# Patient Record
Sex: Female | Born: 1960 | Race: White | Hispanic: No | Marital: Married | Smoking: Never smoker
Health system: Southern US, Community
[De-identification: ages and names within clinical notes are randomized; demographics above are authoritative.]

## PROBLEM LIST (undated history)

## (undated) DIAGNOSIS — H539 Unspecified visual disturbance: Secondary | ICD-10-CM

## (undated) DIAGNOSIS — A6920 Lyme disease, unspecified: Secondary | ICD-10-CM

## (undated) DIAGNOSIS — G44009 Cluster headache syndrome, unspecified, not intractable: Secondary | ICD-10-CM

## (undated) DIAGNOSIS — G709 Myoneural disorder, unspecified: Secondary | ICD-10-CM

## (undated) DIAGNOSIS — G35 Multiple sclerosis: Secondary | ICD-10-CM

## (undated) DIAGNOSIS — M1990 Unspecified osteoarthritis, unspecified site: Secondary | ICD-10-CM

## (undated) DIAGNOSIS — G35D Multiple sclerosis, unspecified: Secondary | ICD-10-CM

## (undated) DIAGNOSIS — K219 Gastro-esophageal reflux disease without esophagitis: Secondary | ICD-10-CM

## (undated) DIAGNOSIS — H269 Unspecified cataract: Secondary | ICD-10-CM

## (undated) DIAGNOSIS — D649 Anemia, unspecified: Secondary | ICD-10-CM

## (undated) DIAGNOSIS — E039 Hypothyroidism, unspecified: Secondary | ICD-10-CM

## (undated) HISTORY — DX: Multiple sclerosis: G35

## (undated) HISTORY — DX: Lyme disease, unspecified: A69.20

## (undated) HISTORY — DX: Multiple sclerosis, unspecified: G35.D

## (undated) HISTORY — DX: Hypothyroidism, unspecified: E03.9

## (undated) HISTORY — DX: Gastro-esophageal reflux disease without esophagitis: K21.9

## (undated) HISTORY — DX: Unspecified osteoarthritis, unspecified site: M19.90

## (undated) HISTORY — DX: Cluster headache syndrome, unspecified, not intractable: G44.009

## (undated) HISTORY — PX: TUBAL LIGATION: SHX77

## (undated) HISTORY — DX: Unspecified visual disturbance: H53.9

## (undated) HISTORY — DX: Unspecified cataract: H26.9

---

## 1994-01-09 HISTORY — PX: KNEE ARTHROSCOPY: SUR90

## 1997-11-30 ENCOUNTER — Other Ambulatory Visit: Admission: RE | Admit: 1997-11-30 | Discharge: 1997-11-30 | Payer: Self-pay | Admitting: Gynecology

## 2000-01-10 HISTORY — PX: BREAST SURGERY: SHX581

## 2001-01-09 HISTORY — PX: AUGMENTATION MAMMAPLASTY: SUR837

## 2001-09-05 ENCOUNTER — Other Ambulatory Visit: Admission: RE | Admit: 2001-09-05 | Discharge: 2001-09-05 | Payer: Self-pay | Admitting: Gynecology

## 2003-03-02 ENCOUNTER — Other Ambulatory Visit: Admission: RE | Admit: 2003-03-02 | Discharge: 2003-03-02 | Payer: Self-pay | Admitting: Gynecology

## 2003-03-30 ENCOUNTER — Ambulatory Visit (HOSPITAL_COMMUNITY): Admission: RE | Admit: 2003-03-30 | Discharge: 2003-03-30 | Payer: Self-pay | Admitting: Gynecology

## 2004-07-05 ENCOUNTER — Other Ambulatory Visit: Admission: RE | Admit: 2004-07-05 | Discharge: 2004-07-05 | Payer: Self-pay | Admitting: Gynecology

## 2005-01-31 ENCOUNTER — Encounter (INDEPENDENT_AMBULATORY_CARE_PROVIDER_SITE_OTHER): Payer: Self-pay | Admitting: Specialist

## 2005-01-31 ENCOUNTER — Ambulatory Visit (HOSPITAL_COMMUNITY): Admission: RE | Admit: 2005-01-31 | Discharge: 2005-01-31 | Payer: Self-pay | Admitting: Neurology

## 2005-02-02 ENCOUNTER — Encounter: Admission: RE | Admit: 2005-02-02 | Discharge: 2005-02-02 | Payer: Self-pay | Admitting: Neurology

## 2005-07-06 ENCOUNTER — Other Ambulatory Visit: Admission: RE | Admit: 2005-07-06 | Discharge: 2005-07-06 | Payer: Self-pay | Admitting: Gynecology

## 2005-10-02 ENCOUNTER — Encounter: Admission: RE | Admit: 2005-10-02 | Discharge: 2005-10-02 | Payer: Self-pay | Admitting: Gynecology

## 2006-01-09 HISTORY — PX: COLONOSCOPY: SHX174

## 2006-03-03 ENCOUNTER — Encounter: Admission: RE | Admit: 2006-03-03 | Discharge: 2006-03-03 | Payer: Self-pay | Admitting: Neurology

## 2007-02-18 ENCOUNTER — Other Ambulatory Visit: Admission: RE | Admit: 2007-02-18 | Discharge: 2007-02-18 | Payer: Self-pay | Admitting: Gynecology

## 2007-05-07 ENCOUNTER — Encounter: Admission: RE | Admit: 2007-05-07 | Discharge: 2007-05-07 | Payer: Self-pay | Admitting: Gynecology

## 2007-05-17 ENCOUNTER — Encounter: Admission: RE | Admit: 2007-05-17 | Discharge: 2007-05-17 | Payer: Self-pay | Admitting: Gynecology

## 2007-11-14 ENCOUNTER — Ambulatory Visit: Payer: Self-pay | Admitting: Gynecology

## 2008-05-04 ENCOUNTER — Ambulatory Visit: Payer: Self-pay | Admitting: Gynecology

## 2008-05-04 ENCOUNTER — Other Ambulatory Visit: Admission: RE | Admit: 2008-05-04 | Discharge: 2008-05-04 | Payer: Self-pay | Admitting: Gynecology

## 2008-05-04 ENCOUNTER — Encounter: Payer: Self-pay | Admitting: Gynecology

## 2008-05-08 ENCOUNTER — Ambulatory Visit: Payer: Self-pay | Admitting: Gynecology

## 2008-05-18 ENCOUNTER — Ambulatory Visit: Payer: Self-pay | Admitting: Gynecology

## 2008-06-02 ENCOUNTER — Ambulatory Visit: Payer: Self-pay | Admitting: Gynecology

## 2008-06-26 ENCOUNTER — Ambulatory Visit: Payer: Self-pay | Admitting: Gynecology

## 2008-07-03 ENCOUNTER — Encounter: Admission: RE | Admit: 2008-07-03 | Discharge: 2008-07-03 | Payer: Self-pay | Admitting: *Deleted

## 2008-07-20 ENCOUNTER — Ambulatory Visit: Payer: Self-pay | Admitting: Gynecology

## 2008-08-10 ENCOUNTER — Encounter: Payer: Self-pay | Admitting: Infectious Diseases

## 2008-08-28 ENCOUNTER — Encounter: Admission: RE | Admit: 2008-08-28 | Discharge: 2008-08-28 | Payer: Self-pay | Admitting: Gynecology

## 2008-09-01 ENCOUNTER — Ambulatory Visit: Payer: Self-pay | Admitting: Infectious Diseases

## 2008-09-01 DIAGNOSIS — N760 Acute vaginitis: Secondary | ICD-10-CM | POA: Insufficient documentation

## 2008-09-01 DIAGNOSIS — G35 Multiple sclerosis: Secondary | ICD-10-CM | POA: Insufficient documentation

## 2008-09-01 DIAGNOSIS — G35D Multiple sclerosis, unspecified: Secondary | ICD-10-CM | POA: Insufficient documentation

## 2008-09-01 LAB — CONVERTED CEMR LAB
BUN: 10 mg/dL (ref 6–23)
Basophils Absolute: 0 10*3/uL (ref 0.0–0.1)
Basophils Relative: 1 % (ref 0–1)
Calcium: 9.4 mg/dL (ref 8.4–10.5)
Eosinophils Relative: 1 % (ref 0–5)
Glucose, Bld: 94 mg/dL (ref 70–99)
Hemoglobin, Urine: NEGATIVE
Hemoglobin: 12.8 g/dL (ref 12.0–15.0)
Ketones, ur: NEGATIVE mg/dL
MCHC: 32.5 g/dL (ref 30.0–36.0)
Monocytes Absolute: 0.7 10*3/uL (ref 0.1–1.0)
Neutro Abs: 2.7 10*3/uL (ref 1.7–7.7)
Nitrite: NEGATIVE
Platelets: 258 10*3/uL (ref 150–400)
RDW: 14.5 % (ref 11.5–15.5)
Sodium: 141 meq/L (ref 135–145)
Urobilinogen, UA: 0.2 (ref 0.0–1.0)

## 2008-09-02 ENCOUNTER — Encounter: Admission: RE | Admit: 2008-09-02 | Discharge: 2008-09-02 | Payer: Self-pay | Admitting: Gynecology

## 2008-09-08 ENCOUNTER — Telehealth: Payer: Self-pay | Admitting: Infectious Diseases

## 2010-01-30 ENCOUNTER — Encounter: Payer: Self-pay | Admitting: Gynecology

## 2010-01-31 ENCOUNTER — Encounter: Payer: Self-pay | Admitting: *Deleted

## 2010-05-27 NOTE — H&P (Signed)
Samantha Church, Samantha Church                  ACCOUNT NO.:  [id number]   MEDICAL RECORD NO.:  [id number]          PATIENT TYPE:  OUT   LOCATION:  [facility]                          FACILITY:  [facility]   PHYSICIAN:  Catherine A. Orlin Hilding, M.D.DATE OF BIRTH:  Jul 19, 1960   DATE OF ADMISSION:  01/31/2005  DATE OF DISCHARGE:                                HISTORY & PHYSICAL   PROCEDURE:  Lumbar puncture.   CHIEF COMPLAINT:  Numbness.   HISTORY OF PRESENT ILLNESS:  [name] is a 50 year old right-handed white  woman with right-sided numbness. She had an MRI scan of the brain which  showed a few scattered lesions and MRI of the cervical spine which showed a  left posterolateral cord lesion at C3-4, question of MS versus syrinx versus  tumor. MRI of the thoracic spine was normal. The primary concern is MS.   PAST MEDICAL HISTORY:  Unremarkable. She takes no medicines routinely.   ALLERGIES:  None.   SOCIAL HISTORY:  She is married with children.   FAMILY HISTORY:  Noncontributory.   EXAMINATION:  She is awake and alert, appropriate, with normal cranial  nerves, normal motor exam. Reflexes are normal, coordination is normal. She  has decreased sensation in the right torso to pinprick.   IMPRESSION:  Sensory loss in the right thorax with abnormal MRI in the brain  and cervical spine suspicious for multiple sclerosis; also need to rule out  tumor.   PLAN:  Lumbar puncture for CSF.   HOSPITAL COURSE:  The patient had the procedure without any complications  and is discharged home after 30 minutes in Trendelenburg.      Catherine A. Orlin Hilding, M.D.  Electronically Signed     CAW/MEDQ  D:  01/31/2005  T:  01/31/2005  Job:  [id number]

## 2010-05-27 NOTE — Op Note (Signed)
Samantha Church, Samantha Church                  ACCOUNT NO.:  [id number]   MEDICAL RECORD NO.:  [id number]          PATIENT TYPE:  OUT   LOCATION:  [facility]                          FACILITY:  [facility]   PHYSICIAN:  Catherine A. Orlin Hilding, M.D.DATE OF BIRTH:  03-27-1960   DATE OF PROCEDURE:  01/31/2005  DATE OF DISCHARGE:                                 OPERATIVE REPORT   PROCEDURE:  Lumbar puncture.   INDICATIONS FOR PROCEDURE:  Diagnostic.   The procedure was explained along with risks of headache and potential  benefits of a possible diagnosis.  Consent was obtained.  The patient was  sterilely prepped and draped and positioned in the usual fashion in the left  lateral decubitus position, anesthetized at the L3-L4 level with 1%  Xylocaine.  The thecal sac was entered without difficulty.  Opening pressure  was 16 Tor.  Approximately 14 mL clear, colorless fluid was obtained and  sent to the lab  for cell count, differential, protein, glucose, VDRL, ACE  level, cytology, gram stain, cultures, oligoclonal bands, and IGG albumin  index.  The patient tolerated the procedure well without any immediate  complications.  She is to lie in Trendelenburg for 30 minutes and then may  be discharged home.      Catherine A. Orlin Hilding, M.D.  Electronically Signed     CAW/MEDQ  D:  01/31/2005  T:  01/31/2005  Job:  [id number]

## 2010-10-20 ENCOUNTER — Other Ambulatory Visit: Payer: Self-pay | Admitting: Obstetrics and Gynecology

## 2010-10-20 DIAGNOSIS — N644 Mastodynia: Secondary | ICD-10-CM

## 2010-11-02 ENCOUNTER — Ambulatory Visit
Admission: RE | Admit: 2010-11-02 | Discharge: 2010-11-02 | Disposition: A | Discharge status: Home / Self Care | Payer: BC Managed Care – PPO | Source: Ambulatory Visit | Attending: Obstetrics and Gynecology | Admitting: Obstetrics and Gynecology

## 2010-11-02 ENCOUNTER — Other Ambulatory Visit: Payer: Self-pay | Admitting: Obstetrics and Gynecology

## 2010-11-02 DIAGNOSIS — N644 Mastodynia: Secondary | ICD-10-CM

## 2010-11-24 LAB — BASIC METABOLIC PANEL
BUN: 10 (ref 4–21)
Creatinine: 0.8 (ref 0.5–1.1)
GLUCOSE: 83

## 2010-11-24 LAB — LIPID PANEL
Cholesterol: 190 (ref 0–200)
HDL: 52 (ref 35–70)
LDL CALC: 138
LDL/HDL RATIO: 3.7
TRIGLYCERIDES: 69 (ref 40–160)

## 2010-11-24 LAB — HEPATIC FUNCTION PANEL
ALK PHOS: 41 (ref 25–125)
ALT: 14 (ref 7–35)
AST: 16 (ref 13–35)
BILIRUBIN, TOTAL: 0.6

## 2010-11-24 LAB — CBC AND DIFFERENTIAL
HEMATOCRIT: 38 (ref 36–46)
HEMOGLOBIN: 13.2 (ref 12.0–16.0)
Platelets: 217 (ref 150–399)
WBC: 7.4

## 2010-11-24 LAB — TSH: TSH: 1.87 (ref 0.41–5.90)

## 2011-07-17 ENCOUNTER — Ambulatory Visit (HOSPITAL_COMMUNITY)
Admission: RE | Admit: 2011-07-17 | Discharge: 2011-07-17 | Disposition: A | Discharge status: Home / Self Care | Payer: BC Managed Care – PPO | Source: Ambulatory Visit | Attending: Gastroenterology | Admitting: Gastroenterology

## 2011-07-17 ENCOUNTER — Other Ambulatory Visit: Payer: Self-pay | Admitting: Gastroenterology

## 2011-07-17 DIAGNOSIS — Q438 Other specified congenital malformations of intestine: Secondary | ICD-10-CM

## 2011-07-17 DIAGNOSIS — K562 Volvulus: Secondary | ICD-10-CM | POA: Insufficient documentation

## 2012-03-08 ENCOUNTER — Other Ambulatory Visit: Payer: Self-pay

## 2012-03-08 DIAGNOSIS — Z1231 Encounter for screening mammogram for malignant neoplasm of breast: Secondary | ICD-10-CM

## 2012-04-03 ENCOUNTER — Ambulatory Visit
Admission: RE | Admit: 2012-04-03 | Discharge: 2012-04-03 | Disposition: A | Discharge status: Home / Self Care | Payer: 59 | Source: Ambulatory Visit

## 2012-04-03 DIAGNOSIS — Z1231 Encounter for screening mammogram for malignant neoplasm of breast: Secondary | ICD-10-CM

## 2012-09-27 ENCOUNTER — Encounter (HOSPITAL_COMMUNITY): Payer: Self-pay | Admitting: Pharmacy Technician

## 2012-10-07 ENCOUNTER — Encounter (HOSPITAL_COMMUNITY)
Admission: RE | Admit: 2012-10-07 | Discharge: 2012-10-07 | Disposition: A | Discharge status: Home / Self Care | Payer: 59 | Source: Ambulatory Visit | Attending: Obstetrics and Gynecology | Admitting: Obstetrics and Gynecology

## 2012-10-07 ENCOUNTER — Encounter (HOSPITAL_COMMUNITY): Payer: Self-pay

## 2012-10-07 HISTORY — DX: Myoneural disorder, unspecified: G70.9

## 2012-10-07 HISTORY — DX: Anemia, unspecified: D64.9

## 2012-10-07 LAB — CBC
HCT: 37.7 % (ref 36.0–46.0)
MCH: 29.8 pg (ref 26.0–34.0)
MCV: 87.9 fL (ref 78.0–100.0)
Platelets: 219 10*3/uL (ref 150–400)
RBC: 4.29 MIL/uL (ref 3.87–5.11)

## 2012-10-07 NOTE — H&P (Addendum)
53 yo presents for surgical mngt of irregular vb.  Suspect endometrial mass by [place].  PMHx:  Hypothyroidism, MS PSHx:  SVD x 4 All:  NKA Meds:  Tecfidera, vitamins, levothyroid SHx:  Negative tobacco  AF, VSS Gen - NAD ABd - soft, NT CV - RRR Lungs - clear PV - uterus mobile, NT.  No adnexal tenderness  [place]:  Ultrasound today shows a 2.3 cm mass within the endometrial cavity.  Resolving right ovarian cyst is noted and a small amount of fluid noted in the cul-de-sac.  A/P:  Irregular VB Hysteroscopy, D&C, resection of endometrial mass R/b/a discussed, informed consent

## 2012-10-07 NOTE — Patient Instructions (Addendum)
Your procedure is scheduled on:10/09/12  Enter through the Main Entrance at :6am Pick up desk phone and dial [phone] and inform us of your arrival.  Please call [phone] if you have any problems the morning of surgery.  Remember: Do not eat food or drink liquids, including water, after midnight:Tuesday   You may brush your teeth the morning of surgery.   DO NOT wear jewelry, eye make-up, lipstick,body lotion, or dark fingernail polish.  (Polished toes are ok) You may wear deodorant.   Patients discharged on the day of surgery will not be allowed to drive home. Wear loose fitting, comfortable clothes for your ride home.

## 2012-10-08 MED ORDER — DEXTROSE 5 % IV SOLN
2.0000 g | INTRAVENOUS | Status: AC
  Administered 2012-10-09: 2 g via INTRAVENOUS
  Filled 2012-10-08: qty 2

## 2012-10-09 ENCOUNTER — Encounter (HOSPITAL_COMMUNITY): Payer: Self-pay | Admitting: Anesthesiology

## 2012-10-09 ENCOUNTER — Encounter (HOSPITAL_COMMUNITY)
Admission: RE | Disposition: A | Discharge status: Home / Self Care | Payer: Self-pay | Source: Ambulatory Visit | Attending: Obstetrics and Gynecology

## 2012-10-09 ENCOUNTER — Ambulatory Visit (HOSPITAL_COMMUNITY)
Admission: RE | Admit: 2012-10-09 | Discharge: 2012-10-09 | Disposition: A | Discharge status: Home / Self Care | Payer: 59 | Source: Ambulatory Visit | Attending: Obstetrics and Gynecology | Admitting: Obstetrics and Gynecology

## 2012-10-09 ENCOUNTER — Ambulatory Visit (HOSPITAL_COMMUNITY): Payer: 59 | Admitting: Anesthesiology

## 2012-10-09 DIAGNOSIS — N938 Other specified abnormal uterine and vaginal bleeding: Secondary | ICD-10-CM | POA: Insufficient documentation

## 2012-10-09 DIAGNOSIS — N840 Polyp of corpus uteri: Secondary | ICD-10-CM | POA: Insufficient documentation

## 2012-10-09 DIAGNOSIS — N949 Unspecified condition associated with female genital organs and menstrual cycle: Secondary | ICD-10-CM | POA: Insufficient documentation

## 2012-10-09 HISTORY — PX: DILATATION & CURRETTAGE/HYSTEROSCOPY WITH RESECTOCOPE: SHX5572

## 2012-10-09 SURGERY — DILATATION & CURETTAGE/HYSTEROSCOPY WITH RESECTOCOPE
Anesthesia: General | Site: Uterus | Wound class: Clean Contaminated

## 2012-10-09 MED ORDER — FENTANYL CITRATE 0.05 MG/ML IJ SOLN
INTRAMUSCULAR | Status: DC | PRN
  Administered 2012-10-09: 100 ug via INTRAVENOUS

## 2012-10-09 MED ORDER — LACTATED RINGERS IV SOLN
INTRAVENOUS | Status: DC
  Administered 2012-10-09: 07:00:00 via INTRAVENOUS
  Administered 2012-10-09: 1000 mL via INTRAVENOUS

## 2012-10-09 MED ORDER — HYDROCODONE-IBUPROFEN 7.5-200 MG PO TABS: 1.0000 | ORAL_TABLET | Freq: Three times a day (TID) | ORAL | Status: DC | PRN

## 2012-10-09 MED ORDER — BUPIVACAINE HCL (PF) 0.5 % IJ SOLN
INTRAMUSCULAR | Status: DC | PRN
  Administered 2012-10-09: 8 mL

## 2012-10-09 MED ORDER — ONDANSETRON HCL 4 MG/2ML IJ SOLN
INTRAMUSCULAR | Status: DC | PRN
  Administered 2012-10-09: 4 mg via INTRAVENOUS

## 2012-10-09 MED ORDER — LIDOCAINE HCL (CARDIAC) 20 MG/ML IV SOLN
INTRAVENOUS | Status: DC | PRN
  Administered 2012-10-09: 60 mg via INTRAVENOUS

## 2012-10-09 MED ORDER — PROPOFOL 10 MG/ML IV BOLUS
INTRAVENOUS | Status: DC | PRN
  Administered 2012-10-09: 150 mg via INTRAVENOUS

## 2012-10-09 MED ORDER — LIDOCAINE HCL (CARDIAC) 20 MG/ML IV SOLN
INTRAVENOUS | Status: AC
  Filled 2012-10-09: qty 5

## 2012-10-09 MED ORDER — GLYCINE 1.5 % IR SOLN
Status: DC | PRN
  Administered 2012-10-09: 3000 mL

## 2012-10-09 MED ORDER — FENTANYL CITRATE 0.05 MG/ML IJ SOLN
INTRAMUSCULAR | Status: AC
  Administered 2012-10-09: 50 ug via INTRAVENOUS
  Filled 2012-10-09: qty 2

## 2012-10-09 MED ORDER — BUPIVACAINE HCL (PF) 0.5 % IJ SOLN
INTRAMUSCULAR | Status: AC
  Filled 2012-10-09: qty 30

## 2012-10-09 MED ORDER — MIDAZOLAM HCL 5 MG/5ML IJ SOLN
INTRAMUSCULAR | Status: DC | PRN
  Administered 2012-10-09: 2 mg via INTRAVENOUS

## 2012-10-09 MED ORDER — DEXAMETHASONE SODIUM PHOSPHATE 10 MG/ML IJ SOLN
INTRAMUSCULAR | Status: AC
  Filled 2012-10-09: qty 1

## 2012-10-09 MED ORDER — METOCLOPRAMIDE HCL 5 MG/ML IJ SOLN: 10.0000 mg | Freq: Once | INTRAMUSCULAR | Status: DC | PRN

## 2012-10-09 MED ORDER — PROPOFOL 10 MG/ML IV EMUL
INTRAVENOUS | Status: AC
  Filled 2012-10-09: qty 20

## 2012-10-09 MED ORDER — MIDAZOLAM HCL 2 MG/2ML IJ SOLN
INTRAMUSCULAR | Status: AC
  Filled 2012-10-09: qty 2

## 2012-10-09 MED ORDER — KETOROLAC TROMETHAMINE 30 MG/ML IJ SOLN
INTRAMUSCULAR | Status: DC | PRN
  Administered 2012-10-09: 30 mg via INTRAVENOUS

## 2012-10-09 MED ORDER — DEXAMETHASONE SODIUM PHOSPHATE 4 MG/ML IJ SOLN
INTRAMUSCULAR | Status: DC | PRN
  Administered 2012-10-09: 10 mg via INTRAVENOUS

## 2012-10-09 MED ORDER — KETOROLAC TROMETHAMINE 30 MG/ML IJ SOLN: 15.0000 mg | Freq: Once | INTRAMUSCULAR | Status: DC | PRN

## 2012-10-09 MED ORDER — ONDANSETRON HCL 4 MG/2ML IJ SOLN
INTRAMUSCULAR | Status: AC
  Filled 2012-10-09: qty 2

## 2012-10-09 MED ORDER — FENTANYL CITRATE 0.05 MG/ML IJ SOLN
INTRAMUSCULAR | Status: AC
  Filled 2012-10-09: qty 2

## 2012-10-09 MED ORDER — SODIUM CHLORIDE 0.9 % IR SOLN
Status: DC | PRN
  Administered 2012-10-09: 3000 mL

## 2012-10-09 MED ORDER — FENTANYL CITRATE 0.05 MG/ML IJ SOLN
25.0000 ug | INTRAMUSCULAR | Status: DC | PRN
  Administered 2012-10-09: 50 ug via INTRAVENOUS

## 2012-10-09 SURGICAL SUPPLY — 20 items
ABLATOR ENDOMETRIAL BIPOLAR (ABLATOR) IMPLANT
CANISTER SUCTION 2500CC (MISCELLANEOUS) ×2 IMPLANT
CATH ROBINSON RED A/P 16FR (CATHETERS) ×2 IMPLANT
CATH THERMACHOICE III (CATHETERS) IMPLANT
CLOTH BEACON ORANGE TIMEOUT ST (SAFETY) ×2 IMPLANT
CONTAINER PREFILL 10% NBF 60ML (FORM) ×4 IMPLANT
DRESSING TELFA 8X3 (GAUZE/BANDAGES/DRESSINGS) ×2 IMPLANT
ELECT REM PT RETURN 9FT ADLT (ELECTROSURGICAL) ×2
ELECTRODE REM PT RTRN 9FT ADLT (ELECTROSURGICAL) ×1 IMPLANT
ELECTRODE RT ANGLE VERSAPOINT (CUTTING LOOP) ×2 IMPLANT
GAUZE SPONGE 4X4 16PLY XRAY LF (GAUZE/BANDAGES/DRESSINGS) ×1 IMPLANT
GLOVE BIO SURGEON STRL SZ 6.5 (GLOVE) ×2 IMPLANT
GLOVE BIOGEL PI IND STRL 7.0 (GLOVE) ×1 IMPLANT
GLOVE BIOGEL PI INDICATOR 7.0 (GLOVE) ×1
GOWN STRL REIN XL XLG (GOWN DISPOSABLE) ×4 IMPLANT
LOOP ANGLED CUTTING 22FR (CUTTING LOOP) ×1 IMPLANT
PACK HYSTEROSCOPY LF (CUSTOM PROCEDURE TRAY) ×2 IMPLANT
PAD OB MATERNITY 4.3X12.25 (PERSONAL CARE ITEMS) ×2 IMPLANT
TOWEL OR 17X24 6PK STRL BLUE (TOWEL DISPOSABLE) ×4 IMPLANT
WATER STERILE IRR 1000ML POUR (IV SOLUTION) ×2 IMPLANT

## 2012-10-09 NOTE — Anesthesia Preprocedure Evaluation (Signed)
Anesthesia Evaluation  Patient identified by MRN, date of birth, ID band Patient awake    Reviewed: Allergy & Precautions, H&P , NPO status , Patient's Chart, lab work & pertinent test results, reviewed documented beta blocker date and time   History of Anesthesia Complications Negative for: history of anesthetic complications  Airway Mallampati: I TM Distance: >3 FB Neck ROM: full    Dental  (+) Teeth Intact   Pulmonary neg pulmonary ROS,  breath sounds clear to auscultation  Pulmonary exam normal       Cardiovascular Exercise Tolerance: Good negative cardio ROS  Rhythm:regular Rate:Normal     Neuro/Psych On low dose naltrexone to "calm inflammation"   Neuromuscular disease (multiple sclerosis - currently only symptom is leg stiffness, fatigue (on tecfidera)) negative psych ROS   GI/Hepatic negative GI ROS, Neg liver ROS,   Endo/Other  negative endocrine ROSHypothyroidism (on cytomel for one year)   Renal/GU negative Renal ROS  Female GU complaint     Musculoskeletal   Abdominal   Peds  Hematology negative hematology ROS (+)   Anesthesia Other Findings   Reproductive/Obstetrics negative OB ROS                           Anesthesia Physical Anesthesia Plan  ASA: II  Anesthesia Plan: General LMA   Post-op Pain Management:    Induction:   Airway Management Planned:   Additional Equipment:   Intra-op Plan:   Post-operative Plan:   Informed Consent: I have reviewed the patients History and Physical, chart, labs and discussed the procedure including the risks, benefits and alternatives for the proposed anesthesia with the patient or authorized representative who has indicated his/her understanding and acceptance.   Dental Advisory Given  Plan Discussed with: CRNA and Surgeon  Anesthesia Plan Comments:         Anesthesia Quick Evaluation

## 2012-10-09 NOTE — Transfer of Care (Signed)
Immediate Anesthesia Transfer of Care Note  Patient: Samantha Church  Procedure(s) Performed: Procedure(s) with comments: DILATATION & CURETTAGE/HYSTEROSCOPY WITH RESECTOCOPE (N/A) - RESECTION with Versapoint   Patient Location: PACU  Anesthesia Type:General  Level of Consciousness: awake, alert  and oriented  Airway & Oxygen Therapy: Patient Spontanous Breathing and Patient connected to nasal cannula oxygen  Post-op Assessment: Report given to PACU RN and Post -op Vital signs reviewed and stable  Post vital signs: Reviewed and stable  Complications: No apparent anesthesia complications

## 2012-10-09 NOTE — Anesthesia Postprocedure Evaluation (Signed)
  Anesthesia Post-op Note  Patient: Samantha Church  Procedure(s) Performed: Procedure(s) with comments: DILATATION & CURETTAGE/HYSTEROSCOPY WITH RESECTOCOPE (N/A) - RESECTION with Versapoint   Patient Location: PACU  Anesthesia Type:General  Level of Consciousness: awake, alert  and oriented  Airway and Oxygen Therapy: Patient Spontanous Breathing  Post-op Pain: mild  Post-op Assessment: Post-op Vital signs reviewed, Patient's Cardiovascular Status Stable, Respiratory Function Stable, Patent Airway, No signs of Nausea or vomiting and Adequate PO intake  Post-op Vital Signs: Reviewed and stable  Complications: No apparent anesthesia complications

## 2012-10-10 ENCOUNTER — Encounter (HOSPITAL_COMMUNITY): Payer: Self-pay | Admitting: Obstetrics and Gynecology

## 2012-10-10 NOTE — Op Note (Signed)
NAMECHIFFON, KITTLESON NO.:  [id number]  MEDICAL RECORD NO.:  [id number]  LOCATION:  [facility]                          FACILITY:  [facility]  PHYSICIAN:  Zelphia Cairo, MD    DATE OF BIRTH:  1960-09-02  DATE OF PROCEDURE: DATE OF DISCHARGE:  10/09/2012                              OPERATIVE REPORT   PREOPERATIVE DIAGNOSES: 1. Irregular vaginal bleeding. 2. Endometrial mass.  POSTOPERATIVE DIAGNOSES: 1. Irregular vaginal bleeding. 2. Endometrial mass, path pending.  PROCEDURE: 1. Cervical block. 2. Hysteroscopy. 3. D and C. 4. Resection of endometrial polyp.  SURGEON:  Zelphia Cairo, MD  ANESTHESIA:  General.  COMPLICATIONS:  None.  CONDITION:  Stable to recovery room.  PROCEDURE IN DETAIL:  The patient was taken to the operating room. After informed consent was obtained, she was placed in the dorsal lithotomy position using Allen stirrups, given general anesthesia, and prepped and draped in sterile fashion.  In- and out catheter was used to drain her bladder for an unmeasured amount of urine.  Bivalve speculum was placed in the vagina and 1 mL of 0.25% Marcaine was injected into the 12 o'clock position of the cervix.  The remaining 9 mL was used to perform a cervical block.  Single-tooth tenaculum was attached to the anterior lip of the cervix and the cervix was serially dilated using Pratt dilators.  Diagnostic hysteroscope was inserted and a survey was performed.  A large polypoid appearing mass was noted to consume the endometrial cavity.  The hysteroscope was removed and the myoma graspers were used to grasp and resect the endometrial polyp.  Once the polyp was debulked to the base, the VersaPoint was inserted into the endometrial cavity.  The cavity was flushed for 30 seconds with normal saline and the base of the polyp was resected under direct visualization.  The cavity was then found to be free of any abnormalities or masses, a gentle curetting  was then performed throughout.  Specimens were placed on Telfa and passed off to be sent to Pathology.  Tenaculum was removed from the cervix.  The cervix was hemostatic.  Sponge, lap, needle, and instrument counts were correct x2.  She was taken to the recovery room in stable condition.     Zelphia Cairo, MD     GA/MEDQ  D:  10/09/2012  T:  10/10/2012  Job:  [id number]

## 2012-11-14 ENCOUNTER — Other Ambulatory Visit: Payer: Self-pay

## 2013-04-25 ENCOUNTER — Other Ambulatory Visit: Payer: Self-pay

## 2013-04-25 DIAGNOSIS — Z1231 Encounter for screening mammogram for malignant neoplasm of breast: Secondary | ICD-10-CM

## 2013-04-28 ENCOUNTER — Ambulatory Visit
Admission: RE | Admit: 2013-04-28 | Discharge: 2013-04-28 | Disposition: A | Discharge status: Home / Self Care | Payer: 59 | Source: Ambulatory Visit

## 2013-04-28 DIAGNOSIS — Z1231 Encounter for screening mammogram for malignant neoplasm of breast: Secondary | ICD-10-CM

## 2014-06-22 ENCOUNTER — Other Ambulatory Visit: Payer: Self-pay | Admitting: Obstetrics and Gynecology

## 2014-06-23 LAB — CYTOLOGY - PAP

## 2014-11-03 ENCOUNTER — Encounter: Payer: Self-pay | Admitting: *Deleted

## 2014-11-03 ENCOUNTER — Encounter: Payer: Self-pay | Admitting: Neurology

## 2014-11-03 ENCOUNTER — Ambulatory Visit (INDEPENDENT_AMBULATORY_CARE_PROVIDER_SITE_OTHER): Payer: 59 | Admitting: Neurology

## 2014-11-03 VITALS — BP 108/68 | HR 64 | Resp 14 | Ht 66.0 in | Wt 132.6 lb

## 2014-11-03 DIAGNOSIS — R269 Unspecified abnormalities of gait and mobility: Secondary | ICD-10-CM | POA: Insufficient documentation

## 2014-11-03 DIAGNOSIS — R5383 Other fatigue: Secondary | ICD-10-CM

## 2014-11-03 DIAGNOSIS — E039 Hypothyroidism, unspecified: Secondary | ICD-10-CM | POA: Insufficient documentation

## 2014-11-03 DIAGNOSIS — R208 Other disturbances of skin sensation: Secondary | ICD-10-CM

## 2014-11-03 DIAGNOSIS — G35 Multiple sclerosis: Secondary | ICD-10-CM

## 2014-11-03 DIAGNOSIS — A6920 Lyme disease, unspecified: Secondary | ICD-10-CM | POA: Insufficient documentation

## 2014-11-03 MED ORDER — DIMETHYL FUMARATE 240 MG PO CPDR: 240.0000 mg | DELAYED_RELEASE_CAPSULE | Freq: Two times a day (BID) | ORAL | Status: DC

## 2014-11-03 NOTE — Progress Notes (Signed)
Last mri brain/c-spine on cd requested from [facility]/fim

## 2014-11-03 NOTE — Progress Notes (Signed)
[facility]  PATIENT: Samantha Church DOB: 1960-01-11  REFERRING DOCTOR OR PCP:  Donald Prose SOURCE: Patient and records from [facility] neurolog[facility].  _________________________________   HISTORICAL  CHIEF COMPLAINT:  Chief Complaint  Patient presents with  . Multiple Sclerosis    Sts. she continues to tolerate Tecfidera well.  Denies new or wrosening MS sx.  Also has a hx. of Lyme Dz.  Sts. she usually takes an hour long nap in the afternoons and this helps with fatigue./fim      HISTORY OF PRESENT ILLNESS:  Samantha Church is a 54 year old woman with multiple sclerosis who I have seen in the past at [facility].  MS:   She was diagnosed in 2007 after presenting with right sided numbness and mild clumsiness / allodynia.    MRI was consistent with MS and she was started with Betaseron.  She then switched to Copaxone but had exacerbations and switched to Tysabri.     She converted to Tysabri but became JCV Ab positive and switched to Gilenya.   Due to exacerbations on Gilenya, she switched back to Tysabri x one year and then switched to Tecfidera (in 2014).    She has tolerated Tecfidera well.   MRI of the brain performed October 2015 showed 1 focus not present on the December 2014 MRI.  Gait/strength/senation:   She denies any difficulty with strength or gait.   She has chronic dysesthesias and allodynia which have been stable.      Bladder/Bowel:   She denies any difficulty with urinary frequency or urgency.     Fatigue/sleep:   She still has fatigue and naps daily.    She feels very refreshed after a one hour nap and does better the rest of the day.    She falls asleep easily and stays  Asleep the whole night.  Mood/Cogntion:   She denies any depression or anxiety.   She has noted mild 'brain fog' and feels she is forgetful at times.   She has no verbal fluencies issues.     Lyme:   Last year, she had more fatigue and was diagnosed with Lyme disease late 2015.    She was diagnosed in [place].   She was on doxycycline initially and then was switched to other medications.    Fatigue has done better more recently.  REVIEW OF SYSTEMS: Constitutional: No fevers, chills, sweats, or change in appetite.   She has fatigue Eyes: No visual changes, double vision, eye pain Ear, nose and throat: No hearing loss, ear pain, nasal congestion, sore throat Cardiovascular: No chest pain, palpitations Respiratory: No shortness of breath at rest or with exertion.   No wheezes GastrointestinaI: No nausea, vomiting, diarrhea, abdominal pain, fecal incontinence Genitourinary: No dysuria, urinary retention or frequency.  No nocturia. Musculoskeletal: No neck pain, back pain Integumentary: No rash, pruritus, skin lesions Neurological: as above Psychiatric: No depression at this time.  No anxiety Endocrine: She has hypothyroidism.  No palpitations, diaphoresis, change in appetite, change in weigh or increased thirst Hematologic/Lymphatic: No anemia, purpura, petechiae. Allergic/Immunologic: No itchy/runny eyes, nasal congestion, recent allergic reactions, rashes  ALLERGIES: No Known Allergies  HOME MEDICATIONS:  Current outpatient prescriptions:  .  Ascorbic Acid 500 MG/15ML SYRP, Take by mouth., Disp: , Rfl:  .  Cholecalciferol (VITAMIN D3) 10000 UNITS capsule, Take 10,000 Units by mouth 4 (four) times a week., Disp: , Rfl:  .  Dimethyl Fumarate (TECFIDERA) 240 MG CPDR, Take 240 mg by mouth 2 (  two) times daily., Disp: , Rfl:  .  Omega-3 Fatty Acids (FISH OIL PO), Take 2 tablets by mouth 2 (two) times daily., Disp: , Rfl:  .  Probiotic Product (PROBIOTIC DAILY PO), Take 1 tablet by mouth at bedtime., Disp: , Rfl:  .  SYNTHROID 75 MCG tablet, TK 1 T PO D OES BEFORE BREAKFAST, Disp: , Rfl: 2  PAST MEDICAL HISTORY: Past Medical History  Diagnosis Date  . Neuromuscular disorder ([place])     MS  . Anemia   . Lyme disease   . Multiple sclerosis ([place])   . Vision  abnormalities     PAST SURGICAL HISTORY: Past Surgical History  Procedure Laterality Date  . Knee arthroscopy  1996  . Breast surgery    . Dilatation & currettage/hysteroscopy with resectocope N/A 10/09/2012    Procedure: DILATATION & CURETTAGE/HYSTEROSCOPY WITH RESECTOCOPE;  Surgeon: [name], MD;  Location: [facility];  Service: Gynecology;  Laterality: N/A;  RESECTION with Versapoint     FAMILY HISTORY: Family History  Problem Relation Age of Onset  . Hypertension Mother   . Colon cancer Father     SOCIAL HISTORY:  Social History   Social History  . Marital Status: Married    Spouse Name: N/A  . Number of Children: N/A  . Years of Education: N/A   Occupational History  . Not on file.   Social History Main Topics  . Smoking status: Never Smoker   . Smokeless tobacco: Not on file  . Alcohol Use: Yes     Comment: 2x month  . Drug Use: No  . Sexual Activity: Not on file   Other Topics Concern  . Not on file   Social History Narrative     PHYSICAL EXAM  Filed Vitals:   11/03/14 1319  BP: 108/68  Pulse: 64  Resp: 14  Height: 5\' 6"  (1.676 m)  Weight: 132 lb 9.6 oz (60.147 kg)    Body mass index is 21.41 kg/(m^2).   General: The patient is well-developed and well-nourished and in no acute distress  Eyes:  Funduscopic exam shows normal optic discs and retinal vessels.  Neck: The neck is supple, no carotid bruits are noted.  The neck is nontender.  Cardiovascular: The heart has a regular rate and rhythm with a normal S1 and S2. There were no murmurs, gallops or rubs. Lungs are clear to auscultation.  Skin: Extremities are without significant edema.  Musculoskeletal:  Back is nontender  Neurologic Exam  Mental status: The patient is alert and oriented x 3 at the time of the examination. The patient has apparent normal recent and remote memory, with an apparently normal attention span and concentration ability.   Speech is normal.  Cranial  nerves: Extraocular movements are full. Pupils are equal, round, and reactive to light and accomodation.  Visual fields are full.  Facial symmetry is present. There is good facial sensation to soft touch bilaterally.Facial strength is normal.  Trapezius and sternocleidomastoid strength is normal. No dysarthria is noted.  The tongue is midline, and the patient has symmetric elevation of the soft palate. No obvious hearing deficits are noted.  Motor:  Muscle bulk is normal.   Tone is normal. Strength is  5 / 5 in all 4 extremities.   Sensory: Sensory testing is intact to pinprick, soft touch and vibration sensation in all 4 extremities.  Coordination: Cerebellar testing reveals good finger-nose-finger and slightly reduced heel-to-shin bilaterally.  Gait and station: Station is normal.   Gait  is normal. Tandem gait is wide. Romberg is negative.   Reflexes: Deep tendon reflexes are symmetric and brisk bilaterally.  No clonus.   Plantar responses are flexor.    DIAGNOSTIC DATA (LABS, IMAGING, TESTING) - I reviewed patient records, labs, notes, testing and imaging myself where available.  Lab Results  Component Value Date   WBC 4.4 10/07/2012   HGB 12.8 10/07/2012   HCT 37.7 10/07/2012   MCV 87.9 10/07/2012   PLT 219 10/07/2012      Component Value Date/Time   NA 141 09/01/2008 2213   K 4.5 09/01/2008 2213   CL 107 09/01/2008 2213   CO2 24 09/01/2008 2213   GLUCOSE 94 09/01/2008 2213   BUN 10 09/01/2008 2213   CREATININE 0.82 09/01/2008 2213   CALCIUM 9.4 09/01/2008 2213        ASSESSMENT AND PLAN  MULTIPLE SCLEROSIS - Plan: CBC with Differential/Platelet, Comprehensive metabolic panel, MR Brain W Wo Contrast  Dysesthesia  Other fatigue  Gait disturbance  1.   She will continue Tecfidera. We will check an MRI of the brain with and without contrast and compare with her prior study performed a [facility] last year to make sure that she is not having subclinical  progression. If this is occurring, we will need to consider another medication. 2.   Check blood work. If there is any lymphopenia or hepatotoxicity, consider dose reduction or change in therapy. 3.   She is advised to continue to be active and exercises as tolerated. 4.   Return in 6 months or sooner if there are new or worsening neurologic symptoms or based on the results of the MRI.   Richard A. Felecia Shelling, MD, PhD 88/32/5498, 2:64 PM Certified in Neurology, Clinical Neurophysiology, Sleep Medicine, Pain Medicine and Neuroimaging  [facility] [address], [place], [place] [address] [phone]

## 2014-11-04 LAB — CBC WITH DIFFERENTIAL/PLATELET
BASOS: 1 %
Basophils Absolute: 0 10*3/uL (ref 0.0–0.2)
EOS (ABSOLUTE): 0.1 10*3/uL (ref 0.0–0.4)
Eos: 1 %
HEMOGLOBIN: 12.8 g/dL (ref 11.1–15.9)
Hematocrit: 37.8 % (ref 34.0–46.6)
Immature Grans (Abs): 0 10*3/uL (ref 0.0–0.1)
Immature Granulocytes: 0 %
LYMPHS ABS: 1.1 10*3/uL (ref 0.7–3.1)
LYMPHS: 13 %
MCH: 31.3 pg (ref 26.6–33.0)
MCHC: 33.9 g/dL (ref 31.5–35.7)
MCV: 92 fL (ref 79–97)
MONOCYTES: 10 %
Monocytes Absolute: 0.9 10*3/uL (ref 0.1–0.9)
NEUTROS ABS: 6.6 10*3/uL (ref 1.4–7.0)
Neutrophils: 75 %
Platelets: 258 10*3/uL (ref 150–379)
RBC: 4.09 x10E6/uL (ref 3.77–5.28)
RDW: 14.1 % (ref 12.3–15.4)
WBC: 8.7 10*3/uL (ref 3.4–10.8)

## 2014-11-04 LAB — COMPREHENSIVE METABOLIC PANEL
ALBUMIN: 4.3 g/dL (ref 3.5–5.5)
ALK PHOS: 47 IU/L (ref 39–117)
ALT: 51 IU/L — ABNORMAL HIGH (ref 0–32)
AST: 45 IU/L — ABNORMAL HIGH (ref 0–40)
Albumin/Globulin Ratio: 2.2 (ref 1.1–2.5)
BUN / CREAT RATIO: 23 (ref 9–23)
BUN: 14 mg/dL (ref 6–24)
CHLORIDE: 105 mmol/L (ref 97–106)
CO2: 26 mmol/L (ref 18–29)
Calcium: 9.2 mg/dL (ref 8.7–10.2)
Creatinine, Ser: 0.61 mg/dL (ref 0.57–1.00)
GFR calc Af Amer: 120 mL/min/{1.73_m2} (ref >59)
GFR calc non Af Amer: 104 mL/min/{1.73_m2} (ref >59)
GLOBULIN, TOTAL: 2 g/dL (ref 1.5–4.5)
Glucose: 96 mg/dL (ref 65–99)
Potassium: 4.7 mmol/L (ref 3.5–5.2)
SODIUM: 144 mmol/L (ref 136–144)
TOTAL PROTEIN: 6.3 g/dL (ref 6.0–8.5)

## 2014-11-05 ENCOUNTER — Telehealth: Payer: Self-pay | Admitting: *Deleted

## 2014-11-05 NOTE — Telephone Encounter (Signed)
LMTC./fim 

## 2014-11-05 NOTE — Telephone Encounter (Signed)
-----   Message from Britt Bottom, MD sent at 11/04/2014  5:42 PM EDT ----- Labs look fine. The liver tests are  minimallyhigh ---- not enough to worry about

## 2014-11-05 NOTE — Telephone Encounter (Signed)
I have spoken with Samantha Church and per RAS, advised that labwork looks ok--lfts minimally high, but not enough to worry about.  She verbalized understanding of same/fim

## 2014-11-27 ENCOUNTER — Encounter: Payer: Self-pay | Admitting: Neurology

## 2014-12-02 ENCOUNTER — Ambulatory Visit (INDEPENDENT_AMBULATORY_CARE_PROVIDER_SITE_OTHER): Payer: 59

## 2014-12-02 DIAGNOSIS — G35 Multiple sclerosis: Secondary | ICD-10-CM | POA: Diagnosis not present

## 2014-12-02 MED ORDER — GADOPENTETATE DIMEGLUMINE 469.01 MG/ML IV SOLN: 13.0000 mL | Freq: Once | INTRAVENOUS | Status: DC | PRN

## 2014-12-07 ENCOUNTER — Telehealth: Payer: Self-pay | Admitting: *Deleted

## 2014-12-07 MED ORDER — GADOPENTETATE DIMEGLUMINE 469.01 MG/ML IV SOLN: 13.0000 mL | Freq: Once | INTRAVENOUS | Status: DC | PRN

## 2014-12-07 NOTE — Telephone Encounter (Signed)
I have spoken with Samantha Church this morning and per RAS, advised that mri brain shows no brand new lesions.  I have requested her last mri from [facility], and once RAS receives it and compares it with her most recent mri, will call her with results of comparison.  She verbalized understanding of same/fim

## 2014-12-07 NOTE — Telephone Encounter (Signed)
-----   Message from Britt Bottom, MD sent at 12/07/2014  8:34 AM EST ----- Please let her know that the MRI shows no brand new lesions.   We need to get her last one from [facility] to compare to see if anything has happened in the interim.

## 2014-12-15 ENCOUNTER — Encounter: Payer: Self-pay | Admitting: Neurology

## 2014-12-24 ENCOUNTER — Encounter: Payer: Self-pay | Admitting: Neurology

## 2015-02-04 LAB — HEPATIC FUNCTION PANEL
ALT: 37 — AB (ref 7–35)
AST: 29 (ref 13–35)
Alkaline Phosphatase: 33 (ref 25–125)
BILIRUBIN, TOTAL: 0.4

## 2015-02-04 LAB — BASIC METABOLIC PANEL
BUN: 12 (ref 4–21)
CREATININE: 0.5 (ref 0.5–1.1)
GLUCOSE: 88
POTASSIUM: 4.4 (ref 3.4–5.3)
SODIUM: 141 (ref 137–147)

## 2015-02-04 LAB — VITAMIN D 25 HYDROXY (VIT D DEFICIENCY, FRACTURES): Vit D, 25-Hydroxy: 48.2

## 2015-02-04 LAB — LIPID PANEL
CHOLESTEROL: 203 — AB (ref 0–200)
HDL: 92 — AB (ref 35–70)
LDL Cholesterol: 104
LDl/HDL Ratio: 2.2
TRIGLYCERIDES: 33 — AB (ref 40–160)

## 2015-03-12 ENCOUNTER — Encounter: Payer: Self-pay | Admitting: Neurology

## 2015-05-04 ENCOUNTER — Ambulatory Visit: Payer: 59 | Admitting: Neurology

## 2015-05-14 ENCOUNTER — Ambulatory Visit (INDEPENDENT_AMBULATORY_CARE_PROVIDER_SITE_OTHER): Payer: 59 | Admitting: Neurology

## 2015-05-14 ENCOUNTER — Encounter: Payer: Self-pay | Admitting: Neurology

## 2015-05-14 VITALS — BP 110/80 | HR 66 | Resp 14 | Ht 66.0 in | Wt 136.8 lb

## 2015-05-14 DIAGNOSIS — G35 Multiple sclerosis: Secondary | ICD-10-CM | POA: Diagnosis not present

## 2015-05-14 DIAGNOSIS — R208 Other disturbances of skin sensation: Secondary | ICD-10-CM | POA: Diagnosis not present

## 2015-05-14 DIAGNOSIS — R5383 Other fatigue: Secondary | ICD-10-CM

## 2015-05-14 DIAGNOSIS — R269 Unspecified abnormalities of gait and mobility: Secondary | ICD-10-CM

## 2015-05-14 NOTE — Progress Notes (Signed)
[facility]  PATIENT: Samantha Church DOB: 1960/05/04  REFERRING DOCTOR OR PCP:  Donald Prose SOURCE: Patient and records from [facility] neurology.  _________________________________   HISTORICAL  CHIEF COMPLAINT:  Chief Complaint  Patient presents with  . Multiple Sclerosis    Sts. she has more dizziness, balance issures, and numbness in feet since last ov.  Sts. continues to tolerate Tecfidera well/fim  . Hx. of Lyme Dz.    HISTORY OF PRESENT ILLNESS:  Samantha Church is a 55 year old woman with multiple sclerosis reporting more dysesthesia and gait issues.  MS:  She was diagnosed in 2007 after presenting with right sided numbness and mild clumsiness / allodynia.    MRI was consistent with MS and she was started with Betaseron.  She then switched to Copaxone but had exacerbations and switched to Tysabri.     She converted to Tysabri but became JCV Ab positive and switched to Gilenya.   Due to exacerbations on Gilenya, she switched back to Tysabri x one year and then switched to Tecfidera (in 2014).    She has tolerated Tecfidera well.   MRI of the brain performed October 2015 showed 1 focus not present on the December 2014 MRI.  Dysesthesia:   She had a sensation of a lump of cloth under her feet starting > a year ago that has progressed further in both feet.    Gait/strength:   She feels she needs to look down to walk.     Gait seems wider.   She feels she needs to hold on more to be stable.   She veers to the right.  The left leg seems slightly weak and buckles      Bladder/Bowel:   She notes more urinary frequency or urgency the past few months.   She had one episode of incontinence (as she was rushing to bathroom)  Fatigue/sleep:   She still has fatigue and naps every afternoon x 1 hour.    She feels very refreshed after a one hour nap and does better the rest of the day.    She falls asleep easily and stays  Asleep the whole night.  Mood/Cogntion:   She denies  any depression or anxiety.   She has noted mild 'brain fog' and feels she is forgetful at times.   She has no verbal fluencies issues.     Lyme:   Last year, she had more fatigue and was diagnosed with Lyme disease late 2015.   She was diagnosed in [place].   She was on doxycycline initially and then was switched to other medications.    Fatigue has done better more recently.  Today, I reviewed an MRI of the cervical spine that was performed at 2008. At C3 there is a central anterior, slightly to the right focus. At C5, there is a larger left lateral focus. At C6-C7 there is a subtle small right posterolateral focus. At T1, there is a subtle small left focus (could be artifact).    REVIEW OF SYSTEMS: Constitutional: No fevers, chills, sweats, or change in appetite.   She has fatigue Eyes: No visual changes, double vision, eye pain Ear, nose and throat: No hearing loss, ear pain, nasal congestion, sore throat Cardiovascular: No chest pain, palpitations Respiratory: No shortness of breath at rest or with exertion.   No wheezes GastrointestinaI: No nausea, vomiting, diarrhea, abdominal pain, fecal incontinence Genitourinary: No dysuria, urinary retention or frequency.  No nocturia. Musculoskeletal: No neck pain, back pain  Integumentary: No rash, pruritus, skin lesions Neurological: as above Psychiatric: No depression at this time.  No anxiety Endocrine: She has hypothyroidism.  No palpitations, diaphoresis, change in appetite, change in weigh or increased thirst Hematologic/Lymphatic: No anemia, purpura, petechiae. Allergic/Immunologic: No itchy/runny eyes, nasal congestion, recent allergic reactions, rashes  ALLERGIES: No Known Allergies  HOME MEDICATIONS:  Current outpatient prescriptions:  .  Ascorbic Acid 500 MG/15ML SYRP, Take by mouth., Disp: , Rfl:  .  Cholecalciferol (VITAMIN D3) 10000 UNITS capsule, Take 10,000 Units by mouth 4 (four) times a week., Disp: , Rfl:  .  Dimethyl  Fumarate (TECFIDERA) 240 MG CPDR, Take 1 capsule (240 mg total) by mouth 2 (two) times daily., Disp: 60 capsule, Rfl: 11 .  OVER THE COUNTER MEDICATION, 40 mg., Disp: , Rfl:  .  OVER THE COUNTER MEDICATION, 800 mg., Disp: , Rfl:  .  OVER THE COUNTER MEDICATION, 85 mg., Disp: , Rfl:  .  OVER THE COUNTER MEDICATION, 900 mg., Disp: , Rfl:  .  OVER THE COUNTER MEDICATION, 900 mg., Disp: , Rfl:  .  OVER THE COUNTER MEDICATION, 400 mg., Disp: , Rfl:  .  OVER THE COUNTER MEDICATION, 15 mg., Disp: , Rfl:  .  OVER THE COUNTER MEDICATION, 10 mg., Disp: , Rfl:  .  OVER THE COUNTER MEDICATION, 50 mg., Disp: , Rfl:  .  OVER THE COUNTER MEDICATION, 5 mg., Disp: , Rfl:  .  OVER THE COUNTER MEDICATION, 2 mg., Disp: , Rfl:  .  OVER THE COUNTER MEDICATION, 30 Units., Disp: , Rfl:  .  OVER THE COUNTER MEDICATION, 25 mg., Disp: , Rfl:  .  OVER THE COUNTER MEDICATION, 25 mg., Disp: , Rfl:  .  OVER THE COUNTER MEDICATION, 400 mg., Disp: , Rfl:  .  OVER THE COUNTER MEDICATION, 30 mg., Disp: , Rfl:  .  OVER THE COUNTER MEDICATION, 30 mg., Disp: , Rfl:  .  OVER THE COUNTER MEDICATION, 250 mg., Disp: , Rfl:  .  OVER THE COUNTER MEDICATION, 250 mg., Disp: , Rfl:  .  OVER THE COUNTER MEDICATION, 350 mg., Disp: , Rfl:  .  OVER THE COUNTER MEDICATION, 250 mg., Disp: , Rfl:  .  OVER THE COUNTER MEDICATION, 200 mg., Disp: , Rfl:  .  OVER THE COUNTER MEDICATION, 110 mcg., Disp: , Rfl:  .  OVER THE COUNTER MEDICATION, 70 mcg., Disp: , Rfl:  .  OVER THE COUNTER MEDICATION, 60 mcg., Disp: , Rfl:  .  OVER THE COUNTER MEDICATION, 750 Units., Disp: , Rfl:  .  OVER THE COUNTER MEDICATION, 90 Units., Disp: , Rfl:  .  OVER THE COUNTER MEDICATION, 25 Units., Disp: , Rfl:  .  SYNTHROID 75 MCG tablet, TK 1 T PO D OES BEFORE BREAKFAST, Disp: , Rfl: 2 .  Omega-3 Fatty Acids (FISH OIL PO), Take 2 tablets by mouth 2 (two) times daily., Disp: , Rfl:  No current facility-administered medications for this visit.  Facility-Administered  Medications Ordered in Other Visits:  .  gadopentetate dimeglumine (MAGNEVIST) injection 13 mL, 13 mL, Intravenous, Once PRN, Britt Bottom, MD .  gadopentetate dimeglumine (MAGNEVIST) injection 13 mL, 13 mL, Intravenous, Once PRN, Britt Bottom, MD  PAST MEDICAL HISTORY: Past Medical History  Diagnosis Date  . Neuromuscular disorder ([place])     MS  . Anemia   . Lyme disease   . Multiple sclerosis ([place])   . Vision abnormalities     PAST SURGICAL HISTORY: Past Surgical History  Procedure Laterality Date  . Knee arthroscopy  1996  . Breast surgery    . Dilatation & currettage/hysteroscopy with resectocope N/A 10/09/2012    Procedure: DILATATION & CURETTAGE/HYSTEROSCOPY WITH RESECTOCOPE;  Surgeon: [name], MD;  Location: [facility];  Service: Gynecology;  Laterality: N/A;  RESECTION with Versapoint     FAMILY HISTORY: Family History  Problem Relation Age of Onset  . Hypertension Mother   . Colon cancer Father     SOCIAL HISTORY:  Social History   Social History  . Marital Status: Married    Spouse Name: N/A  . Number of Children: N/A  . Years of Education: N/A   Occupational History  . Not on file.   Social History Main Topics  . Smoking status: Never Smoker   . Smokeless tobacco: Not on file  . Alcohol Use: Yes     Comment: 2x month  . Drug Use: No  . Sexual Activity: Not on file   Other Topics Concern  . Not on file   Social History Narrative     PHYSICAL EXAM  Filed Vitals:   05/14/15 0921  BP: 110/80  Pulse: 66  Resp: 14  Height: 5\' 6"  (1.676 m)  Weight: 136 lb 12.8 oz (62.052 kg)    Body mass index is 22.09 kg/(m^2).   General: The patient is well-developed and well-nourished and in no acute distress  Musculoskeletal:  Back is nontender  Neurologic Exam  Mental status: The patient is alert and oriented x 3 at the time of the examination. The patient has apparent normal recent and remote memory, with an apparently normal attention  span and concentration ability.   Speech is normal.  Cranial nerves: Extraocular movements are full. Pupils are equal, round, and reactive to light and accomodation.  Visual fields are full.  Facial symmetry is present. There is good facial sensation to soft touch bilaterally.Facial strength is normal.  Trapezius and sternocleidomastoid strength is normal. No dysarthria is noted.  The tongue is midline, and the patient has symmetric elevation of the soft palate. No obvious hearing deficits are noted.  Motor:  Muscle bulk is normal.   Tone is normal. Strength is  5 / 5 in all 4 extremities.   Sensory: Sensory testing is intact to pinprick, soft touch and vibration sensation in arms, reduced vibration in toes.    Slight reduced touch in bottoms of feet.   .  Coordination: Cerebellar testing reveals good finger-nose-finger and slightly reduced heel-to-shin bilaterally.  Gait and station: Station is normal.   Gait is slightly wide. Tandem gait is wide. Romberg is negative.   Reflexes: Deep tendon reflexes are symmetric and brisk bilaterally, with spread at knees.  No clonus.   Plantar responses are flexor.    DIAGNOSTIC DATA (LABS, IMAGING, TESTING) - I reviewed patient records, labs, notes, testing and imaging myself where available.  Lab Results  Component Value Date   WBC 8.7 11/03/2014   HGB 12.8 10/07/2012   HCT 37.8 11/03/2014   MCV 92 11/03/2014   PLT 258 11/03/2014      Component Value Date/Time   NA 144 11/03/2014 0000   NA 141 09/01/2008 2213   K 4.7 11/03/2014 0000   CL 105 11/03/2014 0000   CO2 26 11/03/2014 0000   GLUCOSE 96 11/03/2014 0000   GLUCOSE 94 09/01/2008 2213   BUN 14 11/03/2014 0000   BUN 10 09/01/2008 2213   CREATININE 0.61 11/03/2014 0000   CALCIUM 9.2 11/03/2014 0000  PROT 6.3 11/03/2014 0000   ALBUMIN 4.3 11/03/2014 0000   AST 45* 11/03/2014 0000   ALT 51* 11/03/2014 0000   ALKPHOS 47 11/03/2014 0000   BILITOT <0.2 11/03/2014 0000   GFRNONAA 104  11/03/2014 0000   GFRAA 120 11/03/2014 0000        ASSESSMENT AND PLAN  MULTIPLE SCLEROSIS  Dysesthesia  Gait disturbance  Other fatigue   1.   She will continue Tecfidera for now.   We will check an MRI of the cervical and thoracic spine with and without contrast and compare with her prior studies to make sure she is not having subclinical progression and to r/o myelopathy. If subclinicla changes are occurring, we will need to consider another medication. 2.   Check blood work. If there is any lymphopenia or hepatotoxicity, consider dose reduction or change in therapy.   Also check Lyme and B12 3.   She is advised to continue to be active and exercises as tolerated. 4.   Return in 6 months or sooner if there are new or worsening neurologic symptoms or based on the results of the MRI. 5.   She was once diagnosed with Lyme disease. We will recheck this and sent her to infectious disease for further evaluation if the Western blot is abnormal. 6.   She will return to see me in 4 or 5 months but call sooner if there are new or worsening neurologic symptoms.  40 minute face-to-face evaluation with greater than one half of the time counseling an coordinating care about her MS, possible Lyme and related symptoms.  Richard A. Felecia Shelling, MD, PhD [id number], [id number] AM Certified in Neurology, Clinical Neurophysiology, Sleep Medicine, Pain Medicine and Neuroimaging  [facility] [address] [place], [place] [address] [phone]

## 2015-05-19 LAB — CBC WITH DIFFERENTIAL/PLATELET
BASOS ABS: 0 10*3/uL (ref 0.0–0.2)
Basos: 1 %
EOS (ABSOLUTE): 0 10*3/uL (ref 0.0–0.4)
Eos: 1 %
Hematocrit: 42.4 % (ref 34.0–46.6)
Hemoglobin: 14.5 g/dL (ref 11.1–15.9)
IMMATURE GRANULOCYTES: 0 %
Immature Grans (Abs): 0 10*3/uL (ref 0.0–0.1)
Lymphocytes Absolute: 1.3 10*3/uL (ref 0.7–3.1)
Lymphs: 26 %
MCH: 31 pg (ref 26.6–33.0)
MCHC: 34.2 g/dL (ref 31.5–35.7)
MCV: 91 fL (ref 79–97)
Monocytes Absolute: 0.4 10*3/uL (ref 0.1–0.9)
Monocytes: 9 %
NEUTROS PCT: 63 %
Neutrophils Absolute: 3.2 10*3/uL (ref 1.4–7.0)
PLATELETS: 235 10*3/uL (ref 150–379)
RBC: 4.68 x10E6/uL (ref 3.77–5.28)
RDW: 14.4 % (ref 12.3–15.4)
WBC: 5 10*3/uL (ref 3.4–10.8)

## 2015-05-19 LAB — LYME AB/WESTERN BLOT REFLEX: LYME IGG/IGM AB: 1.3 {ISR} — AB (ref 0.00–0.90)

## 2015-05-19 LAB — COMPREHENSIVE METABOLIC PANEL
ALT: 26 IU/L (ref 0–32)
AST: 23 IU/L (ref 0–40)
Albumin/Globulin Ratio: 2.6 — ABNORMAL HIGH (ref 1.2–2.2)
Albumin: 5.2 g/dL (ref 3.5–5.5)
Alkaline Phosphatase: 47 IU/L (ref 39–117)
BUN/Creatinine Ratio: 27 — ABNORMAL HIGH (ref 9–23)
BUN: 14 mg/dL (ref 6–24)
Bilirubin Total: 0.3 mg/dL (ref 0.0–1.2)
CALCIUM: 9.8 mg/dL (ref 8.7–10.2)
CO2: 24 mmol/L (ref 18–29)
CREATININE: 0.51 mg/dL — AB (ref 0.57–1.00)
Chloride: 100 mmol/L (ref 96–106)
GFR calc Af Amer: 126 mL/min/{1.73_m2} (ref >59)
GFR, EST NON AFRICAN AMERICAN: 109 mL/min/{1.73_m2} (ref >59)
GLOBULIN, TOTAL: 2 g/dL (ref 1.5–4.5)
Glucose: 103 mg/dL — ABNORMAL HIGH (ref 65–99)
Potassium: 4.3 mmol/L (ref 3.5–5.2)
SODIUM: 141 mmol/L (ref 134–144)
Total Protein: 7.2 g/dL (ref 6.0–8.5)

## 2015-05-19 LAB — LYME, WESTERN BLOT, SERUM (REFLEXED)
IGG P28 AB.: ABSENT
IGG P45 AB.: ABSENT
IGG P93 AB.: ABSENT
IgG P18 Ab.: ABSENT
IgG P23 Ab.: ABSENT
IgG P30 Ab.: ABSENT
IgG P58 Ab.: ABSENT
IgG P66 Ab.: ABSENT
IgM P39 Ab.: ABSENT
IgM P41 Ab.: ABSENT
LYME IGG WB: NEGATIVE
LYME IGM WB: NEGATIVE

## 2015-05-19 LAB — TSH: TSH: 1.15 u[IU]/mL (ref 0.450–4.500)

## 2015-05-19 LAB — VITAMIN B12: Vitamin B-12: >2000 pg/mL — ABNORMAL HIGH (ref 211–946)

## 2015-05-20 ENCOUNTER — Telehealth: Payer: Self-pay | Admitting: *Deleted

## 2015-05-20 NOTE — Telephone Encounter (Signed)
-----   Message from Britt Bottom, MD sent at 05/19/2015  7:47 PM EDT ----- Please let her know that the lab work is consistent with her having Lyme disease in the past but not having active Lyme disease. Other labs were normal

## 2015-05-20 NOTE — Telephone Encounter (Signed)
Patient is returning your call.  

## 2015-05-20 NOTE — Telephone Encounter (Signed)
LMTC./fim 

## 2015-05-20 NOTE — Telephone Encounter (Signed)
I have spoken with Samantha Church this afternoon, and per RAS, advised that labs are consistent with her having Lyme dz. in the past, but not having active lyme dz.  Other labs were normal.  She verbalized understanding of same/fim

## 2015-06-02 ENCOUNTER — Ambulatory Visit (INDEPENDENT_AMBULATORY_CARE_PROVIDER_SITE_OTHER): Payer: 59

## 2015-06-02 DIAGNOSIS — G35 Multiple sclerosis: Secondary | ICD-10-CM | POA: Diagnosis not present

## 2015-06-02 DIAGNOSIS — R269 Unspecified abnormalities of gait and mobility: Secondary | ICD-10-CM

## 2015-06-02 DIAGNOSIS — R208 Other disturbances of skin sensation: Secondary | ICD-10-CM

## 2015-06-03 MED ORDER — GADOPENTETATE DIMEGLUMINE 469.01 MG/ML IV SOLN: 13.0000 mL | Freq: Once | INTRAVENOUS | Status: DC | PRN

## 2015-06-08 ENCOUNTER — Telehealth: Payer: Self-pay | Admitting: Neurology

## 2015-06-08 NOTE — Telephone Encounter (Signed)
Patient was called on 06/04/2015 and again today 06/08/2015 to discuss the results of the cervical and thoracic MRIs. Both of them showed 2 lesions that were not present in 2007.  I will try to call her again later in the day.  I reached [place] and discuss the above findings. The time being, she wishes to stay on Tecfidera. We did discuss Zinbryta, ocrelizumab and Lemtrada as possible other medications to consider. She will definitely consider one of these options if she has another exacerbation. We will recheck MRIs in about a year.

## 2015-09-08 ENCOUNTER — Telehealth: Payer: Self-pay | Admitting: Neurology

## 2015-09-08 MED ORDER — DIMETHYL FUMARATE 240 MG PO CPDR: 240.0000 mg | DELAYED_RELEASE_CAPSULE | Freq: Two times a day (BID) | ORAL | 3 refills | Status: DC

## 2015-09-08 NOTE — Telephone Encounter (Signed)
Pt called in and needs PA on Dimethyl Fumarate (TECFIDERA) 240 MG CPDR.  [place], [place]

## 2015-09-08 NOTE — Telephone Encounter (Addendum)
Patient called to advise she has new insurance and prescription for [facility] should be sent to [facility] Fax [phone]. [employer] ID [id number] Grp [id number], BIN [id number] effective 08/10/2015.

## 2015-09-08 NOTE — Telephone Encounter (Signed)
Tecfidera escribed to [facility]/fim

## 2015-09-14 NOTE — Telephone Encounter (Signed)
I have spoken with Takeesha and advised PA for [facility] has been completed and approved/fim

## 2015-09-14 NOTE — Telephone Encounter (Signed)
Patient called to advise, Terrell sent paperwork over for PA for [facility]. Patient requests that our office call to give verbal PA so this medication can be pushed through, please call [phone].

## 2015-09-14 NOTE — Telephone Encounter (Signed)
PA for Tecfidera completed by phone with [facility].  Approved for 6 mos. from today.  PA# [id number]

## 2015-09-21 ENCOUNTER — Encounter: Payer: Self-pay | Admitting: *Deleted

## 2015-10-14 ENCOUNTER — Encounter: Payer: Self-pay | Admitting: Neurology

## 2015-10-14 ENCOUNTER — Ambulatory Visit (INDEPENDENT_AMBULATORY_CARE_PROVIDER_SITE_OTHER): Payer: Managed Care, Other (non HMO) | Admitting: Neurology

## 2015-10-14 VITALS — BP 114/78 | HR 68 | Resp 4 | Ht 66.0 in | Wt 140.5 lb

## 2015-10-14 DIAGNOSIS — G35 Multiple sclerosis: Secondary | ICD-10-CM | POA: Diagnosis not present

## 2015-10-14 DIAGNOSIS — R208 Other disturbances of skin sensation: Secondary | ICD-10-CM | POA: Diagnosis not present

## 2015-10-14 DIAGNOSIS — R350 Frequency of micturition: Secondary | ICD-10-CM | POA: Insufficient documentation

## 2015-10-14 DIAGNOSIS — R269 Unspecified abnormalities of gait and mobility: Secondary | ICD-10-CM

## 2015-10-14 DIAGNOSIS — R5383 Other fatigue: Secondary | ICD-10-CM

## 2015-10-14 DIAGNOSIS — R29898 Other symptoms and signs involving the musculoskeletal system: Secondary | ICD-10-CM | POA: Insufficient documentation

## 2015-10-14 MED ORDER — MODAFINIL 200 MG PO TABS: 200.0000 mg | ORAL_TABLET | Freq: Every day | ORAL | 5 refills | Status: DC

## 2015-10-14 NOTE — Progress Notes (Signed)
[facility]  PATIENT: Samantha Church DOB: 01-27-1960  REFERRING DOCTOR OR PCP:  Donald Prose SOURCE: Patient and records from [facility] neurology.  _________________________________   HISTORICAL  CHIEF COMPLAINT:  Chief Complaint  Patient presents with  . Multiple Sclerosis    Sts. she continues to tolerate Tecfidera well.  Thinks gait/balance disturbance is more constant.  Sts. both legs feel heavy, and left knee seems to buckle./fim    HISTORY OF PRESENT ILLNESS:  Samantha Church is a 55 year old woman with multiple sclerosis reporting more numbness and gait issues.   Changes have been gradual.   Numbness:   She is noting numbness below her neck left = right.   If she scratches her back, she feels sensation is reduced but not painful.  She feels she needs to touch a wall or hold on when she closes her eyes (i.e. In shower).   She denies any falls but her left knee buckles.    She has mild numbness in hands but feels she can hold glasses, etc without issues.     Gait/strength:    She feels less secure walking and the legs feels heavy, worse on the left.   Legs feels a little tight.  She feels she needs to look down to walk.     Gait seems wider.    Arms are better with just minimal reduced grip at times  MS:  She was diagnosed in 2007 after presenting with right sided numbness and mild clumsiness / allodynia.    MRI was consistent with MS and she was started with Betaseron.  She then switched to Copaxone but had exacerbations and switched to Tysabri.     She converted to Tysabri but became JCV Ab positive and switched to Gilenya.   Due to exacerbations on Gilenya, she switched back to Tysabri x one year and then switched to Tecfidera (in 2014).    She has tolerated Tecfidera well.   MRI of the brain performed October 2015 showed 1 focus not present on the December 2014 MRI.  MRI reports and images were reviewed and I concur with following interpretations MRI Thoracic  06/04/15: IMPRESSION:  This MRI of the thoracic spine with and without contrast shows the following: 1.    Two T2 hyperintense foci within the spinal cord, adjacent to T8-T9 and adjacent to T12-L1 consistent with chronic demyelinating plaque associated with multiple sclerosis. They do not appear to be acute but they were not present on the MRI from 01/24/2005. 2.   There are no significant degenerative changes noted within the thoracic spine.  3.   There are no acute findings. There is a normal enhancement pattern.   MRI Cervical 06/04/15: IMPRESSION:  This MRI of the cervical spine with and without contrast shows the following: 1.   There are 4 foci within the spinal cord, adjacent to C2, C3, C4 and C5. The focus to the right adjacent to C3 and the focus to the left adjacent to C4 were not present on the 03/08/2006 MRI. None of the foci appeared to be acute. 2.   There are mild degenerative changes at C4-C5 and C5-C6 that did not lead to any nerve root impingement.   These changes have progressed when compared to the 2008 MRI 3.    There are no acute findings and there is a normal enhancement pattern.   Bladder/Bowel:   She notes more urinary frequency and urgency.  No incontinence.   She had one episode of  incontinence (as she was rushing to bathroom)  Fatigue/sleep:   She still has physical and cognitive fatigue.   She is sleepy around 2 pm and naps every afternoon x 1 hour.    She feels very refreshed after a one hour nap and does better the rest of the day.    She falls asleep easily at night and sleeps 8 hours.   (9-5 am) .  Mood/Cogntion:   She denies any depression or anxiety.   She has noted mild 'brain fog' and feels she is forgetful at times.   She has no verbal fluencies issues.      REVIEW OF SYSTEMS: Constitutional: No fevers, chills, sweats, or change in appetite.   She has fatigue Eyes: No visual changes, double vision, eye pain Ear, nose and throat: No hearing loss, ear  pain, nasal congestion, sore throat Cardiovascular: No chest pain, palpitations Respiratory: No shortness of breath at rest or with exertion.   No wheezes GastrointestinaI: No nausea, vomiting, diarrhea, abdominal pain, fecal incontinence Genitourinary: No dysuria, urinary retention or frequency.  No nocturia. Musculoskeletal: No neck pain, back pain Integumentary: No rash, pruritus, skin lesions Neurological: as above Psychiatric: No depression at this time.  No anxiety Endocrine: She has hypothyroidism.  No palpitations, diaphoresis, change in appetite, change in weigh or increased thirst Hematologic/Lymphatic: No anemia, purpura, petechiae. Allergic/Immunologic: No itchy/runny eyes, nasal congestion, recent allergic reactions, rashes  ALLERGIES: No Known Allergies  HOME MEDICATIONS:  Current Outpatient Prescriptions:  .  Ascorbic Acid 500 MG/15ML SYRP, Take by mouth., Disp: , Rfl:  .  Cholecalciferol (VITAMIN D3) 10000 UNITS capsule, Take 10,000 Units by mouth 4 (four) times a week., Disp: , Rfl:  .  Dimethyl Fumarate (TECFIDERA) 240 MG CPDR, Take 1 capsule (240 mg total) by mouth 2 (two) times daily., Disp: 180 capsule, Rfl: 3 .  Omega-3 Fatty Acids (FISH OIL PO), Take 2 tablets by mouth 2 (two) times daily., Disp: , Rfl:  .  OVER THE COUNTER MEDICATION, 40 mg., Disp: , Rfl:  .  OVER THE COUNTER MEDICATION, 800 mg., Disp: , Rfl:  .  OVER THE COUNTER MEDICATION, 85 mg., Disp: , Rfl:  .  OVER THE COUNTER MEDICATION, 900 mg., Disp: , Rfl:  .  OVER THE COUNTER MEDICATION, 900 mg., Disp: , Rfl:  .  OVER THE COUNTER MEDICATION, 400 mg., Disp: , Rfl:  .  OVER THE COUNTER MEDICATION, 15 mg., Disp: , Rfl:  .  OVER THE COUNTER MEDICATION, 10 mg., Disp: , Rfl:  .  OVER THE COUNTER MEDICATION, 50 mg., Disp: , Rfl:  .  OVER THE COUNTER MEDICATION, 5 mg., Disp: , Rfl:  .  OVER THE COUNTER MEDICATION, 2 mg., Disp: , Rfl:  .  OVER THE COUNTER MEDICATION, 30 Units., Disp: , Rfl:  .  OVER  THE COUNTER MEDICATION, 25 mg., Disp: , Rfl:  .  OVER THE COUNTER MEDICATION, 25 mg., Disp: , Rfl:  .  OVER THE COUNTER MEDICATION, 400 mg., Disp: , Rfl:  .  OVER THE COUNTER MEDICATION, 30 mg., Disp: , Rfl:  .  OVER THE COUNTER MEDICATION, 30 mg., Disp: , Rfl:  .  OVER THE COUNTER MEDICATION, 250 mg., Disp: , Rfl:  .  OVER THE COUNTER MEDICATION, 250 mg., Disp: , Rfl:  .  OVER THE COUNTER MEDICATION, 350 mg., Disp: , Rfl:  .  OVER THE COUNTER MEDICATION, 250 mg., Disp: , Rfl:  .  OVER THE COUNTER MEDICATION, 200 mg., Disp: ,  Rfl:  .  OVER THE COUNTER MEDICATION, 110 mcg., Disp: , Rfl:  .  OVER THE COUNTER MEDICATION, 70 mcg., Disp: , Rfl:  .  OVER THE COUNTER MEDICATION, 60 mcg., Disp: , Rfl:  .  OVER THE COUNTER MEDICATION, 750 Units., Disp: , Rfl:  .  OVER THE COUNTER MEDICATION, 90 Units., Disp: , Rfl:  .  OVER THE COUNTER MEDICATION, 25 Units., Disp: , Rfl:  .  SYNTHROID 75 MCG tablet, TK 1 T PO D OES BEFORE BREAKFAST, Disp: , Rfl: 2 .  modafinil (PROVIGIL) 200 MG tablet, Take 1 tablet (200 mg total) by mouth daily., Disp: 30 tablet, Rfl: 5 No current facility-administered medications for this visit.   Facility-Administered Medications Ordered in Other Visits:  .  gadopentetate dimeglumine (MAGNEVIST) injection 13 mL, 13 mL, Intravenous, Once PRN, Britt Bottom, MD .  gadopentetate dimeglumine (MAGNEVIST) injection 13 mL, 13 mL, Intravenous, Once PRN, Britt Bottom, MD .  gadopentetate dimeglumine (MAGNEVIST) injection 13 mL, 13 mL, Intravenous, Once PRN, Britt Bottom, MD  PAST MEDICAL HISTORY: Past Medical History:  Diagnosis Date  . Anemia   . Lyme disease   . Multiple sclerosis ([place])   . Neuromuscular disorder ([place])    MS  . Vision abnormalities     PAST SURGICAL HISTORY: Past Surgical History:  Procedure Laterality Date  . BREAST SURGERY    . DILATATION & CURRETTAGE/HYSTEROSCOPY WITH RESECTOCOPE N/A 10/09/2012   Procedure: DILATATION & CURETTAGE/HYSTEROSCOPY  WITH RESECTOCOPE;  Surgeon: [name], MD;  Location: [facility];  Service: Gynecology;  Laterality: N/A;  RESECTION with Versapoint   . KNEE ARTHROSCOPY  1996    FAMILY HISTORY: Family History  Problem Relation Age of Onset  . Hypertension Mother   . Colon cancer Father     SOCIAL HISTORY:  Social History   Social History  . Marital status: Married    Spouse name: N/A  . Number of children: N/A  . Years of education: N/A   Occupational History  . Not on file.   Social History Main Topics  . Smoking status: Never Smoker  . Smokeless tobacco: Not on file  . Alcohol use Yes     Comment: 2x month  . Drug use: No  . Sexual activity: Not on file   Other Topics Concern  . Not on file   Social History Narrative  . No narrative on file     PHYSICAL EXAM  Vitals:   10/14/15 0827  BP: 114/78  Pulse: 68  Resp: (!) 4  Weight: 140 lb 8 oz (63.7 kg)  Height: 5\' 6"  (1.676 m)    Body mass index is 22.68 kg/m.   General: The patient is well-developed and well-nourished and in no acute distress  Musculoskeletal:  Back is nontender  Neurologic Exam  Mental status: The patient is alert and oriented x 3 at the time of the examination. The patient has apparent normal recent and remote memory, with an apparently normal attention span and concentration ability.   Speech is normal.  Cranial nerves: Extraocular movements are full. Pupils are equal, round, and reactive to light and accomodation.  Visual fields are full.  Facial symmetry is present. There is good facial sensation to soft touch bilaterally.Facial strength is normal.  Trapezius and sternocleidomastoid strength is normal. No dysarthria is noted.  The tongue is midline, and the patient has symmetric elevation of the soft palate. No obvious hearing deficits are noted.  Motor:  Muscle bulk is normal.  Tone is normal. Strength is  5 / 5 in all 4 extremities.   Sensory: Sensory testing is intact to pinprick, soft  touch and vibration sensation in arms, reduced vibration in toes.    Slight reduced touch in bottoms of feet.   .  Coordination: Cerebellar testing reveals good finger-nose-finger and slightly reduced heel-to-shin bilaterally.  Gait and station: Station is normal.   Gait is slightly wide. Tandem gait is wide. Romberg is negative.   Reflexes: Deep tendon reflexes are symmetric and brisk bilaterally, with spread at knees.  No clonus.   Plantar responses are flexor.    DIAGNOSTIC DATA (LABS, IMAGING, TESTING) - I reviewed patient records, labs, notes, testing and imaging myself where available.  Lab Results  Component Value Date   WBC 5.0 05/14/2015   HGB 12.8 10/07/2012   HCT 42.4 05/14/2015   MCV 91 05/14/2015   PLT 235 05/14/2015      Component Value Date/Time   NA 141 05/14/2015 1013   K 4.3 05/14/2015 1013   CL 100 05/14/2015 1013   CO2 24 05/14/2015 1013   GLUCOSE 103 (H) 05/14/2015 1013   GLUCOSE 94 09/01/2008 2213   BUN 14 05/14/2015 1013   CREATININE 0.51 (L) 05/14/2015 1013   CALCIUM 9.8 05/14/2015 1013   PROT 7.2 05/14/2015 1013   ALBUMIN 5.2 05/14/2015 1013   AST 23 05/14/2015 1013   ALT 26 05/14/2015 1013   ALKPHOS 47 05/14/2015 1013   BILITOT 0.3 05/14/2015 1013   GFRNONAA 109 05/14/2015 1013   GFRAA 126 05/14/2015 1013        ASSESSMENT AND PLAN  MULTIPLE SCLEROSIS - Plan: CBC with Differential/Platelet, MR Brain W Wo Contrast, Ambulatory referral to Physical Therapy, Comprehensive metabolic panel  Gait disturbance - Plan: MR Brain W Wo Contrast, Ambulatory referral to Physical Therapy  Other fatigue  Dysesthesia - Plan: MR Brain W Wo Contrast  Urinary frequency  Left leg weakness   1.   Continue Tecfidera for now.   We will check an MRI of the brain to assess for subclinical progression.  If subclinical changes are occurring, we will need to consider another medication (Probably ocrelizumab or Lemtrada). 2.   Check CBC and CMP to assess for  excessive lymphopenia or hepatotoxicity or other changes. 3.   She is advised to continue to be active and exercises as tolerated.   Gait has slightly worsened over the past year or 2 and I will set her up with physical therapy to see if she can get a benefit. If gait worsens further,  I would consider Ampyra. 4.   Return in 6 months or sooner if there are new or worsening neurologic symptoms or based on the results of the MRI.  40 minute face-to-face evaluation with greater than one half of the time counseling and coordinating care about her MS, and related symptoms.  Kaden Daughdrill A. Felecia Shelling, MD, PhD [id number], [id number] PM Certified in Neurology, Clinical Neurophysiology, Sleep Medicine, Pain Medicine and Neuroimaging  [facility] [address] [place], [place] [address] [phone]

## 2015-10-15 ENCOUNTER — Telehealth: Payer: Self-pay | Admitting: *Deleted

## 2015-10-15 LAB — CBC WITH DIFFERENTIAL/PLATELET
BASOS: 1 %
Basophils Absolute: 0 10*3/uL (ref 0.0–0.2)
EOS (ABSOLUTE): 0 10*3/uL (ref 0.0–0.4)
EOS: 1 %
Hematocrit: 41 % (ref 34.0–46.6)
Hemoglobin: 13.4 g/dL (ref 11.1–15.9)
IMMATURE GRANULOCYTES: 0 %
Immature Grans (Abs): 0 10*3/uL (ref 0.0–0.1)
Lymphocytes Absolute: 1.2 10*3/uL (ref 0.7–3.1)
Lymphs: 25 %
MCH: 30.2 pg (ref 26.6–33.0)
MCHC: 32.7 g/dL (ref 31.5–35.7)
MCV: 93 fL (ref 79–97)
MONOS ABS: 0.5 10*3/uL (ref 0.1–0.9)
Monocytes: 11 %
NEUTROS ABS: 2.9 10*3/uL (ref 1.4–7.0)
NEUTROS PCT: 62 %
Platelets: 212 10*3/uL (ref 150–379)
RBC: 4.43 x10E6/uL (ref 3.77–5.28)
RDW: 13.7 % (ref 12.3–15.4)
WBC: 4.7 10*3/uL (ref 3.4–10.8)

## 2015-10-15 LAB — COMPREHENSIVE METABOLIC PANEL
ALK PHOS: 37 IU/L — AB (ref 39–117)
ALT: 23 IU/L (ref 0–32)
AST: 22 IU/L (ref 0–40)
Albumin/Globulin Ratio: 2.1 (ref 1.2–2.2)
Albumin: 4.8 g/dL (ref 3.5–5.5)
BUN/Creatinine Ratio: 19 (ref 9–23)
BUN: 11 mg/dL (ref 6–24)
Bilirubin Total: 0.3 mg/dL (ref 0.0–1.2)
CO2: 26 mmol/L (ref 18–29)
CREATININE: 0.58 mg/dL (ref 0.57–1.00)
Calcium: 9.5 mg/dL (ref 8.7–10.2)
Chloride: 105 mmol/L (ref 96–106)
GFR calc Af Amer: 121 mL/min/{1.73_m2} (ref >59)
GFR calc non Af Amer: 105 mL/min/{1.73_m2} (ref >59)
GLUCOSE: 94 mg/dL (ref 65–99)
Globulin, Total: 2.3 g/dL (ref 1.5–4.5)
Potassium: 4.6 mmol/L (ref 3.5–5.2)
SODIUM: 145 mmol/L — AB (ref 134–144)
Total Protein: 7.1 g/dL (ref 6.0–8.5)

## 2015-10-15 NOTE — Telephone Encounter (Signed)
-----   Message from Britt Bottom, MD sent at 10/15/2015  8:27 AM EDT ----- Please let her know that the lab work was fine.

## 2015-10-15 NOTE — Telephone Encounter (Signed)
LMOM that per RAS, labs done in our office are ok.  She does not need to return this call unless she has questions/fim

## 2015-10-22 ENCOUNTER — Ambulatory Visit: Payer: Managed Care, Other (non HMO) | Attending: Neurology

## 2015-10-22 DIAGNOSIS — R42 Dizziness and giddiness: Secondary | ICD-10-CM

## 2015-10-22 DIAGNOSIS — R2689 Other abnormalities of gait and mobility: Secondary | ICD-10-CM

## 2015-10-22 DIAGNOSIS — M6281 Muscle weakness (generalized): Secondary | ICD-10-CM | POA: Insufficient documentation

## 2015-10-22 NOTE — Therapy (Signed)
[facility] [address] [place], [place], [address] Phone: [phone]   Fax:  [phone]  Physical Therapy Evaluation  Patient Details  Name: Samantha Church MRN: [id number] Date of Birth: 1960/11/02 Referring Provider: Dr. Felecia Shelling  Encounter Date: 10/22/2015      PT End of Session - 10/22/15 1401    Visit Number 1   Number of Visits 9   Date for PT Re-Evaluation 11/21/15   Authorization Type [employer]-waiting on visit limit number   PT Start Time 1316   PT Stop Time 1356   PT Time Calculation (min) 40 min   Equipment Utilized During Treatment --  min guard to S prn   Activity Tolerance Patient tolerated treatment well   Behavior During Therapy [facility] for tasks assessed/performed      Past Medical History:  Diagnosis Date  . Anemia   . Lyme disease   . Multiple sclerosis ([place])   . Neuromuscular disorder ([place])    MS  . Vision abnormalities     Past Surgical History:  Procedure Laterality Date  . BREAST SURGERY    . DILATATION & CURRETTAGE/HYSTEROSCOPY WITH RESECTOCOPE N/A 10/09/2012   Procedure: DILATATION & CURETTAGE/HYSTEROSCOPY WITH RESECTOCOPE;  Surgeon: [name], MD;  Location: [facility];  Service: Gynecology;  Laterality: N/A;  RESECTION with Versapoint   . KNEE ARTHROSCOPY  1996    There were no vitals filed for this visit.       Subjective Assessment - 10/22/15 1323    Subjective Pt has hx of MS. Pt states within the past year (especially last 3-4 months) her L knee has been buckling after approx. 5 minutes of amb. Pt has N/T in B feet. Pt also has difficulty picking up foot during amb. Pt reports she has relapsing-remitting MS, but Dr. Felecia Shelling pt is "going in the direction of progressive MS".  Pt notices fatigue when getting up at work and walking to the [employer].    Pertinent History MS, Lyme Disease, hypothyroidism, anemia   Patient Stated Goals Be able to be stable and continue to walk.     Currently in Pain? No/denies            [facility] PT Assessment - 10/22/15 1328      Assessment   Medical Diagnosis MS, gait disturbance    Referring Provider Dr. Felecia Shelling   Onset Date/Surgical Date 10/22/14   Hand Dominance Right   Prior Therapy none     Precautions   Precautions Fall   Precaution Comments based on FGA score     Restrictions   Weight Bearing Restrictions No     Balance Screen   Has the patient fallen in the past 6 months No   Has the patient had a decrease in activity level because of a fear of falling?  No   Is the patient reluctant to leave their home because of a fear of falling?  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     Coordination   Gross Motor Movements are Fluid and Coordinated Yes   Fine Motor Movements are Fluid and Coordinated Yes  and RAMs WNL   Finger Nose Finger Test WNL   Heel Shin Test WNL     Posture/Postural Control   Posture/Postural Control Postural limitations   Postural Limitations Forward head     Tone   Assessment Location Left Lower Extremity;Right Lower Extremity     ROM / Strength   AROM / PROM / Strength AROM;Strength     AROM    Overall AROM  Within functional limits for tasks performed   Overall AROM Comments BUE/LE WFL     Strength   Overall Strength Deficits   Overall Strength Comments RLE: hip flex: 4/5, knee ext: 4/5, knee flex: 4/5, ankle DF: 4/5. LLE hip flex: 3+/5, knee ext: 4-/5, knee flex: 4-/5, ankle DF: 3+/5. Gross hip abd/add: 3+/5.      Transfers   Transfers Sit to Stand;Stand to Sit   Sit to Stand 7: Independent;With upper extremity assist;From chair/3-in-1   Stand to Sit 7: Independent;To chair/3-in-1     Ambulation/Gait   Ambulation/Gait Yes   Ambulation/Gait Assistance 5: Supervision   Ambulation/Gait Assistance Details No LOB during amb. over even terrain in straight trajectory but incr. sway and reports of slight dizziness during FGA.   Ambulation Distance (Feet) 300 Feet   Assistive device None   Gait Pattern Step-through pattern;Decreased dorsiflexion - left;Decreased stance time - left;Decreased step length - left   Ambulation Surface Level;Indoor   Gait velocity 3.35ft/sec.     Functional Gait  Assessment   Gait assessed  Yes   Gait Level Surface Walks 20 ft in less than 7 sec but greater than 5.5 sec, uses assistive device, slower speed, mild gait deviations, or deviates 6-10 in outside of the 12 in walkway width.  5.8sec.   Change in Gait Speed Able to change speed, demonstrates mild gait deviations, deviates 6-10 in outside of the 12 in walkway width, or no gait deviations, unable to achieve a major change in velocity, or uses a change in velocity, or uses an assistive device.   Gait with Horizontal Head Turns Performs head turns smoothly with slight change in gait velocity (eg, minor disruption to smooth gait path), deviates 6-10 in outside 12 in walkway width, or uses an assistive device.   Gait with Vertical Head Turns Performs task with slight change in gait velocity (eg, minor disruption to smooth gait path), deviates 6 - 10 in outside 12 in walkway width or uses assistive device    Gait and Pivot Turn Pivot turns safely within 3 sec and stops quickly with no loss of balance.   Step Over Obstacle Is able to step over 2 stacked shoe boxes taped together (9 in total height) without changing gait speed. No evidence of imbalance.   Gait with Narrow Base of Support Ambulates less than 4 steps heel to toe or cannot perform without assistance.   Gait with Eyes Closed Walks 20 ft, slow speed, abnormal gait pattern, evidence for imbalance, deviates 10-15 in outside  12 in walkway width. Requires more than 9 sec to ambulate 20 ft.   Ambulating Backwards Walks 20 ft, slow speed, abnormal gait pattern, evidence for imbalance, deviates 10-15 in outside 12 in walkway width.   Steps Alternating feet, no rail.   Total Score 19     RLE Tone   RLE Tone Within Functional Limits     LLE Tone   LLE Tone Within Functional Limits                           PT Education - 10/22/15 1400    Education provided Yes   Education Details PT discussed frequency/duration and outcome measure results.   Person(s) Educated Patient   Methods Explanation   Comprehension Verbalized understanding          PT Short Term Goals - 10/22/15 1525      PT SHORT TERM GOAL #1   Title same as LTGs           PT Long Term Goals - 10/22/15 1525      PT LONG TERM GOAL #1   Title Pt will be IND with HEP in order to improve strength, balance and dizziness.    Status New     PT LONG TERM GOAL #2   Title Pt will improve FGA score from 19/30 to >/=27/30 to decr. falls risk.    Status New     PT LONG TERM GOAL #3   Title Pt will amb. 1000' over even/uneven terrain, with LRAD at MOD I level, while performing head turns/nods in order to improve functional mobility and go for walks with spouse.    Status New     PT LONG TERM GOAL #4   Title Pt will amb. 500' over even terrain, IND, no AD without c/o L knee buckling in order to amb. at home and work safely.    Status New                Plan - 10/22/15 1406    Clinical Impression Statement Pt is a pleasant 55y/o female presenting to [facility] neuro for MS and gait disturbance. Pt's PMH significant for the following: MS, Lyme Disease, hypothyroidism, anemia. Pt presented with the following impairments during the exam: gait deviations, dizziness, impaired balance, decr. strength (L>R LE), decr. endurance, and impaired sensation. Pt's FGA indicates she is at medium risk for falls. Pt would benefit from skilled PT to improve safety during functional mobility.    Rehab Potential Good   Clinical Impairments Affecting Rehab Potential MS, Lyme Disease, hypothyroidism, anemia   PT Frequency 2x / week   PT Duration 4 weeks   PT Treatment/Interventions ADLs/Self Care Home Management;Biofeedback;Canalith Repostioning;Electrical Stimulation;Neuromuscular re-education;Balance training;Therapeutic exercise;Therapeutic activities;Functional mobility training;Stair training;Gait training;DME Instruction;Orthotic Fit/Training;Patient/family education;Vestibular;Energy conservation;Manual techniques   PT Next Visit Plan Provide pt with energy conservation strategies and ask if pt is familiar with [place]. Initiate balance and strengthening HEP. Assess for dizziness prn.    Consulted and Agree with Plan of Care Patient      Patient will benefit from skilled therapeutic intervention in order to improve the following deficits and impairments:  Abnormal gait, Decreased endurance, Decreased knowledge of use of DME, Decreased balance, Decreased mobility, Dizziness, Postural dysfunction, Decreased strength, Impaired sensation  Visit Diagnosis: Other abnormalities of gait and mobility - Plan: PT plan of care cert/re-cert  Muscle weakness (generalized) - Plan: PT plan of care cert/re-cert  Dizziness and giddiness -  Plan: PT plan of care cert/re-cert     Problem List Patient Active Problem List   Diagnosis Date Noted  . Urinary  frequency 10/14/2015  . Left leg weakness 10/14/2015  . Lyme disease 11/03/2014  . Hypothyroidism 11/03/2014  . Dysesthesia 11/03/2014  . Other fatigue 11/03/2014  . Gait disturbance 11/03/2014  . MULTIPLE SCLEROSIS 09/01/2008  . VULVOVAGINITIS 09/01/2008    Wilmer Berryhill L 10/22/2015, 3:30 PM  [facility] [address] [place], [place], [address] Phone: [phone]   Fax:  [phone]  Name: Samantha Church MRN: [id number] Date of Birth: 05-16-1960  Geoffry Paradise, PT,DPT 10/22/15 3:30 PM Phone: [phone] Fax: [phone]

## 2015-10-26 ENCOUNTER — Ambulatory Visit: Payer: Managed Care, Other (non HMO) | Admitting: Physical Therapy

## 2015-10-26 DIAGNOSIS — R2689 Other abnormalities of gait and mobility: Secondary | ICD-10-CM

## 2015-10-26 DIAGNOSIS — M6281 Muscle weakness (generalized): Secondary | ICD-10-CM

## 2015-10-26 DIAGNOSIS — R42 Dizziness and giddiness: Secondary | ICD-10-CM

## 2015-10-26 NOTE — Patient Instructions (Addendum)
Energy Conservation Techniques  1. Sit for as many activities as possible. 2. Use slow, smooth movements.  Rushing increases discomfort. 3. Determine the necessity of performing the task.  Simplify those tasks that are necessary.  (Get clothes out of the dryer when they are warm instead of ironing, let dishes air dry, etc.) 4. Take frequent rests both during and between activities.  Avoid repetitive tasks. 5. Pre-plan your activities; try a daily and/or weekly schedule.  Spread out the activities that are most fatiguing (break up cleaning tasks over multiple days). 6. Remember to plan a balance of work, rest and recreation. 7. Consider the best time for each activity.  Do the most exertive task when you have the most energy. 8. Don't carry items if you can push them.  Slide, don't lift. Push, don't pull. 9. Utilize two hands when appropriate. 10. Maintain good posture and use proper body mechanics.  Avoid remaining in one position for too long.  When lifting, bend at the knees, not at the waist.  Exhale when bending down, inhale when straightening up.  Carry objects as close to your body and as near to the center of the pelvis.  11. Avoid wasted body movements (position yourself for the task so that you avoid bending, twisting, etc.                when possible). 12. Select the best working environment.  Consider lighting, ventilation, clothing, and equipment. 76. Organize your storage areas, making the items you use daily convenient.  Store heaviest items at waist            height.  Store frequently used items between shoulders and knee height.  Consider leaving frequently used       items on countertops.  (You can organize in storage baskets based on time used/purpose). 14. Feelings and emotions can be real causes of fatigue.  Try to avoid unnecessary worry, irritation, or                    frustration.  Avoid stress, it can also be a source of fatigue. 15. Get help from other people for  difficult tasks. 16. Explore equipment or items that may be able to do the job for you with greater ease.  (Electric can        openers, blenders, lightweight items for cleaning, etc.)    HOLD COUNTER FOR SUPPORT AS MUCH AS NEEDED  "I love a Parade" Lift    Using a chair if necessary, march in place lifting knee as high as you can.  Repeat 10 times on each leg alternating legs.  http://gt2.exer.us/345   Copyright  VHI. All rights reserved.   ABDUCTION: Standing (Active)    Stand, feet flat. Lift right leg out to side. Complete 10 times on each leg.    http://gtsc.exer.us/111   Copyright  VHI. All rights reserved.  HIP / KNEE: Extension - Standing    Squeeze glutes. Raise and lift leg backward. Keep knee straight or slightly bent. Repeat 10 times on each leg.  Hold onto a support.  Copyright  VHI. All rights reserved.   SINGLE LIMB STANCE    Stance: single leg on floor. Raise leg. Hold 10 seconds. Repeat with other leg. Do 2 times on each leg.  Copyright  VHI. All rights reserved.   Tandem Stance    Right foot in front of left, heel touching toe both feet "straight ahead". Stand on [employer] of Support  with both feet. Balance in this position 10 seconds. Do with left foot in front of right.  Do 2 times each direction.  Copyright  VHI. All rights reserved.   Hamstring Curl: Standing (Single Leg)    No tubing.  In shoulder width stance bend knee, foot toward buttock.  Alternate legs doing 10 times on each leg.    http://tub.exer.us/198   Copyright  VHI. All rights reserved.

## 2015-10-26 NOTE — Therapy (Signed)
[facility] [address] [place], [place], [address] Phone: [phone]   Fax:  [phone]  Physical Therapy Treatment  Patient Details  Name: Samantha Church MRN: [id number] Date of Birth: 1960-05-14 Referring Provider: Dr. Felecia Shelling  Encounter Date: 10/26/2015      PT End of Session - 10/26/15 1127    Visit Number 2   Number of Visits 9   Date for PT Re-Evaluation 11/21/15   Authorization Type [employer]-waiting on visit limit number   PT Start Time 0807   PT Stop Time 0848   PT Time Calculation (min) 41 min   Equipment Utilized During Treatment --  min guard to S prn   Activity Tolerance Patient tolerated treatment well   Behavior During Therapy [facility] for tasks assessed/performed      Past Medical History:  Diagnosis Date  . Anemia   . Lyme disease   . Multiple sclerosis ([place])   . Neuromuscular disorder ([place])    MS  . Vision abnormalities     Past Surgical History:  Procedure Laterality Date  . BREAST SURGERY    . DILATATION & CURRETTAGE/HYSTEROSCOPY WITH RESECTOCOPE N/A 10/09/2012   Procedure: DILATATION & CURETTAGE/HYSTEROSCOPY WITH RESECTOCOPE;  Surgeon: [name], MD;  Location: [facility];  Service: Gynecology;  Laterality: N/A;  RESECTION with Versapoint   . KNEE ARTHROSCOPY  1996    There were no vitals filed for this visit.      Subjective Assessment - 10/26/15 1122    Subjective Pt denies falls or changes since last visit.  Pt reports not being able to walk for longer than 5 minutes before L knee starts buckling/giving way and has to stop.   Pertinent History MS, Lyme Disease, hypothyroidism, anemia   Patient Stated Goals Be able to be stable and continue to walk.    Currently in Pain? No/denies       Standing at counter for bil alternating hip flexion, hamstring curl, hip extension, marching, SLR, hip abduction x 15 reps. Single limb stance x 10 sec bil sides x 2;Tandem stance x 10 sec bil  directions x 2 Heel raises x 20, toe raises x 20-with UE support  Provided HEP-see HEP for details    Scifit level 1.5 all 4 extremities for flexibility (pt reports stiffness) x 11 minutes.   Provided information on energy conservation and MS resources.           PT Education - 10/26/15 1125    Education provided Yes   Education Details Energy conservation techniques, MS resources (exercise options, MS society), HEP   Person(s) Educated Patient   Methods Explanation;Demonstration;Verbal cues;Handout   Comprehension Verbalized understanding;Returned demonstration          PT Short Term Goals - 10/22/15 1525      PT SHORT TERM GOAL #1   Title same as LTGs           PT Long Term Goals - 10/22/15 1525      PT LONG TERM GOAL #1   Title Pt will be IND with HEP in order to improve strength, balance and dizziness.    Status New     PT LONG TERM GOAL #2   Title Pt will improve FGA score from 19/30 to >/=27/30 to decr. falls risk.    Status New     PT LONG TERM GOAL #3   Title Pt will amb. 1000' over even/uneven terrain, with LRAD at MOD I level, while performing head turns/nods  in order to improve functional mobility and go for walks with spouse.    Status New     PT LONG TERM GOAL #4   Title Pt will amb. 500' over even terrain, IND, no AD without c/o L knee buckling in order to amb. at home and work safely.    Status New               Plan - 10/26/15 1127    Clinical Impression Statement Pt tolerated treatment well today and appears eager to improve strength and balance/gait.  Initiated HEP.  Continue PT per POC.   Rehab Potential Good   Clinical Impairments Affecting Rehab Potential MS, Lyme Disease, hypothyroidism, anemia   PT Frequency 2x / week   PT Duration 4 weeks   PT Treatment/Interventions ADLs/Self Care Home Management;Biofeedback;Canalith Repostioning;Electrical Stimulation;Neuromuscular re-education;Balance training;Therapeutic  exercise;Therapeutic activities;Functional mobility training;Stair training;Gait training;DME Instruction;Orthotic Fit/Training;Patient/family education;Vestibular;Energy conservation;Manual techniques   PT Next Visit Plan Review HEP and possibly add theraband to HEP.  Continue strengthening.  Assess for dizziness prn.    Consulted and Agree with Plan of Care Patient      Patient will benefit from skilled therapeutic intervention in order to improve the following deficits and impairments:  Abnormal gait, Decreased endurance, Decreased knowledge of use of DME, Decreased balance, Decreased mobility, Dizziness, Postural dysfunction, Decreased strength, Impaired sensation  Visit Diagnosis: Other abnormalities of gait and mobility  Muscle weakness (generalized)  Dizziness and giddiness     Problem List Patient Active Problem List   Diagnosis Date Noted  . Urinary frequency 10/14/2015  . Left leg weakness 10/14/2015  . Lyme disease 11/03/2014  . Hypothyroidism 11/03/2014  . Dysesthesia 11/03/2014  . Other fatigue 11/03/2014  . Gait disturbance 11/03/2014  . MULTIPLE SCLEROSIS 09/01/2008  . VULVOVAGINITIS 09/01/2008    Samantha Church 10/26/2015, 11:30 AM  [facility] [address] [place], [place], [address] Phone: [phone]   Fax:  [phone]  Name: Samantha Church MRN: [id number] Date of Birth: [birth year]

## 2015-10-28 ENCOUNTER — Ambulatory Visit: Payer: Managed Care, Other (non HMO) | Admitting: Physical Therapy

## 2015-10-28 DIAGNOSIS — R2689 Other abnormalities of gait and mobility: Secondary | ICD-10-CM | POA: Diagnosis not present

## 2015-10-28 DIAGNOSIS — M6281 Muscle weakness (generalized): Secondary | ICD-10-CM

## 2015-10-28 NOTE — Therapy (Signed)
[facility] [address] [place], [place], [address] Phone: [phone]   Fax:  [phone]  Physical Therapy Treatment  Patient Details  Name: Samantha Church MRN: [id number] Date of Birth: [birth year] Referring Provider: Dr. Felecia Shelling  Encounter Date: 10/28/2015      PT End of Session - 10/28/15 1345    Visit Number 3   Number of Visits 9   Date for PT Re-Evaluation 11/21/15   Authorization Type [employer]-waiting on visit limit number   PT Start Time 0935   PT Stop Time 1017   PT Time Calculation (min) 42 min   Activity Tolerance Patient tolerated treatment well   Behavior During Therapy [facility] for tasks assessed/performed      Past Medical History:  Diagnosis Date  . Anemia   . Lyme disease   . Multiple sclerosis ([place])   . Neuromuscular disorder ([place])    MS  . Vision abnormalities     Past Surgical History:  Procedure Laterality Date  . BREAST SURGERY    . DILATATION & CURRETTAGE/HYSTEROSCOPY WITH RESECTOCOPE N/A 10/09/2012   Procedure: DILATATION & CURETTAGE/HYSTEROSCOPY WITH RESECTOCOPE;  Surgeon: [name], MD;  Location: [facility];  Service: Gynecology;  Laterality: N/A;  RESECTION with Versapoint   . KNEE ARTHROSCOPY  1996    There were no vitals filed for this visit.      Subjective Assessment - 10/28/15 0939    Subjective Legs are tight and heavy   Pertinent History MS, Lyme Disease, hypothyroidism, anemia   Patient Stated Goals Be able to be stable and continue to walk.    Currently in Pain? No/denies         Treatment session focused on therapeutic exercises.  See HEP provided for details. Pt performed: Bridging x 15 reps Bridging with hip abd/add x 15 reps Bridging with hip flexion x 15 reps Prone hamstring curls x 10 LLE then x 10 with yellow theraband LLE Hooklying hip abduction with yellow theraband x 15 bil then x 15 on each side isolated to 1 LE Seated hamstring stretch bil x 20 sec  x 2 Supine hamstring stretch bil x 20 sec x 2 Sidelying clam with yellow theraband bil sides x 15 reps each          PT Education - 10/28/15 1344    Education provided Yes   Education Details HEP, continuing to self assess tolerance for exercises and activity and adjust accordingly   Person(s) Educated Patient   Methods Explanation;Demonstration;Verbal cues;Handout   Comprehension Verbalized understanding;Returned demonstration          PT Short Term Goals - 10/22/15 1525      PT SHORT TERM GOAL #1   Title same as LTGs           PT Long Term Goals - 10/22/15 1525      PT LONG TERM GOAL #1   Title Pt will be IND with HEP in order to improve strength, balance and dizziness.    Status New     PT LONG TERM GOAL #2   Title Pt will improve FGA score from 19/30 to >/=27/30 to decr. falls risk.    Status New     PT LONG TERM GOAL #3   Title Pt will amb. 1000' over even/uneven terrain, with LRAD at MOD I level, while performing head turns/nods in order to improve functional mobility and go for walks with spouse.    Status New     PT  LONG TERM GOAL #4   Title Pt will amb. 500' over even terrain, IND, no AD without c/o L knee buckling in order to amb. at home and work safely.    Status New               Plan - 10/28/15 1345    Clinical Impression Statement Provided additional HEP for pt today.  Pt reports performing HEP from prior session without issues.  Continue PT per POC.   Rehab Potential Good   Clinical Impairments Affecting Rehab Potential MS, Lyme Disease, hypothyroidism, anemia   PT Frequency 2x / week   PT Duration 4 weeks   PT Treatment/Interventions ADLs/Self Care Home Management;Biofeedback;Canalith Repostioning;Electrical Stimulation;Neuromuscular re-education;Balance training;Therapeutic exercise;Therapeutic activities;Functional mobility training;Stair training;Gait training;DME Instruction;Orthotic Fit/Training;Patient/family  education;Vestibular;Energy conservation;Manual techniques   PT Next Visit Plan Review HEP and possibly add theraband to standing exercises.  Try quadruped.  Assess for dizziness prn.    Consulted and Agree with Plan of Care Patient      Patient will benefit from skilled therapeutic intervention in order to improve the following deficits and impairments:  Abnormal gait, Decreased endurance, Decreased knowledge of use of DME, Decreased balance, Decreased mobility, Dizziness, Postural dysfunction, Decreased strength, Impaired sensation  Visit Diagnosis: Other abnormalities of gait and mobility  Muscle weakness (generalized)     Problem List Patient Active Problem List   Diagnosis Date Noted  . Urinary frequency 10/14/2015  . Left leg weakness 10/14/2015  . Lyme disease 11/03/2014  . Hypothyroidism 11/03/2014  . Dysesthesia 11/03/2014  . Other fatigue 11/03/2014  . Gait disturbance 11/03/2014  . MULTIPLE SCLEROSIS 09/01/2008  . VULVOVAGINITIS 09/01/2008    Samantha Church 10/28/2015, 1:47 PM  [facility] [address] [place], [place], [address] Phone: [phone]   Fax:  [phone]  Name: [name] MRN: [id number] Date of Birth: 1960/06/11  Samantha Church, [place] 10/28/15 1:50 PM Phone: [phone] Fax: [phone]

## 2015-10-28 NOTE — Patient Instructions (Signed)
Bridging    Slowly raise buttocks from floor, keeping stomach tight. Repeat 10 times per set. Do 1 sessions per day.  http://orth.exer.us/1097   Copyright  VHI. All rights reserved.    Bracing With March in Bridging (Hook-Lying)    With neutral spine, tighten pelvic floor and abdominals and hold. Lift bottom and hold, then march in place once on each side.  Lower bottom back down after march.  Do 10 times once a day.   Copyright  VHI. All rights reserved.   Leg Extension (Hamstring)    Sit toward front edge of chair, with leg out straight, heel on floor, toes pointing toward body. Keeping back straight, bend forward at hip, breathing out through pursed lips. Return, breathing in.  Hold the position for at least 30 seconds.  Do 3 times on each leg twice a day.  Copyright  VHI. All rights reserved.    Hamstring: Towel Stretch (Supine)    Lie on back. Loop towel around left foot, hip and knee at 90. Straighten knee and pull foot toward body. Hold 30 seconds. Relax. Repeat 2 times. Do 2 times a day. Repeat with other leg.    Copyright  VHI. All rights reserved.   Leg Curl: Prone (Single Leg)    Lying on stomach, anchor tubing around one foot. Loop around other ankle with twist. Bend same knee up. Repeat 10 times per set. Repeat with other leg. Do once a day.  http://tub.exer.us/200   Copyright  VHI. All rights reserved.   Hip Abduction: Side-Lying (Single Leg)    Lie on side with knees bent, tubing around thighs just above knees. Raise top leg, keeping knee bent. Repeat 10 times per set. Repeat on other side. Do once a day.  http://tub.exer.us/44   Copyright  VHI. All rights reserved.   Hip Abduction / Adduction: with Knee Flexion (Supine)    With both knees bent and band around knees, gently lower knees to side and return. Repeat 10 times per set. Do once a day.  Can also do with single leg at a time.  http://orth.exer.us/683   Copyright   VHI. All rights reserved.

## 2015-11-03 ENCOUNTER — Ambulatory Visit: Payer: Managed Care, Other (non HMO) | Admitting: Physical Therapy

## 2015-11-05 ENCOUNTER — Ambulatory Visit: Payer: Managed Care, Other (non HMO)

## 2015-11-09 ENCOUNTER — Ambulatory Visit: Payer: Managed Care, Other (non HMO) | Admitting: Physical Therapy

## 2015-11-11 ENCOUNTER — Ambulatory Visit: Payer: Managed Care, Other (non HMO) | Admitting: Physical Therapy

## 2015-11-12 ENCOUNTER — Ambulatory Visit: Payer: Managed Care, Other (non HMO) | Attending: Neurology

## 2015-11-12 DIAGNOSIS — R2689 Other abnormalities of gait and mobility: Secondary | ICD-10-CM

## 2015-11-12 DIAGNOSIS — M6281 Muscle weakness (generalized): Secondary | ICD-10-CM

## 2015-11-12 DIAGNOSIS — R42 Dizziness and giddiness: Secondary | ICD-10-CM | POA: Diagnosis present

## 2015-11-12 NOTE — Patient Instructions (Signed)
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  Repeat 10 times on each leg alternating legs. Perform 2 sets of 10 reps. Perform every other day (3 days per week).  http://gt2.exer.us/345   Copyright  VHI. All rights reserved.   ABDUCTION: Standing (Active)    Stand, feet flat, take half a step back with kicking leg, then kick leg out to the side. Repeat with other leg. Complete 2 sets of 10 reps on each leg.  Perform every other day (3 days per week). http://gtsc.exer.us/111   Copyright  VHI. All rights reserved.  HIP / KNEE: Extension - Standing    Hold onto a support at counter as needed. Squeeze glutes. Raise and lift leg backward. Keep knee straight or slightly bent. Perform 2 sets of 10 reps on each leg.  Perform every other day (3 days per week).  Copyright  VHI. All rights reserved.   Hamstring Curl: Standing (Single Leg)    Hold counter as needed. No tubing.  In shoulder width stance bend knee, foot toward buttock, make sure knee does not move forward towards cabinet.  Perform 2 sets of 10 reps with left leg and 3 sets of 10 reps with right leg.  http://tub.exer.us/198   Copyright  VHI. All rights reserved.

## 2015-11-12 NOTE — Therapy (Signed)
[facility] [address] [place], [place], [address] Phone: [phone]   Fax:  [phone]  Physical Therapy Treatment  Patient Details  Name: Samantha Church MRN: [id number] Date of Birth: 07-28-1960 Referring Provider: Dr. Felecia Shelling  Encounter Date: 11/12/2015      PT End of Session - 11/12/15 0925    Visit Number 4   Number of Visits 9   Date for PT Re-Evaluation 11/21/15   Authorization Type [employer]-waiting on visit limit number   PT Start Time [id number]  pt arrived late   PT Stop Time 0924   PT Time Calculation (min) 26 min   Activity Tolerance Patient tolerated treatment well   Behavior During Therapy [facility] for tasks assessed/performed      Past Medical History:  Diagnosis Date  . Anemia   . Lyme disease   . Multiple sclerosis ([place])   . Neuromuscular disorder ([place])    MS  . Vision abnormalities     Past Surgical History:  Procedure Laterality Date  . BREAST SURGERY    . DILATATION & CURRETTAGE/HYSTEROSCOPY WITH RESECTOCOPE N/A 10/09/2012   Procedure: DILATATION & CURETTAGE/HYSTEROSCOPY WITH RESECTOCOPE;  Surgeon: [name], MD;  Location: [facility];  Service: Gynecology;  Laterality: N/A;  RESECTION with Versapoint   . KNEE ARTHROSCOPY  1996    There were no vitals filed for this visit.      Subjective Assessment - 11/12/15 0900    Subjective Pt reported she was sick and on anti-biotics (for a cold) but feels better now. Pt reported she went to [place] last week and reported her LLE feels heavy after walking longer distances.    Pertinent History MS, Lyme Disease, hypothyroidism, anemia   Patient Stated Goals Be able to be stable and continue to walk.    Currently in Pain? No/denies          Therex: Pt reviewed and performed progressed standing strengthening HEP. Cues and demo for technique and S for safety. Cues to remind pt to allow time to decr. Fatigue, as this is a symptom of  MS.                       PT Education - 11/12/15 0924    Education provided Yes   Education Details PT progressed standing HEP as tolerated.   Person(s) Educated Patient   Methods Explanation;Demonstration;Verbal cues;Handout   Comprehension Returned demonstration;Verbalized understanding          PT Short Term Goals - 10/22/15 1525      PT SHORT TERM GOAL #1   Title same as LTGs           PT Long Term Goals - 11/12/15 0927      PT LONG TERM GOAL #1   Title Pt will be IND with HEP in order to improve strength, balance and dizziness. TARGET DATE FOR ALL LTGS: 11/19/15   Status On-going     PT LONG TERM GOAL #2   Title Pt will improve FGA score from 19/30 to >/=27/30 to decr. falls risk.    Status On-going     PT LONG TERM GOAL #3   Title Pt will amb. 1000' over even/uneven terrain, with LRAD at MOD I level, while performing head turns/nods in order to improve functional mobility and go for walks with spouse.    Status On-going     PT LONG TERM GOAL #4   Title Pt will amb. 500' over  even terrain, IND, no AD without c/o L knee buckling in order to amb. at home and work safely.    Status On-going               Plan - 11/12/15 0925    Clinical Impression Statement Pt demonstrated improved strength, as she was able to tolerate incr. sets of standing HEP. Pt's LLE continues to experience incr. weakness vs. RLE and required standing rest breaks 2/2 fatigue. Continue with POC.    Rehab Potential Good   Clinical Impairments Affecting Rehab Potential MS, Lyme Disease, hypothyroidism, anemia   PT Frequency 2x / week   PT Duration 4 weeks   PT Treatment/Interventions ADLs/Self Care Home Management;Biofeedback;Canalith Repostioning;Electrical Stimulation;Neuromuscular re-education;Balance training;Therapeutic exercise;Therapeutic activities;Functional mobility training;Stair training;Gait training;DME Instruction;Orthotic Fit/Training;Patient/family  education;Vestibular;Energy conservation;Manual techniques   PT Next Visit Plan Trial mini wall squat. Try quadruped.  Assess for dizziness prn. Begin to assess goals.    PT Home Exercise Plan Stretching/strengthening HEP   Consulted and Agree with Plan of Care Patient      Patient will benefit from skilled therapeutic intervention in order to improve the following deficits and impairments:  Abnormal gait, Decreased endurance, Decreased knowledge of use of DME, Decreased balance, Decreased mobility, Dizziness, Postural dysfunction, Decreased strength, Impaired sensation  Visit Diagnosis: Muscle weakness (generalized)  Other abnormalities of gait and mobility     Problem List Patient Active Problem List   Diagnosis Date Noted  . Urinary frequency 10/14/2015  . Left leg weakness 10/14/2015  . Lyme disease 11/03/2014  . Hypothyroidism 11/03/2014  . Dysesthesia 11/03/2014  . Other fatigue 11/03/2014  . Gait disturbance 11/03/2014  . MULTIPLE SCLEROSIS 09/01/2008  . VULVOVAGINITIS 09/01/2008    Harneet Noblett L 11/12/2015, 9:28 AM  [facility] [address] [place], [place], [address] Phone: [phone]   Fax:  [phone]  Name: Samantha Church MRN: [id number] Date of Birth: [birth year]  Geoffry Paradise, PT,DPT 11/12/15 9:29 AM Phone: [phone] Fax: [phone]

## 2015-11-16 ENCOUNTER — Ambulatory Visit: Payer: Managed Care, Other (non HMO)

## 2015-11-16 DIAGNOSIS — R2689 Other abnormalities of gait and mobility: Secondary | ICD-10-CM

## 2015-11-16 DIAGNOSIS — R42 Dizziness and giddiness: Secondary | ICD-10-CM

## 2015-11-16 DIAGNOSIS — M6281 Muscle weakness (generalized): Secondary | ICD-10-CM

## 2015-11-16 NOTE — Patient Instructions (Signed)
FUNCTIONAL MOBILITY: Wall Squat    Stance: shoulder-width on floor, against wall. Place feet in front of hips. Bend hips and knees to about 45 degrees. Keep back straight. Do not allow knees to bend past toes. Hold for 5 seconds. Squeeze glutes and quads to stand. _5__ reps per set, _2__ sets per day, _2-3__ days per week  Copyright  VHI. All rights reserved.

## 2015-11-16 NOTE — Therapy (Signed)
[facility] [address] [place], [place], [address] Phone: [phone]   Fax:  [phone]  Physical Therapy Treatment  Patient Details  Name: Samantha Church MRN: [id number] Date of Birth: April 26, 1960 Referring Provider: Dr. Felecia Shelling  Encounter Date: 11/16/2015      PT End of Session - 11/16/15 1529    Visit Number 5   Number of Visits 9   Date for PT Re-Evaluation 11/21/15   Authorization Type A[employer]-waiting on visit limit number   PT Start Time [id number]   PT Stop Time 1358   PT Time Calculation (min) 41 min   Equipment Utilized During Treatment --  min guard prn   Activity Tolerance Patient tolerated treatment well   Behavior During Therapy [facility] for tasks assessed/performed      Past Medical History:  Diagnosis Date  . Anemia   . Lyme disease   . Multiple sclerosis ([place])   . Neuromuscular disorder ([place])    MS  . Vision abnormalities     Past Surgical History:  Procedure Laterality Date  . BREAST SURGERY    . DILATATION & CURRETTAGE/HYSTEROSCOPY WITH RESECTOCOPE N/A 10/09/2012   Procedure: DILATATION & CURETTAGE/HYSTEROSCOPY WITH RESECTOCOPE;  Surgeon: [name], MD;  Location: [facility];  Service: Gynecology;  Laterality: N/A;  RESECTION with Versapoint   . KNEE ARTHROSCOPY  1996    There were no vitals filed for this visit.      Subjective Assessment - 11/16/15 1319    Subjective Pt denied falls or changes since last visit. Pt reported no pain but "heavy legs" today.    Pertinent History MS, Lyme Disease, hypothyroidism, anemia   Patient Stated Goals Be able to be stable and continue to walk.    Currently in Pain? No/denies                Vestibular Assessment - 11/16/15 1350      Symptom Behavior   Type of Dizziness Spinning  and wooziness (like getting off the teacup ride)   Frequency of Dizziness Every day while walking   Duration of Dizziness Wooziness lasts during duration of amb.  but ceases within a few seconds after ceasing amb.   Aggravating Factors --  walking   Relieving Factors Rest     Occulomotor Exam   Occulomotor Alignment Normal   Spontaneous Absent   Gaze-induced Absent   Smooth Pursuits Saccades  Saccades during horizontal smooth pursuits (B)   Saccades Intact   Comment Pt reported dizziness (2/10) and corrective saccades noted during B head thrust (+).     Vestibulo-Occular Reflex   VOR 1 Head Only (x 1 viewing) 2-3/10 dizziness                  [facility] Adult PT Treatment/Exercise - 11/16/15 1524      Exercises   Exercises Knee/Hip;Other Exercises   Other Exercises  Pt performed squats starting in tall kneeling position x12 reps with cues to have wt. in midline vs. to R side, x12 reps with tactile cues, VCs, and manual facilitation. Pt performed in quadruped with manual facilitation and VC and demo for technique, with TrA activation: alternating B shoulder flex x10 reps, alternating B hip/knee ext x10 reps, x5 reps of B shoulder and B hip ext (alternating) x5 reps iwth R UE and Church LE and x1 rep with Church UE and R LE (poor technique and incr. Lumbar lordosis).       Knee/Hip Exercises: Standing  Wall Squat 2 sets;5 reps;5 seconds   Wall Squat Limitations Cues for technique and S for safety.                PT Education - 11/16/15 1528    Education provided Yes   Education Details PT discussed vestibular assessment findings and provided pt with strengthening HEP. PT educated pt that PT would provide gaze stabilization next session and assess goals but likely renew for add'Church visits as pt missed one week of therapy and to address dizziness/balance.    Person(s) Educated Patient   Methods Explanation;Demonstration;Verbal cues;Handout   Comprehension Verbalized understanding;Returned demonstration          PT Short Term Goals - 10/22/15 1525      PT SHORT TERM GOAL #1   Title same as LTGs           PT Long Term Goals -  11/12/15 0927      PT LONG TERM GOAL #1   Title Pt will be IND with HEP in order to improve strength, balance and dizziness. TARGET DATE FOR ALL LTGS: 11/19/15   Status On-going     PT LONG TERM GOAL #2   Title Pt will improve FGA score from 19/30 to >/=27/30 to decr. falls risk.    Status On-going     PT LONG TERM GOAL #3   Title Pt will amb. 1000' over even/uneven terrain, with LRAD at MOD I level, while performing head turns/nods in order to improve functional mobility and go for walks with spouse.    Status On-going     PT LONG TERM GOAL #4   Title Pt will amb. 500' over even terrain, IND, no AD without c/o Church knee buckling in order to amb. at home and work safely.    Status On-going               Plan - 11/16/15 1529    Clinical Impression Statement Pt's vestibular assessment findings were consistent with vestibular hypofunctioning, along with likely central etiology based on s/s and pt's hx of MS. PT will provide pt with gaze stabilization next time, as PT was unable to today 2/2 time constraints. Pt demonstrated improved strength and endurance as she was able to perform strengthening activities with less rest breaks. Continue with POC.     Rehab Potential Good   Clinical Impairments Affecting Rehab Potential MS, Lyme Disease, hypothyroidism, anemia   PT Frequency 2x / week   PT Duration 4 weeks   PT Treatment/Interventions ADLs/Self Care Home Management;Biofeedback;Canalith Repostioning;Electrical Stimulation;Neuromuscular re-education;Balance training;Therapeutic exercise;Therapeutic activities;Functional mobility training;Stair training;Gait training;DME Instruction;Orthotic Fit/Training;Patient/family education;Vestibular;Energy conservation;Manual techniques   PT Next Visit Plan Provide with gaze stab. HEP, Begin to assess goals (likely renew)   PT Home Exercise Plan Stretching/strengthening HEP   Consulted and Agree with Plan of Care Patient      Patient will  benefit from skilled therapeutic intervention in order to improve the following deficits and impairments:  Abnormal gait, Decreased endurance, Decreased knowledge of use of DME, Decreased balance, Decreased mobility, Dizziness, Postural dysfunction, Decreased strength, Impaired sensation  Visit Diagnosis: Muscle weakness (generalized)  Other abnormalities of gait and mobility  Dizziness and giddiness     Problem List Patient Active Problem List   Diagnosis Date Noted  . Urinary frequency 10/14/2015  . Left leg weakness 10/14/2015  . Lyme disease 11/03/2014  . Hypothyroidism 11/03/2014  . Dysesthesia 11/03/2014  . Other fatigue 11/03/2014  . Gait disturbance 11/03/2014  . MULTIPLE SCLEROSIS  09/01/2008  . VULVOVAGINITIS 09/01/2008    Samantha Church 11/16/2015, 3:33 PM  [facility] [address] [place], [place], [address] Phone: [phone]   Fax:  [phone]  Name: Samantha Church MRN: [id number] Date of Birth: [birth year]  Geoffry Paradise, PT,DPT 11/16/15 3:33 PM Phone: [phone] Fax: [phone]

## 2015-11-18 ENCOUNTER — Ambulatory Visit: Payer: Managed Care, Other (non HMO)

## 2015-11-18 DIAGNOSIS — M6281 Muscle weakness (generalized): Secondary | ICD-10-CM

## 2015-11-18 DIAGNOSIS — R42 Dizziness and giddiness: Secondary | ICD-10-CM

## 2015-11-18 DIAGNOSIS — R2689 Other abnormalities of gait and mobility: Secondary | ICD-10-CM

## 2015-11-18 NOTE — Patient Instructions (Signed)
Gaze Stabilization: Tip Card  1.Target must remain in focus, not blurry, and appear stationary while head is in motion. 2.Perform exercises with small head movements (45 to either side of midline). 3.Increase speed of head motion so long as target is in focus. 4.If you wear eyeglasses, be sure you can see target through lens (therapist will give specific instructions for bifocal / progressive lenses). 5.These exercises may provoke dizziness or nausea. Work through these symptoms. If too dizzy, slow head movement slightly. Rest between each exercise. 6.Exercises demand concentration; avoid distractions. 7.For safety, perform standing exercises close to a counter, wall, corner, or next to someone.  Copyright  VHI. All rights reserved.   Gaze Stabilization: Standing Feet Together    Feet together, keeping eyes on target on wall __3-5__ feet away, tilt head down 15-30 and move head side to side for __20__ seconds. Repeat while moving head up and down for __20__ seconds. Do __3__ sessions per day.  Copyright  VHI. All rights reserved.

## 2015-11-18 NOTE — Addendum Note (Signed)
Addended by: Elza Rafter on: 11/18/2015 01:26 PM   Modules accepted: Orders

## 2015-11-18 NOTE — Therapy (Signed)
[facility] [address] [place], [place], [address] Phone: [phone]   Fax:  [phone]  Physical Therapy Treatment  Patient Details  Name: Samantha Church MRN: [id number] Date of Birth: 11/08/1960 Referring Provider: Dr. Felecia Shelling  Encounter Date: 11/18/2015      PT End of Session - 11/18/15 1150    Visit Number 6   Number of Visits 9   Date for PT Re-Evaluation 11/21/15  PT requesting add'l 2x/week for 4 weeks,new POC date: 12/21/15   Authorization Type [employer]-waiting on visit limit number   PT Start Time 1102   PT Stop Time 1143   PT Time Calculation (min) 41 min   Equipment Utilized During Treatment Gait belt   Activity Tolerance Patient tolerated treatment well   Behavior During Therapy [facility] for tasks assessed/performed      Past Medical History:  Diagnosis Date  . Anemia   . Lyme disease   . Multiple sclerosis ([place])   . Neuromuscular disorder ([place])    MS  . Vision abnormalities     Past Surgical History:  Procedure Laterality Date  . BREAST SURGERY    . DILATATION & CURRETTAGE/HYSTEROSCOPY WITH RESECTOCOPE N/A 10/09/2012   Procedure: DILATATION & CURETTAGE/HYSTEROSCOPY WITH RESECTOCOPE;  Surgeon: [name], MD;  Location: [facility];  Service: Gynecology;  Laterality: N/A;  RESECTION with Versapoint   . KNEE ARTHROSCOPY  1996    There were no vitals filed for this visit.      Subjective Assessment - 11/18/15 1104    Subjective Pt denied falls or changes since last visit. Pt reports dizziness is still the same since last session. Pt will have to miss next week, as she's out of town for work.    Pertinent History MS, Lyme Disease, hypothyroidism, anemia   Patient Stated Goals Be able to be stable and continue to walk.    Currently in Pain? No/denies            [facility] PT Assessment - 11/18/15 1133      Functional Gait  Assessment   Gait assessed  Yes   Gait Level Surface Walks 20 ft in less  than 7 sec but greater than 5.5 sec, uses assistive device, slower speed, mild gait deviations, or deviates 6-10 in outside of the 12 in walkway width.  6.5 sec.   Change in Gait Speed Able to smoothly change walking speed without loss of balance or gait deviation. Deviate no more than 6 in outside of the 12 in walkway width.   Gait with Horizontal Head Turns Performs head turns smoothly with slight change in gait velocity (eg, minor disruption to smooth gait path), deviates 6-10 in outside 12 in walkway width, or uses an assistive device.   Gait with Vertical Head Turns Performs task with slight change in gait velocity (eg, minor disruption to smooth gait path), deviates 6 - 10 in outside 12 in walkway width or uses assistive device   Gait and Pivot Turn Pivot turns safely within 3 sec and stops quickly with no loss of balance.   Step Over Obstacle Is able to step over 2 stacked shoe boxes taped together (9 in total height) without changing gait speed. No evidence of imbalance.   Gait with Narrow Base of Support Ambulates 7-9 steps.   Gait with Eyes Closed Walks 20 ft, uses assistive device, slower speed, mild gait deviations, deviates 6-10 in outside 12 in walkway width. Ambulates 20 ft in less than 9 sec  but greater than 7 sec.   Ambulating Backwards Walks 20 ft, uses assistive device, slower speed, mild gait deviations, deviates 6-10 in outside 12 in walkway width.   Steps Alternating feet, no rail.   Total Score 24                     [facility] Adult PT Treatment/Exercise - 11/18/15 1133      Ambulation/Gait   Ambulation/Gait Yes   Ambulation/Gait Assistance 5: Supervision;7: Independent   Ambulation/Gait Assistance Details S during last 100' of amb., as pt reported incr. difficulty lifting L toes due to fatigue. Pt denied sensation of L knee buckling during amb.    Ambulation Distance (Feet) 575 Feet   Assistive device None   Gait Pattern Step-through pattern;Decreased  dorsiflexion - left;Decreased stance time - left;Decreased stride length   Ambulation Surface Level;Indoor         Vestibular Treatment/Exercise - 11/18/15 1106      Vestibular Treatment/Exercise   Vestibular Treatment Provided Gaze   Habituation Exercises Seated Horizontal Head Turns;Seated Vertical Head Turns;Standing Vertical Head Turns;Standing Horizontal Head Turns     Seated Horizontal Head Turns   Number of Reps  3   Symptom Description  pt reported 1-2/10 dizziness during gaze stabilization and cues/demo for technique.  Pt performed      Seated Vertical Head Turns   Number of Reps  3   Symptom Description  1-2/10 dizziness, cues and demo for technique.     Standing Horizontal Head Turns   Number of Reps  3   Symptom Description  1-2/10 dizziness, incr. postural sway noted with feet together vs. apart.      Standing Vertical Head Turns   Number of Reps  2   Symptom Description  Pt reported 1-2/10 dizziness and incr. postural sway noted with feet together (performed with feet apart/together).     Please see HEP for details.            PT Education - 11/18/15 1149    Education provided Yes   Education Details PT discussed goal progress and renewing POC for add'l 2x/week for 4 weeks and provided pt with gaze stabilization HEP in standing with feet together.    Person(s) Educated Patient   Methods Explanation;Demonstration;Verbal cues;Handout   Comprehension Returned demonstration;Verbalized understanding          PT Short Term Goals - 10/22/15 1525      PT SHORT TERM GOAL #1   Title same as LTGs           PT Long Term Goals - 11/18/15 1157      PT LONG TERM GOAL #1   Title Pt will be IND with HEP in order to improve strength, balance and dizziness. TARGET DATE FOR ALL LTGS: 11/19/15   Baseline ALL UNMET GOALS WILL BE CARRIED OVER TO NEW POC: 12/21/15   Status Revised     PT LONG TERM GOAL #2   Title Pt will improve FGA score from 19/30 to >/=27/30  to decr. falls risk.    Status Partially Met     PT LONG TERM GOAL #3   Title Pt will amb. 1000' over even/uneven terrain, with LRAD at MOD I level, while performing head turns/nods in order to improve functional mobility and go for walks with spouse.    Status On-going     PT LONG TERM GOAL #4   Title Pt will amb. 500' over even terrain, IND, no AD without  c/o L knee buckling in order to amb. at home and work safely.    Status Achieved     PT LONG TERM GOAL #5   Title Pt will report dizziness no greater than 1/10 during amb. (500') in order to improve safety during functional mobility.                Plan - 11/18/15 1155    Clinical Impression Statement Pt met LTG 5 and partially met LTG 2. LTG 3 will be assessed next session, as unable to amb. outside 2/2 rain. LTG 1 deferred until d/c. Pt would continue to benefit from skilled PT to improve safety during functional mobility and reduce dizziness. PT requesting add'l 2x/week for 4 weeks.    Rehab Potential Good   Clinical Impairments Affecting Rehab Potential MS, Lyme Disease, hypothyroidism, anemia   PT Frequency 2x / week   PT Duration 4 weeks   PT Treatment/Interventions ADLs/Self Care Home Management;Biofeedback;Canalith Repostioning;Electrical Stimulation;Neuromuscular re-education;Balance training;Therapeutic exercise;Therapeutic activities;Functional mobility training;Stair training;Gait training;DME Instruction;Orthotic Fit/Training;Patient/family education;Vestibular;Energy conservation;Manual techniques   PT Next Visit Plan Assess LTG 3, dynamic gait with head turns/nods and gaze stab. during gait as tolerated.    PT Home Exercise Plan Stretching/strengthening HEP   Consulted and Agree with Plan of Care Patient      Patient will benefit from skilled therapeutic intervention in order to improve the following deficits and impairments:  Abnormal gait, Decreased endurance, Decreased knowledge of use of DME, Decreased  balance, Decreased mobility, Dizziness, Postural dysfunction, Decreased strength, Impaired sensation  Visit Diagnosis: Other abnormalities of gait and mobility  Dizziness and giddiness  Muscle weakness (generalized)     Problem List Patient Active Problem List   Diagnosis Date Noted  . Urinary frequency 10/14/2015  . Left leg weakness 10/14/2015  . Lyme disease 11/03/2014  . Hypothyroidism 11/03/2014  . Dysesthesia 11/03/2014  . Other fatigue 11/03/2014  . Gait disturbance 11/03/2014  . MULTIPLE SCLEROSIS 09/01/2008  . VULVOVAGINITIS 09/01/2008    Kyaira Trantham L 11/18/2015, 11:59 AM  [facility] [address] [place], [place], [address] Phone: [phone]   Fax:  [phone]  Name: Samantha Church MRN: [id number] Date of Birth: March 16, 1960  Geoffry Paradise, PT,DPT 11/18/15 12:00 PM Phone: [phone] Fax: [phone]

## 2015-11-23 ENCOUNTER — Ambulatory Visit: Payer: Managed Care, Other (non HMO) | Admitting: Physical Therapy

## 2015-11-25 ENCOUNTER — Ambulatory Visit: Payer: Managed Care, Other (non HMO) | Admitting: Physical Therapy

## 2015-12-07 ENCOUNTER — Ambulatory Visit: Payer: Managed Care, Other (non HMO)

## 2015-12-07 DIAGNOSIS — R2689 Other abnormalities of gait and mobility: Secondary | ICD-10-CM

## 2015-12-07 DIAGNOSIS — M6281 Muscle weakness (generalized): Secondary | ICD-10-CM | POA: Diagnosis not present

## 2015-12-07 DIAGNOSIS — R42 Dizziness and giddiness: Secondary | ICD-10-CM

## 2015-12-07 NOTE — Therapy (Signed)
[facility] [address] [place], [place], [address] Phone: [phone]   Fax:  [phone]  Physical Therapy Treatment  Patient Details  Name: Samantha Church MRN: [id number] Date of Birth: 1960/05/18 Referring Provider: Dr. Felecia Shelling  Encounter Date: 12/07/2015      PT End of Session - 12/07/15 0849    Visit Number 7   Number of Visits 9   Date for PT Re-Evaluation 12/21/15   Authorization Type Aetna   PT Start Time 0806   PT Stop Time 0846   PT Time Calculation (min) 40 min   Equipment Utilized During Treatment Gait belt   Activity Tolerance Patient tolerated treatment well   Behavior During Therapy [facility] for tasks assessed/performed      Past Medical History:  Diagnosis Date  . Anemia   . Lyme disease   . Multiple sclerosis ([place])   . Neuromuscular disorder ([place])    MS  . Vision abnormalities     Past Surgical History:  Procedure Laterality Date  . BREAST SURGERY    . DILATATION & CURRETTAGE/HYSTEROSCOPY WITH RESECTOCOPE N/A 10/09/2012   Procedure: DILATATION & CURETTAGE/HYSTEROSCOPY WITH RESECTOCOPE;  Surgeon: [name], MD;  Location: [facility];  Service: Gynecology;  Laterality: N/A;  RESECTION with Versapoint   . KNEE ARTHROSCOPY  1996    There were no vitals filed for this visit.      Subjective Assessment - 12/07/15 0808    Subjective Pt denied falls or changes since last visit. Pt reports dizziness is still there but slightly better.    Pertinent History MS, Lyme Disease, hypothyroidism, anemia   Patient Stated Goals Be able to be stable and continue to walk.    Currently in Pain? No/denies                         [facility] Adult PT Treatment/Exercise - 12/07/15 0810      Ambulation/Gait   Ambulation/Gait Yes   Ambulation/Gait Assistance 5: Supervision;6: Modified independent (Device/Increase time)   Ambulation/Gait Assistance Details Pt performed head turns/nods over  even/uneven terrain.   Ambulation Distance (Feet) 1000 Feet   Assistive device None   Gait Pattern Step-through pattern;Decreased dorsiflexion - left;Decreased stance time - left;Decreased stride length   Ambulation Surface Level;Unlevel;Indoor;Outdoor;Paved             Balance Exercises - 12/07/15 0846      Balance Exercises: Standing   Rockerboard Anterior/posterior;Lateral;Head turns;EO;EC;10 seconds;10 reps;Intermittent UE support   Tandem Gait Forward;Foam/compliant surface;4 reps;Intermittent upper extremity support  tandem on foam beam   Sidestepping Foam/compliant support;4 reps  intermittent UE support   Other Standing Exercises Cues for technique and to keep weight in midline vs. shifted onto RLE. Pt reported 3-4/10 dizziness which subsided with 2 seated rest breaks. Min guard to min A to maintain balance and ensure safety. Pt also performed gait with eyes closed at counter with 1 UE support 4x7' to improve balance, plus balance HEP. PLease see balance HEP for details. All performed with min guard to S.              PT Short Term Goals - 10/22/15 1525      PT SHORT TERM GOAL #1   Title same as LTGs           PT Long Term Goals - 12/07/15 0851      PT LONG TERM GOAL #1   Title Pt will be IND with HEP  in order to improve strength, balance and dizziness. TARGET DATE FOR ALL LTGS: 12/21/15   Status On-going     PT LONG TERM GOAL #2   Title Pt will improve FGA score from 19/30 to >/=27/30 to decr. falls risk.    Status On-going     PT LONG TERM GOAL #3   Title Pt will amb. 1000' over even/uneven terrain, with LRAD at MOD I level, while performing head turns/nods in order to improve functional mobility and go for walks with spouse.    Status Partially Met     PT LONG TERM GOAL #4   Title Pt will amb. 1000' over even terrain, IND, no AD without c/o L knee buckling in order to amb. at home and work safely.    Status Revised     PT LONG TERM GOAL #5   Title  Pt will report dizziness no greater than 1/10 during amb. (500') in order to improve safety during functional mobility.    Status On-going               Plan - 12/07/15 0850    Clinical Impression Statement Pt partially met LTG 3. PT will revise or carry over met and unmet goals to new POC 12/21/15. Pt continues to experience incr. postural sway and LOB during activities which require incr. vestibular input. Continue with POC.    Rehab Potential Good   Clinical Impairments Affecting Rehab Potential MS, Lyme Disease, hypothyroidism, anemia   PT Frequency 2x / week   PT Duration 4 weeks   PT Treatment/Interventions ADLs/Self Care Home Management;Biofeedback;Canalith Repostioning;Electrical Stimulation;Neuromuscular re-education;Balance training;Therapeutic exercise;Therapeutic activities;Functional mobility training;Stair training;Gait training;DME Instruction;Orthotic Fit/Training;Patient/family education;Vestibular;Energy conservation;Manual techniques   PT Next Visit Plan Dynamic gait and balance activities to challenge vestibular system.    PT Home Exercise Plan Balance and Stretching/strengthening HEP   Consulted and Agree with Plan of Care Patient      Patient will benefit from skilled therapeutic intervention in order to improve the following deficits and impairments:  Abnormal gait, Decreased endurance, Decreased knowledge of use of DME, Decreased balance, Decreased mobility, Dizziness, Postural dysfunction, Decreased strength, Impaired sensation  Visit Diagnosis: Dizziness and giddiness  Other abnormalities of gait and mobility     Problem List Patient Active Problem List   Diagnosis Date Noted  . Urinary frequency 10/14/2015  . Left leg weakness 10/14/2015  . Lyme disease 11/03/2014  . Hypothyroidism 11/03/2014  . Dysesthesia 11/03/2014  . Other fatigue 11/03/2014  . Gait disturbance 11/03/2014  . MULTIPLE SCLEROSIS 09/01/2008  . VULVOVAGINITIS 09/01/2008     Breeana Sawtelle L 12/07/2015, 8:52 AM  [facility] [address] [place], [place], [address] Phone: [phone]   Fax:  [phone]  Name: Samantha Church MRN: [id number] Date of Birth: 09/06/1960  Geoffry Paradise, PT,DPT 12/07/15 8:52 AM Phone: [phone] Fax: [phone]

## 2015-12-07 NOTE — Patient Instructions (Addendum)
Perform at counter with hand on counter for support as needed.  Up / Down Head Motion    Walking on solid surface, move head and eyes toward ceiling for __2__ steps. Then, move head and eyes toward floor for __2__ steps. Repeat __4__ times per session. Do __1__ sessions per day.  Copyright  VHI. All rights reserved.  Side to Side Head Motion    Walking on solid surface, turn head and eyes to left for __2__ steps. Then, turn head and eyes to opposite side for __2__ steps. Repeat sequence __4__ times per session. Do __1__ sessions per day.  Copyright  VHI. All rights reserved.

## 2015-12-09 ENCOUNTER — Ambulatory Visit: Payer: Managed Care, Other (non HMO)

## 2015-12-09 DIAGNOSIS — R42 Dizziness and giddiness: Secondary | ICD-10-CM

## 2015-12-09 DIAGNOSIS — M6281 Muscle weakness (generalized): Secondary | ICD-10-CM

## 2015-12-09 DIAGNOSIS — R2689 Other abnormalities of gait and mobility: Secondary | ICD-10-CM

## 2015-12-09 NOTE — Patient Instructions (Addendum)
  Turning in Place: Compensatory Strategy    Standing in place, first move eyes to target at eye level located to right side. Keeping eyes on target, turn head then body toward target. Repeat sequence with target on each wall to complete half turn. Repeat __5__ times per session. Do __1__ sessions per day.  Copyright  VHI. All rights reserved.   Piriformis Stretch, Supine    Lie supine, right ankle crossed onto opposite knee, then gently push top knee away from body.  Hold _30__ seconds.  For deeper stretch holding bottom leg behind knee, gently pull legs toward chest until stretch is felt in buttock of top leg Repeat __3_ times per session. Do _2-3__ sessions per day.  Copyright  VHI. All rights reserved.   If stretch above is too challenging, please perform the stretch below.  Piriformis Stretch    Lying on back, pull right knee toward opposite shoulder. Hold __30__ seconds. Repeat __3__ times. Do __2-3__ sessions per day.  http://gt2.exer.us/258   Copyright  VHI. All rights reserved.

## 2015-12-09 NOTE — Therapy (Signed)
[facility] [address] [place], [place], [address] Phone: [phone]   Fax:  [phone]  Physical Therapy Treatment  Patient Details  Name: Samantha Church MRN: [id number] Date of Birth: 29-Apr-1960 Referring Provider: Dr. Felecia Shelling  Encounter Date: 12/09/2015      PT End of Session - 12/09/15 0858    Visit Number 8   Number of Visits 9   Date for PT Re-Evaluation 12/21/15   Authorization Type Aet[employer]   PT Start Time [phone]  pt late and then used restroom   PT Stop Time 0846   PT Time Calculation (min) 39 min   Equipment Utilized During Treatment --  min guard to min A prn   Activity Tolerance Patient tolerated treatment well   Behavior During Therapy [facility] for tasks assessed/performed      Past Medical History:  Diagnosis Date  . Anemia   . Lyme disease   . Multiple sclerosis ([place])   . Neuromuscular disorder ([place])    MS  . Vision abnormalities     Past Surgical History:  Procedure Laterality Date  . BREAST SURGERY    . DILATATION & CURRETTAGE/HYSTEROSCOPY WITH RESECTOCOPE N/A 10/09/2012   Procedure: DILATATION & CURETTAGE/HYSTEROSCOPY WITH RESECTOCOPE;  Surgeon: [name], MD;  Location: [facility];  Service: Gynecology;  Laterality: N/A;  RESECTION with Versapoint   . KNEE ARTHROSCOPY  1996    There were no vitals filed for this visit.      Subjective Assessment - 12/09/15 0809    Subjective Pt reported she's been experiencing incr. RLE pain over the last 3 weeks (she's unable to lie on R side, starts at hip and travels down RLE-lateral side from hip to ankle). Pt doesn't have pain during the day but feels it at night and during massage last week.  Pt reported N/T in B foot, which is intermittent and a chronic issue.   Pertinent History MS, Lyme Disease, hypothyroidism, anemia   Patient Stated Goals Be able to be stable and continue to walk.    Currently in Pain? No/denies        Therex: Pt  performed in supine with cues and demo for technique. Please see pt instructions for details.                       Balance Exercises - 12/09/15 0854      Balance Exercises: Standing   Standing Eyes Opened Foam/compliant surface;Other reps (comment);10 secs;30 secs;Wide (BOA);Narrow base of support (BOS);Head turns;3 reps  airex pad, 10 reps with head turns   Standing Eyes Closed Narrow base of support (BOS);Wide (BOA);Foam/compliant surface;10 secs;Other reps (comment)  6 reps on airex pad   Other Standing Exercises Pt performed balance on airex pad with min guard and cue to ensure equal weight through LEs, as pt has tendency to lean on RLE. Pt also performed 3x10 reps ball toss on airex pad. Pt reported 2-3/10 dizziness after balance activities which ceased with seated rest break in <30 sec. Pt also amb. 4x50' while performing ball toss with cues for technique.  Pt performed 1/4 and 1/2 turns with targets x10 reps, please see pt instructions for details. Performed with S and cues for technique.            PT Education - 12/09/15 0857    Education provided Yes   Education Details PT discussed R hip/LE findings consistent with decr. piriformis flexibility and provided pt with stretches to improve  pain and flexibility, as this can impact functional mobility. PT also provided pt with turn HEP to reduce dizziness.    Person(s) Educated Patient   Methods Explanation;Demonstration;Verbal cues;Handout   Comprehension Returned demonstration;Verbalized understanding          PT Short Term Goals - 10/22/15 1525      PT SHORT TERM GOAL #1   Title same as LTGs           PT Long Term Goals - 12/07/15 0851      PT LONG TERM GOAL #1   Title Pt will be IND with HEP in order to improve strength, balance and dizziness. TARGET DATE FOR ALL LTGS: 12/21/15   Status On-going     PT LONG TERM GOAL #2   Title Pt will improve FGA score from 19/30 to >/=27/30 to decr. falls risk.     Status On-going     PT LONG TERM GOAL #3   Title Pt will amb. 1000' over even/uneven terrain, with LRAD at MOD I level, while performing head turns/nods in order to improve functional mobility and go for walks with spouse.    Status Partially Met     PT LONG TERM GOAL #4   Title Pt will amb. 1000' over even terrain, IND, no AD without c/o L knee buckling in order to amb. at home and work safely.    Status Revised     PT LONG TERM GOAL #5   Title Pt will report dizziness no greater than 1/10 during amb. (500') in order to improve safety during functional mobility.    Status On-going               Plan - 12/09/15 1941    Clinical Impression Statement PT performed R hip exam and findings consistent with decr. piriformis flexibility which could impact functional mobility. Pt's R hip AROM WFL except for ER limited 2/2 pain, Scour test (-), pt TTP to R piriformis and decr. piriformis flexibility noted, with improvements after stretches to piriformis. Pt tolerated HEP well. Pt continues to require min guard to min A during activities which challenge vestibular. Continue with POC.    Rehab Potential Good   Clinical Impairments Affecting Rehab Potential MS, Lyme Disease, hypothyroidism, anemia   PT Frequency 2x / week   PT Duration 4 weeks   PT Treatment/Interventions ADLs/Self Care Home Management;Biofeedback;Canalith Repostioning;Electrical Stimulation;Neuromuscular re-education;Balance training;Therapeutic exercise;Therapeutic activities;Functional mobility training;Stair training;Gait training;DME Instruction;Orthotic Fit/Training;Patient/family education;Vestibular;Energy conservation;Manual techniques   PT Next Visit Plan Continue Dynamic gait and balance activities to challenge vestibular system, (turns).   PT Home Exercise Plan Balance and Stretching/strengthening HEP   Consulted and Agree with Plan of Care Patient      Patient will benefit from skilled therapeutic intervention  in order to improve the following deficits and impairments:  Abnormal gait, Decreased endurance, Decreased knowledge of use of DME, Decreased balance, Decreased mobility, Dizziness, Postural dysfunction, Decreased strength, Impaired sensation  Visit Diagnosis: Other abnormalities of gait and mobility  Dizziness and giddiness  Muscle weakness (generalized)     Problem List Patient Active Problem List   Diagnosis Date Noted  . Urinary frequency 10/14/2015  . Left leg weakness 10/14/2015  . Lyme disease 11/03/2014  . Hypothyroidism 11/03/2014  . Dysesthesia 11/03/2014  . Other fatigue 11/03/2014  . Gait disturbance 11/03/2014  . MULTIPLE SCLEROSIS 09/01/2008  . VULVOVAGINITIS 09/01/2008    Mat Stuard L 12/09/2015, 9:01 AM  [facility] [address] [place],  [place], [address] Phone: [phone]   Fax:  [phone]  Name: JOCABED CHEESE MRN: [id number] Date of Birth: 1960-06-11   Geoffry Paradise, PT,DPT 12/09/15 9:02 AM Phone: [phone] Fax: [phone]

## 2015-12-14 ENCOUNTER — Encounter: Payer: Self-pay | Admitting: Physical Therapy

## 2015-12-14 ENCOUNTER — Ambulatory Visit: Payer: Managed Care, Other (non HMO) | Attending: Neurology | Admitting: Physical Therapy

## 2015-12-14 DIAGNOSIS — M6281 Muscle weakness (generalized): Secondary | ICD-10-CM

## 2015-12-14 DIAGNOSIS — R42 Dizziness and giddiness: Secondary | ICD-10-CM

## 2015-12-14 DIAGNOSIS — R2689 Other abnormalities of gait and mobility: Secondary | ICD-10-CM | POA: Insufficient documentation

## 2015-12-14 NOTE — Therapy (Signed)
[facility] [address] [place], [place], [address] Phone: [phone]   Fax:  [phone]  Physical Therapy Treatment  Patient Details  Name: Samantha Church MRN: [id number] Date of Birth: September 24, 1960 Referring Provider: Dr. Felecia Shelling  Encounter Date: 12/14/2015      PT End of Session - 12/14/15 1023    Visit Number 9   Number of Visits 17  updated due to recert   Date for PT Re-Evaluation 12/21/15   Authorization Type Aetna   PT Start Time 0801   PT Stop Time 0841   PT Time Calculation (min) 40 min   Equipment Utilized During Treatment Gait belt  min guard to min A prn   Activity Tolerance Patient tolerated treatment well   Behavior During Therapy [facility] for tasks assessed/performed      Past Medical History:  Diagnosis Date  . Anemia   . Lyme disease   . Multiple sclerosis ([place])   . Neuromuscular disorder ([place])    MS  . Vision abnormalities     Past Surgical History:  Procedure Laterality Date  . BREAST SURGERY    . DILATATION & CURRETTAGE/HYSTEROSCOPY WITH RESECTOCOPE N/A 10/09/2012   Procedure: DILATATION & CURETTAGE/HYSTEROSCOPY WITH RESECTOCOPE;  Surgeon: [name], MD;  Location: [facility];  Service: Gynecology;  Laterality: N/A;  RESECTION with Versapoint   . KNEE ARTHROSCOPY  1996    There were no vitals filed for this visit.      Subjective Assessment - 12/14/15 0804    Subjective Patient reports she believes the stretches she was given are helping.   Pertinent History MS, Lyme Disease, hypothyroidism, anemia   Patient Stated Goals Be able to be stable and continue to walk.    Currently in Pain? Yes   Pain Score 2    Pain Location Knee   Pain Orientation Right   Pain Descriptors / Indicators Aching   Pain Type Acute pain   Pain Radiating Towards Downwards towards tibia   Pain Onset In the past 7 days   Pain Frequency Constant       High Level Balance   Standing balance on airx pad;  narrow BOS; EO w/ head mvts, nods, turns, & diagonals; 3x 30 seconds each; mild sway noted with head mvt; Small rest breaks to allow dizziness to calm down. EC static standing 3x 30 seconds; more significant sway noted than EO w/ head mvts.  Forward gait w/ head turns in hallway; 4 laps; some unsteadiness noted  6 cones placed in a row; pt weaving through cones; VC for technique; pt reported increased dizziness with activity; 4 laps.        PT Short Term Goals - 10/22/15 1525      PT SHORT TERM GOAL #1   Title same as LTGs           PT Long Term Goals - 12/07/15 0851      PT LONG TERM GOAL #1   Title Pt will be IND with HEP in order to improve strength, balance and dizziness. TARGET DATE FOR ALL LTGS: 12/21/15   Status On-going     PT LONG TERM GOAL #2   Title Pt will improve FGA score from 19/30 to >/=27/30 to decr. falls risk.    Status On-going     PT LONG TERM GOAL #3   Title Pt will amb. 1000' over even/uneven terrain, with LRAD at MOD I level, while performing head turns/nods in order to improve functional  mobility and go for walks with spouse.    Status Partially Met     PT LONG TERM GOAL #4   Title Pt will amb. 1000' over even terrain, IND, no AD without c/o L knee buckling in order to amb. at home and work safely.    Status Revised     PT LONG TERM GOAL #5   Title Pt will report dizziness no greater than 1/10 during amb. (500') in order to improve safety during functional mobility.    Status On-going           Plan - 12/14/15 1159    Clinical Impression Statement Todays skilled session focused on high balance activities. She is progressing towards goals and should benefit from continued PT to achieve unmet goals   Rehab Potential Good   Clinical Impairments Affecting Rehab Potential MS, Lyme Disease, hypothyroidism, anemia   PT Frequency 2x / week   PT Duration 4 weeks   PT Treatment/Interventions ADLs/Self Care Home Management;Biofeedback;Canalith  Repostioning;Electrical Stimulation;Neuromuscular re-education;Balance training;Therapeutic exercise;Therapeutic activities;Functional mobility training;Stair training;Gait training;DME Instruction;Orthotic Fit/Training;Patient/family education;Vestibular;Energy conservation;Manual techniques   PT Next Visit Plan Continue Dynamic gait and balance activities to challenge vestibular system, (turns).   PT Home Exercise Plan Balance and Stretching/strengthening HEP   Consulted and Agree with Plan of Care Patient      Patient will benefit from skilled therapeutic intervention in order to improve the following deficits and impairments:  Abnormal gait, Decreased endurance, Decreased knowledge of use of DME, Decreased balance, Decreased mobility, Dizziness, Postural dysfunction, Decreased strength, Impaired sensation  Visit Diagnosis: Other abnormalities of gait and mobility  Dizziness and giddiness  Muscle weakness (generalized)     Problem List Patient Active Problem List   Diagnosis Date Noted  . Urinary frequency 10/14/2015  . Left leg weakness 10/14/2015  . Lyme disease 11/03/2014  . Hypothyroidism 11/03/2014  . Dysesthesia 11/03/2014  . Other fatigue 11/03/2014  . Gait disturbance 11/03/2014  . MULTIPLE SCLEROSIS 09/01/2008  . VULVOVAGINITIS 09/01/2008    Benjiman Core, SPTA 12/14/2015, 12:07 PM  [facility] [address] [place], [place], [address] Phone: [phone]   Fax:  [phone]  Name: Samantha Church MRN: [id number] Date of Birth: Jan 07, 1961

## 2015-12-17 ENCOUNTER — Ambulatory Visit: Payer: Managed Care, Other (non HMO)

## 2015-12-17 DIAGNOSIS — M6281 Muscle weakness (generalized): Secondary | ICD-10-CM

## 2015-12-17 DIAGNOSIS — R2689 Other abnormalities of gait and mobility: Secondary | ICD-10-CM | POA: Diagnosis not present

## 2015-12-17 DIAGNOSIS — R42 Dizziness and giddiness: Secondary | ICD-10-CM

## 2015-12-17 NOTE — Patient Instructions (Signed)
Perform at counter with hand on counter for support as needed.  Up / Down Head Motion    Walking on solid surface, move head and eyes toward ceiling for __2__ steps. Then, move head and eyes toward floor for __2__ steps. Repeat __4__ times per session. Do __1__ sessions per day.  Copyright  VHI. All rights reserved.  Side to Side Head Motion    Walking on solid surface, turn head and eyes to left for __2__ steps. Then, turn head and eyes to opposite side for __2__ steps. Repeat sequence __4__ times per session. Do __1__ sessions per day.  Copyright  VHI. All rights reserved.   HOLD COUNTER FOR SUPPORT AS MUCH AS NEEDED  "I love a Parade" Lift    Using a chair if necessary, march in place lifting knee as high as you can.  Repeat 10 times on each leg alternating legs. Make sure you tuck in stomach muscles to use core and don't bend trunk. http://gt2.exer.us/345   Copyright  VHI. All rights reserved.   ABDUCTION: Standing (Active)    Take half a step back with kicking leg. Stand, feet flat. Lift right leg out to side. Complete 10 times on each leg.   Perform 2 sets of 10 reps on each leg. Perform 3 days per week. Progress by performing 3 sets of 10 reps. Then add band, tied around ankles (start with yellow theraband or the least resistance), or ankle weight.  http://gtsc.exer.us/111   Copyright  VHI. All rights reserved.  HIP / KNEE: Extension - Standing    Squeeze glutes. Raise and lift leg backward. Keep knee straight or slightly bent. Repeat 10 times on each leg. Perform 2 sets of 10 reps.  Hold onto a support. Perform 3 days a week.  Progression: 3 sets of 10 reps. When this is easy, add band or ankle weight.   Copyright  VHI. All rights reserved.   SINGLE LIMB STANCE    Stance: single leg on floor. Raise leg. Hold 10 seconds. Repeat with other leg. Do 2 times on each leg. Progress to not holding support as tolerated. Perform every day.  Copyright   VHI. All rights reserved.   Tandem Stance    Right foot in front of left, heel touching toe both feet "straight ahead". Stand on Foot Triangle of Support with both feet. Balance in this position 10 seconds. Do with left foot in front of right.  Do 2 times each direction. Perform daily.  Copyright  VHI. All rights reserved.   Hamstring Curl: Standing (Single Leg)    No tubing.  In shoulder width stance bend knee, foot toward buttock.  Alternate legs doing 10 times on each leg.    Perform 3 days per week. Progress by performing 2 sets or 10 reps, then 3 sets of 10 reps. Then add band as tolerated.  http://tub.exer.us/198   Copyright  VHI. All rights reserved.   FUNCTIONAL MOBILITY: Wall Squat    Stance: shoulder-width on floor, against wall. Place feet in front of hips. Bend hips and knees to about 45 degrees. Keep back straight. Do not allow knees to bend past toes. Hold for 5 seconds. Squeeze glutes and quads to stand. _5__ reps per set, _2__ sets per day, _2-3__ days per week  Copyright  VHI. All rights reserved.   Gaze Stabilization: Standing Feet Together    Feet together, keeping eyes on target on wall __3-5__ feet away, tilt head down 15-30 and move head side to side  for __20__ seconds. Repeat while moving head up and down for __20__ seconds. Do __3__ sessions per day.  Copyright  VHI. All rights reserved.   Turning in Place: Compensatory Strategy    Standing in place, first move eyes to target at eye level located to right side. Keeping eyes on target, turn head then body toward target. Repeat sequence with target on each wall to complete half turn. Repeat __5__ times per session. Do __1__ sessions per day.  Copyright  VHI. All rights reserved.   Piriformis Stretch, Supine    Lie supine, right ankle crossed onto opposite knee, then gently push top knee away from body.  Hold _30__ seconds.  For deeper stretch holding bottom leg behind knee, gently  pull legs toward chest until stretch is felt in buttock of top leg Repeat __3_ times per session. Do _2-3__ sessions per day.  Copyright  VHI. All rights reserved.   If stretch above is too challenging, please perform the stretch below.  Piriformis Stretch    Lying on back, pull right knee toward opposite shoulder. Hold __30__ seconds. Repeat __3__ times. Do __2-3__ sessions per day.  http://gt2.exer.us/258   Bridging    Slowly raise buttocks from floor, keeping stomach tight. Repeat 10 times per set. Do 1 sessions per day.  http://orth.exer.us/1097   Copyright  VHI. All rights reserved.   Leg Extension (Hamstring)    Sit toward front edge of chair, with leg out straight, heel on floor, toes pointing toward body. Keeping back straight, bend forward at hip, breathing out through pursed lips. Return, breathing in.  Hold the position for at least 30 seconds.  Do 3 times on each leg twice a day.  Copyright  VHI. All rights reserved.

## 2015-12-17 NOTE — Therapy (Signed)
[facility] [address] [place], [place], [address] Phone: [phone]   Fax:  [phone]  Physical Therapy Treatment  Patient Details  Name: Samantha Church MRN: [id number] Date of Birth: 11-Mar-1960 Referring Provider: Dr. Felecia Shelling  Encounter Date: 12/17/2015      PT End of Session - 12/17/15 0848    Visit Number 10   Number of Visits 17   Date for PT Re-Evaluation 12/21/15   Authorization Type Aetna   PT Start Time 0802   PT Stop Time 0844   PT Time Calculation (min) 42 min   Equipment Utilized During Treatment --  S prn   Activity Tolerance Patient tolerated treatment well   Behavior During Therapy [facility] for tasks assessed/performed      Past Medical History:  Diagnosis Date  . Anemia   . Lyme disease   . Multiple sclerosis ([place])   . Neuromuscular disorder ([place])    MS  . Vision abnormalities     Past Surgical History:  Procedure Laterality Date  . BREAST SURGERY    . DILATATION & CURRETTAGE/HYSTEROSCOPY WITH RESECTOCOPE N/A 10/09/2012   Procedure: DILATATION & CURETTAGE/HYSTEROSCOPY WITH RESECTOCOPE;  Surgeon: [name], MD;  Location: [facility];  Service: Gynecology;  Laterality: N/A;  RESECTION with Versapoint   . KNEE ARTHROSCOPY  1996    There were no vitals filed for this visit.      Subjective Assessment - 12/17/15 0806    Subjective Pt reported dizziness is about the same. Pt denied falls since last visit. Pt reports R hip is feeling better.    Pertinent History MS, Lyme Disease, hypothyroidism, anemia   Patient Stated Goals Be able to be stable and continue to walk.    Currently in Pain? No/denies          Therex and Neuro re-ed: Pt performed HEP and progressed strengthening HEP at counter with S for safety and intermittent hand support on counter prn. Cues for technique during gaze stab. And turns. Please see pt instructions for details. Pt did not perform piriformis or hamstring  stretch, or bridges; PT reviewed those with pt and she verbalized understanding.                        PT Education - 12/17/15 0847    Education provided Yes   Education Details PT reviewed and modified HEP as tolerated. PT also discussed next session will likely be d/c as POC is through 12/21/15 and pt has been progressing. PT reiterated the importance of performing HEP exercises to improve vestibular input (turns and gaze stab.).   Person(s) Educated Patient   Methods Explanation;Demonstration;Verbal cues;Handout   Comprehension Returned demonstration;Verbalized understanding          PT Short Term Goals - 10/22/15 1525      PT SHORT TERM GOAL #1   Title same as LTGs           PT Long Term Goals - 12/17/15 0850      PT LONG TERM GOAL #1   Title Pt will be IND with HEP in order to improve strength, balance and dizziness. TARGET DATE FOR ALL LTGS: 12/21/15   Status Partially Met     PT LONG TERM GOAL #2   Title Pt will improve FGA score from 19/30 to >/=27/30 to decr. falls risk.    Status On-going     PT LONG TERM GOAL #3   Title Pt will amb.  1000' over even/uneven terrain, with LRAD at MOD I level, while performing head turns/nods in order to improve functional mobility and go for walks with spouse.    Status Partially Met     PT LONG TERM GOAL #4   Title Pt will amb. 1000' over even terrain, IND, no AD without c/o Church knee buckling in order to amb. at home and work safely.    Status Revised     PT LONG TERM GOAL #5   Title Pt will report dizziness no greater than 1/10 during amb. (500') in order to improve safety during functional mobility.    Status On-going               Plan - 12/17/15 0848    Clinical Impression Statement Pt demonstrated improved strength, as she was able to tolerate incr. sets of reps during LE strengthening HEP. Pt continues to require cues to perform exercises to improve vestibular input (turns and gaze  stabilization). Pt will likely be discharged next session, due to progress. Continue with POC.    Rehab Potential Good   Clinical Impairments Affecting Rehab Potential MS, Lyme Disease, hypothyroidism, anemia   PT Frequency 2x / week   PT Duration 4 weeks   PT Treatment/Interventions ADLs/Self Care Home Management;Biofeedback;Canalith Repostioning;Electrical Stimulation;Neuromuscular re-education;Balance training;Therapeutic exercise;Therapeutic activities;Functional mobility training;Stair training;Gait training;DME Instruction;Orthotic Fit/Training;Patient/family education;Vestibular;Energy conservation;Manual techniques   PT Next Visit Plan Check goals and d/c.    PT Home Exercise Plan Balance and Stretching/strengthening HEP   Consulted and Agree with Plan of Care Patient      Patient will benefit from skilled therapeutic intervention in order to improve the following deficits and impairments:  Abnormal gait, Decreased endurance, Decreased knowledge of use of DME, Decreased balance, Decreased mobility, Dizziness, Postural dysfunction, Decreased strength, Impaired sensation  Visit Diagnosis: Other abnormalities of gait and mobility  Dizziness and giddiness  Muscle weakness (generalized)     Problem List Patient Active Problem List   Diagnosis Date Noted  . Urinary frequency 10/14/2015  . Left leg weakness 10/14/2015  . Lyme disease 11/03/2014  . Hypothyroidism 11/03/2014  . Dysesthesia 11/03/2014  . Other fatigue 11/03/2014  . Gait disturbance 11/03/2014  . MULTIPLE SCLEROSIS 09/01/2008  . VULVOVAGINITIS 09/01/2008    Samantha Church 12/17/2015, 8:52 AM  [facility] [address] [place], [place], [address] Phone: [phone]   Fax:  [phone]  Name: Samantha Church MRN: [id number] Date of Birth: Sep 27, 1960   Geoffry Paradise, PT,DPT 12/17/15 8:55 AM Phone: [phone] Fax: [phone]

## 2015-12-21 ENCOUNTER — Ambulatory Visit: Payer: Managed Care, Other (non HMO) | Admitting: Physical Therapy

## 2015-12-21 ENCOUNTER — Encounter: Payer: Self-pay | Admitting: Physical Therapy

## 2015-12-21 DIAGNOSIS — R2689 Other abnormalities of gait and mobility: Secondary | ICD-10-CM

## 2015-12-21 DIAGNOSIS — M6281 Muscle weakness (generalized): Secondary | ICD-10-CM

## 2015-12-21 DIAGNOSIS — R42 Dizziness and giddiness: Secondary | ICD-10-CM

## 2015-12-21 NOTE — Therapy (Addendum)
[facility] [address] [place], [place], [address] Phone: [phone]   Fax:  [phone]  Physical Therapy Treatment  Patient Details  Name: Samantha Church MRN: [id number] Date of Birth: 1960/09/05 Referring Provider: Dr. Felecia Shelling  Encounter Date: 12/21/2015      PT End of Session - 12/21/15 0807    Visit Number 11   Number of Visits 17   Date for PT Re-Evaluation 12/21/15   Authorization Type [employer]   PT Start Time 0803   PT Stop Time 0827  discharge visit- all time not needed   PT Time Calculation (min) 24 min   Equipment Utilized During Treatment --  S prn   Activity Tolerance Patient tolerated treatment well   Behavior During Therapy [facility] for tasks assessed/performed      Past Medical History:  Diagnosis Date  . Anemia   . Lyme disease   . Multiple sclerosis ([place])   . Neuromuscular disorder ([place])    MS  . Vision abnormalities     Past Surgical History:  Procedure Laterality Date  . BREAST SURGERY    . DILATATION & CURRETTAGE/HYSTEROSCOPY WITH RESECTOCOPE N/A 10/09/2012   Procedure: DILATATION & CURETTAGE/HYSTEROSCOPY WITH RESECTOCOPE;  Surgeon: [name], MD;  Location: [facility];  Service: Gynecology;  Laterality: N/A;  RESECTION with Versapoint   . KNEE ARTHROSCOPY  1996    There were no vitals filed for this visit.      Subjective Assessment - 12/21/15 0805    Subjective No new complaints. No falls. Right hip pain is getting better, can sleep on her right side now.   Pertinent History MS, Lyme Disease, hypothyroidism, anemia   Patient Stated Goals Be able to be stable and continue to walk.    Currently in Pain? Yes   Pain Score 2    Pain Location Knee   Pain Orientation Right   Pain Descriptors / Indicators Aching;Sore   Pain Type Acute pain   Pain Onset 1 to 4 weeks ago   Pain Frequency Intermittent   Aggravating Factors  lying on it, immobility such as sitting for too long   Pain  Relieving Factors moving, stretching            [facility] PT Assessment - 12/21/15 0808      Functional Gait  Assessment   Gait assessed  Yes   Gait Level Surface Walks 20 ft in less than 5.5 sec, no assistive devices, good speed, no evidence for imbalance, normal gait pattern, deviates no more than 6 in outside of the 12 in walkway width.   Change in Gait Speed Able to smoothly change walking speed without loss of balance or gait deviation. Deviate no more than 6 in outside of the 12 in walkway width.   Gait with Horizontal Head Turns Performs head turns smoothly with no change in gait. Deviates no more than 6 in outside 12 in walkway width   Gait with Vertical Head Turns Performs task with slight change in gait velocity (eg, minor disruption to smooth gait path), deviates 6 - 10 in outside 12 in walkway width or uses assistive device   Gait and Pivot Turn Pivot turns safely within 3 sec and stops quickly with no loss of balance.   Step Over Obstacle Is able to step over 2 stacked shoe boxes taped together (9 in total height) without changing gait speed. No evidence of imbalance.   Gait with Narrow Base of Support Ambulates 7-9 steps.  Gait with Eyes Closed Walks 20 ft, no assistive devices, good speed, no evidence of imbalance, normal gait pattern, deviates no more than 6 in outside 12 in walkway width. Ambulates 20 ft in less than 7 sec.   Ambulating Backwards Walks 20 ft, uses assistive device, slower speed, mild gait deviations, deviates 6-10 in outside 12 in walkway width.   Steps Alternating feet, no rail.   Total Score 27   FGA comment: 25-27= low fall risk           [facility] Adult PT Treatment/Exercise - 12/21/15 0808      Transfers   Transfers Sit to Stand;Stand to Sit     Ambulation/Gait   Ambulation/Gait Yes   Ambulation/Gait Assistance 6: Modified independent (Device/Increase time)   Ambulation/Gait Assistance Details no buckling of knee noted. No balance loss with head  turns/enviromental scanning noted   Ambulation Distance (Feet) 1000 Feet   Assistive device None   Gait Pattern Step-through pattern;Decreased dorsiflexion - left;Decreased stance time - left;Decreased stride length   Ambulation Surface Level;Indoor            PT Short Term Goals - 10/22/15 1525      PT SHORT TERM GOAL #1   Title same as LTGs           PT Long Term Goals - 12/21/15 2060      PT LONG TERM GOAL #1   Title Pt will be IND with HEP in order to improve strength, balance and dizziness. TARGET DATE FOR ALL LTGS: 12/21/15   Baseline 12/21/15: met today   Status Partially Met     PT LONG TERM GOAL #2   Title Pt will improve FGA score from 19/30 to >/=27/30 to decr. falls risk.    Baseline 12/21/15: scored 27/30 today   Status Achieved     PT LONG TERM GOAL #3   Title Pt will amb. 1000' over even/uneven terrain, with LRAD at MOD I level, while performing head turns/nods in order to improve functional mobility and go for walks with spouse.    Baseline 12/21/15: met today   Status Achieved     PT LONG TERM GOAL #4   Title Pt will amb. 1000' over even terrain, IND, no AD without c/o L knee buckling in order to amb. at home and work safely.    Baseline 12/21/15: met today   Status Achieved     PT LONG TERM GOAL #5   Title Pt will report dizziness no greater than 1/10 during amb. (500') in order to improve safety during functional mobility.    Baseline 12/21/15: met today   Status Achieved            Plan - 12/21/15 0807    Clinical Impression Statement Pt has met all goals. Agreeable to discharge today without any questions.    Rehab Potential Good   Clinical Impairments Affecting Rehab Potential MS, Lyme Disease, hypothyroidism, anemia   PT Frequency 2x / week   PT Duration 4 weeks   PT Treatment/Interventions ADLs/Self Care Home Management;Biofeedback;Canalith Repostioning;Electrical Stimulation;Neuromuscular re-education;Balance training;Therapeutic  exercise;Therapeutic activities;Functional mobility training;Stair training;Gait training;DME Instruction;Orthotic Fit/Training;Patient/family education;Vestibular;Energy conservation;Manual techniques   PT Next Visit Plan discharge per PT POC   PT Home Exercise Plan Balance and Stretching/strengthening HEP   Consulted and Agree with Plan of Care Patient      Patient will benefit from skilled therapeutic intervention in order to improve the following deficits and impairments:  Abnormal gait, Decreased endurance, Decreased knowledge  of use of DME, Decreased balance, Decreased mobility, Dizziness, Postural dysfunction, Decreased strength, Impaired sensation  Visit Diagnosis: Other abnormalities of gait and mobility  Dizziness and giddiness  Muscle weakness (generalized)     Problem List Patient Active Problem List   Diagnosis Date Noted  . Urinary frequency 10/14/2015  . Left leg weakness 10/14/2015  . Lyme disease 11/03/2014  . Hypothyroidism 11/03/2014  . Dysesthesia 11/03/2014  . Other fatigue 11/03/2014  . Gait disturbance 11/03/2014  . MULTIPLE SCLEROSIS 09/01/2008  . VULVOVAGINITIS 09/01/2008    Willow Ora, PTA, [facility] [address] [place], [place] [address] [phone] 12/21/15, 8:39 AM   Name: Samantha Church MRN: [id number] Date of Birth: Nov 15, 1960  PHYSICAL THERAPY DISCHARGE SUMMARY  Visits from Start of Care: 11  Current functional level related to goals / functional outcomes:     PT Long Term Goals - 12/21/15 6429      PT LONG TERM GOAL #1   Title Pt will be IND with HEP in order to improve strength, balance and dizziness. TARGET DATE FOR ALL LTGS: 12/21/15   Baseline 12/21/15: met today   Status Partially Met     PT LONG TERM GOAL #2   Title Pt will improve FGA score from 19/30 to >/=27/30 to decr. falls risk.    Baseline 12/21/15: scored 27/30 today   Status Achieved     PT LONG TERM GOAL #3   Title Pt will  amb. 1000' over even/uneven terrain, with LRAD at MOD I level, while performing head turns/nods in order to improve functional mobility and go for walks with spouse.    Baseline 12/21/15: met today   Status Achieved     PT LONG TERM GOAL #4   Title Pt will amb. 1000' over even terrain, IND, no AD without c/o L knee buckling in order to amb. at home and work safely.    Baseline 12/21/15: met today   Status Achieved     PT LONG TERM GOAL #5   Title Pt will report dizziness no greater than 1/10 during amb. (500') in order to improve safety during functional mobility.    Baseline 12/21/15: met today   Status Achieved        Remaining deficits: Intermittent LE weakness.   Education / Equipment: HEP  Plan: Patient agrees to discharge.  Patient goals were met. Patient is being discharged due to meeting the stated rehab goals.  ?????     Discharge performed by PT.   Geoffry Paradise, PT,DPT 12/21/15 11:00 AM Phone: [phone] Fax: [phone]

## 2015-12-23 ENCOUNTER — Ambulatory Visit: Payer: Managed Care, Other (non HMO)

## 2015-12-24 ENCOUNTER — Ambulatory Visit
Admission: RE | Admit: 2015-12-24 | Discharge: 2015-12-24 | Disposition: A | Discharge status: Home / Self Care | Payer: Managed Care, Other (non HMO) | Source: Ambulatory Visit | Attending: Neurology | Admitting: Neurology

## 2015-12-24 DIAGNOSIS — R208 Other disturbances of skin sensation: Secondary | ICD-10-CM

## 2015-12-24 DIAGNOSIS — G35 Multiple sclerosis: Secondary | ICD-10-CM

## 2015-12-24 DIAGNOSIS — R269 Unspecified abnormalities of gait and mobility: Secondary | ICD-10-CM

## 2015-12-24 MED ORDER — GADOBENATE DIMEGLUMINE 529 MG/ML IV SOLN
13.0000 mL | Freq: Once | INTRAVENOUS | Status: AC | PRN
  Administered 2015-12-24: 13 mL via INTRAVENOUS

## 2015-12-27 ENCOUNTER — Telehealth: Payer: Self-pay | Admitting: *Deleted

## 2015-12-27 NOTE — Telephone Encounter (Signed)
I have spoken with Samantha Church this afternoon, and per RAS, advised no new changes on MRI.  She verbalized understanding of same/fim

## 2015-12-27 NOTE — Telephone Encounter (Signed)
-----   Message from Britt Bottom, MD sent at 12/24/2015  4:04 PM EST ----- Please let her know that the MRI of the brain is unchanged.

## 2015-12-28 ENCOUNTER — Ambulatory Visit: Payer: Managed Care, Other (non HMO) | Admitting: Physical Therapy

## 2015-12-30 ENCOUNTER — Ambulatory Visit: Payer: Managed Care, Other (non HMO)

## 2016-01-17 ENCOUNTER — Ambulatory Visit (INDEPENDENT_AMBULATORY_CARE_PROVIDER_SITE_OTHER): Payer: Managed Care, Other (non HMO) | Admitting: Podiatry

## 2016-01-17 ENCOUNTER — Encounter: Payer: Self-pay | Admitting: Podiatry

## 2016-01-17 VITALS — BP 126/77 | HR 62

## 2016-01-17 DIAGNOSIS — M79676 Pain in unspecified toe(s): Secondary | ICD-10-CM | POA: Diagnosis not present

## 2016-01-17 DIAGNOSIS — L03039 Cellulitis of unspecified toe: Secondary | ICD-10-CM | POA: Diagnosis not present

## 2016-01-17 DIAGNOSIS — L600 Ingrowing nail: Secondary | ICD-10-CM | POA: Diagnosis not present

## 2016-01-17 NOTE — Progress Notes (Signed)
Subjective: Patient presents today for evaluation of pain in toe(s). Patient is concerned for possible ingrown nail. Patient states that the pain has been present for a few weeks now. Patient presents today for further treatment and evaluation.  Objective:  General: Well developed, nourished, in no acute distress, alert and oriented x3   Dermatology: Skin is warm, dry and supple bilateral. Right great toe medial border appears to be erythematous with evidence of an ingrowing nail.  Pain on palpation noted to the border of the nail fold. The remaining nails appear unremarkable at this time. There are no open sores, lesions.  Vascular: Dorsalis Pedis artery and Posterior Tibial artery pedal pulses palpable. No lower extremity edema noted.   Neruologic: Grossly intact via light touch bilateral.  Musculoskeletal: Muscular strength within normal limits in all groups bilateral. Normal range of motion noted to all pedal and ankle joints.   Assesement: #1 Paronychia with ingrowing nail right great toe medial border #2 Pain in toe #3 Incurvated nail  Plan of Care:  1. Patient evaluated.  2. Discussed treatment alternatives and plan of care. Explained nail avulsion procedure and post procedure course to patient. 3. Patient opted for permanent partial nail avulsion.  4. Prior to procedure, local anesthesia infiltration utilized using 3 ml of a 50:50 mixture of 2% plain lidocaine and 0.5% plain marcaine in a normal hallux block fashion and a betadine prep performed.  5. Partial permanent nail avulsion with chemical matrixectomy performed using [id number] applications of phenol followed by alcohol flush.  6. Light dressing applied. 7. Return to clinic in 2 weeks.   Edrick Kins, DPM [facility]  Dr. Edrick Kins, [address]                                        [place], [place] [address]                Office [phone]  Fax [phone]

## 2016-01-31 ENCOUNTER — Ambulatory Visit (INDEPENDENT_AMBULATORY_CARE_PROVIDER_SITE_OTHER): Payer: Managed Care, Other (non HMO) | Admitting: Podiatry

## 2016-01-31 DIAGNOSIS — S91209D Unspecified open wound of unspecified toe(s) with damage to nail, subsequent encounter: Secondary | ICD-10-CM

## 2016-01-31 DIAGNOSIS — S91109D Unspecified open wound of unspecified toe(s) without damage to nail, subsequent encounter: Secondary | ICD-10-CM

## 2016-01-31 DIAGNOSIS — M79676 Pain in unspecified toe(s): Secondary | ICD-10-CM

## 2016-01-31 NOTE — Progress Notes (Signed)
   Subjective: Patient presents today 2 weeks post ingrown nail permanent nail avulsion procedure. Patient states that the toe and nail fold is feeling much better.  Objective: Skin is warm, dry and supple. Nail and respective nail fold appears to be healing appropriately. Open wound to the associated nail fold with a granular wound base and moderate amount of fibrotic tissue. Minimal drainage noted. Mild erythema around the periungual region likely due to phenol chemical matricectomy.  Assessment: #1 postop permanent partial nail avulsion right great toe medial border #2 open wound periungual nail fold of respective digit.   Plan of care: #1 patient was evaluated  #2 debridement of open wound was performed to the periungual border of the respective toe using a currette. Antibiotic ointment and Band-Aid was applied. #3 patient is to return to clinic on a PRN  basis.   Edrick Kins, DPM [facility]  Dr. Edrick Kins, [place]                                        [place], [place] [address]                Office [phone]  Fax [phone]

## 2016-04-04 ENCOUNTER — Telehealth: Payer: Self-pay | Admitting: *Deleted

## 2016-04-04 NOTE — Telephone Encounter (Signed)
Tecfidera PA approved by Holland Falling [phone]) through 04/04/17 - pt [id number] - effective through 04/04/17 - TL#[phone].

## 2016-04-13 ENCOUNTER — Ambulatory Visit (INDEPENDENT_AMBULATORY_CARE_PROVIDER_SITE_OTHER): Payer: Managed Care, Other (non HMO) | Admitting: Neurology

## 2016-04-13 ENCOUNTER — Encounter: Payer: Self-pay | Admitting: Neurology

## 2016-04-13 VITALS — BP 112/71 | HR 63 | Resp 14 | Wt 143.5 lb

## 2016-04-13 DIAGNOSIS — G35 Multiple sclerosis: Secondary | ICD-10-CM | POA: Diagnosis not present

## 2016-04-13 DIAGNOSIS — R5383 Other fatigue: Secondary | ICD-10-CM

## 2016-04-13 DIAGNOSIS — R269 Unspecified abnormalities of gait and mobility: Secondary | ICD-10-CM | POA: Diagnosis not present

## 2016-04-13 DIAGNOSIS — R350 Frequency of micturition: Secondary | ICD-10-CM

## 2016-04-13 DIAGNOSIS — R208 Other disturbances of skin sensation: Secondary | ICD-10-CM

## 2016-04-13 DIAGNOSIS — R29898 Other symptoms and signs involving the musculoskeletal system: Secondary | ICD-10-CM

## 2016-04-13 MED ORDER — OXYBUTYNIN CHLORIDE ER 10 MG PO TB24: 10.0000 mg | ORAL_TABLET | Freq: Every day | ORAL | 5 refills | Status: DC

## 2016-04-13 NOTE — Progress Notes (Signed)
[facility]  PATIENT: Samantha Church DOB: 1960/05/05  REFERRING DOCTOR OR PCP:  Donald Prose SOURCE: Patient and records from [facility] neurology.  _________________________________   HISTORICAL  CHIEF COMPLAINT:  Chief Complaint  Patient presents with  . Multiple Sclerosis    Sts. she continues to tolerate Tecfidera well.  C/O more difficulty with left foot drop.  Last MRI's done December 2017/fim    HISTORY OF PRESENT ILLNESS:  Samantha Church is a 56 year old woman with multiple sclerosis reporting more numbness and gait issues.    .   MS:  She is on Tecfidera and tolerates it well.   She switched to it from Cole in 2014 (was on Tysabri but converted to JCV Ab positive).      MRI of the brain performed October 2015 showed 1 focus not present on the December 2014 MRI.   2017 MRI of the brain was stable compared to 2016.   MRI of the cervical spine 2017 (after onset of numbness below neck) showed 4 foci (one not present in 2008) and MRi T-spine showed two foci, neither present in 2008.     Gait/strength:    Gait is worse and she notes more left sided clumsiness and weakness.   Balance is off.   She does not use a cane.    Legs has some spasticity which seems stable.    She feels she needs to look down to walk.     Gait is wider and slower.    Arms are fine as far as strength.   Balance is much worse with eyes closed.   She has trouble going downstairs and can't lead with the left leg.  She has trouble going upstairs if she has her hands full.  Numbness:   She is noting more numbness in both hands.  Numbness is below her neck bilaterally.       MS History:   She was diagnosed in 2007 after presenting with right sided numbness and mild clumsiness / allodynia.    MRI was consistent with MS and she was started with Betaseron.  She then switched to Copaxone but had exacerbations and switched to Tysabri.     She converted to Tysabri but became JCV Ab positive and  switched to Gilenya.   Due to exacerbations on Gilenya, she switched back to Tysabri x one year and then switched to Tecfidera (in 2014).     Bladder/Bowel:   She notes more urinary frequency and urgency.  No incontinence recently.  Fatigue/sleep:   She still has physical and cognitive fatigue.   She is sleepy around 2 pm and naps every afternoon x 1 hour.    She feels very refreshed after a one hour nap and does better the rest of the day.   She has Provigil but never took it because the nap helps.   She falls asleep easily at night and sleeps 8 hours.   (9-5 am) .  Mood/Cogntion:   She denies any depression or anxiety.   She denies major cognitive problems but she notes reduced attention and focus at times.  She has no verbal fluencies issues.      REVIEW OF SYSTEMS: Constitutional: No fevers, chills, sweats, or change in appetite.   She has fatigue Eyes: No visual changes, double vision, eye pain Ear, nose and throat: No hearing loss, ear pain, nasal congestion, sore throat Cardiovascular: No chest pain, palpitations Respiratory: No shortness of breath at rest or  with exertion.   No wheezes GastrointestinaI: No nausea, vomiting, diarrhea, abdominal pain, fecal incontinence Genitourinary: No dysuria, urinary retention or frequency.  No nocturia. Musculoskeletal: No neck pain, back pain Integumentary: No rash, pruritus, skin lesions Neurological: as above Psychiatric: No depression at this time.  No anxiety Endocrine: She has hypothyroidism.  No palpitations, diaphoresis, change in appetite, change in weigh or increased thirst Hematologic/Lymphatic: No anemia, purpura, petechiae. Allergic/Immunologic: No itchy/runny eyes, nasal congestion, recent allergic reactions, rashes  ALLERGIES: No Known Allergies  HOME MEDICATIONS:  Current Outpatient Prescriptions:  .  Ascorbic Acid 500 MG/15ML SYRP, Take by mouth., Disp: , Rfl:  .  cholecalciferol (VITAMIN D) 1000 units tablet, Take  5,000 Units by mouth daily., Disp: , Rfl:  .  Dimethyl Fumarate (TECFIDERA) 240 MG CPDR, Take 1 capsule (240 mg total) by mouth 2 (two) times daily., Disp: 180 capsule, Rfl: 3 .  magnesium 30 MG tablet, Take 30 mg by mouth 2 (two) times daily., Disp: , Rfl:  .  NP THYROID 60 MG tablet, , Disp: , Rfl:  .  Omega-3 Fatty Acids (FISH OIL PO), Take 2 tablets by mouth 2 (two) times daily., Disp: , Rfl:  .  OVER THE COUNTER MEDICATION, 40 mg., Disp: , Rfl:  .  OVER THE COUNTER MEDICATION, 800 mg., Disp: , Rfl:  .  OVER THE COUNTER MEDICATION, 85 mg., Disp: , Rfl:  .  OVER THE COUNTER MEDICATION, 900 mg., Disp: , Rfl:  .  OVER THE COUNTER MEDICATION, 900 mg., Disp: , Rfl:  .  OVER THE COUNTER MEDICATION, 400 mg., Disp: , Rfl:  .  OVER THE COUNTER MEDICATION, 15 mg., Disp: , Rfl:  .  OVER THE COUNTER MEDICATION, 10 mg., Disp: , Rfl:  .  OVER THE COUNTER MEDICATION, 50 mg., Disp: , Rfl:  .  OVER THE COUNTER MEDICATION, 5 mg., Disp: , Rfl:  .  OVER THE COUNTER MEDICATION, 2 mg., Disp: , Rfl:  .  OVER THE COUNTER MEDICATION, 30 Units., Disp: , Rfl:  .  OVER THE COUNTER MEDICATION, 25 mg., Disp: , Rfl:  .  OVER THE COUNTER MEDICATION, 25 mg., Disp: , Rfl:  .  OVER THE COUNTER MEDICATION, 400 mg., Disp: , Rfl:  .  OVER THE COUNTER MEDICATION, 30 mg., Disp: , Rfl:  .  OVER THE COUNTER MEDICATION, 30 mg., Disp: , Rfl:  .  OVER THE COUNTER MEDICATION, 250 mg., Disp: , Rfl:  .  OVER THE COUNTER MEDICATION, 250 mg., Disp: , Rfl:  .  OVER THE COUNTER MEDICATION, 350 mg., Disp: , Rfl:  .  OVER THE COUNTER MEDICATION, 250 mg., Disp: , Rfl:  .  OVER THE COUNTER MEDICATION, 200 mg., Disp: , Rfl:  .  OVER THE COUNTER MEDICATION, 110 mcg., Disp: , Rfl:  .  OVER THE COUNTER MEDICATION, 70 mcg., Disp: , Rfl:  .  OVER THE COUNTER MEDICATION, 60 mcg., Disp: , Rfl:  .  OVER THE COUNTER MEDICATION, 750 Units., Disp: , Rfl:  .  OVER THE COUNTER MEDICATION, 90 Units., Disp: , Rfl:  .  OVER THE COUNTER MEDICATION, 25  Units., Disp: , Rfl:  .  SYNTHROID 75 MCG tablet, TK 1 T PO D OES BEFORE BREAKFAST, Disp: , Rfl: 2 .  modafinil (PROVIGIL) 200 MG tablet, Take 1 tablet (200 mg total) by mouth daily. (Patient not taking: Reported on 04/13/2016), Disp: 30 tablet, Rfl: 5 .  oxybutynin (DITROPAN XL) 10 MG 24 hr tablet, Take 1 tablet (10  mg total) by mouth at bedtime., Disp: 30 tablet, Rfl: 5 No current facility-administered medications for this visit.   Facility-Administered Medications Ordered in Other Visits:  .  gadopentetate dimeglumine (MAGNEVIST) injection 13 mL, 13 mL, Intravenous, Once PRN, Britt Bottom, MD .  gadopentetate dimeglumine (MAGNEVIST) injection 13 mL, 13 mL, Intravenous, Once PRN, Britt Bottom, MD .  gadopentetate dimeglumine (MAGNEVIST) injection 13 mL, 13 mL, Intravenous, Once PRN, Britt Bottom, MD  PAST MEDICAL HISTORY: Past Medical History:  Diagnosis Date  . Anemia   . Lyme disease   . Multiple sclerosis ([place])   . Neuromuscular disorder ([place])    MS  . Vision abnormalities     PAST SURGICAL HISTORY: Past Surgical History:  Procedure Laterality Date  . BREAST SURGERY    . DILATATION & CURRETTAGE/HYSTEROSCOPY WITH RESECTOCOPE N/A 10/09/2012   Procedure: DILATATION & CURETTAGE/HYSTEROSCOPY WITH RESECTOCOPE;  Surgeon: [name], MD;  Location: [facility];  Service: Gynecology;  Laterality: N/A;  RESECTION with Versapoint   . KNEE ARTHROSCOPY  1996    FAMILY HISTORY: Family History  Problem Relation Age of Onset  . Hypertension Mother   . Colon cancer Father     SOCIAL HISTORY:  Social History   Social History  . Marital status: Married    Spouse name: N/A  . Number of children: N/A  . Years of education: N/A   Occupational History  . Not on file.   Social History Main Topics  . Smoking status: Never Smoker  . Smokeless tobacco: Never Used  . Alcohol use Yes     Comment: 2x month  . Drug use: No  . Sexual activity: Not on file   Other Topics Concern   . Not on file   Social History Narrative  . No narrative on file     PHYSICAL EXAM  Vitals:   04/13/16 1615  BP: 112/71  Pulse: 63  Resp: 14  Weight: 143 lb 8 oz (65.1 kg)    Body mass index is 23.16 kg/m.   General: The patient is well-developed and well-nourished and in no acute distress  Musculoskeletal:  Back is nontender  Neurologic Exam  Mental status: The patient is alert and oriented x 3 at the time of the examination. The patient has apparent normal recent and remote memory, with an apparently normal attention span and concentration ability.   Speech is normal.  Cranial nerves: Extraocular movements are full. Pupils are equal, round, and reactive to light and accomodation.   Trapezius and sternocleidomastoid strength is normal. No dysarthria is noted.  The tongue is midline, and the patient has symmetric elevation of the soft palate. No obvious hearing deficits are noted.  Motor:  Muscle bulk is normal.   Tone is inc in left leg > right. Strength is  5 / 5 in arms and right leg.   Minimal left leg weakness   Sensory: Sensory testing is intact to pinprick, soft touch and vibration sensation in arms, reduced vibration in toes.       Coordination: Cerebellar testing reveals good finger-nose-finger and  slightly reduced heel-to-shin left worse than right.  Gait and station: Station is normal.   Gait is slightly wide. Tandem gait is wide and she needs to look down. Romberg is negative.   Reflexes: Deep tendon reflexes are symmetric and brisk bilaterally, with spread at knees, worse on left.  No clonus.       DIAGNOSTIC DATA (LABS, IMAGING, TESTING) - I reviewed patient records,  labs, notes, testing and imaging myself where available.  Lab Results  Component Value Date   WBC 4.7 10/14/2015   HGB 12.8 10/07/2012   HCT 41.0 10/14/2015   MCV 93 10/14/2015   PLT 212 10/14/2015      Component Value Date/Time   NA 145 (H) 10/14/2015 0948   K 4.6 10/14/2015 0948    CL 105 10/14/2015 0948   CO2 26 10/14/2015 0948   GLUCOSE 94 10/14/2015 0948   GLUCOSE 94 09/01/2008 2213   BUN 11 10/14/2015 0948   CREATININE 0.58 10/14/2015 0948   CALCIUM 9.5 10/14/2015 0948   PROT 7.1 10/14/2015 0948   ALBUMIN 4.8 10/14/2015 0948   AST 22 10/14/2015 0948   ALT 23 10/14/2015 0948   ALKPHOS 37 (L) 10/14/2015 0948   BILITOT 0.3 10/14/2015 0948   GFRNONAA 105 10/14/2015 0948   GFRAA 121 10/14/2015 0948        ASSESSMENT AND PLAN  MULTIPLE SCLEROSIS - Plan: MR CERVICAL SPINE WO CONTRAST, CBC with Differential/Platelet, CBC with Differential/Platelet  Dysesthesia  Gait disturbance - Plan: MR CERVICAL SPINE WO CONTRAST  Other fatigue  Left leg weakness  Urinary frequency   1.   Continue Tecfidera for now. Some changes with sensation /gait.    We will check an MRI of the cervical spine to assess for subclinical progression.  If subclinical changes are occurring, we will need to consider another medication (Probably ocrelizumab or Lemtrada). 2.   Check CBC to assess for excessive lymphopenia or other changes. 3.   She is advised to continue to be active and exercises as tolerated.   4.   Consider using a cane. 5.   Ditropan for bladder. Return in 6 months or sooner if there are new or worsening neurologic symptoms or based on the results of the MRI.   Adara Kittle A. Felecia Shelling, MD, PhD 03/12/74, 2:26 PM Certified in Neurology, Clinical Neurophysiology, Sleep Medicine, Pain Medicine and Neuroimaging  [facility] [address] [place], [place] [address] [phone]

## 2016-04-14 ENCOUNTER — Telehealth: Payer: Self-pay | Admitting: *Deleted

## 2016-04-14 LAB — CBC WITH DIFFERENTIAL/PLATELET
BASOS ABS: 0 10*3/uL (ref 0.0–0.2)
Basos: 1 %
EOS (ABSOLUTE): 0.1 10*3/uL (ref 0.0–0.4)
Eos: 1 %
HEMOGLOBIN: 13.8 g/dL (ref 11.1–15.9)
Hematocrit: 39.9 % (ref 34.0–46.6)
Immature Grans (Abs): 0 10*3/uL (ref 0.0–0.1)
Immature Granulocytes: 0 %
LYMPHS ABS: 1.5 10*3/uL (ref 0.7–3.1)
LYMPHS: 29 %
MCH: 31 pg (ref 26.6–33.0)
MCHC: 34.6 g/dL (ref 31.5–35.7)
MCV: 90 fL (ref 79–97)
Monocytes Absolute: 0.4 10*3/uL (ref 0.1–0.9)
Monocytes: 9 %
NEUTROS ABS: 3.1 10*3/uL (ref 1.4–7.0)
Neutrophils: 60 %
PLATELETS: 249 10*3/uL (ref 150–379)
RBC: 4.45 x10E6/uL (ref 3.77–5.28)
RDW: 13.5 % (ref 12.3–15.4)
WBC: 5.2 10*3/uL (ref 3.4–10.8)

## 2016-04-14 NOTE — Telephone Encounter (Signed)
I have spoken with Samantha Church today and per RAS, advised labs done in our office are fine.  She verbalized understanding of same/fim

## 2016-04-14 NOTE — Telephone Encounter (Signed)
-----   Message from Britt Bottom, MD sent at 04/14/2016 12:01 PM EDT ----- Please let the patient know that the lab work is fine.

## 2016-04-26 ENCOUNTER — Ambulatory Visit
Admission: RE | Admit: 2016-04-26 | Discharge: 2016-04-26 | Disposition: A | Discharge status: Home / Self Care | Payer: Managed Care, Other (non HMO) | Source: Ambulatory Visit | Attending: Neurology | Admitting: Neurology

## 2016-04-26 ENCOUNTER — Other Ambulatory Visit: Payer: Managed Care, Other (non HMO)

## 2016-04-26 DIAGNOSIS — R269 Unspecified abnormalities of gait and mobility: Secondary | ICD-10-CM

## 2016-04-26 DIAGNOSIS — G35 Multiple sclerosis: Secondary | ICD-10-CM

## 2016-04-27 ENCOUNTER — Telehealth: Payer: Self-pay | Admitting: *Deleted

## 2016-04-27 NOTE — Telephone Encounter (Signed)
LMOM that per RAS, MRI shows no new MS lesions.  She does not need to return this call unless she has questions/fim

## 2016-04-27 NOTE — Telephone Encounter (Signed)
-----   Message from Britt Bottom, MD sent at 04/27/2016  9:04 AM EDT ----- Please let her know the MRI does not show any new lesions

## 2016-06-27 LAB — VITAMIN D 25 HYDROXY (VIT D DEFICIENCY, FRACTURES): Vit D, 25-Hydroxy: 45

## 2016-06-27 LAB — HEMOGLOBIN A1C: Hemoglobin A1C: 5

## 2016-06-27 LAB — BASIC METABOLIC PANEL
BUN: 8 (ref 4–21)
CREATININE: 0.5 (ref 0.5–1.1)
GLUCOSE: 86
Potassium: 4.3 (ref 3.4–5.3)
SODIUM: 140 (ref 137–147)

## 2016-06-27 LAB — CBC AND DIFFERENTIAL
HCT: 43 (ref 36–46)
Hemoglobin: 14.4 (ref 12.0–16.0)
Platelets: 237 (ref 150–399)
WBC: 3.9

## 2016-06-27 LAB — HEPATIC FUNCTION PANEL
ALT: 18 (ref 7–35)
AST: 19 (ref 13–35)
Alkaline Phosphatase: 42 (ref 25–125)
BILIRUBIN, TOTAL: 0.5

## 2016-06-27 LAB — LIPID PANEL
Cholesterol: 208 — AB (ref 0–200)
HDL: 81 — AB (ref 35–70)
LDL CALC: 117
LDL/HDL RATIO: 2.6
Triglycerides: 67 (ref 40–160)

## 2016-06-27 LAB — TSH: TSH: 0.88 (ref 0.41–5.90)

## 2016-07-21 ENCOUNTER — Other Ambulatory Visit: Payer: Self-pay | Admitting: Gastroenterology

## 2016-07-21 DIAGNOSIS — T50905A Adverse effect of unspecified drugs, medicaments and biological substances, initial encounter: Secondary | ICD-10-CM

## 2016-07-21 DIAGNOSIS — K208 Other esophagitis without bleeding: Secondary | ICD-10-CM

## 2016-07-25 ENCOUNTER — Ambulatory Visit
Admission: RE | Admit: 2016-07-25 | Discharge: 2016-07-25 | Disposition: A | Discharge status: Home / Self Care | Payer: Managed Care, Other (non HMO) | Source: Ambulatory Visit | Attending: Gastroenterology | Admitting: Gastroenterology

## 2016-07-25 DIAGNOSIS — K208 Other esophagitis: Principal | ICD-10-CM

## 2016-07-25 DIAGNOSIS — T50905A Adverse effect of unspecified drugs, medicaments and biological substances, initial encounter: Secondary | ICD-10-CM

## 2016-08-01 ENCOUNTER — Other Ambulatory Visit: Payer: Self-pay | Admitting: Neurology

## 2016-08-16 ENCOUNTER — Telehealth: Payer: Self-pay | Admitting: *Deleted

## 2016-08-16 NOTE — Telephone Encounter (Signed)
Received PA Request Form for Tecfidera from [facility]. completd and successfully faxed to [facility].

## 2016-08-21 NOTE — Telephone Encounter (Signed)
Fax received from [facility] (phone# [phone]).  Tecfidera approved until 08/15/21.  PA# [id number]

## 2016-08-28 ENCOUNTER — Telehealth: Payer: Self-pay | Admitting: *Deleted

## 2016-08-28 MED ORDER — DIMETHYL FUMARATE 240 MG PO CPDR: 1.0000 | DELAYED_RELEASE_CAPSULE | Freq: Two times a day (BID) | ORAL | 3 refills | Status: DC

## 2016-08-28 NOTE — Telephone Encounter (Signed)
Tecfidera escribed to [facility] per faxed request/fim

## 2016-09-18 ENCOUNTER — Telehealth: Payer: Self-pay | Admitting: Neurology

## 2016-09-18 MED ORDER — METOCLOPRAMIDE HCL 5 MG PO TABS: ORAL_TABLET | ORAL | 3 refills | Status: DC

## 2016-09-18 NOTE — Telephone Encounter (Signed)
LMTC./fim 

## 2016-09-18 NOTE — Telephone Encounter (Signed)
Pt is asking for a call back re: chest tightening, pt was asked if she needs to go to ED she said no.  Pt would like to speak with RN Faith 1st, please call

## 2016-09-18 NOTE — Telephone Encounter (Signed)
I have spoken with Samantha Church and advised that per RAS: sx. likely not related to Tecfidera or MS.  He can, however, rx. Reglan 5mg  po up to tid to see if this helps.  Samantha Church verbalized understanding of same, is agreeable to trying Reglan.  Rx. escribed to [facility] per her request/fim

## 2016-09-18 NOTE — Telephone Encounter (Signed)
Pt has called back and is now asking for a call back please

## 2016-09-18 NOTE — Addendum Note (Signed)
Addended by: France Ravens I on: 09/18/2016 04:43 PM   Modules accepted: Orders

## 2016-10-24 ENCOUNTER — Ambulatory Visit (INDEPENDENT_AMBULATORY_CARE_PROVIDER_SITE_OTHER): Payer: 59 | Admitting: Neurology

## 2016-10-24 ENCOUNTER — Other Ambulatory Visit: Payer: Self-pay | Admitting: Gastroenterology

## 2016-10-24 ENCOUNTER — Encounter: Payer: Self-pay | Admitting: Neurology

## 2016-10-24 VITALS — BP 115/78 | HR 66 | Resp 16 | Ht 66.0 in | Wt 145.0 lb

## 2016-10-24 DIAGNOSIS — Q438 Other specified congenital malformations of intestine: Secondary | ICD-10-CM

## 2016-10-24 DIAGNOSIS — G35 Multiple sclerosis: Secondary | ICD-10-CM | POA: Diagnosis not present

## 2016-10-24 DIAGNOSIS — Z800 Family history of malignant neoplasm of digestive organs: Secondary | ICD-10-CM

## 2016-10-24 DIAGNOSIS — R269 Unspecified abnormalities of gait and mobility: Secondary | ICD-10-CM | POA: Diagnosis not present

## 2016-10-24 DIAGNOSIS — R5383 Other fatigue: Secondary | ICD-10-CM

## 2016-10-24 DIAGNOSIS — R29898 Other symptoms and signs involving the musculoskeletal system: Secondary | ICD-10-CM

## 2016-10-24 DIAGNOSIS — R350 Frequency of micturition: Secondary | ICD-10-CM

## 2016-10-24 NOTE — Progress Notes (Signed)
[facility]  PATIENT: Samantha Church DOB: [birth year]  REFERRING DOCTOR OR PCP:  Donald Prose SOURCE: Patient and records from [facility] neurology.  _________________________________   HISTORICAL  CHIEF COMPLAINT:  Chief Complaint  Patient presents with  . Multiple Sclerosis    Sts. she continues to tolerate Tecfidera well.  Feels walking is some worse--drags her left leg at times/fim    HISTORY OF PRESENT ILLNESS:  Samantha Church is a 56 year old woman with multiple sclerosis reporting more numbness and gait issues.    Update 10/24/2016::   She notes that the left leg is not moving as well.    She thinks the onset was gradual and has continued to progress.    She feels she does not have complete control over the leg.   She is concerned it may buckle.     Her left side has always been the worse.   Though gait feels mildly unsteady, she has no falls.    She is now using a cane for longer distances.   Bladder function is unchanged. Oxybutynin has helped. She notes some fatigue but this is stable. No change in cognition.  We reiviewed the MRI of the cervical spine from 04/26/2016.  She has multiple small lesions within the spinal cord including 3 on the left. When compared to the MRI from last year, there is no definite change.                                                                                                                                                                         _______________________________________ From 04/13/2016:  .   MS:  She is on Tecfidera and tolerates it well.   She switched to it from [place] in 2014 (was on Tysabri but converted to JCV Ab positive).      MRI of the brain performed October 2015 showed 1 focus not present on the December 2014 MRI.   2017 MRI of the brain was stable compared to 2016.   MRI of the cervical spine 2017 (after onset of numbness below neck) showed 4 foci (one not present in 2008) and MRi T-spine showed  two foci, neither present in 2008.     Gait/strength:    Gait is worse and she notes more left sided clumsiness and weakness.   Balance is off.   She does not use a cane.    Legs has some spasticity which seems stable.    She feels she needs to look down to walk.     Gait is wider and slower.    Arms are fine as far as strength.   Balance is much worse with eyes closed.   She has trouble  going downstairs and can't lead with the left leg.  She has trouble going upstairs if she has her hands full.  Numbness:   She is noting more numbness in both hands.  Numbness is below her neck bilaterally.       MS History:   She was diagnosed in 2007 after presenting with right sided numbness and mild clumsiness / allodynia.    MRI was consistent with MS and she was started with Betaseron.  She then switched to Copaxone but had exacerbations and switched to Tysabri.     She converted to Tysabri but became JCV Ab positive and switched to Gilenya.   Due to exacerbations on Gilenya, she switched back to Tysabri x one year and then switched to Tecfidera (in 2014).     Bladder/Bowel:   She notes more urinary frequency and urgency.  No incontinence recently.  Fatigue/sleep:   She still has physical and cognitive fatigue.   She is sleepy around 2 pm and naps every afternoon x 1 hour.    She feels very refreshed after a one hour nap and does better the rest of the day.   She has Provigil but never took it because the nap helps.   She falls asleep easily at night and sleeps 8 hours.   (9-5 am) .  Mood/Cogntion:   She denies any depression or anxiety.   She denies major cognitive problems but she notes reduced attention and focus at times.  She has no verbal fluencies issues.      REVIEW OF SYSTEMS: Constitutional: No fevers, chills, sweats, or change in appetite.   She has fatigue Eyes: No visual changes, double vision, eye pain Ear, nose and throat: No hearing loss, ear pain, nasal congestion, sore  throat Cardiovascular: No chest pain, palpitations Respiratory: No shortness of breath at rest or with exertion.   No wheezes GastrointestinaI: No nausea, vomiting, diarrhea, abdominal pain, fecal incontinence Genitourinary: No dysuria, urinary retention or frequency.  No nocturia. Musculoskeletal: No neck pain, back pain Integumentary: No rash, pruritus, skin lesions Neurological: as above Psychiatric: No depression at this time.  No anxiety Endocrine: She has hypothyroidism.  No palpitations, diaphoresis, change in appetite, change in weigh or increased thirst Hematologic/Lymphatic: No anemia, purpura, petechiae. Allergic/Immunologic: No itchy/runny eyes, nasal congestion, recent allergic reactions, rashes  ALLERGIES: No Known Allergies  HOME MEDICATIONS:  Current Outpatient Prescriptions:  .  Ascorbic Acid 500 MG/15ML SYRP, Take by mouth., Disp: , Rfl:  .  cholecalciferol (VITAMIN D) 1000 units tablet, Take 5,000 Units by mouth daily., Disp: , Rfl:  .  Dimethyl Fumarate (TECFIDERA) 240 MG CPDR, Take 1 capsule (240 mg total) by mouth 2 (two) times daily., Disp: 180 capsule, Rfl: 3 .  magnesium 30 MG tablet, Take 30 mg by mouth 2 (two) times daily., Disp: , Rfl:  .  NP THYROID 60 MG tablet, , Disp: , Rfl:  .  Omega-3 Fatty Acids (FISH OIL PO), Take 2 tablets by mouth 2 (two) times daily., Disp: , Rfl:  .  oxybutynin (DITROPAN XL) 10 MG 24 hr tablet, Take 1 tablet (10 mg total) by mouth at bedtime., Disp: 30 tablet, Rfl: 5 .  modafinil (PROVIGIL) 200 MG tablet, Take 1 tablet (200 mg total) by mouth daily. (Patient not taking: Reported on 10/24/2016), Disp: 30 tablet, Rfl: 5 No current facility-administered medications for this visit.   Facility-Administered Medications Ordered in Other Visits:  .  gadopentetate dimeglumine (MAGNEVIST) injection 13 mL, 13 mL, Intravenous, Once PRN,  Romen Yutzy A, MD .  gadopentetate dimeglumine (MAGNEVIST) injection 13 mL, 13 mL, Intravenous,  Once PRN, Kanna Dafoe A, MD .  gadopentetate dimeglumine (MAGNEVIST) injection 13 mL, 13 mL, Intravenous, Once PRN, Zeph Riebel, Nanine Means, MD  PAST MEDICAL HISTORY: Past Medical History:  Diagnosis Date  . Anemia   . Lyme disease   . Multiple sclerosis ([place])   . Neuromuscular disorder ([place])    MS  . Vision abnormalities     PAST SURGICAL HISTORY: Past Surgical History:  Procedure Laterality Date  . BREAST SURGERY    . DILATATION & CURRETTAGE/HYSTEROSCOPY WITH RESECTOCOPE N/A 10/09/2012   Procedure: DILATATION & CURETTAGE/HYSTEROSCOPY WITH RESECTOCOPE;  Surgeon: [name], MD;  Location: [facility];  Service: Gynecology;  Laterality: N/A;  RESECTION with Versapoint   . KNEE ARTHROSCOPY  1996    FAMILY HISTORY: Family History  Problem Relation Age of Onset  . Hypertension Mother   . Colon cancer Father     SOCIAL HISTORY:  Social History   Social History  . Marital status: Married    Spouse name: N/A  . Number of children: N/A  . Years of education: N/A   Occupational History  . Not on file.   Social History Main Topics  . Smoking status: Never Smoker  . Smokeless tobacco: Never Used  . Alcohol use Yes     Comment: 2x month  . Drug use: No  . Sexual activity: Not on file   Other Topics Concern  . Not on file   Social History Narrative  . No narrative on file     PHYSICAL EXAM  Vitals:   10/24/16 1557  BP: 115/78  Pulse: 66  Resp: 16  Weight: 145 lb (65.8 kg)  Height: 5\' 6"  (1.676 m)    Body mass index is 23.4 kg/m.   General: The patient is well-developed and well-nourished and in no acute distress  Musculoskeletal:  Back is nontender  Neurologic Exam  Mental status: The patient is alert and oriented x 3 at the time of the examination. The patient has apparent normal recent and remote memory, with an apparently normal attention span and concentration ability.   Speech is normal.  Cranial nerves: Extraocular movements are full. Pupils are  equal, round, and reactive to light and accomodation.   Trapezius and sternocleidomastoid strength is normal. No dysarthria is noted.  The tongue is midline, and the patient has symmetric elevation of the soft palate. No obvious hearing deficits are noted.  Motor:  Muscle bulk is normal.   She has mildly increased muscle tone in the legs, left greater than right.. Strength is  5 / 5 in arms and right leg.   Minimal left leg weakness   Sensory: Sensory testing is intact to pinprick, soft touch and vibration sensation in arms, reduced vibration in toes.       Coordination: Cerebellar testing reveals good finger-nose-finger and slightly reduced heel-to-shin, left worse than right..  Gait and station: Station is normal.   Gait is slightly wide. Tandem gait is wide and she needs to look down. Romberg is negative.   Reflexes: Deep tendon reflexes are brisk bilaterally. There is spread at the knees, left greater the right. No ankle clonus.     25 foot timed walk is 7.8 seconds (average of 2 trials).      DIAGNOSTIC DATA (LABS, IMAGING, TESTING) - I reviewed patient records, labs, notes, testing and imaging myself where available.  Lab Results  Component Value Date  WBC 5.2 04/13/2016   HGB 13.8 04/13/2016   HCT 39.9 04/13/2016   MCV 90 04/13/2016   PLT 249 04/13/2016      Component Value Date/Time   NA 145 (H) 10/14/2015 0948   K 4.6 10/14/2015 0948   CL 105 10/14/2015 0948   CO2 26 10/14/2015 0948   GLUCOSE 94 10/14/2015 0948   GLUCOSE 94 09/01/2008 2213   BUN 11 10/14/2015 0948   CREATININE 0.58 10/14/2015 0948   CALCIUM 9.5 10/14/2015 0948   PROT 7.1 10/14/2015 0948   ALBUMIN 4.8 10/14/2015 0948   AST 22 10/14/2015 0948   ALT 23 10/14/2015 0948   ALKPHOS 37 (L) 10/14/2015 0948   BILITOT 0.3 10/14/2015 0948   GFRNONAA 105 10/14/2015 0948   GFRAA 121 10/14/2015 0948        ASSESSMENT AND PLAN  Multiple sclerosis (HCC) - Plan: CBC with Differential/Platelet,  Comprehensive metabolic panel  Other fatigue  Gait disturbance  Urinary frequency  Left leg weakness   1.   Continue Tecfidera For her relapsing multiple sclerosis. She has had slow worsening of the left leg over the past couple of years and we discussed that some studies showed some supplements like biotin and alpha lipoic acid may help progression.  2.   Check CBC to assess for excessive lymphopenia or other changes. I'll also check a BMP to make sure the kidney function is good.    Ampyra for MS related gait disturbance 3.   She is advised to continue to be active and exercises as tolerated.   4.   Return in 6 months or sooner if there are new or worsening neurologic symptoms or based on the results of the MRI.   Avannah Decker A. Felecia Shelling, MD, PhD 11/94/1740, 8:14 PM Certified in Neurology, Clinical Neurophysiology, Sleep Medicine, Pain Medicine and Neuroimaging  [facility] [address] [place], [place] [address] [phone]

## 2016-10-25 LAB — CBC WITH DIFFERENTIAL/PLATELET
BASOS: 0 %
Basophils Absolute: 0 10*3/uL (ref 0.0–0.2)
EOS (ABSOLUTE): 0.1 10*3/uL (ref 0.0–0.4)
Eos: 1 %
HEMOGLOBIN: 13.3 g/dL (ref 11.1–15.9)
Hematocrit: 40.1 % (ref 34.0–46.6)
Immature Grans (Abs): 0 10*3/uL (ref 0.0–0.1)
Immature Granulocytes: 0 %
LYMPHS: 32 %
Lymphocytes Absolute: 1.6 10*3/uL (ref 0.7–3.1)
MCH: 30.4 pg (ref 26.6–33.0)
MCHC: 33.2 g/dL (ref 31.5–35.7)
MCV: 92 fL (ref 79–97)
MONOCYTES: 8 %
Monocytes Absolute: 0.4 10*3/uL (ref 0.1–0.9)
NEUTROS ABS: 2.8 10*3/uL (ref 1.4–7.0)
Neutrophils: 59 %
PLATELETS: 265 10*3/uL (ref 150–379)
RBC: 4.38 x10E6/uL (ref 3.77–5.28)
RDW: 14 % (ref 12.3–15.4)
WBC: 4.8 10*3/uL (ref 3.4–10.8)

## 2016-10-25 LAB — COMPREHENSIVE METABOLIC PANEL
ALT: 15 IU/L (ref 0–32)
AST: 18 IU/L (ref 0–40)
Albumin/Globulin Ratio: 2.1 (ref 1.2–2.2)
Albumin: 4.6 g/dL (ref 3.5–5.5)
Alkaline Phosphatase: 51 IU/L (ref 39–117)
BUN/Creatinine Ratio: 20 (ref 9–23)
BUN: 12 mg/dL (ref 6–24)
CALCIUM: 9.1 mg/dL (ref 8.7–10.2)
CHLORIDE: 104 mmol/L (ref 96–106)
CO2: 25 mmol/L (ref 20–29)
Creatinine, Ser: 0.59 mg/dL (ref 0.57–1.00)
GFR calc non Af Amer: 104 mL/min/{1.73_m2} (ref >59)
GFR, EST AFRICAN AMERICAN: 119 mL/min/{1.73_m2} (ref >59)
GLUCOSE: 92 mg/dL (ref 65–99)
Globulin, Total: 2.2 g/dL (ref 1.5–4.5)
POTASSIUM: 4.4 mmol/L (ref 3.5–5.2)
Sodium: 141 mmol/L (ref 134–144)
TOTAL PROTEIN: 6.8 g/dL (ref 6.0–8.5)

## 2016-10-26 ENCOUNTER — Encounter: Payer: Self-pay | Admitting: *Deleted

## 2016-10-26 ENCOUNTER — Telehealth: Payer: Self-pay | Admitting: *Deleted

## 2016-10-26 ENCOUNTER — Encounter: Payer: Self-pay | Admitting: Neurology

## 2016-10-26 NOTE — Telephone Encounter (Signed)
-----   Message from Britt Bottom, MD sent at 10/25/2016 11:00 PM EDT ----- Please let the patient know that the lab work is fine.

## 2016-10-26 NOTE — Telephone Encounter (Signed)
Lab results sent via My Chart/fim

## 2016-11-07 ENCOUNTER — Encounter: Payer: Self-pay | Admitting: Internal Medicine

## 2016-11-07 ENCOUNTER — Ambulatory Visit (INDEPENDENT_AMBULATORY_CARE_PROVIDER_SITE_OTHER): Payer: Self-pay | Admitting: Internal Medicine

## 2016-11-07 VITALS — BP 118/80 | HR 65 | Ht 67.0 in | Wt 143.0 lb

## 2016-11-07 DIAGNOSIS — E039 Hypothyroidism, unspecified: Secondary | ICD-10-CM | POA: Diagnosis not present

## 2016-11-07 LAB — T4, FREE: Free T4: 0.85 ng/dL (ref 0.60–1.60)

## 2016-11-07 LAB — TSH: TSH: 0.75 u[IU]/mL (ref 0.35–4.50)

## 2016-11-07 LAB — T3, FREE: T3 FREE: 4.1 pg/mL (ref 2.3–4.2)

## 2016-11-07 MED ORDER — NP THYROID 60 MG PO TABS: 60.0000 mg | ORAL_TABLET | Freq: Every day | ORAL | 3 refills | Status: DC

## 2016-11-07 NOTE — Progress Notes (Signed)
Patient ID: Samantha Church, female   DOB: [birth year], 56 y.o.   MRN: [id number]    HPI  Samantha Church is a 56 y.o.-year-old female, referred by her PCP, Dr. Nancy Fetter, for management of hypothyroidism.  Pt. has been dx with hypothyroidism in ~2013 >> on Synthroid, then changed to NP thyroid 60 mg daily at [facility]  She takes the thyroid hormone: - fasting - with water - + coffee + butter - separated by >30 min from b'fast  - no calcium, iron, PPIs, multivitamins  - no Biotin  I reviewed pt's thyroid tests: She had more recent tests at [facility] >> will try to send them to me. Lab Results  Component Value Date   TSH 1.150 05/14/2015    Pt describes: - + fatigue - no weight gain - hot flushes (perimenopausal) - + palpitations - occas. At night - no depression or anxiety - + constipation - no dry skin - no hair loss  Pt denies feeling nodules in neck, but has occasional dysphagia/no odynophagia, no SOB with lying down. She has hoarseness.  Of note, a thyroid U.S (05/07/2007) was normal.  She has no FH of thyroid disorders. + FH of thyroid cancer in brother and sister: PTC. No h/o radiation tx to head or neck. No recent use of iodine supplements.  Pt. also has a history of MS.  ROS: Constitutional: + see HPI Eyes: no blurry vision, no xerophthalmia ENT: no sore throat, no nodules palpated in throat, no dysphagia/odynophagia, no hoarseness, + tinnitus Cardiovascular: no CP/SOB/+ palpitations/no leg swelling Respiratory: no cough/SOB Gastrointestinal: no N/V/D/+ C/+ heartburn Musculoskeletal: + muscle/no joint aches Skin: no rashes Neurological: no tremors/numbness/tingling/dizziness Psychiatric: no depression/anxiety + low libido  Past Medical History:  Diagnosis Date  . Anemia   . Lyme disease   . Multiple sclerosis ([place])   . Neuromuscular disorder ([place])    MS  . Vision abnormalities    Past Surgical History:  Procedure Laterality  Date  . BREAST SURGERY    . DILATATION & CURRETTAGE/HYSTEROSCOPY WITH RESECTOCOPE N/A 10/09/2012   Procedure: DILATATION & CURETTAGE/HYSTEROSCOPY WITH RESECTOCOPE;  Surgeon: [name], MD;  Location: [facility];  Service: Gynecology;  Laterality: N/A;  RESECTION with Versapoint   . KNEE ARTHROSCOPY  1996   Social History   Social History  . Marital status: Married    Spouse name: N/A  . Number of children: 4   Occupational History  . Accounting   Social History Main Topics  . Smoking status: Never Smoker  . Smokeless tobacco: Never Used  . Alcohol use Yes     Comment: wine, nightly  . Drug use: No   Current Outpatient Prescriptions on File Prior to Visit  Medication Sig Dispense Refill  . Ascorbic Acid 500 MG/15ML SYRP Take by mouth.    . cholecalciferol (VITAMIN D) 1000 units tablet Take 5,000 Units by mouth daily.    . Dimethyl Fumarate (TECFIDERA) 240 MG CPDR Take 240 mg by mouth 2 (two) times daily.    . magnesium 30 MG tablet Take 30 mg by mouth 4 (four) times daily.     . NP THYROID 60 MG tablet     . Omega-3 Fatty Acids (FISH OIL PO) Take 2 tablets by mouth 2 (two) times daily.    . modafinil (PROVIGIL) 200 MG tablet Take 1 tablet (200 mg total) by mouth daily. (Patient not taking: Reported on 10/24/2016) 30 tablet 5  . oxybutynin (DITROPAN XL) 10 MG  24 hr tablet Take 1 tablet (10 mg total) by mouth at bedtime. (Patient not taking: Reported on 11/07/2016) 30 tablet 5   Current Facility-Administered Medications on File Prior to Visit  Medication Dose Route Frequency Provider Last Rate Last Dose  . gadopentetate dimeglumine (MAGNEVIST) injection 13 mL  13 mL Intravenous Once PRN Sater, Richard A, MD      . gadopentetate dimeglumine (MAGNEVIST) injection 13 mL  13 mL Intravenous Once PRN Sater, Richard A, MD      . gadopentetate dimeglumine (MAGNEVIST) injection 13 mL  13 mL Intravenous Once PRN Sater, Nanine Means, MD       No Known Allergies Family History  Problem  Relation Age of Onset  . Hypertension Mother   . Colon cancer Father    Also, DM in PGF; HL, heart ds in M.  PE: BP 118/80 (BP Location: Left Arm, Patient Position: Sitting)   Pulse 65   Ht _0  (1.702 m)   Wt 143 lb (64.9 kg)   LMP 03/01/2016   SpO2 98%   BMI 22.40 kg/m  Wt Readings from Last 3 Encounters:  11/07/16 143 lb (64.9 kg)  10/24/16 145 lb (65.8 kg)  04/13/16 143 lb 8 oz (65.1 kg)   Constitutional: normal weight, in NAD Eyes: PERRLA, EOMI, no exophthalmos ENT: moist mucous membranes, no thyromegaly, no cervical lymphadenopathy Cardiovascular: RRR, No MRG Respiratory: CTA B Gastrointestinal: abdomen soft, NT, ND, BS+ Musculoskeletal: no deformities, strength intact in all 4 Skin: moist, warm, no rashes Neurological: no tremor with outstretched hands, DTR normal in all 4  ASSESSMENT: 1. Hypothyroidism  PLAN:  1. Patient with long-standing hypothyroidism, on desiccated thyroid extract. She is feeling well on this dose, but has fatigue and occas. Palpitations at night. She has hot flushes but going through menopause now. - she appears euthyroid.  - she does not appear to have a goiter, thyroid nodules, or neck compression symptoms - We discussed about correct intake of the thyroid med, fasting, with water, separated by at least 30 minutes from breakfast, and separated by more than 4 hours from calcium, iron, multivitamins, acid reflux medications (PPIs). She is taking it correctly. - will check thyroid tests today: TSH, free T4, and will ass TPO and ATA Abs. She c/o occasional pbs swallowing >> discussed that she may have Hashimoto's thyroiditis >> discussed the autoimmune nature of the ds and that thyroid swelling is not uncommon with this disorder. We can use Selenium if Ab's are elevated, to see if fatigue would improve. - If labs today are abnormal, she will need to return in ~6 weeks for repeat labs - we also discussed about Biotin >> she plans to start for MS >>  discussed to be off x at least 1 week before lab draw - I will see her back in 6 months  Needs refills.  Component     Latest Ref Rng & Units 11/07/2016  TSH     0.35 - 4.50 uIU/mL 0.75  T4,Free(Direct)     0.60 - 1.60 ng/dL 0.85  Triiodothyronine,Free,Serum     2.3 - 4.2 pg/mL 4.1  Thyroglobulin Ab     < or = 1 IU/mL <1  Thyroperoxidase Ab SerPl-aCnc     <9 IU/mL <1  No signs of Hashimoto's thyroiditis. TFTs normal.  Philemon Kingdom, MD PhD [facility]

## 2016-11-07 NOTE — Patient Instructions (Signed)
Please continue NP thyroid 60 mg daily.  Take the thyroid hormone every day, with water, at least 30 minutes before breakfast, separated by at least 4 hours from: - acid reflux medications - calcium - iron - multivitamins  Please stop at the lab.  Please send me the records from [facility].  Please come back for a follow-up appointment in 6 months.

## 2016-11-08 ENCOUNTER — Encounter: Payer: Self-pay | Admitting: Internal Medicine

## 2016-11-08 ENCOUNTER — Other Ambulatory Visit: Payer: Managed Care, Other (non HMO)

## 2016-11-08 LAB — THYROID PEROXIDASE ANTIBODY

## 2016-11-08 LAB — THYROGLOBULIN ANTIBODY

## 2016-11-16 ENCOUNTER — Inpatient Hospital Stay
Admission: RE | Admit: 2016-11-16 | Discharge: 2016-11-16 | Disposition: A | Discharge status: Home / Self Care | Payer: Self-pay | Source: Ambulatory Visit | Attending: Gastroenterology | Admitting: Gastroenterology

## 2016-11-24 ENCOUNTER — Ambulatory Visit
Admission: RE | Admit: 2016-11-24 | Discharge: 2016-11-24 | Disposition: A | Discharge status: Home / Self Care | Payer: 59 | Source: Ambulatory Visit | Attending: Gastroenterology | Admitting: Gastroenterology

## 2016-11-24 DIAGNOSIS — Q438 Other specified congenital malformations of intestine: Secondary | ICD-10-CM

## 2016-11-24 DIAGNOSIS — Z800 Family history of malignant neoplasm of digestive organs: Secondary | ICD-10-CM

## 2017-01-09 HISTORY — PX: UTERINE FIBROID SURGERY: SHX826

## 2017-01-09 HISTORY — PX: OTHER SURGICAL HISTORY: SHX169

## 2017-02-12 ENCOUNTER — Ambulatory Visit: Payer: 59 | Admitting: Family Medicine

## 2017-02-12 ENCOUNTER — Encounter: Payer: Self-pay | Admitting: Family Medicine

## 2017-02-12 VITALS — BP 136/76 | HR 78 | Temp 98.1°F | Ht 65.5 in | Wt 144.4 lb

## 2017-02-12 DIAGNOSIS — G35 Multiple sclerosis: Secondary | ICD-10-CM

## 2017-02-12 DIAGNOSIS — E039 Hypothyroidism, unspecified: Secondary | ICD-10-CM

## 2017-02-12 DIAGNOSIS — Z7689 Persons encountering health services in other specified circumstances: Secondary | ICD-10-CM | POA: Diagnosis not present

## 2017-02-12 NOTE — Assessment & Plan Note (Signed)
Followed by endo. Records reviewed. No changes made.

## 2017-02-12 NOTE — Progress Notes (Signed)
Subjective:   Patient ID: Samantha Church, female    DOB: Nov 13, 1960, 57 y.o.   MRN: [id number]  Samantha Church is a pleasant 57 y.o. year old female who presents to clinic today with New Patient (Initial Visit) (Patient is here today to establish care. She is not currently fasting. She declines the flu and tdap today and states that she will discuss them with her MS doctor.  She is UTD with her PAP and Mammogram and gets those Oct of every year.)  on 02/12/2017  HPI:  Hypothyroidism- followed by Dr. Cruzita Lederer.  Last seen on 11/07/16.  Note reviewed. Taking NP thyroid 60 mg daily. No changes made to rxs. Lab Results  Component Value Date   TSH 0.75 11/07/2016    Multiple Sclerosis- followed by Dr. Felecia Shelling. Last seen on 10/24/16. Note reviewed.  Advised to continue Tecfidera and Ampyra. He also discussed that some studies have shown benefit of biotin and alpha lipoic acid in slowing the progression of MS.  She has no complaints today. Current Outpatient Medications on File Prior to Visit  Medication Sig Dispense Refill  . Ascorbic Acid 500 MG/15ML SYRP Take by mouth.    . Cholecalciferol (VITAMIN D-3) 5000 units TABS Take 1 tablet by mouth daily.    . Dimethyl Fumarate (TECFIDERA) 240 MG CPDR Take 240 mg by mouth 2 (two) times daily.    . magnesium 30 MG tablet Take 30 mg by mouth 4 (four) times daily.     . NP THYROID 60 MG tablet Take 1 tablet (60 mg total) by mouth daily before breakfast. 90 tablet 3   Current Facility-Administered Medications on File Prior to Visit  Medication Dose Route Frequency Provider Last Rate Last Dose  . gadopentetate dimeglumine (MAGNEVIST) injection 13 mL  13 mL Intravenous Once PRN Sater, Richard A, MD      . gadopentetate dimeglumine (MAGNEVIST) injection 13 mL  13 mL Intravenous Once PRN Sater, Richard A, MD      . gadopentetate dimeglumine (MAGNEVIST) injection 13 mL  13 mL Intravenous Once PRN Sater, Nanine Means, MD        No Known Allergies  Past  Medical History:  Diagnosis Date  . Anemia   . Lyme disease   . Multiple sclerosis ([place])   . Neuromuscular disorder ([place])    MS  . Vision abnormalities     Past Surgical History:  Procedure Laterality Date  . BREAST SURGERY    . DILATATION & CURRETTAGE/HYSTEROSCOPY WITH RESECTOCOPE N/A 10/09/2012   Procedure: DILATATION & CURETTAGE/HYSTEROSCOPY WITH RESECTOCOPE;  Surgeon: [name], MD;  Location: [facility];  Service: Gynecology;  Laterality: N/A;  RESECTION with Versapoint   . KNEE ARTHROSCOPY  1996    Family History  Problem Relation Age of Onset  . Hypertension Mother   . Colon cancer Father     Social History   Socioeconomic History  . Marital status: Married    Spouse name: Not on file  . Number of children: Not on file  . Years of education: Not on file  . Highest education level: Not on file  Social Needs  . Financial resource strain: Not on file  . Food insecurity - worry: Not on file  . Food insecurity - inability: Not on file  . Transportation needs - medical: Not on file  . Transportation needs - non-medical: Not on file  Occupational History  . Not on file  Tobacco Use  . Smoking status: Never Smoker  .  Smokeless tobacco: Never Used  Substance and Sexual Activity  . Alcohol use: Yes    Comment: 2x month  . Drug use: No  . Sexual activity: Not on file  Other Topics Concern  . Not on file  Social History Narrative  . Not on file   The PMH, PSH, Social History, Family History, Medications, and allergies have been reviewed in [facility], and have been updated if relevant.   Review of Systems  Constitutional: Negative.   HENT: Negative.   Respiratory: Negative.   Cardiovascular: Negative.   Gastrointestinal: Negative.   Musculoskeletal: Negative.   Neurological: Negative.   Hematological: Negative.   Psychiatric/Behavioral: Negative.   All other systems reviewed and are negative.      Objective:    BP 136/76 (BP Location: Left Arm, Patient  Position: Sitting, Cuff Size: Normal)   Pulse 78   Temp 98.1 F (36.7 C) (Oral)   Ht 5' 5.5" (1.664 m)   Wt 144 lb 6.4 oz (65.5 kg)   LMP 02/10/2015   SpO2 98%   BMI 23.66 kg/m    Physical Exam   General:  Well-developed,well-nourished,in no acute distress; alert,appropriate and cooperative throughout examination Head:  normocephalic and atraumatic.   Eyes:  vision grossly intact, PERRL Ears:  R ear normal and L ear normal externally, TMs clear bilaterally Nose:  no external deformity.   Mouth:  good dentition.   Neck:  No deformities, masses, or tenderness noted. Lungs:  Normal respiratory effort, chest expands symmetrically. Lungs are clear to auscultation, no crackles or wheezes. Heart:  Normal rate and regular rhythm. S1 and S2 normal without gallop, murmur, click, rub or other extra sounds. Abdomen:  Bowel sounds positive,abdomen soft and non-tender without masses, organomegaly or hernias noted. Msk:  No deformity or scoliosis noted of thoracic or lumbar spine.   Extremities:  No clubbing, cyanosis, edema, or deformity noted with normal full range of motion of all joints.   Neurologic:  alert & oriented X3 and gait normal.   Skin:  Intact without suspicious lesions or rashes Cervical Nodes:  No lymphadenopathy noted Axillary Nodes:  No palpable lymphadenopathy Psych:  Cognition and judgment appear intact. Alert and cooperative with normal attention span and concentration. No apparent delusions, illusions, hallucinations       Assessment & Plan:   MULTIPLE SCLEROSIS  Hypothyroidism, unspecified type No Follow-up on file.

## 2017-02-12 NOTE — Assessment & Plan Note (Signed)
>  25 minutes spent in face to face time with patient, >50% spent in counselling or coordination of care reviewing history and records.

## 2017-02-12 NOTE — Patient Instructions (Signed)
Great to meet you. Please drop off those lab results like we discussed.

## 2017-02-12 NOTE — Assessment & Plan Note (Signed)
Followed by neurology. Records reviewed.

## 2017-02-20 ENCOUNTER — Encounter: Payer: Self-pay | Admitting: Family Medicine

## 2017-02-20 NOTE — Progress Notes (Signed)
[id number] 

## 2017-02-25 ENCOUNTER — Encounter: Payer: Self-pay | Admitting: Family Medicine

## 2017-02-27 ENCOUNTER — Encounter: Payer: Self-pay | Admitting: Family Medicine

## 2017-02-27 NOTE — Progress Notes (Signed)
EBV Ag Ab->750 EBV Viral Capsid Ag Ab(IgM)- <35 EBV Nuclear AgAb(IgG)-260 Estradiol-27 Progesterone-<0.5 Total Cortisol-15.7

## 2017-05-07 ENCOUNTER — Ambulatory Visit: Payer: 59 | Admitting: Internal Medicine

## 2017-05-07 ENCOUNTER — Encounter: Payer: Self-pay | Admitting: Internal Medicine

## 2017-05-07 VITALS — BP 114/74 | HR 73 | Ht 65.5 in | Wt 146.2 lb

## 2017-05-07 DIAGNOSIS — E039 Hypothyroidism, unspecified: Secondary | ICD-10-CM | POA: Diagnosis not present

## 2017-05-07 LAB — T4, FREE: FREE T4: 0.78 ng/dL (ref 0.60–1.60)

## 2017-05-07 LAB — TSH: TSH: 0.7 u[IU]/mL (ref 0.35–4.50)

## 2017-05-07 LAB — T3, FREE: T3, Free: 4.3 pg/mL — ABNORMAL HIGH (ref 2.3–4.2)

## 2017-05-07 MED ORDER — NP THYROID 60 MG PO TABS: 60.0000 mg | ORAL_TABLET | Freq: Every day | ORAL | 3 refills | Status: DC

## 2017-05-07 NOTE — Patient Instructions (Addendum)
Please continue NP thyroid 60 mg daily.  Take the thyroid hormone every day, with water, at least 30 minutes before breakfast, separated by at least 4 hours from: - acid reflux medications - calcium - iron - multivitamins  Please stop at the lab.  Please come back for a follow-up appointment in 1 year.  

## 2017-05-07 NOTE — Progress Notes (Signed)
Patient ID: Samantha Church, female   DOB: 17-Feb-1960, 57 y.o.   MRN: [id number]    HPI  Samantha Church is a 57 y.o.-year-old female, returning for follow-up for hypothyroidism.  Last visit 6 months ago.  Pt. has been dx with hypothyroidism in ~2013 >> on Synthroid, then changed to NP thyroid  60 mg daily at [facility]   We continued the above dose at last visit.  Pt takes NP thyroid: - in am - fasting - At last visit, she was taking it with coffee + butter (stopped the butter), but we moved these at least 30 minutes later - at least 1h from b'fast - no Ca, Fe, MVI, PPIs - not on Biotin yet, but will start  I reviewed patient's thyroid tests: Lab Results  Component Value Date   TSH 0.75 11/07/2016   TSH 0.88 06/27/2016   TSH 1.150 05/14/2015   TSH 1.87 11/24/2010   FREET4 0.85 11/07/2016    At last visit, investigation for Hashimoto's thyroiditis was negative: Component     Latest Ref Rng & Units 11/07/2016  Thyroglobulin Ab     < or = 1 IU/mL <1  Thyroperoxidase Ab SerPl-aCnc     <9 IU/mL <1   Patient mentions -Fatigue -Hot flashes, perimenopausal -Occasional palpitations - seldom, during sleep  Pt denies: - feeling nodules in neck - hoarseness - dysphagia - choking - SOB with lying down  Thyroid U.S (05/07/2007) report reviewed: Normal.  She has no FH of thyroid disorders. + FH of thyroid cancer in brother and sister: PTC. No h/o radiation tx to head or neck. No recent contrast studies. No herbal supplements. No Biotin use. No recent steroids use.   She has a history of MS.  ROS: Constitutional: no weight gain/no weight loss, no fatigue, no subjective hyperthermia, no subjective hypothermia Eyes: no blurry vision, no xerophthalmia ENT: no sore throat, + see HPI Cardiovascular: no CP/no SOB/no palpitations/no leg swelling Respiratory: no cough/no SOB/no wheezing Gastrointestinal: no N/no V/no D/no C/no acid reflux Musculoskeletal: no  muscle aches/no joint aches Skin: no rashes, no hair loss Neurological: no tremors/no numbness/no tingling/no dizziness  I reviewed pt's medications, allergies, PMH, social hx, family hx, and changes were documented in the history of present illness. Otherwise, unchanged from my initial visit note.  Past Medical History:  Diagnosis Date  . Anemia   . Hypothyroidism    Dr. [name]  . Lyme disease   . Multiple sclerosis ([place])   . Neuromuscular disorder ([place])    MS  . Vision abnormalities    Past Surgical History:  Procedure Laterality Date  . BREAST SURGERY  2002  . COLONOSCOPY  2008  . DILATATION & CURRETTAGE/HYSTEROSCOPY WITH RESECTOCOPE N/A 10/09/2012   Procedure: DILATATION & CURETTAGE/HYSTEROSCOPY WITH RESECTOCOPE;  Surgeon: [name], MD;  Location: [facility];  Service: Gynecology;  Laterality: N/A;  RESECTION with Versapoint   . fibroid  2019   fibroidectomy  . KNEE ARTHROSCOPY  1996  . KNEE ARTHROSCOPY  1996   Social History   Social History  . Marital status: Married    Spouse name: N/A  . Number of children: 4   Occupational History  . Accounting   Social History Main Topics  . Smoking status: Never Smoker  . Smokeless tobacco: Never Used  . Alcohol use Yes     Comment: wine, nightly  . Drug use: No   Current Outpatient Medications on File Prior to Visit  Medication Sig Dispense  Refill  . Ascorbic Acid 500 MG/15ML SYRP Take by mouth.    . Cholecalciferol (VITAMIN D-3) 5000 units TABS Take 1 tablet by mouth daily.    . Dimethyl Fumarate (TECFIDERA) 240 MG CPDR Take 240 mg by mouth 2 (two) times daily.    . magnesium 30 MG tablet Take 30 mg by mouth 4 (four) times daily.     . NP THYROID 60 MG tablet Take 1 tablet (60 mg total) by mouth daily before breakfast. 90 tablet 3   Current Facility-Administered Medications on File Prior to Visit  Medication Dose Route Frequency Provider Last Rate Last Dose  . gadopentetate dimeglumine (MAGNEVIST) injection 13  mL  13 mL Intravenous Once PRN Sater, Richard A, MD      . gadopentetate dimeglumine (MAGNEVIST) injection 13 mL  13 mL Intravenous Once PRN Sater, Richard A, MD      . gadopentetate dimeglumine (MAGNEVIST) injection 13 mL  13 mL Intravenous Once PRN Sater, Nanine Means, MD       No Known Allergies Family History  Problem Relation Age of Onset  . Hypertension Mother   . Arthritis Mother   . Colon cancer Father   . Thyroid cancer Sister   . Dementia Brother        Early  . Heart attack Maternal Grandmother   . Hypertension Maternal Grandmother   . CAD Maternal Grandmother   . Diabetes Maternal Grandfather   . Lung cancer Maternal Grandfather   . Alzheimer's disease Paternal Grandmother   . Hypertension Paternal Grandmother   . Hyperlipidemia Paternal Grandmother   . Diabetes Paternal Grandfather   . Asthma Brother   . Thyroid cancer Brother    Also, DM in PGF; HL, heart ds in M.  PE: There were no vitals taken for this visit. Wt Readings from Last 3 Encounters:  02/12/17 144 lb 6.4 oz (65.5 kg)  11/07/16 143 lb (64.9 kg)  10/24/16 145 lb (65.8 kg)   Constitutional: Normal weight, in NAD Eyes: PERRLA, EOMI, no exophthalmos ENT: moist mucous membranes, no thyromegaly, no cervical lymphadenopathy Cardiovascular: RRR, No MRG Respiratory: CTA B Gastrointestinal: abdomen soft, NT, ND, BS+ Musculoskeletal: no deformities, strength intact in all 4 Skin: moist, warm, no rashes Neurological: no tremor with outstretched hands, DTR normal in all 4  ASSESSMENT: 1. Hypothyroidism  PLAN:  1. Patient with long-standing hypothyroidism, without evidence of Hashimoto's thyroiditis, on desiccated thyroid extract. - latest thyroid labs reviewed with pt >> normal in 10/2016 - she continues on NP thyroid 60 mg daily - pt feels good on this dose but continues to have occasional hot flashes (perimenopausal), occasional palpitations at night, and some fatigue - we discussed about taking the  thyroid hormone every day, with water, >30 minutes before breakfast, separated by >4 hours from acid reflux medications, calcium, iron, multivitamins. Pt. is taking it correctly. - will check thyroid tests today: TSH, fT3, and fT4 - If labs are abnormal, she will need to return for repeat TFTs in 1.5 months - At last visit, she was planning to stop biotin for MS (did not start yet), and we again discussed that she needs to be off the biotin for at least a week before thyroid testing in the future - I will see her back in 1 year, but I advised her to let me know if se develops hypothyroid sxs >> we may need to check labs sooner  - time spent with the patient: 15 min, of which >50% was spent in  obtaining information about her symptoms, reviewing her previous labs, evaluations, and treatments, counseling her about correct thyroid medication intake, and developing a plan to further investigate and treat her condition.  Needs refills at [facility] x 3 mo or local if changing dose.  Component     Latest Ref Rng & Units 05/07/2017  TSH     0.35 - 4.50 uIU/mL 0.70  T4,Free(Direct)     0.60 - 1.60 ng/dL 0.78  Triiodothyronine,Free,Serum     2.3 - 4.2 pg/mL 4.3 (H)   TFTs at goal >> continue same dose of NP thyroid.  Philemon Kingdom, MD PhD [facility] Endocrinology

## 2017-08-10 ENCOUNTER — Encounter: Payer: Self-pay | Admitting: Family Medicine

## 2017-08-10 ENCOUNTER — Encounter: Payer: Self-pay | Admitting: Neurology

## 2017-08-14 ENCOUNTER — Other Ambulatory Visit: Payer: Self-pay | Admitting: *Deleted

## 2017-08-14 DIAGNOSIS — G35 Multiple sclerosis: Secondary | ICD-10-CM

## 2017-08-14 DIAGNOSIS — R269 Unspecified abnormalities of gait and mobility: Secondary | ICD-10-CM

## 2017-08-14 DIAGNOSIS — R29898 Other symptoms and signs involving the musculoskeletal system: Secondary | ICD-10-CM

## 2017-08-15 ENCOUNTER — Encounter: Payer: 59 | Admitting: Family Medicine

## 2017-08-21 ENCOUNTER — Other Ambulatory Visit: Payer: Self-pay | Admitting: Neurology

## 2017-08-23 ENCOUNTER — Other Ambulatory Visit: Payer: Self-pay

## 2017-08-23 ENCOUNTER — Encounter: Payer: Self-pay | Admitting: Physical Therapy

## 2017-08-23 ENCOUNTER — Ambulatory Visit: Payer: 59 | Attending: Neurology | Admitting: Physical Therapy

## 2017-08-23 DIAGNOSIS — R2681 Unsteadiness on feet: Secondary | ICD-10-CM | POA: Diagnosis present

## 2017-08-23 DIAGNOSIS — R2689 Other abnormalities of gait and mobility: Secondary | ICD-10-CM | POA: Diagnosis present

## 2017-08-23 DIAGNOSIS — R42 Dizziness and giddiness: Secondary | ICD-10-CM | POA: Insufficient documentation

## 2017-08-23 DIAGNOSIS — M6281 Muscle weakness (generalized): Secondary | ICD-10-CM | POA: Diagnosis present

## 2017-08-23 DIAGNOSIS — M79601 Pain in right arm: Secondary | ICD-10-CM | POA: Diagnosis present

## 2017-08-24 NOTE — Therapy (Signed)
[facility] [address] [place], [place], [address] Phone: [phone]   Fax:  [phone]  Physical Therapy Evaluation  Patient Details  Name: Samantha Church MRN: [id number] Date of Birth: 1960/02/18 Referring Provider: Arlice Colt, MD   Encounter Date: 08/23/2017  PT End of Session - 08/23/17 1830    Visit Number  1    Number of Visits  17    Date for PT Re-Evaluation  10/19/17    Authorization Type  [employer] visit limit 23 Speciality Services required $30 co-pay    Authorization - Visit Number  1    Authorization - Number of Visits  23    PT Start Time  1015    PT Stop Time  1055    PT Time Calculation (min)  40 min    Equipment Utilized During Treatment  Gait belt    Activity Tolerance  Patient tolerated treatment well   PT performed eval tasks to limit fatigue with MS   Behavior During Therapy  WFL for tasks assessed/performed       Past Medical History:  Diagnosis Date  . Anemia   . Hypothyroidism    Dr. [name]  . Lyme disease   . Multiple sclerosis ([place])   . Neuromuscular disorder ([place])    MS  . Vision abnormalities     Past Surgical History:  Procedure Laterality Date  . BREAST SURGERY  2002  . COLONOSCOPY  2008  . DILATATION & CURRETTAGE/HYSTEROSCOPY WITH RESECTOCOPE N/A 10/09/2012   Procedure: DILATATION & CURETTAGE/HYSTEROSCOPY WITH RESECTOCOPE;  Surgeon: [name], MD;  Location: [facility];  Service: Gynecology;  Laterality: N/A;  RESECTION with Versapoint   . fibroid  2019   fibroidectomy  . KNEE ARTHROSCOPY  1996  . KNEE ARTHROSCOPY  1996    There were no vitals filed for this visit.   Subjective Assessment - 08/23/17 1023    Subjective  This 57yo female was referred by Arlice Colt, MD on 08/14/2017 to PT for evaluation with MS, gait disturbance & Left leg weakness. She reports weakness in left leg increased ~8 months ago & began using a cane 6 months ago. She also reports right arm  pain since use of cane.     Pertinent History  relapsing MS, hypothyroidism, L knee arthroscopy, Lyme disease    Limitations  Lifting;Standing;Walking;House hold activities    Patient Stated Goals  To build strength to walk without support and learn proper support if needed. Learned exercises    Currently in Pain?  Yes    Pain Score  4    in last week, stays at 3-4 /10   Pain Location  Arm    Pain Orientation  Right    Pain Descriptors / Indicators  Aching    Pain Type  Acute pain    Pain Onset  More than a month ago    Pain Frequency  Constant    Aggravating Factors   cane use, walking     Pain Relieving Factors  sitting with no cane use for 30 minutes         [facility] PT Assessment - 08/23/17 1015      Assessment   Medical Diagnosis  MS, left leg weakness    Referring Provider  Arlice Colt, MD    Onset Date/Surgical Date  08/14/17   MD referral to PT   Hand Dominance  Right    Prior Therapy  11 PT visits 10/22/2015-12/21/2015  Precautions   Precautions  Fall;Other (comment)   avoid fatigue & heat     Restrictions   Weight Bearing Restrictions  No      Balance Screen   Has the patient fallen in the past 6 months  No    Has the patient had a decrease in activity level because of a fear of falling?   No    Is the patient reluctant to leave their home because of a fear of falling?   No      Home Social worker  Private residence    Living Arrangements  Spouse/significant other;Children;Other relatives   adult son, father-law   Type of [place] Access  Level entry    [place]  Two level;Full bath on main level;Able to live on main level with bedroom/bathroom   upstairs playroom/media, extra bedrooms/bath   Alternate Level Stairs-Number of Steps  14   has chair lift   [place] - single point;Shower seat - built in;Hand held Advertising copywriter      Prior Function   Level of Independence  Independent;Independent  with household mobility with device;Independent with community mobility with device    Vocation  Part time employment    Herbalist, desk job    Leisure  walking, bowling, travel       Posture/Postural Control   Posture/Postural Control  No significant limitations      Tone   Assessment Location  Right Lower Extremity;Left Lower Extremity;Right Upper Extremity      ROM / Strength   AROM / PROM / Strength  AROM;Strength      AROM   Overall AROM   Within functional limits for tasks performed      Strength   Overall Strength  Deficits    Strength Assessment Site  Hip;Knee;Ankle    Right Hip Flexion  5/5    Right Hip Extension  4/5    Right Hip External Rotation   4/5    Right Hip Internal Rotation  4/5    Right Hip ABduction  4/5    Right Hip ADduction  4/5    Left Hip Flexion  4-/5    Left Hip Extension  3-/5    Left Hip External Rotation  3+/5    Left Hip Internal Rotation  3+/5    Left Hip ABduction  3+/5    Left Hip ADduction  3+/5    Right/Left Knee  Right;Left    Right Knee Flexion  4/5    Right Knee Extension  4+/5    Left Knee Flexion  3+/5    Left Knee Extension  4-/5    Right/Left Ankle  Right;Left    Right Ankle Dorsiflexion  4+/5    Right Ankle Plantar Flexion  4/5    Right Ankle Inversion  4/5    Right Ankle Eversion  4/5    Left Ankle Dorsiflexion  3-/5    Left Ankle Plantar Flexion  3-/5    Left Ankle Inversion  3-/5    Left Ankle Eversion  3-/5      Palpation   Palpation comment  muscle tightness with trigger points upper trapezuis & medical scapular muscles - BUEs with R>L      Transfers   Transfers  Sit to Stand;Stand to Sit    Sit to Stand  5: Supervision;With upper extremity assist;With armrests;From chair/3-in-1   uses back of legs against chair  to stabilize   Stand to Sit  5: Supervision;With upper extremity assist;With armrests;To chair/3-in-1      Ambulation/Gait   Ambulation/Gait  Yes    Ambulation/Gait Assistance   5: Supervision    Ambulation Distance (Feet)  200 Feet    Assistive device  Straight cane    Gait Pattern  Step-through pattern;Decreased arm swing - left;Decreased stride length;Decreased stance time - left;Decreased step length - right;Decreased hip/knee flexion - left;Decreased dorsiflexion - left;Left circumduction;Antalgic;Lateral hip instability;Trunk flexed;Abducted - left;Poor foot clearance - left    Ambulation Surface  Indoor;Level    Gait velocity  2.85 ft/sec    Stairs  Yes    Stairs Assistance  5: Supervision    Stair Management Technique  One rail Left;Step to pattern;Forwards    Number of Stairs  4      Standardized Balance Assessment   Standardized Balance Assessment  Berg Balance Test;Dynamic Gait Index      Berg Balance Test   Sit to Stand  Able to stand  independently using hands    Standing Unsupported  Able to stand safely 2 minutes    Sitting with Back Unsupported but Feet Supported on Floor or Stool  Able to sit safely and securely 2 minutes    Stand to Sit  Controls descent by using hands    Transfers  Able to transfer safely, definite need of hands    Standing Unsupported with Eyes Closed  Able to stand 10 seconds with supervision    Standing Ubsupported with Feet Together  Able to place feet together independently and stand for 1 minute with supervision    From Standing, Reach Forward with Outstretched Arm  Can reach forward >12 cm safely (5")    From Standing Position, Pick up Object from Floor  Able to pick up shoe, needs supervision    From Standing Position, Turn to Look Behind Over each Shoulder  Turn sideways only but maintains balance    Turn 360 Degrees  Able to turn 360 degrees safely but slowly    Standing Unsupported, Alternately Place Feet on Step/Stool  Able to complete >2 steps/needs minimal assist    Standing Unsupported, One Foot in Front  Able to take small step independently and hold 30 seconds    Standing on One Leg  Tries to lift leg/unable to  hold 3 seconds but remains standing independently    Total Score  37      Dynamic Gait Index   Level Surface  Mild Impairment    Change in Gait Speed  Moderate Impairment    Gait with Horizontal Head Turns  Mild Impairment    Gait with Vertical Head Turns  Moderate Impairment    Gait and Pivot Turn  Moderate Impairment    Step Over Obstacle  Moderate Impairment    Step Around Obstacles  Mild Impairment    Steps  Moderate Impairment    Total Score  11    DGI comment:  cane use      RUE Tone   RUE Tone  Within Functional Limits      RLE Tone   RLE Tone  Within Functional Limits      LLE Tone   LLE Tone  Within Functional Limits                Objective measurements completed on examination: See above findings.              PT Education - 08/23/17 1030  Education Details  MS society & MS foundation benefits     Person(s) Educated  Patient    Methods  Explanation    Comprehension  Verbalized understanding       PT Short Term Goals - 08/23/17 1800      PT SHORT TERM GOAL #1   Title  Patient verbalizes & demonstrates understanding of initial HEP. (All STGs Target Date: 09/21/2017)    Time  4    Period  Weeks    Status  New    Target Date  09/21/17      PT SHORT TERM GOAL #2   Title  Patient ambulates 500' with cane on indoor & paved surfaces with supervision.     Time  4    Period  Weeks    Status  New    Target Date  09/21/17      PT SHORT TERM GOAL #3   Title  Patient negotiates ramps & curbs with cane with supervision.     Time  4    Period  Weeks    Status  New    Target Date  09/21/17      PT SHORT TERM GOAL #4   Title  Berg Balance >/= 41/56    Time  4    Period  Weeks    Status  New    Target Date  09/21/17        PT Long Term Goals - 08/23/17 1800      PT LONG TERM GOAL #1   Title  Patient demonstrates & verbalizes understanding of ongoing HEP & fitness plan including community resources. (All LTGs Target Date: 10/19/2017)     Baseline        Time  8    Period  Weeks    Status  New    Target Date  10/19/17      PT LONG TERM GOAL #2   Title  Patient reports 50% improvement in right arm pain.     Baseline        Time  8    Period  Weeks    Status  New    Target Date  10/19/17      PT LONG TERM GOAL #3   Title  Patient ambulates 1000' with cane outdoors on paved & grass surfaces, ramps & curbs modified independent.     Baseline        Time  8    Period  Weeks    Status  New    Target Date  10/19/17      PT LONG TERM GOAL #4   Title  Patient ambulates around furniture simulating household gait without device modified independent.     Baseline        Time  8    Period  Weeks    Status  New    Target Date  10/19/17      PT LONG TERM GOAL #5   Title  Berg Balance >45/56 to indicate lower fall risk.     Baseline        Time  8    Period  Weeks    Status  New    Target Date  10/19/17      PT LONG TERM GOAL #6   Title  Dynamic Gait Index with cane >/= 18/24 to indicate lower fall risk.    Time  8    Period  Weeks    Status  New  Target Date  10/19/17             Plan - 08/23/17 1800    Clinical Impression Statement  This 57yo female has history of Multiple Sclerosis for 13-14 years with increased left lower extremity weakness over last 8 months. She began using cane 6 months ago & has right arm pain from use. Manual Muscle Test indicates increased weakness of left hip, knee & ankle. Berg Balance 37/56 indicates high fall risk. Dynamic Gait Index with cane use 11/24 also indicates high fall risk. She has unsteady gait with deviations indicating fall risk. Patient appears would benefit from skilled Physical Therapy.    History and Personal Factors relevant to plan of care:  relapsing MS, hypothyroidism, knee arthroscopy, Lyme disease    Clinical Presentation  Evolving    Clinical Presentation due to:  high fall risk, increased weakness with MS, introduction on cane with UE pain resulting     Clinical Decision Making  Moderate    Rehab Potential  Good    Clinical Impairments Affecting Rehab Potential  MS with fatigue limitations,     PT Frequency  2x / week    PT Duration  8 weeks    PT Treatment/Interventions  ADLs/Self Care Home Management;Canalith Repostioning;Ultrasound;DME Instruction;Gait training;Stair training;Functional mobility training;Therapeutic activities;Therapeutic exercise;Balance training;Neuromuscular re-education;Patient/family education;Manual techniques;Vestibular    PT Next Visit Plan  HEP for strength & balance, gait training with cane, manual techniques for RUE pain    Consulted and Agree with Plan of Care  Patient       Patient will benefit from skilled therapeutic intervention in order to improve the following deficits and impairments:  Abnormal gait, Decreased activity tolerance, Decreased balance, Decreased endurance, Decreased knowledge of use of DME, Decreased mobility, Decreased strength, Dizziness, Pain  Visit Diagnosis: Other abnormalities of gait and mobility  Dizziness and giddiness  Muscle weakness (generalized)  Unsteadiness on feet  Pain in right arm     Problem List Patient Active Problem List   Diagnosis Date Noted  . Establishing care with new doctor, encounter for 02/12/2017  . Lyme disease 11/03/2014  . Hypothyroidism (acquired) 11/03/2014  . Other fatigue 11/03/2014  . MULTIPLE SCLEROSIS 09/01/2008    Jamey Reas PT, DPT 08/24/2017, 11:53 AM  [facility] [address] [place], [place], [address] Phone: [phone]   Fax:  [phone]  Name: Samantha Church MRN: [id number] Date of Birth: [birth year]

## 2017-08-27 ENCOUNTER — Encounter: Payer: Self-pay | Admitting: Physical Therapy

## 2017-08-27 ENCOUNTER — Ambulatory Visit: Payer: 59 | Admitting: Physical Therapy

## 2017-08-27 DIAGNOSIS — R2689 Other abnormalities of gait and mobility: Secondary | ICD-10-CM | POA: Diagnosis not present

## 2017-08-27 DIAGNOSIS — M6281 Muscle weakness (generalized): Secondary | ICD-10-CM

## 2017-08-27 DIAGNOSIS — M79601 Pain in right arm: Secondary | ICD-10-CM

## 2017-08-27 DIAGNOSIS — R2681 Unsteadiness on feet: Secondary | ICD-10-CM

## 2017-08-27 NOTE — Therapy (Signed)
[facility] [address] [place], [place], [address] Phone: [phone]   Fax:  [phone]  Physical Therapy Treatment  Patient Details  Name: Samantha Church MRN: [id number] Date of Birth: [birth year] Referring Provider: Arlice Colt, MD   Encounter Date: 08/27/2017  Samantha Church End of Session - 08/27/17 1454    Visit Number  2    Number of Visits  17    Date for Samantha Church Re-Evaluation  10/19/17    Authorization Type  UHC visit limit 23 Speciality Services required $30 co-pay    Authorization - Visit Number  2    Authorization - Number of Visits  23    Samantha Church Start Time  1406    Samantha Church Stop Time  1450    Samantha Church Time Calculation (min)  44 min    Activity Tolerance  Patient tolerated treatment well   Samantha Church performed eval tasks to limit fatigue with MS   Behavior During Therapy  [facility] for tasks assessed/performed       Past Medical History:  Diagnosis Date  . Anemia   . Hypothyroidism    Dr. [name]  . Lyme disease   . Multiple sclerosis ([place])   . Neuromuscular disorder ([place])    MS  . Vision abnormalities     Past Surgical History:  Procedure Laterality Date  . BREAST SURGERY  2002  . COLONOSCOPY  2008  . DILATATION & CURRETTAGE/HYSTEROSCOPY WITH RESECTOCOPE N/A 10/09/2012   Procedure: DILATATION & CURETTAGE/HYSTEROSCOPY WITH RESECTOCOPE;  Surgeon: [name], MD;  Location: [facility];  Service: Gynecology;  Laterality: N/A;  RESECTION with Versapoint   . fibroid  2019   fibroidectomy  . KNEE ARTHROSCOPY  1996  . KNEE ARTHROSCOPY  1996    There were no vitals filed for this visit.  Subjective Assessment - 08/27/17 1410    Subjective  No issues to report; RUE still sore in elbow and shoulder.      Pertinent History  relapsing MS, hypothyroidism, L knee arthroscopy, Lyme disease    Limitations  Lifting;Standing;Walking;House hold activities    Patient Stated Goals  To build strength to walk without support and learn proper support  if needed. Learned exercises    Currently in Pain?  Yes    Pain Score  4     Pain Location  Shoulder    Pain Orientation  Right    Pain Descriptors / Indicators  Sore    Pain Type  Chronic pain    Pain Onset  More than a month ago                       [facility] Adult Samantha Church Treatment/Exercise - 08/27/17 1449      Ambulation/Gait   Ambulation/Gait  Yes    Ambulation/Gait Assistance  5: Supervision    Ambulation/Gait Assistance Details  first with cane to assess posture during gait; Samantha Church noted to have mild R shoulder elevation and trunk elongation on R side to keep weight shifted over RLE when ambulating with cane.  Performed trial of using bilat trekking poles for ambulation to improve trunk symmetry and UE extension activation and shoulder depression.    Ambulation Distance (Feet)  230 Feet   x 2   Assistive device  Straight cane;Other (Comment)   bilat trekking poles   Ambulation Surface  Level;Indoor      Balance   Balance Assessed  Yes      High Level Balance   High  Level Balance Comments  Standing on RLE Samantha Church able to maintain trunk in midline x 10 seconds; standing on LLE Samantha Church demonstrates significant R lateral lean to compensate for hip abduction weakness closed chain      Therapeutic Activites    Therapeutic Activities  --        Access Code: [email or web address]  URL: [email or web address]  Date: 08/27/2017  Prepared by: Misty Stanley   Exercises  Sidelying Diagonal Hip Abduction - 10 reps - 2 sets - 1x daily - 5x weekly  Side Plank on Knees - 10 reps - 2 sets - 1x daily - 5x weekly  Quadruped Fire Hydrant - 10 reps - 2 sets - 1x daily - 5x weekly        Samantha Church Education - 08/27/17 1449    Education Details  Gait with trekking poles and lateral hip and core strengthening    Person(s) Educated  Patient    Methods  Explanation;Demonstration;Handout    Comprehension  Verbalized understanding;Returned demonstration       Samantha Church Short Term Goals - 08/23/17 1800       Samantha Church SHORT TERM GOAL #1   Title  Patient verbalizes & demonstrates understanding of initial HEP. (All STGs Target Date: 09/21/2017)    Time  4    Period  Weeks    Status  New    Target Date  09/21/17      Samantha Church SHORT TERM GOAL #2   Title  Patient ambulates 500' with cane on indoor & paved surfaces with supervision.     Time  4    Period  Weeks    Status  New    Target Date  09/21/17      Samantha Church SHORT TERM GOAL #3   Title  Patient negotiates ramps & curbs with cane with supervision.     Time  4    Period  Weeks    Status  New    Target Date  09/21/17      Samantha Church SHORT TERM GOAL #4   Title  Berg Balance >/= 41/56    Time  4    Period  Weeks    Status  New    Target Date  09/21/17        Samantha Church Long Term Goals - 08/23/17 1800      Samantha Church LONG TERM GOAL #1   Title  Patient demonstrates & verbalizes understanding of ongoing HEP & fitness plan including community resources. (All LTGs Target Date: 10/19/2017)    Baseline        Time  8    Period  Weeks    Status  New    Target Date  10/19/17      Samantha Church LONG TERM GOAL #2   Title  Patient reports 50% improvement in right arm pain.     Baseline        Time  8    Period  Weeks    Status  New    Target Date  10/19/17      Samantha Church LONG TERM GOAL #3   Title  Patient ambulates 1000' with cane outdoors on paved & grass surfaces, ramps & curbs modified independent.     Baseline        Time  8    Period  Weeks    Status  New    Target Date  10/19/17      Samantha Church LONG TERM GOAL #4   Title  Patient ambulates around furniture simulating  household gait without device modified independent.     Baseline        Time  8    Period  Weeks    Status  New    Target Date  10/19/17      Samantha Church LONG TERM GOAL #5   Title  Berg Balance >45/56 to indicate lower fall risk.     Baseline        Time  8    Period  Weeks    Status  New    Target Date  10/19/17      Samantha Church LONG TERM GOAL #6   Title  Dynamic Gait Index with cane >/= 18/24 to indicate lower fall risk.     Time  8    Period  Weeks    Status  New    Target Date  10/19/17            Plan - 08/27/17 1454    Clinical Impression Statement  Treatment session focused on further assessment of proximal hip and core strength and postural control during single limb stance and posture/weight shifting pattern during gait.  Initiated shoulder, proximal hip and core strengthening HEP in sidelying and quadruped and gait training with use of trekking poles.  With trekking poles Samantha Church demonstrated more symmetrical posture, increased trunk rotation and reported decreased pain in R elbow.  Will continue to progress towards LTG.    Rehab Potential  Good    Clinical Impairments Affecting Rehab Potential  MS with fatigue limitations,     Samantha Church Frequency  2x / week    Samantha Church Duration  8 weeks    Samantha Church Treatment/Interventions  ADLs/Self Care Home Management;Canalith Repostioning;Ultrasound;DME Instruction;Gait training;Stair training;Functional mobility training;Therapeutic activities;Therapeutic exercise;Balance training;Neuromuscular re-education;Patient/family education;Manual techniques;Vestibular    Samantha Church Next Visit Plan  progress HEP - add scapular stabilization exercises; L closed chain hip ABD strengthening (tall kneeling/quadruped); gait with trekking poles for more symmetrical trunk and weight shift.      Samantha Church Home Exercise Plan  [email or web address]     Consulted and Agree with Plan of Care  Patient       Patient will benefit from skilled therapeutic intervention in order to improve the following deficits and impairments:  Abnormal gait, Decreased activity tolerance, Decreased balance, Decreased endurance, Decreased knowledge of use of DME, Decreased mobility, Decreased strength, Dizziness, Pain  Visit Diagnosis: Other abnormalities of gait and mobility  Muscle weakness (generalized)  Pain in right arm  Unsteadiness on feet     Problem List Patient Active Problem List   Diagnosis Date Noted  . Establishing care with new  doctor, encounter for 02/12/2017  . Lyme disease 11/03/2014  . Hypothyroidism (acquired) 11/03/2014  . Other fatigue 11/03/2014  . MULTIPLE SCLEROSIS 09/01/2008    Samantha Church, Samantha Church, Samantha Church 08/27/17    3:00 PM    [facility] [address] [place], [place], [address] Phone: [phone]   Fax:  [phone]  Name: Samantha Church MRN: [id number] Date of Birth: 09-30-1960

## 2017-08-27 NOTE — Patient Instructions (Signed)
Access Code: [email or web address]  URL: [email or web address]  Date: 08/27/2017  Prepared by: Misty Stanley   Exercises  Sidelying Diagonal Hip Abduction - 10 reps - 2 sets - 1x daily - 5x weekly  Side Plank on Knees - 10 reps - 2 sets - 1x daily - 5x weekly  Quadruped Fire Hydrant - 10 reps - 2 sets - 1x daily - 5x weekly

## 2017-08-29 ENCOUNTER — Encounter: Payer: Self-pay | Admitting: Physical Therapy

## 2017-08-29 ENCOUNTER — Ambulatory Visit: Payer: 59 | Admitting: Physical Therapy

## 2017-08-29 DIAGNOSIS — R2689 Other abnormalities of gait and mobility: Secondary | ICD-10-CM

## 2017-08-29 DIAGNOSIS — M6281 Muscle weakness (generalized): Secondary | ICD-10-CM

## 2017-08-29 DIAGNOSIS — M79601 Pain in right arm: Secondary | ICD-10-CM

## 2017-08-29 NOTE — Patient Instructions (Signed)
Access Code: [email or web address]  URL: [email or web address]  Date: 08/29/2017  Prepared by: Misty Stanley   Exercises  Sidelying Diagonal Hip Abduction - 10 reps - 2 sets - 1x daily - 5x weekly  Side Plank on Knees - 10 reps - 2 sets - 1x daily - 5x weekly  Quadruped Fire Hydrant - 10 reps - 2 sets - 1x daily - 5x weekly  Seated Upper Trapezius Stretch - 2 sets - 30 second hold - 2x daily - 7x weekly  Supine Resisted Serratus Anterior Punches with Elbow Bent - 10 reps - 2 sets - 1x daily - 7x weekly  Corner Pec Minor Stretch - 3 sets - 30 seconds hold - 2x daily - 7x weekly

## 2017-08-29 NOTE — Therapy (Signed)
[facility] [address] [place], [place], [address] Phone: [phone]   Fax:  [phone]  Physical Therapy Treatment  Patient Details  Name: Samantha Church MRN: [id number] Date of Birth: [birth year] Referring Provider: Arlice Colt, MD   Encounter Date: 08/29/2017  PT End of Session - 08/29/17 1302    Visit Number  3    Number of Visits  17    Date for PT Re-Evaluation  10/19/17    Authorization Type  [employer] visit limit 23 Speciality Services required $30 co-pay    Authorization - Visit Number  3    Authorization - Number of Visits  23    PT Start Time  1152    PT Stop Time  1240    PT Time Calculation (min)  48 min    Activity Tolerance  Patient tolerated treatment well   PT performed eval tasks to limit fatigue with MS   Behavior During Therapy  [facility] for tasks assessed/performed       Past Medical History:  Diagnosis Date  . Anemia   . Hypothyroidism    Dr. [name]  . Lyme disease   . Multiple sclerosis ([place])   . Neuromuscular disorder ([place])    MS  . Vision abnormalities     Past Surgical History:  Procedure Laterality Date  . BREAST SURGERY  2002  . COLONOSCOPY  2008  . DILATATION & CURRETTAGE/HYSTEROSCOPY WITH RESECTOCOPE N/A 10/09/2012   Procedure: DILATATION & CURETTAGE/HYSTEROSCOPY WITH RESECTOCOPE;  Surgeon: [name], MD;  Location: [facility];  Service: Gynecology;  Laterality: N/A;  RESECTION with Versapoint   . fibroid  2019   fibroidectomy  . KNEE ARTHROSCOPY  1996  . KNEE ARTHROSCOPY  1996    There were no vitals filed for this visit.  Subjective Assessment - 08/29/17 1154    Subjective  RUE pain is down to a 3/10; went out and bought trekking poles and used them to walk down the driveway and around the house.  Feels more stable with them.    Pertinent History  relapsing MS, hypothyroidism, L knee arthroscopy, Lyme disease    Limitations  Lifting;Standing;Walking;House hold  activities    Patient Stated Goals  To build strength to walk without support and learn proper support if needed. Learned exercises    Currently in Pain?  Yes    Pain Score  3     Pain Location  Shoulder    Pain Orientation  Right    Pain Descriptors / Indicators  Sore    Pain Onset  More than a month ago                       [facility] Adult PT Treatment/Exercise - 08/29/17 1254      Therapeutic Activites    Therapeutic Activities  Work [employer]    Work [employer]  discussed work set up as pt sits at a computer most of the day.  Pt reports she has raised her monitor up to eye level but UE are positioned above 90 deg.  Advised pt raise work chair so shoulders are relaxed and elbows are at 90 deg.  If feet dangle - recommend pt place small stool under feet for support.        Exercises   Exercises  Shoulder      Shoulder Exercises: Supine   Protraction  Strengthening;Right;10 reps;Theraband    Theraband Level (Shoulder Protraction)  Other (comment)   black  level 5   Protraction Limitations  for serratus anterior activation training without use of pec minor; unable to elicit contraction in sitting or standing in closed chain due to overactive pectoralis activation      Shoulder Exercises: IT sales professional  Other (comment)    Corner Stretch Limitations  with UE at 0 deg flexion for pec minor stretch    Other Shoulder Stretches  seated upper trapezius stretch x 2 reps each side x 30 seconds        Access Code: [email or web address]  URL: [email or web address]  Date: 08/29/2017  Prepared by: Misty Stanley   Exercises  Sidelying Diagonal Hip Abduction - 10 reps - 2 sets - 1x daily - 5x weekly  Side Plank on Knees - 10 reps - 2 sets - 1x daily - 5x weekly  Quadruped Fire Hydrant - 10 reps - 2 sets - 1x daily - 5x weekly  Seated Upper Trapezius Stretch - 2 sets - 30 second hold - 2x daily - 7x weekly  Supine Resisted Serratus Anterior Punches with Elbow Bent -  10 reps - 2 sets - 1x daily - 7x weekly  Corner Pec Minor Stretch - 3 sets - 30 seconds hold - 2x daily - 7x weekly        PT Education - 08/29/17 1300    Education Details  updated HEP to include neck stretches and serratus anterior strengthening for scapular stability    Person(s) Educated  Patient    Methods  Explanation;Demonstration;Handout    Comprehension  Verbalized understanding;Returned demonstration       PT Short Term Goals - 08/23/17 1800      PT SHORT TERM GOAL #1   Title  Patient verbalizes & demonstrates understanding of initial HEP. (All STGs Target Date: 09/21/2017)    Time  4    Period  Weeks    Status  New    Target Date  09/21/17      PT SHORT TERM GOAL #2   Title  Patient ambulates 500' with cane on indoor & paved surfaces with supervision.     Time  4    Period  Weeks    Status  New    Target Date  09/21/17      PT SHORT TERM GOAL #3   Title  Patient negotiates ramps & curbs with cane with supervision.     Time  4    Period  Weeks    Status  New    Target Date  09/21/17      PT SHORT TERM GOAL #4   Title  Berg Balance >/= 41/56    Time  4    Period  Weeks    Status  New    Target Date  09/21/17        PT Long Term Goals - 08/23/17 1800      PT LONG TERM GOAL #1   Title  Patient demonstrates & verbalizes understanding of ongoing HEP & fitness plan including community resources. (All LTGs Target Date: 10/19/2017)    Baseline        Time  8    Period  Weeks    Status  New    Target Date  10/19/17      PT LONG TERM GOAL #2   Title  Patient reports 50% improvement in right arm pain.     Baseline        Time  8    Period  Weeks  Status  New    Target Date  10/19/17      PT LONG TERM GOAL #3   Title  Patient ambulates 1000' with cane outdoors on paved & grass surfaces, ramps & curbs modified independent.     Baseline        Time  8    Period  Weeks    Status  New    Target Date  10/19/17      PT LONG TERM GOAL #4   Title   Patient ambulates around furniture simulating household gait without device modified independent.     Baseline        Time  8    Period  Weeks    Status  New    Target Date  10/19/17      PT LONG TERM GOAL #5   Title  Berg Balance >45/56 to indicate lower fall risk.     Baseline        Time  8    Period  Weeks    Status  New    Target Date  10/19/17      PT LONG TERM GOAL #6   Title  Dynamic Gait Index with cane >/= 18/24 to indicate lower fall risk.    Time  8    Period  Weeks    Status  New    Target Date  10/19/17            Plan - 08/29/17 1302    Clinical Impression Statement  Treatment session today focused on discussion of patient's work set up to decrease overuse of upper trapezius mm; also educated pt on stretches for upper trapezius and pectoralis minor to improve ROM and improve positioning of the shoulder.  Rest of session focused on most appropriate position to strengthen serratus anterior without activation of pec minor.  Unable to perform in sitting or standing in closed chain; able to isolate serratus in supine with theraband resistance.  Will continue to address to progress towards LTG.    Rehab Potential  Good    Clinical Impairments Affecting Rehab Potential  MS with fatigue limitations,     PT Frequency  2x / week    PT Duration  8 weeks    PT Treatment/Interventions  ADLs/Self Care Home Management;Canalith Repostioning;Ultrasound;DME Instruction;Gait training;Stair training;Functional mobility training;Therapeutic activities;Therapeutic exercise;Balance training;Neuromuscular re-education;Patient/family education;Manual techniques;Vestibular    PT Next Visit Plan  continue scapular stability -especially serratus anterior training progress to closed chain.  L closed chain hip ABD strengthening (tall kneeling/quadruped); gait with trekking poles for more symmetrical trunk and weight shift.      PT Home Exercise Plan  4CMFLLAR     Consulted and Agree with Plan  of Care  Patient       Patient will benefit from skilled therapeutic intervention in order to improve the following deficits and impairments:  Abnormal gait, Decreased activity tolerance, Decreased balance, Decreased endurance, Decreased knowledge of use of DME, Decreased mobility, Decreased strength, Dizziness, Pain  Visit Diagnosis: Other abnormalities of gait and mobility  Muscle weakness (generalized)  Pain in right arm     Problem List Patient Active Problem List   Diagnosis Date Noted  . Establishing care with new doctor, encounter for 02/12/2017  . Lyme disease 11/03/2014  . Hypothyroidism (acquired) 11/03/2014  . Other fatigue 11/03/2014  . MULTIPLE SCLEROSIS 09/01/2008    Rico Junker, PT, DPT 08/29/17    1:08 PM    [facility]  [place], [place], [address] Phone: [phone]   Fax:  [phone]  Name: TWYLAH BENNETTS MRN: [id number] Date of Birth: 20-Dec-1960

## 2017-09-06 ENCOUNTER — Ambulatory Visit: Payer: 59 | Admitting: Rehabilitation

## 2017-09-06 ENCOUNTER — Encounter: Payer: Self-pay | Admitting: Rehabilitation

## 2017-09-06 DIAGNOSIS — R2689 Other abnormalities of gait and mobility: Secondary | ICD-10-CM | POA: Diagnosis not present

## 2017-09-06 DIAGNOSIS — R2681 Unsteadiness on feet: Secondary | ICD-10-CM

## 2017-09-06 DIAGNOSIS — M6281 Muscle weakness (generalized): Secondary | ICD-10-CM

## 2017-09-06 NOTE — Therapy (Signed)
[facility] [address] [place], [place], [address] Phone: [phone]   Fax:  [phone]  Physical Therapy Treatment  Patient Details  Name: Samantha Church MRN: [id number] Date of Birth: 25-Dec-1960 Referring Provider: Arlice Colt, MD   Encounter Date: 09/06/2017  PT End of Session - 09/06/17 1259    Visit Number  4    Number of Visits  17    Date for PT Re-Evaluation  10/19/17    Authorization Type  UHC visit limit 23 Speciality Services required $30 co-pay    Authorization - Visit Number  4    Authorization - Number of Visits  23    PT Start Time  1103    PT Stop Time  1150    PT Time Calculation (min)  47 min    Activity Tolerance  Patient tolerated treatment well   PT performed eval tasks to limit fatigue with MS   Behavior During Therapy  [facility] for tasks assessed/performed       Past Medical History:  Diagnosis Date  . Anemia   . Hypothyroidism    Dr. [name]  . Lyme disease   . Multiple sclerosis ([place])   . Neuromuscular disorder ([place])    MS  . Vision abnormalities     Past Surgical History:  Procedure Laterality Date  . BREAST SURGERY  2002  . COLONOSCOPY  2008  . DILATATION & CURRETTAGE/HYSTEROSCOPY WITH RESECTOCOPE N/A 10/09/2012   Procedure: DILATATION & CURETTAGE/HYSTEROSCOPY WITH RESECTOCOPE;  Surgeon: [name], MD;  Location: [facility];  Service: Gynecology;  Laterality: N/A;  RESECTION with Versapoint   . fibroid  2019   fibroidectomy  . KNEE ARTHROSCOPY  1996  . KNEE ARTHROSCOPY  1996    There were no vitals filed for this visit.  Subjective Assessment - 09/06/17 1105    Subjective  R elbow pain still there but more pin point now vs last session.     Pertinent History  relapsing MS, hypothyroidism, L knee arthroscopy, Lyme disease    Limitations  Lifting;Standing;Walking;House hold activities    Patient Stated Goals  To build strength to walk without support and learn proper  support if needed. Learned exercises    Currently in Pain?  Yes    Pain Score  3     Pain Location  Elbow    Pain Orientation  Right    Pain Descriptors / Indicators  Sore    Pain Type  Chronic pain    Pain Onset  More than a month ago    Pain Frequency  Constant    Aggravating Factors   walking     Pain Relieving Factors  not putting weight on it.                        [facility] Adult PT Treatment/Exercise - 09/06/17 1118      Ambulation/Gait   Ambulation/Gait  Yes    Ambulation/Gait Assistance  5: Supervision    Ambulation/Gait Assistance Details  Provided pt with clinic trekking pole in order to use bilaterally.  Note marked improvemnent in fluidity of gait and improved L lateral weight shift vs when PT assessed with single walking pole in R hand.  Note slight increased R weight shift/trunk lean.  Therefore did have her place single pole in L hand to see if this would assist in more L lateral weight shift, however gait pattern was more unsteady.  Continue to recommend B  walking poles at this time.     Ambulation Distance (Feet)  300 Feet    Assistive device  --   B and single trekking poles   Gait Pattern  Step-through pattern;Decreased arm swing - left;Decreased stride length;Decreased stance time - left;Decreased step length - right;Decreased hip/knee flexion - left;Decreased dorsiflexion - left;Left circumduction;Antalgic;Lateral hip instability;Trunk flexed;Abducted - left;Poor foot clearance - left    Ambulation Surface  Level;Indoor    Stairs  Yes    Stairs Assistance  5: Supervision    Stairs Assistance Details (indicate cue type and reason)  Had pt perform stairs during session for improved proximal activation on RLE  when both ascending and descending.  She is able to perform without UE support (did have her hover UEs over rails when descending for safety).   Pt initially more uncontrolled during descent, but when cues for improved weight shift, she is able to  correct.     Stair Management Technique  No rails;Alternating pattern;Forwards    Number of Stairs  12    Height of Stairs  6      Self-Care   Self-Care  Other Self-Care Comments    Other Self-Care Comments   Discussed how fatigue and weakness can increase L knee hyperextension and poor foot clearance.  Pt reports she does note this more with fatigue.  Will continue to monitor and discuss bracing options as needed. Pt verbalized understanding.       Neuro Re-ed    Neuro Re-ed Details   NMR for L proximal hip motor control in forced use tasks; tall kneeling/half kneeling transitions x 2 sets of 10 reps (first with support from [facility] bench and then with single UE on physioball to derease UE support), maintaining R half kneel x 3 sets of 15 secs progressing to having her lift L UE from kaye bench x 5 reps>lifting physioball with BUEs and lowering x 10 reps.  Tactile cues at L hip for improved hip protraction, causing  pt to lose balance (able to recover with min a).  Stairs as stated above, also performing R hip drop with LLE on 6" step elevating back to neutral pelvis for closed chain hip abd x 10 reps with BUE support , sit<>stand from 15" step with emphasis on equal WB.  She continued to have slight R lateral weight shift, despite cues, therefore placed 2" step under RLE with improvement noted.  Also emphasis on conrolled descent with equal WB which did better with.  Progressed to wall squats x 10 reps, again with emphasis on equal WB with min cues throughout.  Ended session in // bars with LLE on foam airex tapping RLE to cone x 10 reps wth decreasing amount of UE support (finally placing RUE on therapist) with tactile and verbal cues for improved L hip protraction and forward weight shift.  Note that L knee at times will hyperextend, but feel that it is likely due to her not fully shifting forward onto LLE but likely also due to weakness as well.  Will continue to monitor.               PT  Education - 09/06/17 1259    Education Details  purpose of NMR tasks    Person(s) Educated  Patient    Methods  Explanation    Comprehension  Verbalized understanding       PT Short Term Goals - 08/23/17 1800      PT SHORT TERM GOAL #1  Title  Patient verbalizes & demonstrates understanding of initial HEP. (All STGs Target Date: 09/21/2017)    Time  4    Period  Weeks    Status  New    Target Date  09/21/17      PT SHORT TERM GOAL #2   Title  Patient ambulates 500' with cane on indoor & paved surfaces with supervision.     Time  4    Period  Weeks    Status  New    Target Date  09/21/17      PT SHORT TERM GOAL #3   Title  Patient negotiates ramps & curbs with cane with supervision.     Time  4    Period  Weeks    Status  New    Target Date  09/21/17      PT SHORT TERM GOAL #4   Title  Berg Balance >/= 41/56    Time  4    Period  Weeks    Status  New    Target Date  09/21/17        PT Long Term Goals - 08/23/17 1800      PT LONG TERM GOAL #1   Title  Patient demonstrates & verbalizes understanding of ongoing HEP & fitness plan including community resources. (All LTGs Target Date: 10/19/2017)    Baseline        Time  8    Period  Weeks    Status  New    Target Date  10/19/17      PT LONG TERM GOAL #2   Title  Patient reports 50% improvement in right arm pain.     Baseline        Time  8    Period  Weeks    Status  New    Target Date  10/19/17      PT LONG TERM GOAL #3   Title  Patient ambulates 1000' with cane outdoors on paved & grass surfaces, ramps & curbs modified independent.     Baseline        Time  8    Period  Weeks    Status  New    Target Date  10/19/17      PT LONG TERM GOAL #4   Title  Patient ambulates around furniture simulating household gait without device modified independent.     Baseline        Time  8    Period  Weeks    Status  New    Target Date  10/19/17      PT LONG TERM GOAL #5   Title  Berg Balance >45/56 to indicate  lower fall risk.     Baseline        Time  8    Period  Weeks    Status  New    Target Date  10/19/17      PT LONG TERM GOAL #6   Title  Dynamic Gait Index with cane >/= 18/24 to indicate lower fall risk.    Time  8    Period  Weeks    Status  New    Target Date  10/19/17            Plan - 09/06/17 1300    Clinical Impression Statement  Skilled session focused on NMR for LLE during forced use tasks for improved proximal motor control.  Pt tolerated very well, but did note some L knee  hyperextension near end of session.  Pt may be able to get by with wedge, but will continue to monitor for other needs.     Rehab Potential  Good    Clinical Impairments Affecting Rehab Potential  MS with fatigue limitations,     PT Frequency  2x / week    PT Duration  8 weeks    PT Treatment/Interventions  ADLs/Self Care Home Management;Canalith Repostioning;Ultrasound;DME Instruction;Gait training;Stair training;Functional mobility training;Therapeutic activities;Therapeutic exercise;Balance training;Neuromuscular re-education;Patient/family education;Manual techniques;Vestibular    PT Next Visit Plan  continue scapular stability -especially serratus anterior training progress to closed chain.  L closed chain hip ABD strengthening (tall kneeling/quadruped); gait with trekking poles for more symmetrical trunk and weight shift.  Keep an eye on L knee control and foot clearance     PT Home Exercise Plan  4CMFLLAR     Recommended Other Services  Lynn-check to see if new insurance has kicked in (note states benefit period ends 8/31 so her visit limit would start over if that's the case)    Consulted and Agree with Plan of Care  Patient       Patient will benefit from skilled therapeutic intervention in order to improve the following deficits and impairments:  Abnormal gait, Decreased activity tolerance, Decreased balance, Decreased endurance, Decreased knowledge of use of DME, Decreased mobility,  Decreased strength, Dizziness, Pain  Visit Diagnosis: Other abnormalities of gait and mobility  Muscle weakness (generalized)  Unsteadiness on feet     Problem List Patient Active Problem List   Diagnosis Date Noted  . Establishing care with new doctor, encounter for 02/12/2017  . Lyme disease 11/03/2014  . Hypothyroidism (acquired) 11/03/2014  . Other fatigue 11/03/2014  . MULTIPLE SCLEROSIS 09/01/2008    Cameron Sprang, PT, MPT [facility] [address] [place], [place], [address] Phone: [phone]   Fax:  [phone] 09/06/17, 1:06 PM  Name: Samantha Church MRN: [id number] Date of Birth: 1960-04-28

## 2017-09-11 ENCOUNTER — Encounter

## 2017-09-13 ENCOUNTER — Ambulatory Visit: Payer: No Typology Code available for payment source | Attending: Neurology | Admitting: Physical Therapy

## 2017-09-13 ENCOUNTER — Encounter: Payer: Self-pay | Admitting: Physical Therapy

## 2017-09-13 DIAGNOSIS — R2689 Other abnormalities of gait and mobility: Secondary | ICD-10-CM | POA: Insufficient documentation

## 2017-09-13 DIAGNOSIS — M79601 Pain in right arm: Secondary | ICD-10-CM | POA: Diagnosis present

## 2017-09-13 DIAGNOSIS — R42 Dizziness and giddiness: Secondary | ICD-10-CM | POA: Diagnosis present

## 2017-09-13 DIAGNOSIS — M6281 Muscle weakness (generalized): Secondary | ICD-10-CM | POA: Insufficient documentation

## 2017-09-13 DIAGNOSIS — R2681 Unsteadiness on feet: Secondary | ICD-10-CM | POA: Diagnosis present

## 2017-09-13 NOTE — Therapy (Signed)
[facility] [address] [place], [place], [address] Phone: [phone]   Fax:  [phone]  Physical Therapy Treatment  Patient Details  Name: Samantha Church MRN: [id number] Date of Birth: [birth year] Referring Provider: Arlice Colt, MD   Encounter Date: 09/13/2017  PT End of Session - 09/13/17 1310    Visit Number  5    Number of Visits  17    Date for PT Re-Evaluation  10/19/17    Authorization Type  [employer] visit limit 23 Speciality Services required $30 co-pay    Authorization - Visit Number  1   authorization period renewed 09/09/17 per pt   Authorization - Number of Visits  23    PT Start Time  0806   late arrival and to restroom   PT Stop Time  0847    PT Time Calculation (min)  41 min    Activity Tolerance  Patient tolerated treatment well    Behavior During Therapy  [facility] for tasks assessed/performed       Past Medical History:  Diagnosis Date  . Anemia   . Hypothyroidism    Dr. [name]  . Lyme disease   . Multiple sclerosis ([place])   . Neuromuscular disorder ([place])    MS  . Vision abnormalities     Past Surgical History:  Procedure Laterality Date  . BREAST SURGERY  2002  . COLONOSCOPY  2008  . DILATATION & CURRETTAGE/HYSTEROSCOPY WITH RESECTOCOPE N/A 10/09/2012   Procedure: DILATATION & CURETTAGE/HYSTEROSCOPY WITH RESECTOCOPE;  Surgeon: [name], MD;  Location: [facility];  Service: Gynecology;  Laterality: N/A;  RESECTION with Versapoint   . fibroid  2019   fibroidectomy  . KNEE ARTHROSCOPY  1996  . KNEE ARTHROSCOPY  1996    There were no vitals filed for this visit.  Subjective Assessment - 09/13/17 0807    Subjective  I'm going to try to do one-on-one training with a person that is also a therapist. She is a member of [employer] and there is a fitness room there.     Pertinent History  relapsing MS, hypothyroidism, L knee arthroscopy, Lyme disease    Limitations   Lifting;Standing;Walking;House hold activities    Patient Stated Goals  To build strength to walk without support and learn proper support if needed. Learned exercises    Currently in Pain?  Yes    Pain Score  2     Pain Location  Elbow    Pain Orientation  Right    Pain Descriptors / Indicators  Sore    Pain Type  Chronic pain    Pain Onset  More than a month ago    Pain Frequency  Constant    Aggravating Factors   walking with walking sticks    Pain Relieving Factors  not putting weight on it                       [facility] Adult PT Treatment/Exercise - 09/13/17 0828      Exercises   Exercises  Other Exercises;Knee/Hip    Other Exercises   tall kneeling holding green physioball, lower hips laterally toward side sititng and back to center, alternaing sides for closed chain hip abduction x 15 reps each side; 1/2 kneeling raising green physioball overhead x 10 reps with each leg fwd--vc for maintaining stance leg hip extension and forward foot/ankle in neutral (tends to invert)      Knee/Hip Exercises: Aerobic  Elliptical  educated in getting on/off equipment; 2 different hand holds (prefer vertical grips to incr balance challenge; going forwards and backwards; total of 4 minutes      Knee/Hip Exercises: Machines for Strengthening   Total Gym Leg Press  80# w/ 5 second holds x 10 reps bil LEs; 50# x 5 sec hold x 10 reps RLE;        Gait training- with bil walking poles; emphasis on engaging her core to reduce excessive lumbar lordosis, bringing her into slight forward posture to increase ability to focus on hip extension x 400 ft with no knee hyperextension noted      PT Education - 09/13/17 1314    Education Details  if she can take pictures of equipment at the fitness center, we can better instruct her in approp exercises for her; educated in use of leg press and elliptical    Person(s) Educated  Patient    Methods  Explanation;Demonstration;Verbal cues     Comprehension  Verbalized understanding;Returned demonstration;Verbal cues required       PT Short Term Goals - 08/23/17 1800      PT SHORT TERM GOAL #1   Title  Patient verbalizes & demonstrates understanding of initial HEP. (All STGs Target Date: 09/21/2017)    Time  4    Period  Weeks    Status  New    Target Date  09/21/17      PT SHORT TERM GOAL #2   Title  Patient ambulates 500' with cane on indoor & paved surfaces with supervision.     Time  4    Period  Weeks    Status  New    Target Date  09/21/17      PT SHORT TERM GOAL #3   Title  Patient negotiates ramps & curbs with cane with supervision.     Time  4    Period  Weeks    Status  New    Target Date  09/21/17      PT SHORT TERM GOAL #4   Title  Berg Balance >/= 41/56    Time  4    Period  Weeks    Status  New    Target Date  09/21/17        PT Long Term Goals - 08/23/17 1800      PT LONG TERM GOAL #1   Title  Patient demonstrates & verbalizes understanding of ongoing HEP & fitness plan including community resources. (All LTGs Target Date: 10/19/2017)    Baseline        Time  8    Period  Weeks    Status  New    Target Date  10/19/17      PT LONG TERM GOAL #2   Title  Patient reports 50% improvement in right arm pain.     Baseline        Time  8    Period  Weeks    Status  New    Target Date  10/19/17      PT LONG TERM GOAL #3   Title  Patient ambulates 1000' with cane outdoors on paved & grass surfaces, ramps & curbs modified independent.     Baseline        Time  8    Period  Weeks    Status  New    Target Date  10/19/17      PT LONG TERM GOAL #4   Title  Patient ambulates around furniture simulating household gait without device modified independent.     Baseline        Time  8    Period  Weeks    Status  New    Target Date  10/19/17      PT LONG TERM GOAL #5   Title  Berg Balance >45/56 to indicate lower fall risk.     Baseline        Time  8    Period  Weeks    Status  New     Target Date  10/19/17      PT LONG TERM GOAL #6   Title  Dynamic Gait Index with cane >/= 18/24 to indicate lower fall risk.    Time  8    Period  Weeks    Status  New    Target Date  10/19/17            Plan - 09/13/17 1320    Clinical Impression Statement  Session focused on answering pt's questions related to gait training to emphasize hip extension and decrease knee hyperextension. Focus also on bil LE strength and balance and began to educate on appropriate exercises/equipment she can use at the fitness center (per pt request). Patient to bring pictures of equipment available at center next visit. She wants to transition to working out there as she concludes PT sessions.     Rehab Potential  Good    Clinical Impairments Affecting Rehab Potential  MS with fatigue limitations,     PT Frequency  2x / week    PT Duration  8 weeks    PT Treatment/Interventions  ADLs/Self Care Home Management;Canalith Repostioning;Ultrasound;DME Instruction;Gait training;Stair training;Functional mobility training;Therapeutic activities;Therapeutic exercise;Balance training;Neuromuscular re-education;Patient/family education;Manual techniques;Vestibular    PT Next Visit Plan  review pics (if pt brings) of fitness center equipment she has available to her; continue scapular stability -especially serratus anterior training progress to closed chain.  L closed chain hip ABD strengthening (tall kneeling/quadruped); gait with trekking poles for more symmetrical trunk and weight shift.  Keep an eye on L knee control and foot clearance     PT Home Exercise Plan  [email or web address]     Consulted and Agree with Plan of Care  Patient       Patient will benefit from skilled therapeutic intervention in order to improve the following deficits and impairments:  Abnormal gait, Decreased activity tolerance, Decreased balance, Decreased endurance, Decreased knowledge of use of DME, Decreased mobility, Decreased strength, Dizziness,  Pain  Visit Diagnosis: Other abnormalities of gait and mobility  Muscle weakness (generalized)  Unsteadiness on feet     Problem List Patient Active Problem List   Diagnosis Date Noted  . Establishing care with new doctor, encounter for 02/12/2017  . Lyme disease 11/03/2014  . Hypothyroidism (acquired) 11/03/2014  . Other fatigue 11/03/2014  . MULTIPLE SCLEROSIS 09/01/2008    Rexanne Mano, PT 09/13/2017, 1:24 PM  [facility] [address] [place], [place], [address] Phone: [phone]   Fax:  [phone]  Name: Samantha Church MRN: [id number] Date of Birth: 30-Jan-1960

## 2017-09-13 NOTE — Patient Instructions (Addendum)
   Yoga is also great form of exercise: online lots of free videos (Yoga with Adriene) or return to the beginners yoga class at your fitness center   Leg Press machine-make sure knees are not bent more than 90 degrees "L" angle; depends on the machine but you can usually do with both feet or one foot.   Elliptical machine-don't forget backwards, focus on pushing down thru heel and holding like ski poles

## 2017-09-17 ENCOUNTER — Encounter: Payer: Self-pay | Admitting: Rehabilitation

## 2017-09-17 ENCOUNTER — Ambulatory Visit: Payer: No Typology Code available for payment source | Admitting: Rehabilitation

## 2017-09-17 DIAGNOSIS — R2681 Unsteadiness on feet: Secondary | ICD-10-CM

## 2017-09-17 DIAGNOSIS — R2689 Other abnormalities of gait and mobility: Secondary | ICD-10-CM

## 2017-09-17 DIAGNOSIS — M6281 Muscle weakness (generalized): Secondary | ICD-10-CM

## 2017-09-17 NOTE — Patient Instructions (Addendum)
  Access Code: [email or web address]  URL: [email or web address]  Date: 09/17/2017  Prepared by: Cameron Sprang   Exercises  Sidelying Diagonal Hip Abduction - 10 reps - 2 sets - 1x daily - 5x weekly  Quadruped Fire Hydrant - 10 reps - 2 sets - 1x daily - 5x weekly  Seated Upper Trapezius Stretch - 2 sets - 30 second hold - 2x daily - 7x weekly  Supine Resisted Serratus Anterior Punches with Elbow Bent - 10 reps - 2 sets - 1x daily - 7x weekly  Romberg Stance Eyes Closed on Foam Pad - 3 reps - 1 sets - 20 hold - 1x daily - 7x weekly  Corner Pec Minor Stretch - 3 sets - 30 seconds hold - 2x daily - 7x weekly

## 2017-09-17 NOTE — Therapy (Signed)
[facility] [address] [place], [place], [address] Phone: [phone]   Fax:  [phone]  Physical Therapy Treatment  Patient Details  Name: Samantha Church MRN: [id number] Date of Birth: 11/06/1960 Referring Provider: Arlice Colt, MD   Encounter Date: 09/17/2017  PT End of Session - 09/17/17 1122    Visit Number  6    Number of Visits  17    Date for PT Re-Evaluation  10/19/17    Authorization Type  UHC visit limit 23 Speciality Services required $30 co-pay    Authorization - Visit Number  2   authorization period renewed 09/09/17 per pt   Authorization - Number of Visits  23    PT Start Time  1103    PT Stop Time  1147    PT Time Calculation (min)  44 min    Activity Tolerance  Patient tolerated treatment well    Behavior During Therapy  [facility] for tasks assessed/performed       Past Medical History:  Diagnosis Date  . Anemia   . Hypothyroidism    Dr. [name]  . Lyme disease   . Multiple sclerosis ([place])   . Neuromuscular disorder ([place])    MS  . Vision abnormalities     Past Surgical History:  Procedure Laterality Date  . BREAST SURGERY  2002  . COLONOSCOPY  2008  . DILATATION & CURRETTAGE/HYSTEROSCOPY WITH RESECTOCOPE N/A 10/09/2012   Procedure: DILATATION & CURETTAGE/HYSTEROSCOPY WITH RESECTOCOPE;  Surgeon: [name], MD;  Location: [facility];  Service: Gynecology;  Laterality: N/A;  RESECTION with Versapoint   . fibroid  2019   fibroidectomy  . KNEE ARTHROSCOPY  1996  . KNEE ARTHROSCOPY  1996    There were no vitals filed for this visit.  Subjective Assessment - 09/17/17 1106    Subjective  Pt reports some low back pain with exercises.     Pertinent History  relapsing MS, hypothyroidism, L knee arthroscopy, Lyme disease    Limitations  Lifting;Standing;Walking;House hold activities    Patient Stated Goals  To build strength to walk without support and learn proper support if needed. Learned  exercises    Currently in Pain?  Yes    Pain Score  3     Pain Location  Elbow    Pain Orientation  Right    Pain Descriptors / Indicators  Sore    Pain Type  Chronic pain    Pain Onset  More than a month ago    Pain Frequency  Constant    Aggravating Factors   walking with walking sticks, bowling    Pain Relieving Factors  not putting weight through it.               Access Code: [email or web address]  URL: [email or web address]  Date: 09/17/2017  Prepared by: Cameron Sprang   Exercises  Sidelying Diagonal Hip Abduction - 10 reps - 2 sets - 1x daily - 5x weekly  Quadruped Fire Hydrant - 10 reps - 2 sets - 1x daily - 5x weekly  Seated Upper Trapezius Stretch - 2 sets - 30 second hold - 2x daily - 7x weekly  Supine Resisted Serratus Anterior Punches with Elbow Bent - 10 reps - 2 sets - 1x daily - 7x weekly  Romberg Stance Eyes Closed on Foam Pad - 3 reps - 1 sets - 20 hold - 1x daily - 7x weekly  Corner Pec Minor Stretch - 3 sets - 30 seconds hold -  2x daily - 7x weekly   Did not perform UE exercises during session.            [facility] Adult PT Treatment/Exercise - 09/17/17 1104      Neuro Re-ed    Neuro Re-ed Details   Continue with high level balance to address LLE stabilization/control in closed chain; in // bars standing on BOSU (black top) maintaining balance x 2 reps of 20 secs, feet apart performing squats with light UE support x 10 reps (cues for decreased range to emphasize control in LLE), moving BOSU clockwise and counterclockwise x 5 reps each with light UE support and cues for more controlled L lateral weight shift.  Standing on BOSU (blue top) with single leg in stance on BOSU while advancing opposite LE from ground to chair seat and back x 10 reps on each side with light UE support as able (needed one UE at all times), maintaining balance on BOSU (blue top) with feet slightly apart with EO x 20 secs.  Note increased challenge, therefore had her move to corner  standing on pillows with feet apart EO x 20 secs progressing to EC x 2 sets of 20 secs.  Note moderate challenge, therefore added to HEP.        Exercises   Exercises  Knee/Hip    Other Exercises   Went through current HEP (LE strength exercises) as pt reports she is having some slight back pain with exercises.  Performed SL hip abd with min cues for slight hip extension along with abd.  Also cues to do 5 reps, take small rest break and then do 5 more as to avoid fatigue and trunk/back compensation.  Also performed side plank on knees, however this was difficult for pt to maintain without overt forward weight shift, therefore modified exercise to perform quadruped hydrant exercise as she already has but add hip flex to task to increase work of tranverse and oblique abdominals.  See updated HEP.        Knee/Hip Exercises: Machines for Strengthening   Cybex Leg Press  BLE x 10 reps with 60lbs, LLE only with 30 lbs x 10 reps.  Education on proper hip and knee placement and control in B knees.              PT Education - 09/17/17 1108    Education Details  updates to [employer]) Educated  Patient    Methods  Explanation;Demonstration;Handout    Comprehension  Verbalized understanding;Returned demonstration       PT Short Term Goals - 08/23/17 1800      PT SHORT TERM GOAL #1   Title  Patient verbalizes & demonstrates understanding of initial HEP. (All STGs Target Date: 09/21/2017)    Time  4    Period  Weeks    Status  New    Target Date  09/21/17      PT SHORT TERM GOAL #2   Title  Patient ambulates 500' with cane on indoor & paved surfaces with supervision.     Time  4    Period  Weeks    Status  New    Target Date  09/21/17      PT SHORT TERM GOAL #3   Title  Patient negotiates ramps & curbs with cane with supervision.     Time  4    Period  Weeks    Status  New    Target Date  09/21/17  PT SHORT TERM GOAL #4   Title  Berg Balance >/= 41/56    Time  4     Period  Weeks    Status  New    Target Date  09/21/17        PT Long Term Goals - 08/23/17 1800      PT LONG TERM GOAL #1   Title  Patient demonstrates & verbalizes understanding of ongoing HEP & fitness plan including community resources. (All LTGs Target Date: 10/19/2017)    Baseline        Time  8    Period  Weeks    Status  New    Target Date  10/19/17      PT LONG TERM GOAL #2   Title  Patient reports 50% improvement in right arm pain.     Baseline        Time  8    Period  Weeks    Status  New    Target Date  10/19/17      PT LONG TERM GOAL #3   Title  Patient ambulates 1000' with cane outdoors on paved & grass surfaces, ramps & curbs modified independent.     Baseline        Time  8    Period  Weeks    Status  New    Target Date  10/19/17      PT LONG TERM GOAL #4   Title  Patient ambulates around furniture simulating household gait without device modified independent.     Baseline        Time  8    Period  Weeks    Status  New    Target Date  10/19/17      PT LONG TERM GOAL #5   Title  Berg Balance >45/56 to indicate lower fall risk.     Baseline        Time  8    Period  Weeks    Status  New    Target Date  10/19/17      PT LONG TERM GOAL #6   Title  Dynamic Gait Index with cane >/= 18/24 to indicate lower fall risk.    Time  8    Period  Weeks    Status  New    Target Date  10/19/17            Plan - 09/17/17 1255    Clinical Impression Statement  Session focused on modifying strengthening HEP to avoid trunk/low back compensation and increased pain.  Also performed leg press machine today with education on how to use at gym (she has not gone to gym to take pictures yet).  Also continue to work on balance and LLE control in closed chain exercises.      Rehab Potential  Good    Clinical Impairments Affecting Rehab Potential  MS with fatigue limitations,     PT Frequency  2x / week    PT Duration  8 weeks    PT Treatment/Interventions   ADLs/Self Care Home Management;Canalith Repostioning;Ultrasound;DME Instruction;Gait training;Stair training;Functional mobility training;Therapeutic activities;Therapeutic exercise;Balance training;Neuromuscular re-education;Patient/family education;Manual techniques;Vestibular    PT Next Visit Plan  STGs, review pics (if pt brings) of fitness center equipment she has available to her; continue scapular stability -especially serratus anterior training progress to closed chain.  L closed chain hip ABD strengthening (tall kneeling/quadruped); gait with trekking poles for more symmetrical trunk and weight shift.  Keep an  eye on L knee control and foot clearance     PT Home Exercise Plan  [email or web address]     Consulted and Agree with Plan of Care  Patient       Patient will benefit from skilled therapeutic intervention in order to improve the following deficits and impairments:  Abnormal gait, Decreased activity tolerance, Decreased balance, Decreased endurance, Decreased knowledge of use of DME, Decreased mobility, Decreased strength, Dizziness, Pain  Visit Diagnosis: Other abnormalities of gait and mobility  Muscle weakness (generalized)  Unsteadiness on feet     Problem List Patient Active Problem List   Diagnosis Date Noted  . Establishing care with new doctor, encounter for 02/12/2017  . Lyme disease 11/03/2014  . Hypothyroidism (acquired) 11/03/2014  . Other fatigue 11/03/2014  . MULTIPLE SCLEROSIS 09/01/2008    Cameron Sprang, PT, MPT [facility] [address] [place], [place], [address] Phone: [phone]   Fax:  [phone] 09/17/17, 12:58 PM  Name: Samantha Church MRN: [id number] Date of Birth: 1960/12/21

## 2017-09-19 ENCOUNTER — Ambulatory Visit: Payer: No Typology Code available for payment source | Admitting: Physical Therapy

## 2017-09-19 ENCOUNTER — Encounter: Payer: Self-pay | Admitting: Physical Therapy

## 2017-09-19 DIAGNOSIS — M79601 Pain in right arm: Secondary | ICD-10-CM

## 2017-09-19 DIAGNOSIS — R2681 Unsteadiness on feet: Secondary | ICD-10-CM

## 2017-09-19 DIAGNOSIS — R2689 Other abnormalities of gait and mobility: Secondary | ICD-10-CM

## 2017-09-19 DIAGNOSIS — R42 Dizziness and giddiness: Secondary | ICD-10-CM

## 2017-09-19 DIAGNOSIS — M6281 Muscle weakness (generalized): Secondary | ICD-10-CM

## 2017-09-20 NOTE — Therapy (Signed)
[facility] [address] [place], [place], [address] Phone: [phone]   Fax:  [phone]  Physical Therapy Treatment  Patient Details  Name: Samantha Church MRN: [id number] Date of Birth: [birth year] Referring Provider: Arlice Colt, MD   Encounter Date: 09/19/2017  PT End of Session - 09/19/17 2237    Visit Number  7    Number of Visits  17    Date for PT Re-Evaluation  10/19/17    Authorization Type  [employer] visit limit 23 Speciality Services required $30 co-pay    Authorization - Visit Number  3   authorization period renewed 09/09/17 per pt   Authorization - Number of Visits  23    PT Start Time  1230    PT Stop Time  1315    PT Time Calculation (min)  45 min    Activity Tolerance  Patient tolerated treatment well    Behavior During Therapy  [facility] for tasks assessed/performed       Past Medical History:  Diagnosis Date  . Anemia   . Hypothyroidism    Dr. [name]  . Lyme disease   . Multiple sclerosis ([place])   . Neuromuscular disorder ([place])    MS  . Vision abnormalities     Past Surgical History:  Procedure Laterality Date  . BREAST SURGERY  2002  . COLONOSCOPY  2008  . DILATATION & CURRETTAGE/HYSTEROSCOPY WITH RESECTOCOPE N/A 10/09/2012   Procedure: DILATATION & CURETTAGE/HYSTEROSCOPY WITH RESECTOCOPE;  Surgeon: [name], MD;  Location: [facility];  Service: Gynecology;  Laterality: N/A;  RESECTION with Versapoint   . fibroid  2019   fibroidectomy  . KNEE ARTHROSCOPY  1996  . KNEE ARTHROSCOPY  1996    There were no vitals filed for this visit.  Self-care: pt education on elbow anatomy including radius/ulna supination/pronation related to her elbow pain. PT instructed in ice massage with demo.   Therapeutic Exercise Sidelying Diagonal Hip Abduction - maintaining neutral rotation with visual cue level foot and keep in line with trunk, can use back of couch as target.               Quadruped Engineer, site - visualize target of hydrant to limit spinal rotation     Seated Upper Trapezius Stretch    10 reps              Supine Resisted Serratus Anterior Punches with Elbow Bent 10 reps                Seated cervical retraction / closed chain into pillow 5 sec hold 10 reps Shoulder/scapular depression with humerus neutral with trunk 5 sec hold 10 reps Perform cervical retraction & scapular depression then cervical sidebend                          PT Education - 09/19/17 2233    Education Details  reveiwed HEP with correction to technique,  elbow pain/radius/ulna & hand pronation /supination/neutral positioning with elbow motions & assistive devices, ice massage    Person(s) Educated  Patient    Methods  Explanation;Demonstration;Tactile cues;Verbal cues   internet & skeleton for anatomy   Comprehension  Verbalized understanding       PT Short Term Goals - 09/19/17 2238      PT SHORT TERM GOAL #1   Title  Patient verbalizes & demonstrates understanding of initial HEP. (All STGs Target Date: 09/21/2017)  Baseline  MET 09/19/2017    Time  4    Period  Weeks    Status  Achieved      PT SHORT TERM GOAL #2   Title  Patient ambulates 500' with walking sticks on indoor & paved surfaces with supervision.     Time  4    Period  Weeks    Status  Revised    Target Date  09/24/17      PT SHORT TERM GOAL #3   Title  Patient negotiates ramps & curbs with walking sticks with supervision.     Time  4    Period  Weeks    Status  Revised    Target Date  09/24/17      PT SHORT TERM GOAL #4   Title  Berg Balance >/= 41/56    Time  4    Period  Weeks    Status  On-going    Target Date  09/24/17        PT Long Term Goals - 09/19/17 2239      PT LONG TERM GOAL #1   Title  Patient demonstrates & verbalizes understanding of ongoing HEP & fitness plan including community resources. (All LTGs Target Date: 10/19/2017)    Time  8    Period  Weeks    Status   On-going    Target Date  10/19/17      PT LONG TERM GOAL #2   Title  Patient reports 50% improvement in right arm pain.     Time  8    Period  Weeks    Status  On-going    Target Date  10/19/17      PT LONG TERM GOAL #3   Title  Patient ambulates 1000' with cane or walking sticks outdoors on paved & grass surfaces, ramps & curbs modified independent.     Time  8    Period  Weeks    Status  On-going    Target Date  10/19/17      PT LONG TERM GOAL #4   Title  Patient ambulates around furniture simulating household gait without device modified independent.     Time  8    Period  Weeks    Status  On-going    Target Date  10/19/17      PT LONG TERM GOAL #5   Title  Berg Balance >45/56 to indicate lower fall risk.     Time  8    Period  Weeks    Status  On-going    Target Date  10/19/17      PT LONG TERM GOAL #6   Title  Dynamic Gait Index with cane >/= 18/24 to indicate lower fall risk.    Time  8    Period  Weeks    Status  On-going    Target Date  10/19/17            Plan - 09/19/17 2242    Clinical Impression Statement  Patient appears to have better understanding of HEP without substitutions. She also seems to understand how pronation & supination with radius & ulna can contribute to her elbow pain and use of ice massage to improve.     Rehab Potential  Good    Clinical Impairments Affecting Rehab Potential  MS with fatigue limitations,     PT Frequency  2x / week    PT Duration  8 weeks    PT Treatment/Interventions  ADLs/Self Care Home Management;Canalith Repostioning;Ultrasound;DME Instruction;Gait training;Stair training;Functional mobility training;Therapeutic activities;Therapeutic exercise;Balance training;Neuromuscular re-education;Patient/family education;Manual techniques;Vestibular    PT Next Visit Plan  check STGs.     PT Home Exercise Plan  [email or web address]     Consulted and Agree with Plan of Care  Patient       Patient will benefit from skilled  therapeutic intervention in order to improve the following deficits and impairments:  Abnormal gait, Decreased activity tolerance, Decreased balance, Decreased endurance, Decreased knowledge of use of DME, Decreased mobility, Decreased strength, Dizziness, Pain  Visit Diagnosis: Other abnormalities of gait and mobility  Muscle weakness (generalized)  Unsteadiness on feet  Pain in right arm  Dizziness and giddiness     Problem List Patient Active Problem List   Diagnosis Date Noted  . Establishing care with new doctor, encounter for 02/12/2017  . Lyme disease 11/03/2014  . Hypothyroidism (acquired) 11/03/2014  . Other fatigue 11/03/2014  . MULTIPLE SCLEROSIS 09/01/2008    Jamey Reas PT, DPT 09/20/2017, 10:45 PM  [facility] [address] [place], [place], [address] Phone: [phone]   Fax:  [phone]  Name: RUBEN PYKA MRN: [id number] Date of Birth: [birth year]

## 2017-09-20 NOTE — Patient Instructions (Signed)
Access Code: [email or web address]   Sidelying Diagonal Hip Abduction - 10 reps - 2 sets - 1x daily - 5x weekly   Quadruped Fire Hydrant - 10 reps - 2 sets - 1x daily - 5x weekly  Seated Upper Trapezius Stretch - 2 sets - 30 second hold - 2x daily - 7x weekly   Supine Resisted Serratus Anterior Punches with Elbow Bent - 10 reps - 2 sets - 1x daily - 7x weekly   Romberg Stance Eyes Closed on Foam Pad - 3 reps - 1 sets - 20 hold - 1x daily - 7x weekly  Corner Pec Minor Stretch - 3 sets - 30 seconds hold - 2x daily - 7x weekly

## 2017-09-21 NOTE — Telephone Encounter (Signed)
LMOM for Barnetta Chapel to call regarding Tecfidera rx/fim

## 2017-09-21 NOTE — Telephone Encounter (Signed)
LMOM (identified vm) for Samantha Church to call me regarding Samantha Church's Tecfidera/fim

## 2017-09-21 NOTE — Telephone Encounter (Signed)
Barnetta Chapel returned RN's call

## 2017-09-24 ENCOUNTER — Encounter: Payer: Self-pay | Admitting: Physical Therapy

## 2017-09-24 ENCOUNTER — Ambulatory Visit: Payer: No Typology Code available for payment source | Admitting: Physical Therapy

## 2017-09-24 DIAGNOSIS — R2689 Other abnormalities of gait and mobility: Secondary | ICD-10-CM | POA: Diagnosis not present

## 2017-09-24 DIAGNOSIS — R2681 Unsteadiness on feet: Secondary | ICD-10-CM

## 2017-09-24 DIAGNOSIS — M6281 Muscle weakness (generalized): Secondary | ICD-10-CM

## 2017-09-24 DIAGNOSIS — M79601 Pain in right arm: Secondary | ICD-10-CM

## 2017-09-24 MED ORDER — DIMETHYL FUMARATE 240 MG PO CPDR: DELAYED_RELEASE_CAPSULE | ORAL | 11 refills | Status: DC

## 2017-09-24 NOTE — Therapy (Signed)
[facility] [address] [place], [place], [address] Phone: [phone]   Fax:  [phone]  Physical Therapy Treatment  Patient Details  Name: Samantha Church MRN: [id number] Date of Birth: November 20, 1960 Referring Provider: Arlice Colt, MD   Encounter Date: 09/24/2017  PT End of Session - 09/24/17 1258    Visit Number  8    Number of Visits  17    Date for PT Re-Evaluation  10/19/17    Authorization Type  UHC visit limit 23 Speciality Services required $30 co-pay    Authorization - Visit Number  4    Authorization - Number of Visits  23    PT Start Time  1017    PT Stop Time  1104    PT Time Calculation (min)  47 min    Equipment Utilized During Treatment  Gait belt    Activity Tolerance  Patient tolerated treatment well    Behavior During Therapy  WFL for tasks assessed/performed       Past Medical History:  Diagnosis Date  . Anemia   . Hypothyroidism    Dr. [name]  . Lyme disease   . Multiple sclerosis ([place])   . Neuromuscular disorder ([place])    MS  . Vision abnormalities     Past Surgical History:  Procedure Laterality Date  . BREAST SURGERY  2002  . COLONOSCOPY  2008  . DILATATION & CURRETTAGE/HYSTEROSCOPY WITH RESECTOCOPE N/A 10/09/2012   Procedure: DILATATION & CURETTAGE/HYSTEROSCOPY WITH RESECTOCOPE;  Surgeon: [name], MD;  Location: [facility];  Service: Gynecology;  Laterality: N/A;  RESECTION with Versapoint   . fibroid  2019   fibroidectomy  . KNEE ARTHROSCOPY  1996  . KNEE ARTHROSCOPY  1996    There were no vitals filed for this visit.  Subjective Assessment - 09/24/17 1019    Subjective  Pt reports her exercises are helping and walking with her walking pole is better than with her cane. Pt reports after walking 5-10 minutes she notices her left knee buckles and her toe drags but with seated rest breaks she is able to walk again with no issues.    Pertinent History  relapsing MS,  hypothyroidism, L knee arthroscopy, Lyme disease    Limitations  Lifting;Standing;Walking;House hold activities    Patient Stated Goals  To build strength to walk without support and learn proper support if needed. Learned exercises    Currently in Pain?  No/denies   having R elbow soreness and performs ice with massage for relief .   Pain Onset  More than a month ago         [facility] PT Assessment - 09/24/17 1022      Standardized Balance Assessment   Standardized Balance Assessment  Berg Balance Test      Berg Balance Test   Sit to Stand  Able to stand without using hands and stabilize independently    Standing Unsupported  Able to stand safely 2 minutes    Sitting with Back Unsupported but Feet Supported on Floor or Stool  Able to sit safely and securely 2 minutes    Stand to Sit  Sits safely with minimal use of hands    Transfers  Able to transfer safely, minor use of hands    Standing Unsupported with Eyes Closed  Able to stand 10 seconds with supervision    Standing Ubsupported with Feet Together  Able to place feet together independently and stand for 1 minute with supervision  From Standing, Reach Forward with Outstretched Arm  Can reach confidently >25 cm (10")    From Standing Position, Pick up Object from [place] to pick up shoe safely and easily    From Standing Position, Turn to Look Behind Over each Shoulder  Looks behind from both sides and weight shifts well    Turn 360 Degrees  Able to turn 360 degrees safely in 4 seconds or less    Standing Unsupported, Alternately Place Feet on Step/Stool  Able to stand independently and complete 8 steps >20 seconds    Standing Unsupported, One Foot in Front  Able to plae foot ahead of the other independently and hold 30 seconds    Standing on One Leg  Able to lift leg independently and hold 5-10 seconds    Total Score  51    Berg comment:  51/56 indicates moderate fall risk   prior score 41/56        [facility] Adult PT  Treatment/Exercise - 09/24/17 1023      Ambulation/Gait   Ambulation/Gait  Yes    Ambulation/Gait Assistance  5: Supervision    Ambulation/Gait Assistance Details  Pt ambulates outdoors with walking pole on R UE for assist to assess STG's. Pt ambulates on uneven surfaces, grass, curbs, inclines, declines, and varies gait speed when cued. Pt able to perform all tasks assessed with supervision and verbal cues to scan her environment to increase challenge with functional mobility. Therapist also assessed gait indoors due to Pt c/o L knee buckling and L toe dragging with increased fatigue. Pt cued to take equal step lengths to promote equal weight shifting. Therapist notes no knee buckling episodes with x1 trial of gait on level surfaces for 115 ft and attributes knee buckling episodes 2/2 muscular fatigue and MS dx.    Ambulation Distance (Feet)  500 Feet    Assistive device  Other (Comment)   walking pole   Gait Pattern  Step-through pattern;Decreased arm swing - left;Decreased stride length;Decreased step length - right;Decreased hip/knee flexion - left;Decreased dorsiflexion - left;Lateral hip instability;Poor foot clearance - left    Ambulation Surface  Unlevel;Level;Indoor;Outdoor;Paved;Grass      Standardized Balance Assessment   Standardized Balance Assessment  --      High Level Balance   High Level Balance Activities  Braiding;Tandem walking    High Level Balance Comments  Pt requires counter top support to maintain balance during tandem walking with close supervision 2/2 increased lateral sway       Exercises   Exercises  Knee/Hip    Other Exercises   Pt performs tall kneeling on compliant mat performing zoom ball with therapist with different UE patterns to increase both internal and external pertubations for inc challenge. Pt performs for 3 minute duration before requiring break. Therapist cues to "relax shoulders" to dec upper trap activation with task and to perform abdominal  contractions for TA activation.              PT Education - 09/24/17 1257    Education Details  Reviewed tandem walking exercise for HEP update; clinical findings with STG assessment; increasing seated rest breaks during L knee fatigue contributing to knee buckling; inc hip stability to prevent hip drop and increase pelvic stability     Person(s) Educated  Patient    Methods  Explanation    Comprehension  Verbalized understanding       PT Short Term Goals - 09/24/17 1021      PT  SHORT TERM GOAL #1   Title  Patient verbalizes & demonstrates understanding of initial HEP. (All STGs Target Date: 09/21/2017)    Baseline  MET 09/19/2017    Time  4    Period  Weeks    Status  Achieved      PT SHORT TERM GOAL #2   Title  Patient ambulates 500' with walking sticks on indoor & paved surfaces with supervision.     Time  4    Period  Weeks    Status  Achieved      PT SHORT TERM GOAL #3   Title  Patient negotiates ramps & curbs with walking sticks with supervision.     Time  4    Period  Weeks    Status  Achieved      PT SHORT TERM GOAL #4   Title  Berg Balance >/= 41/56    Baseline  51/56 on 9/16    Time  4    Period  Weeks    Status  Achieved        PT Long Term Goals - 09/25/17 1157      PT LONG TERM GOAL #1   Title  Patient demonstrates & verbalizes understanding of ongoing HEP & fitness plan including community resources. (All LTGs Target Date: 10/19/2017)    Time  8    Period  Weeks    Status  On-going      PT LONG TERM GOAL #2   Title  Patient reports 50% improvement in right arm pain.     Time  8    Period  Weeks    Status  On-going      PT LONG TERM GOAL #3   Title  Patient ambulates 1000' with cane or walking sticks outdoors on paved & grass surfaces, ramps & curbs modified independent.     Time  8    Period  Weeks    Status  On-going      PT LONG TERM GOAL #4   Title  Patient ambulates around furniture simulating household gait without device modified  independent.     Time  8    Period  Weeks    Status  On-going      PT LONG TERM GOAL #5   Title  Berg Balance >53/56 to indicate lower fall risk.     Baseline  51/56    Time  8    Period  Weeks    Status  Revised      PT LONG TERM GOAL #6   Title  Dynamic Gait Index with cane >/= 18/24 to indicate lower fall risk.   current score is 11/24   Time  8    Period  Weeks    Status  Revised            Plan - 09/24/17 1259    Clinical Impression Statement  Today's session focused on re assessing short term goals, hip stability in tall kneeling, and increasing dynamic balance. Pt achieved 3/3 STG's demonstrating improved balance during Berg reassessment indicated by her score of 51/56 placing her in the moderate fall risk category. Pt demonstrates ability to ambulate outdoors working on uneven surfaces, inclines, grass, curbs and varying gait speed with supervision and VC's to scan her environment. Pt demonstrates L glut medius weakness during dynamic balancing activities indicated by R hip drop during L SLS. Pt will continue to benefit from skilled PT to address balance deficits and strengthen hip  musculature to increase pelvic stability with funtional mobility tasks.    Rehab Potential  Good    Clinical Impairments Affecting Rehab Potential  MS with fatigue limitations,     PT Frequency  2x / week    PT Duration  8 weeks    PT Treatment/Interventions  ADLs/Self Care Home Management;Canalith Repostioning;Ultrasound;DME Instruction;Gait training;Stair training;Functional mobility training;Therapeutic activities;Therapeutic exercise;Balance training;Neuromuscular re-education;Patient/family education;Manual techniques;Vestibular    PT Next Visit Plan  tall kneeling & half kneeling on compliant surface with zoom ball & different UE positions, glut med strengthening (quadriped/ side step/ monster stepping), balance on bosu ball with squats and ball toss, rocker board with cone tapping, stepping  strategies on foam, balance on ramp incline/decline with EO/EC; tandem walking with head turns, side stepping on foam with ball toss, walking with dual tasks/ head turns/ ball toss with 2nd helper,     PT Home Exercise Plan  [email or web address]     Consulted and Agree with Plan of Care  Patient       Patient will benefit from skilled therapeutic intervention in order to improve the following deficits and impairments:  Abnormal gait, Decreased activity tolerance, Decreased balance, Decreased endurance, Decreased knowledge of use of DME, Decreased mobility, Decreased strength, Dizziness, Pain  Visit Diagnosis: Other abnormalities of gait and mobility  Muscle weakness (generalized)  Unsteadiness on feet     Problem List Patient Active Problem List   Diagnosis Date Noted  . Establishing care with new doctor, encounter for 02/12/2017  . Lyme disease 11/03/2014  . Hypothyroidism (acquired) 11/03/2014  . Other fatigue 11/03/2014  . MULTIPLE SCLEROSIS 09/01/2008    Floreen Comber, SPT 09/24/2017, 1:06 PM  [facility] [address] [place], [place], [address] Phone: [phone]   Fax:  [phone]  Name: Samantha Church MRN: [id number] Date of Birth: [birth year]

## 2017-09-24 NOTE — Patient Instructions (Signed)
Access Code: [email or web address]  URL: [email or web address]  Date: 09/24/2017    Exercises  Sidelying Diagonal Hip Abduction - 10 reps - 2 sets - 1x daily - 5x weekly  Quadruped Fire Hydrant - 10 reps - 2 sets - 1x daily - 5x weekly  Seated Upper Trapezius Stretch - 2 sets - 30 second hold - 2x daily - 7x weekly  Supine Resisted Serratus Anterior Punches with Elbow Bent - 10 reps - 2 sets - 1x daily - 7x weekly  Romberg Stance Eyes Closed on Foam Pad - 3 reps - 1 sets - 20 hold - 1x daily - 7x weekly  Corner Pec Minor Stretch - 3 sets - 30 seconds hold - 2x daily - 7x weekly  Tandem Walking with Counter Support - 10 reps - 2 sets - 1x daily - 7x weekly

## 2017-09-24 NOTE — Addendum Note (Signed)
Addended by: France Ravens I on: 09/24/2017 03:57 PM   Modules accepted: Orders

## 2017-09-25 ENCOUNTER — Telehealth: Payer: Self-pay | Admitting: Neurology

## 2017-09-25 NOTE — Telephone Encounter (Signed)
I have spoken with Samantha Church and ans. questions/fim

## 2017-09-25 NOTE — Telephone Encounter (Signed)
Patient in lobby now wanting to speak with Faith if she has a minute.

## 2017-09-26 ENCOUNTER — Encounter: Payer: Self-pay | Admitting: Physical Therapy

## 2017-09-26 ENCOUNTER — Ambulatory Visit: Payer: No Typology Code available for payment source | Admitting: Physical Therapy

## 2017-09-26 DIAGNOSIS — R42 Dizziness and giddiness: Secondary | ICD-10-CM

## 2017-09-26 DIAGNOSIS — R2689 Other abnormalities of gait and mobility: Secondary | ICD-10-CM | POA: Diagnosis not present

## 2017-09-26 DIAGNOSIS — M6281 Muscle weakness (generalized): Secondary | ICD-10-CM

## 2017-09-26 DIAGNOSIS — R2681 Unsteadiness on feet: Secondary | ICD-10-CM

## 2017-09-26 NOTE — Therapy (Signed)
[facility] [address] [place], [place], [address] Phone: [phone]   Fax:  [phone]  Physical Therapy Treatment  Patient Details  Name: Samantha Church MRN: [id number] Date of Birth: 1960-12-12 Referring Provider: Arlice Colt, MD   Encounter Date: 09/26/2017  PT End of Session - 09/26/17 1127    Visit Number  9    Number of Visits  17    Date for PT Re-Evaluation  10/19/17    Authorization Type  [employer] visit limit 23 Speciality Services required $30 co-pay    Authorization - Visit Number  5    Authorization - Number of Visits  23    PT Start Time  6962    PT Stop Time  1100    PT Time Calculation (min)  45 min    Equipment Utilized During Treatment  Gait belt    Activity Tolerance  Patient tolerated treatment well    Behavior During Therapy  WFL for tasks assessed/performed       Past Medical History:  Diagnosis Date  . Anemia   . Hypothyroidism    Dr. [name]  . Lyme disease   . Multiple sclerosis ([place])   . Neuromuscular disorder ([place])    MS  . Vision abnormalities     Past Surgical History:  Procedure Laterality Date  . BREAST SURGERY  2002  . COLONOSCOPY  2008  . DILATATION & CURRETTAGE/HYSTEROSCOPY WITH RESECTOCOPE N/A 10/09/2012   Procedure: DILATATION & CURETTAGE/HYSTEROSCOPY WITH RESECTOCOPE;  Surgeon: [name], MD;  Location: [facility];  Service: Gynecology;  Laterality: N/A;  RESECTION with Versapoint   . fibroid  2019   fibroidectomy  . KNEE ARTHROSCOPY  1996  . KNEE ARTHROSCOPY  1996    There were no vitals filed for this visit.  Subjective Assessment - 09/26/17 1017    Subjective  Pt reports no issues or any complaints. Pt reports her exercises having been going well and would like to extend her therapy points to further work on strengthening and balance. Pt reports her R medial epicondyle continues to be sore when waking up in the morning and continues to put ice on it and  massage. Pt reports increase in pain with direct palpation to medial epicondyle.     Pertinent History  relapsing MS, hypothyroidism, L knee arthroscopy, Lyme disease    Limitations  Lifting;Standing;Walking;House hold activities    Patient Stated Goals  To build strength to walk without support and learn proper support if needed. Learned exercises    Currently in Pain?  Yes    Pain Score  2     Pain Location  Elbow    Pain Orientation  Right    Pain Descriptors / Indicators  Sore    Pain Type  Chronic pain    Pain Onset  More than a month ago    Pain Frequency  Constant   every morning when waking up   Pain Relieving Factors  walking with walking stick has improved her pain and centralized symptoms                       [facility] Adult PT Treatment/Exercise - 09/26/17 1108      Transfers   Transfers  Sit to Stand;Stand to Sit    Sit to Stand  6: Modified independent (Device/Increase time)    Stand to Sit  6: Modified independent (Device/Increase time)      Balance   Balance Assessed  Yes      Dynamic Standing Balance   Dynamic Standing - Comments  Pt performs dynamic standing on complaint surface performing diagonal UE movement's holding 5.5# ball with therapist providing min guard to challenge ankle strategies, increase core stabilization, and hip musculature activation to stabilize during dynamic UE movements. Pt does report increased in dizziness with head and extremity movements during exercise but reports symptoms resolve within 2 min after stopping activity.        High Level Balance   High Level Balance Activities  Other (comment)    High Level Balance Comments  Pt performs alternating cone tapping with lateral stepping for x10 reps. Pt progresses to alternating LE cone tapping with double tap to increase challenge and stance time for 10 reps with lateral stepping. Pt further progresses to 15 reps on each LE with forward and diagonal cone tapping to increase  challege when crossing midline. Therapist provided multimodal cues to increase stance time to elicit greater glut med activation prior to performing cone tapping for increased pelvic stability during activity.        Exercises   Exercises  Knee/Hip    Other Exercises   Pt performs alternating kneeling on compliant mat performing zoom ball with therapist with different UE patterns to increase both internal and external pertubations for inc challenge. Pt performs 2 sets on each LE for 20 reps with zoom ball with rest break in between sets. Therapist cues to "relax shoulders" to dec upper trap activation with task and to perform abdominal contractions for TA activation.              PT Education - 09/26/17 1126    Education Details  Extending therapy appointments to further challenge balance and increase glut med activation, POC moving forward, continuing HEP exercises, continuing ice massage to medial epicondyle but that pain seems to be centralizing due to decreased overall pain in muscles attaching to medial epicondyle (wrist flexors)    Person(s) Educated  Patient    Methods  Explanation    Comprehension  Verbalized understanding       PT Short Term Goals - 09/24/17 1021      PT SHORT TERM GOAL #1   Title  Patient verbalizes & demonstrates understanding of initial HEP. (All STGs Target Date: 09/21/2017)    Baseline  MET 09/19/2017    Time  4    Period  Weeks    Status  Achieved      PT SHORT TERM GOAL #2   Title  Patient ambulates 500' with walking sticks on indoor & paved surfaces with supervision.     Time  4    Period  Weeks    Status  Achieved      PT SHORT TERM GOAL #3   Title  Patient negotiates ramps & curbs with walking sticks with supervision.     Time  4    Period  Weeks    Status  Achieved      PT SHORT TERM GOAL #4   Title  Berg Balance >/= 41/56    Baseline  51/56 on 9/16    Time  4    Period  Weeks    Status  Achieved        PT Long Term Goals -  09/25/17 1157      PT LONG TERM GOAL #1   Title  Patient demonstrates & verbalizes understanding of ongoing HEP & fitness plan including community resources. (All LTGs Target Date:  10/19/2017)    Time  8    Period  Weeks    Status  On-going      PT LONG TERM GOAL #2   Title  Patient reports 50% improvement in right arm pain.     Time  8    Period  Weeks    Status  On-going      PT LONG TERM GOAL #3   Title  Patient ambulates 1000' with cane or walking sticks outdoors on paved & grass surfaces, ramps & curbs modified independent.     Time  8    Period  Weeks    Status  On-going      PT LONG TERM GOAL #4   Title  Patient ambulates around furniture simulating household gait without device modified independent.     Time  8    Period  Weeks    Status  On-going      PT LONG TERM GOAL #5   Title  Berg Balance >53/56 to indicate lower fall risk.     Baseline  51/56    Time  8    Period  Weeks    Status  Revised      PT LONG TERM GOAL #6   Title  Dynamic Gait Index with cane >/= 18/24 to indicate lower fall risk.   current score is 11/24   Time  8    Period  Weeks    Status  Revised            Plan - 09/26/17 1128    Clinical Impression Statement  Today's session focused primarily on challenging dynamic balance and strengthening glut med to promote pelvic stabilization in SLS. Pt demonstrates difficulty when performing SLS requiring therapist to provide multimodal cues and pelvic facilitation for adequate upright posture to elicit increased glut med activation. Pt would further benefit from skilled PT to address dynamic balance deficits and promote LE strengthening to improve functional mobility.      Rehab Potential  Good    Clinical Impairments Affecting Rehab Potential  MS with fatigue limitations,     PT Frequency  2x / week    PT Duration  8 weeks    PT Treatment/Interventions  ADLs/Self Care Home Management;Canalith Repostioning;Ultrasound;DME Instruction;Gait  training;Stair training;Functional mobility training;Therapeutic activities;Therapeutic exercise;Balance training;Neuromuscular re-education;Patient/family education;Manual techniques;Vestibular    PT Next Visit Plan  add balance to HEP and revise exercises; tall kneeling & half kneeling on compliant surface with zoom ball & different UE positions, glut med strengthening (quadriped/ side step/ monster stepping), balance on bosu ball with squats and ball toss, rocker board with cone tapping, stepping strategies on foam, balance on ramp incline/decline with EO/EC; tandem walking with head turns, side stepping on foam with ball toss, walking with dual tasks/ head turns/ ball toss with 2nd helper,     PT Home Exercise Plan  4CMFLLAR     Consulted and Agree with Plan of Care  Patient       Patient will benefit from skilled therapeutic intervention in order to improve the following deficits and impairments:  Abnormal gait, Decreased activity tolerance, Decreased balance, Decreased endurance, Decreased knowledge of use of DME, Decreased mobility, Decreased strength, Dizziness, Pain  Visit Diagnosis: Other abnormalities of gait and mobility  Muscle weakness (generalized)  Unsteadiness on feet  Dizziness and giddiness     Problem List Patient Active Problem List   Diagnosis Date Noted  . Establishing care with new doctor, encounter for 02/12/2017  .  Lyme disease 11/03/2014  . Hypothyroidism (acquired) 11/03/2014  . Other fatigue 11/03/2014  . MULTIPLE SCLEROSIS 09/01/2008    Floreen Comber, SPT 09/26/2017, 11:37 AM  [facility] [address] [place], [place], [address] Phone: [phone]   Fax:  [phone]  Name: ALASHIA BROWNFIELD MRN: [id number] Date of Birth: 10/27/1960

## 2017-10-10 ENCOUNTER — Ambulatory Visit: Payer: No Typology Code available for payment source | Attending: Neurology | Admitting: Physical Therapy

## 2017-10-10 DIAGNOSIS — R2689 Other abnormalities of gait and mobility: Secondary | ICD-10-CM | POA: Insufficient documentation

## 2017-10-10 DIAGNOSIS — M6281 Muscle weakness (generalized): Secondary | ICD-10-CM | POA: Insufficient documentation

## 2017-10-10 DIAGNOSIS — R2681 Unsteadiness on feet: Secondary | ICD-10-CM | POA: Insufficient documentation

## 2017-10-10 DIAGNOSIS — M79601 Pain in right arm: Secondary | ICD-10-CM | POA: Insufficient documentation

## 2017-10-10 DIAGNOSIS — R42 Dizziness and giddiness: Secondary | ICD-10-CM | POA: Insufficient documentation

## 2017-10-12 ENCOUNTER — Ambulatory Visit: Payer: No Typology Code available for payment source | Admitting: Physical Therapy

## 2017-10-15 ENCOUNTER — Ambulatory Visit: Payer: No Typology Code available for payment source | Admitting: Physical Therapy

## 2017-10-15 ENCOUNTER — Encounter: Payer: Self-pay | Admitting: Physical Therapy

## 2017-10-15 DIAGNOSIS — M79601 Pain in right arm: Secondary | ICD-10-CM

## 2017-10-15 DIAGNOSIS — R2689 Other abnormalities of gait and mobility: Secondary | ICD-10-CM

## 2017-10-15 DIAGNOSIS — M6281 Muscle weakness (generalized): Secondary | ICD-10-CM

## 2017-10-15 DIAGNOSIS — R42 Dizziness and giddiness: Secondary | ICD-10-CM

## 2017-10-15 DIAGNOSIS — R2681 Unsteadiness on feet: Secondary | ICD-10-CM | POA: Diagnosis present

## 2017-10-15 NOTE — Patient Instructions (Signed)
Access Code: [email or web address]  URL: [email or web address]  Date: 10/15/2017  Prepared by: Jamey Reas   Exercises  Tandem Walking Along Line - 10 reps - 1 sets - 2 hold - 1x daily - 5x weekly  Braided Sidestepping - 10 reps - 1 sets - 2 hold - 1x daily - 5x weekly  Backwards Walking - 10 reps - 1 sets - 5 seconds hold - 1x daily - 5x weekly  Toe Walking - 10 reps - 1 sets - 2 hold - 1x daily - 5x weekly  Walking with Eyes Closed and Counter Support - 10 reps - 1 sets - 5 seconds hold - 1x daily - 5x weekly  Heel Walking - 10 reps - 1 sets - 2 hold - 1x daily - 5x weekly  Walking with Head Rotation - 10 reps - 1 sets - 5 seconds hold - 1x daily - 5x weekly  Forward Walking with Diagonal Head Turns - 10 reps - 1 sets - 5 seconds hold - 1x daily - 5x weekly  Walking with Head Nod - 10 reps - 1 sets - 5 seconds hold - 1x daily - 5x weekly

## 2017-10-16 NOTE — Therapy (Signed)
[facility] [address] [place], [place], [address] Phone: [phone]   Fax:  [phone]  Physical Therapy Treatment  Patient Details  Name: Samantha Church MRN: [id number] Date of Birth: [birth year] Referring Provider (PT): Arlice Colt, MD   Encounter Date: 10/15/2017  PT End of Session - 10/15/17 1638    Visit Number  10    Number of Visits  17    Date for PT Re-Evaluation  11/09/17   reset due to missed weeks & complete 8 weeks of POC   Authorization Type  [employer]HC visit limit 23 Speciality Services required $30 co-pay    Authorization - Visit Number  6    Authorization - Number of Visits  23    PT Start Time  1319    PT Stop Time  1400    PT Time Calculation (min)  41 min    Equipment Utilized During Treatment  Gait belt    Activity Tolerance  Patient tolerated treatment well    Behavior During Therapy  WFL for tasks assessed/performed       Past Medical History:  Diagnosis Date  . Anemia   . Hypothyroidism    Dr. [name]  . Lyme disease   . Multiple sclerosis ([place])   . Neuromuscular disorder ([employer])    MS  . Vision abnormalities     Past Surgical History:  Procedure Laterality Date  . BREAST SURGERY  2002  . COLONOSCOPY  2008  . DILATATION & CURRETTAGE/HYSTEROSCOPY WITH RESECTOCOPE N/A 10/09/2012   Procedure: DILATATION & CURETTAGE/HYSTEROSCOPY WITH RESECTOCOPE;  Surgeon: [name], MD;  Location: [facility];  Service: Gynecology;  Laterality: N/A;  RESECTION with Versapoint   . fibroid  2019   fibroidectomy  . KNEE ARTHROSCOPY  1996  . KNEE ARTHROSCOPY  1996    There were no vitals filed for this visit.  Subjective Assessment - 10/15/17 1324    Subjective  No falls. She has been doing exercises and they seem to be helping with strength.    Pertinent History  relapsing MS, hypothyroidism, L knee arthroscopy, Lyme disease    Limitations  Lifting;Standing;Walking;House hold activities    Patient  Stated Goals  To build strength to walk without support and learn proper support if needed. Learned exercises    Currently in Pain?  Yes    Pain Score  3     Pain Location  Elbow    Pain Orientation  Right    Pain Descriptors / Indicators  Sore    Pain Onset  More than a month ago    Pain Frequency  Constant    Aggravating Factors   touching it, sometimes hurts when wakes up    Pain Relieving Factors  ice massage,      Elbow Pain: Pt reports issues with elbow pain upon awakening. PT demo, instructed in some UE positions in sleeping that may effect her elbow pain with hand /wrist to face/head with wrist flexion or extension and elbow in maximal flexed position.  Pt reports she has a wrist splint (OTC velcro). PT recommended trying this first as controlling wrist position may discourage elbow flexion and less cumbersome. If wrist does not work, then try soft elbow wrap which can be done with towel & tape. Pt verbalized understanding of both.   Balance/gait in hall for visual reference & intermittent touch as needed. PT tactile & verbal cues along with handout.  gait with head turns - right/left, up/down & diagonals  with gaze stabilization between each movement.  Balance: side stepping, braiding, tandem, backwards gait, walking with eyes closed, heel walking, toe walking and increasing pace with longer stride length  Therapeutic Exercise:PT explained intensity difference with seated vs standing machines and benefits if machine uses both UEs & LEs to operate. PT set-up & explained set-up for NuStep machine. PT also discussed using sets with work/rest to increase overall time without fatigue.  Pt performed 5 minutes level 3 with BUEs & BLEs on NuStep. Pt verbalized understanding of above.                        PT Education - 10/15/17 1400    Education Details  updated HEP to include gait with head turns & balance,  UE splint for sleeping for positioning,     Person(s) Educated   Patient    Methods  Explanation;Demonstration;Tactile cues;Verbal cues;Handout    Comprehension  Verbalized understanding;Returned demonstration;Verbal cues required;Tactile cues required;Need further instruction       PT Short Term Goals - 09/24/17 1021      PT SHORT TERM GOAL #1   Title  Patient verbalizes & demonstrates understanding of initial HEP. (All STGs Target Date: 09/21/2017)    Baseline  MET 09/19/2017    Time  4    Period  Weeks    Status  Achieved      PT SHORT TERM GOAL #2   Title  Patient ambulates 500' with walking sticks on indoor & paved surfaces with supervision.     Time  4    Period  Weeks    Status  Achieved      PT SHORT TERM GOAL #3   Title  Patient negotiates ramps & curbs with walking sticks with supervision.     Time  4    Period  Weeks    Status  Achieved      PT SHORT TERM GOAL #4   Title  Berg Balance >/= 41/56    Baseline  51/56 on 9/16    Time  4    Period  Weeks    Status  Achieved        PT Long Term Goals - 10/15/17 1800      PT LONG TERM GOAL #1   Title  Patient demonstrates & verbalizes understanding of ongoing HEP & fitness plan including community resources. (All LTGs Target Date: 11/09/2017)    Time  8    Period  Weeks    Status  On-going    Target Date  11/09/17      PT LONG TERM GOAL #2   Title  Patient reports 50% improvement in right arm pain.     Time  8    Period  Weeks    Status  On-going    Target Date  11/09/17      PT LONG TERM GOAL #3   Title  Patient ambulates 1000' with cane or walking sticks outdoors on paved & grass surfaces, ramps & curbs modified independent.     Time  8    Period  Weeks    Status  On-going    Target Date  11/09/17      PT LONG TERM GOAL #4   Title  Patient ambulates around furniture simulating household gait without device modified independent.     Time  8    Period  Weeks    Status  On-going    Target Date  11/09/17  PT LONG TERM GOAL #5   Title  Berg Balance >53/56 to  indicate lower fall risk.     Baseline  51/56    Time  8    Period  Weeks    Status  Revised    Target Date  11/09/17      PT LONG TERM GOAL #6   Title  Dynamic Gait Index with cane >/= 18/24 to indicate lower fall risk.   current score is 11/24   Time  8    Period  Weeks    Status  Revised    Target Date  11/09/17            Plan - 10/15/17 1800    Clinical Impression Statement  Today was 6th week of treatment due to scheduling issues. PT reset LTG target date for 8 weeks of care per initial plan with patient in agreement.  PT progressed HEP to include standing balance & scanning with gait.  Pt plans to take pictures of equipment at exercise room for exercise room that she has access. She verbalized PT recommendation to consider intensity including seated vs standing cardio machines.     Rehab Potential  Good    Clinical Impairments Affecting Rehab Potential  MS with fatigue limitations,     PT Frequency  2x / week    PT Duration  8 weeks    PT Treatment/Interventions  ADLs/Self Care Home Management;Canalith Repostioning;Ultrasound;DME Instruction;Gait training;Stair training;Functional mobility training;Therapeutic activities;Therapeutic exercise;Balance training;Neuromuscular re-education;Patient/family education;Manual techniques;Vestibular    PT Next Visit Plan  check new HEP exercises for balance; check if she trialed wrist or elbow splint for sleeping; progress balance & strength activities.    PT Home Exercise Plan  4CMFLLAR & [id number]    Consulted and Agree with Plan of Care  Patient       Patient will benefit from skilled therapeutic intervention in order to improve the following deficits and impairments:  Abnormal gait, Decreased activity tolerance, Decreased balance, Decreased endurance, Decreased knowledge of use of DME, Decreased mobility, Decreased strength, Dizziness, Pain  Visit Diagnosis: Other abnormalities of gait and mobility  Muscle weakness  (generalized)  Unsteadiness on feet  Dizziness and giddiness  Pain in right arm     Problem List Patient Active Problem List   Diagnosis Date Noted  . Establishing care with new doctor, encounter for 02/12/2017  . Lyme disease 11/03/2014  . Hypothyroidism (acquired) 11/03/2014  . Other fatigue 11/03/2014  . MULTIPLE SCLEROSIS 09/01/2008    Jamey Reas PT, DPT 10/16/2017, 7:01 AM  [facility] [address] [place], [place], [address] Phone: [phone]   Fax:  [phone]  Name: SUNYA HUMBARGER MRN: [id number] Date of Birth: [birth year]

## 2017-10-18 ENCOUNTER — Encounter: Payer: Self-pay | Admitting: Physical Therapy

## 2017-10-18 ENCOUNTER — Ambulatory Visit: Payer: No Typology Code available for payment source | Admitting: Physical Therapy

## 2017-10-18 DIAGNOSIS — M79601 Pain in right arm: Secondary | ICD-10-CM

## 2017-10-18 DIAGNOSIS — R2681 Unsteadiness on feet: Secondary | ICD-10-CM

## 2017-10-18 DIAGNOSIS — M6281 Muscle weakness (generalized): Secondary | ICD-10-CM

## 2017-10-18 NOTE — Therapy (Signed)
[facility] [address] [place], [place], [address] Phone: [phone]   Fax:  [phone]  Physical Therapy Treatment  Patient Details  Name: Samantha Church MRN: [id number] Date of Birth: 05-04-1960 Referring Provider (PT): Arlice Colt, MD   Encounter Date: 10/18/2017    Past Medical History:  Diagnosis Date  . Anemia   . Hypothyroidism    Dr. [name]  . Lyme disease   . Multiple sclerosis ([place])   . Neuromuscular disorder ([place])    MS  . Vision abnormalities     Past Surgical History:  Procedure Laterality Date  . BREAST SURGERY  2002  . COLONOSCOPY  2008  . DILATATION & CURRETTAGE/HYSTEROSCOPY WITH RESECTOCOPE N/A 10/09/2012   Procedure: DILATATION & CURETTAGE/HYSTEROSCOPY WITH RESECTOCOPE;  Surgeon: [name], MD;  Location: [facility];  Service: Gynecology;  Laterality: N/A;  RESECTION with [facility]   . fibroid  2019   fibroidectomy  . KNEE ARTHROSCOPY  1996  . KNEE ARTHROSCOPY  1996    There were no vitals filed for this visit.  Subjective Assessment - 10/18/17 0759    Subjective  Was really fatigued after balance exercises last time and was surprised. Reports cannot find her wrist brace she thought she had and tried idea with towel to wrap UE and prevent elbow flexion, but it was not fastened well and fell off. Reports in her office she walks with only one walking pole, but uses her RUE (the sore elbow).     Pertinent History  relapsing MS, hypothyroidism, L knee arthroscopy, Lyme disease    Limitations  Lifting;Standing;Walking;House hold activities    Patient Stated Goals  To build strength to walk without support and learn proper support if needed. Learned exercises    Currently in Pain?  Yes    Pain Score  3     Pain Location  Elbow    Pain Orientation  Right    Pain Descriptors / Indicators  Sore    Pain Type  Chronic pain    Pain Onset  More than a month ago    Pain Frequency   Constant    Aggravating Factors   hurts when wakes; when puts pressure on arm    Pain Relieving Factors  ice massage; has not found wrist splint                       [facility] Adult PT Treatment/Exercise - 10/18/17 0837      Ambulation/Gait   Ambulation/Gait Assistance  5: Supervision;6: Modified independent (Device/Increase time)    Ambulation/Gait Assistance Details  ambulate with single walking pole in LUE to reduce strain on rt forearm/elbow to assist with reducing pain (pt has been using single pole in RUE when walking around office). Feels comfortable and demonstrated safe ambulation with pole in LUE    Ambulation Distance (Feet)  120 Feet    Assistive device  Other (Comment)   LUE walking pole     Exercises   Exercises  Other Exercises    Other Exercises   quadruped (forearms on green ball) alternating hip extesion with vc and min assist for hip stability in stance leg and level pelvis; quadruped with abd on ball and bil UE light support, hip extension then knee flexion alternating legs          Balance Exercises - 10/18/17 0850      Balance Exercises: Standing   Other Standing Exercises  on Bosu, flat side  up: narrow stance: alternating shoulder flexion; head turns, head nods, squats, squats holding 3.3 lb ball; single leg stance on Bosu with single UE support on same side as stance leg--lifted leg moves into hip flexion, hip abdct, hip ext x 10 reps   From elevated mat table (hips ~4" higher than knees) using bil UEs via walking poles, single leg sit to stand RLE x 10 minguard assist for balance, LLE min assist x 5 reps and then placed rt forefoot on yellow ball and minguard assist x 5 reps         PT Short Term Goals - 09/24/17 1021      PT SHORT TERM GOAL #1   Title  Patient verbalizes & demonstrates understanding of initial HEP. (All STGs Target Date: 09/21/2017)    Baseline  MET 09/19/2017    Time  4    Period  Weeks    Status  Achieved      PT  SHORT TERM GOAL #2   Title  Patient ambulates 500' with walking sticks on indoor & paved surfaces with supervision.     Time  4    Period  Weeks    Status  Achieved      PT SHORT TERM GOAL #3   Title  Patient negotiates ramps & curbs with walking sticks with supervision.     Time  4    Period  Weeks    Status  Achieved      PT SHORT TERM GOAL #4   Title  Berg Balance >/= 41/56    Baseline  51/56 on 9/16    Time  4    Period  Weeks    Status  Achieved        PT Long Term Goals - 10/15/17 1800      PT LONG TERM GOAL #1   Title  Patient demonstrates & verbalizes understanding of ongoing HEP & fitness plan including community resources. (All LTGs Target Date: 11/09/2017)    Time  8    Period  Weeks    Status  On-going    Target Date  11/09/17      PT LONG TERM GOAL #2   Title  Patient reports 50% improvement in right arm pain.     Time  8    Period  Weeks    Status  On-going    Target Date  11/09/17      PT LONG TERM GOAL #3   Title  Patient ambulates 1000' with cane or walking sticks outdoors on paved & grass surfaces, ramps & curbs modified independent.     Time  8    Period  Weeks    Status  On-going    Target Date  11/09/17      PT LONG TERM GOAL #4   Title  Patient ambulates around furniture simulating household gait without device modified independent.     Time  8    Period  Weeks    Status  On-going    Target Date  11/09/17      PT LONG TERM GOAL #5   Title  Berg Balance >53/56 to indicate lower fall risk.     Baseline  51/56    Time  8    Period  Weeks    Status  Revised    Target Date  11/09/17      PT LONG TERM GOAL #6   Title  Dynamic Gait Index with cane >/= 18/24 to indicate  lower fall risk.   current score is 11/24   Time  8    Period  Weeks    Status  Revised    Target Date  11/09/17            Plan - 10/18/17 1218    Clinical Impression Statement  Patient progressing well. Discussed her plan to complete PT appointments and then  start going to the gym she is a member of. Today focused on balance and LE strengthening to assist with improved walking and lesser fall risk. Patient very motivated and willing to attempt even the most challenging exercises suggested. Anticipate will meet LTGs in timeframe set.     Rehab Potential  Good    Clinical Impairments Affecting Rehab Potential  MS with fatigue limitations,     PT Frequency  2x / week    PT Duration  8 weeks    PT Treatment/Interventions  ADLs/Self Care Home Management;Canalith Repostioning;Ultrasound;DME Instruction;Gait training;Stair training;Functional mobility training;Therapeutic activities;Therapeutic exercise;Balance training;Neuromuscular re-education;Patient/family education;Manual techniques;Vestibular    PT Next Visit Plan  check if she trialed wrist or elbow splint for sleeping; has using walking pole in LUE in office helped with elbow pain?; progress balance & strength activities (pt LLE weaker than RLE)    PT Home Exercise Plan  4CMFLLAR & [id number]    Consulted and Agree with Plan of Care  Patient       Patient will benefit from skilled therapeutic intervention in order to improve the following deficits and impairments:  Abnormal gait, Decreased activity tolerance, Decreased balance, Decreased endurance, Decreased knowledge of use of DME, Decreased mobility, Decreased strength, Dizziness, Pain  Visit Diagnosis: No diagnosis found.     Problem List Patient Active Problem List   Diagnosis Date Noted  . Establishing care with new doctor, encounter for 02/12/2017  . Lyme disease 11/03/2014  . Hypothyroidism (acquired) 11/03/2014  . Other fatigue 11/03/2014  . MULTIPLE SCLEROSIS 09/01/2008    Rexanne Mano, PT 10/18/2017, 12:21 PM  [facility] [address] [place], [place], [address] Phone: [phone]   Fax:  [phone]  Name: Samantha Church MRN: [id number] Date of Birth:  October 17, 1960

## 2017-10-26 ENCOUNTER — Encounter: Payer: Self-pay | Admitting: Internal Medicine

## 2017-10-29 ENCOUNTER — Other Ambulatory Visit: Payer: Self-pay

## 2017-10-29 MED ORDER — NP THYROID 60 MG PO TABS: 60.0000 mg | ORAL_TABLET | Freq: Every day | ORAL | 3 refills | Status: DC

## 2017-11-02 ENCOUNTER — Ambulatory Visit: Payer: No Typology Code available for payment source | Admitting: Physical Therapy

## 2017-11-02 DIAGNOSIS — R42 Dizziness and giddiness: Secondary | ICD-10-CM

## 2017-11-02 DIAGNOSIS — R2681 Unsteadiness on feet: Secondary | ICD-10-CM

## 2017-11-02 DIAGNOSIS — M6281 Muscle weakness (generalized): Secondary | ICD-10-CM | POA: Diagnosis not present

## 2017-11-02 DIAGNOSIS — M79601 Pain in right arm: Secondary | ICD-10-CM

## 2017-11-02 DIAGNOSIS — R2689 Other abnormalities of gait and mobility: Secondary | ICD-10-CM

## 2017-11-02 NOTE — Therapy (Signed)
[facility] [address] [place], [place], [address] Phone: [phone]   Fax:  [phone]  Physical Therapy Treatment  Patient Details  Name: Samantha Church MRN: [id number] Date of Birth: February 14, 1960 Referring Provider (PT): Arlice Colt, MD   Encounter Date: 11/02/2017  PT End of Session - 11/02/17 0842    Visit Number  12    Number of Visits  17    Date for PT Re-Evaluation  11/09/17    Authorization Type  [employer] visit limit 23 [facility] required $30 co-pay    Authorization - Visit Number  8    Authorization - Number of Visits  23    PT Start Time  0815   pt 15 min late   PT Stop Time  0845    PT Time Calculation (min)  30 min    Activity Tolerance  Patient tolerated treatment well    Behavior During Therapy  [facility] for tasks assessed/performed       Past Medical History:  Diagnosis Date  . Anemia   . Hypothyroidism    Dr. [name]  . Lyme disease   . Multiple sclerosis ([place])   . Neuromuscular disorder ([place])    MS  . Vision abnormalities     Past Surgical History:  Procedure Laterality Date  . BREAST SURGERY  2002  . COLONOSCOPY  2008  . DILATATION & CURRETTAGE/HYSTEROSCOPY WITH RESECTOCOPE N/A 10/09/2012   Procedure: DILATATION & CURETTAGE/HYSTEROSCOPY WITH RESECTOCOPE;  Surgeon: [name], MD;  Location: [facility];  Service: Gynecology;  Laterality: N/A;  RESECTION with [facility]   . fibroid  2019   fibroidectomy  . KNEE ARTHROSCOPY  1996  . KNEE ARTHROSCOPY  1996    There were no vitals filed for this visit.  Subjective Assessment - 11/02/17 0841    Subjective  Pt relays her elbow feels better, the splint has helped.     Pain Score  2     Pain Location  Elbow    Pain Orientation  Right    Pain Descriptors / Indicators  Sore                       [facility] Adult PT Treatment/Exercise - 11/02/17 0001      Ambulation/Gait   Ambulation/Gait Assistance  5:  Supervision;6: Modified independent (Device/Increase time)    Ambulation/Gait Assistance Details  single walking stick in Lt UE    Ambulation Distance (Feet)  240 Feet      Dynamic Standing Balance   Dynamic Standing - Comments  bosu balance in parallel bars 1 min feet narrow, then feet apart with head turns up/down X 10, lateral X 10, bosu squats 2X10, bosu mini lunge front foot on bosu X 10 bilat, Single leg sit to stand 2X10, needs higher surface ht for Lt LE.       High Level Balance   High Level Balance Activities  Backward walking;Tandem walking    High Level Balance Comments  30 ft X 2, then lateral step overs on bosu X 10 ea with intermittent UE support needed, stairs X 5 without UE support      Neuro Re-ed    Neuro Re-ed Details   balance training               PT Short Term Goals - 09/24/17 1021      PT SHORT TERM GOAL #1   Title  Patient verbalizes & demonstrates understanding of initial  HEP. (All STGs Target Date: 09/21/2017)    Baseline  MET 09/19/2017    Time  4    Period  Weeks    Status  Achieved      PT SHORT TERM GOAL #2   Title  Patient ambulates 500' with walking sticks on indoor & paved surfaces with supervision.     Time  4    Period  Weeks    Status  Achieved      PT SHORT TERM GOAL #3   Title  Patient negotiates ramps & curbs with walking sticks with supervision.     Time  4    Period  Weeks    Status  Achieved      PT SHORT TERM GOAL #4   Title  Berg Balance >/= 41/56    Baseline  51/56 on 9/16    Time  4    Period  Weeks    Status  Achieved        PT Long Term Goals - 10/15/17 1800      PT LONG TERM GOAL #1   Title  Patient demonstrates & verbalizes understanding of ongoing HEP & fitness plan including community resources. (All LTGs Target Date: 11/09/2017)    Time  8    Period  Weeks    Status  On-going    Target Date  11/09/17      PT LONG TERM GOAL #2   Title  Patient reports 50% improvement in right arm pain.     Time  8     Period  Weeks    Status  On-going    Target Date  11/09/17      PT LONG TERM GOAL #3   Title  Patient ambulates 1000' with cane or walking sticks outdoors on paved & grass surfaces, ramps & curbs modified independent.     Time  8    Period  Weeks    Status  On-going    Target Date  11/09/17      PT LONG TERM GOAL #4   Title  Patient ambulates around furniture simulating household gait without device modified independent.     Time  8    Period  Weeks    Status  On-going    Target Date  11/09/17      PT LONG TERM GOAL #5   Title  Berg Balance >53/56 to indicate lower fall risk.     Baseline  51/56    Time  8    Period  Weeks    Status  Revised    Target Date  11/09/17      PT LONG TERM GOAL #6   Title  Dynamic Gait Index with cane >/= 18/24 to indicate lower fall risk.   current score is 11/24   Time  8    Period  Weeks    Status  Revised    Target Date  11/09/17            Plan - 11/02/17 0847    Clinical Impression Statement  Pt treated today for balance training, and LE strength and endurance with good tolerance. She does require a few rest breaks today due to fatique. She appears to be making good progress in all areas.     Rehab Potential  Good    Clinical Impairments Affecting Rehab Potential  MS with fatigue limitations,     PT Frequency  2x / week    PT Duration  8  weeks    PT Treatment/Interventions  ADLs/Self Care Home Management;Canalith Repostioning;Ultrasound;DME Instruction;Gait training;Stair training;Functional mobility training;Therapeutic activities;Therapeutic exercise;Balance training;Neuromuscular re-education;Patient/family education;Manual techniques;Vestibular    PT Next Visit Plan  progress balance & strength activities (pt LLE weaker than RLE)    PT Home Exercise Plan  4CMFLLAR & [id number]    Consulted and Agree with Plan of Care  Patient       Patient will benefit from skilled therapeutic intervention in order to improve the following  deficits and impairments:  Abnormal gait, Decreased activity tolerance, Decreased balance, Decreased endurance, Decreased knowledge of use of DME, Decreased mobility, Decreased strength, Dizziness, Pain  Visit Diagnosis: Muscle weakness (generalized)  Unsteadiness on feet  Pain in right arm  Other abnormalities of gait and mobility  Dizziness and giddiness     Problem List Patient Active Problem List   Diagnosis Date Noted  . Establishing care with new doctor, encounter for 02/12/2017  . Lyme disease 11/03/2014  . Hypothyroidism (acquired) 11/03/2014  . Other fatigue 11/03/2014  . MULTIPLE SCLEROSIS 09/01/2008    Debbe Odea, PT, DPT 11/02/2017, 8:57 AM  [facility] [address] [place], [place], [address] Phone: [phone]   Fax:  [phone]  Name: AKARI DEFELICE MRN: [id number] Date of Birth: 1960-03-10

## 2017-11-07 ENCOUNTER — Encounter: Payer: Self-pay | Admitting: Physical Therapy

## 2017-11-07 ENCOUNTER — Ambulatory Visit: Payer: No Typology Code available for payment source | Admitting: Physical Therapy

## 2017-11-07 DIAGNOSIS — R2681 Unsteadiness on feet: Secondary | ICD-10-CM

## 2017-11-07 DIAGNOSIS — R2689 Other abnormalities of gait and mobility: Secondary | ICD-10-CM

## 2017-11-07 DIAGNOSIS — M6281 Muscle weakness (generalized): Secondary | ICD-10-CM

## 2017-11-08 NOTE — Therapy (Signed)
[facility] [address] [place], [place], [address] Phone: [phone]   Fax:  [phone]  Physical Therapy Treatment  Patient Details  Name: Samantha Church MRN: [id number] Date of Birth: December 29, 1960 Referring Provider (PT): Arlice Colt, MD   Encounter Date: 11/07/2017  PT End of Session - 11/07/17 1845    Visit Number  13    Number of Visits  17    Date for PT Re-Evaluation  11/09/17    Authorization Type  [employer]HC visit limit 23 Speciality Services required $30 co-pay    Authorization - Visit Number  9    Authorization - Number of Visits  23    PT Start Time  4128    PT Stop Time  1100    PT Time Calculation (min)  45 min    Activity Tolerance  Patient tolerated treatment well    Behavior During Therapy  WFL for tasks assessed/performed       Past Medical History:  Diagnosis Date  . Anemia   . Hypothyroidism    Dr. [name]  . Lyme disease   . Multiple sclerosis ([place])   . Neuromuscular disorder ([place])    MS  . Vision abnormalities     Past Surgical History:  Procedure Laterality Date  . BREAST SURGERY  2002  . COLONOSCOPY  2008  . DILATATION & CURRETTAGE/HYSTEROSCOPY WITH RESECTOCOPE N/A 10/09/2012   Procedure: DILATATION & CURETTAGE/HYSTEROSCOPY WITH RESECTOCOPE;  Surgeon: [name], MD;  Location: [facility];  Service: Gynecology;  Laterality: N/A;  RESECTION with [facility]apoint   . fibroid  2019   fibroidectomy  . KNEE ARTHROSCOPY  1996  . KNEE ARTHROSCOPY  1996    There were no vitals filed for this visit.  Subjective Assessment - 11/07/17 1015    Subjective  wrist splint has helped elbow pain. No falls.     Pertinent History  relapsing MS, hypothyroidism, L knee arthroscopy, Lyme disease    Limitations  Lifting;Standing;Walking;House hold activities    Patient Stated Goals  To build strength to walk without support and learn proper support if needed. Learned exercises    Currently in Pain?  Yes     Pain Score  1     Pain Location  Elbow    Pain Orientation  Right    Pain Descriptors / Indicators  Sore    Pain Type  Chronic pain    Pain Onset  More than a month ago    Pain Frequency  Intermittent    Aggravating Factors   type a lot,     Pain Relieving Factors  uses pad or changes position of elbow when typing         [facility] PT Assessment - 11/07/17 1015      Ambulation/Gait   Ambulation/Gait  Yes    Ambulation/Gait Assistance  6: Modified independent (Device/Increase time)    Ambulation/Gait Assistance Details  carrying plate with objects & cup of water around furniture without device safely.     Ambulation Distance (Feet)  100 Feet    Assistive device  None    Gait Pattern  Within Functional Limits    Ambulation Surface  Indoor;Level    Gait velocity  3.75 ft/sec no device   Initial GV 2.85 ft/sec cane   Stairs  Yes    Stairs Assistance  6: Modified independent (Device/Increase time)    Stair Management Technique  One rail Right;Alternating pattern;Forwards    Number of Stairs  12  Berg Balance Test   Sit to Stand  Able to stand without using hands and stabilize independently    Standing Unsupported  Able to stand safely 2 minutes    Sitting with Back Unsupported but Feet Supported on Floor or Stool  Able to sit safely and securely 2 minutes    Stand to Sit  Sits safely with minimal use of hands    Transfers  Able to transfer safely, minor use of hands    Standing Unsupported with Eyes Closed  Able to stand 10 seconds safely    Standing Ubsupported with Feet Together  Able to place feet together independently and stand 1 minute safely    From Standing, Reach Forward with Outstretched Arm  Can reach confidently >25 cm (10")    From Standing Position, Pick up Object from Floor  Able to pick up shoe safely and easily    From Standing Position, Turn to Look Behind Over each Shoulder  Looks behind from both sides and weight shifts well    Turn 360 Degrees  Able to turn  360 degrees safely in 4 seconds or less    Standing Unsupported, Alternately Place Feet on Step/Stool  Able to stand independently and complete 8 steps >20 seconds    Standing Unsupported, One Foot in Front  Able to plae foot ahead of the other independently and hold 30 seconds    Standing on One Leg  Able to lift leg independently and hold 5-10 seconds    Total Score  53    Berg comment:  Initial Berg 37/56      Dynamic Gait Index   Level Surface  Normal    Change in Gait Speed  Mild Impairment    Gait with Horizontal Head Turns  Mild Impairment    Gait with Vertical Head Turns  Mild Impairment    Gait and Pivot Turn  Normal    Step Over Obstacle  Mild Impairment    Step Around Obstacles  Normal    Steps  Mild Impairment    Total Score  19    DGI comment:  No device today;  Initial DGI 11/24 with cane                           PT Education - 11/07/17 1130    Education Details  see pt instructions for UE stretches added    Person(s) Educated  Patient    Methods  Explanation;Demonstration;Verbal cues;Tactile cues    Comprehension  Verbalized understanding;Returned demonstration       PT Short Term Goals - 09/24/17 1021      PT SHORT TERM GOAL #1   Title  Patient verbalizes & demonstrates understanding of initial HEP. (All STGs Target Date: 09/21/2017)    Baseline  MET 09/19/2017    Time  4    Period  Weeks    Status  Achieved      PT SHORT TERM GOAL #2   Title  Patient ambulates 500' with walking sticks on indoor & paved surfaces with supervision.     Time  4    Period  Weeks    Status  Achieved      PT SHORT TERM GOAL #3   Title  Patient negotiates ramps & curbs with walking sticks with supervision.     Time  4    Period  Weeks    Status  Achieved      PT SHORT  TERM GOAL #4   Title  Berg Balance >/= 41/56    Baseline  51/56 on 9/16    Time  4    Period  Weeks    Status  Achieved        PT Long Term Goals - 11/07/17 1846      PT LONG TERM  GOAL #1   Title  Patient demonstrates & verbalizes understanding of ongoing HEP & fitness plan including community resources. (All LTGs Target Date: 11/09/2017)    Baseline  --    Time  8    Period  Weeks    Status  On-going    Target Date  11/09/17      PT LONG TERM GOAL #2   Title  Patient reports 50% improvement in right arm pain.     Baseline  MET 11/07/2017    Time  8    Period  Weeks    Status  Achieved      PT LONG TERM GOAL #3   Title  Patient ambulates 1000' with cane or walking sticks outdoors on paved & grass surfaces, ramps & curbs modified independent.     Time  8    Period  Weeks    Status  On-going      PT LONG TERM GOAL #4   Title  Patient ambulates around furniture simulating household gait without device modified independent.     Baseline  MET 11/07/2017    Time  8    Period  Weeks    Status  Achieved      PT LONG TERM GOAL #5   Title  Berg Balance >53/56 to indicate lower fall risk.     Baseline  MET 11/07/2017  Berg Balance 53/56    Time  8    Period  Weeks    Status  Achieved      PT LONG TERM GOAL #6   Title  Dynamic Gait Index with cane >/= 18/24 to indicate lower fall risk.   current score is 11/24   Baseline  MET 11/07/2017  DGI without device 19/24    Time  8    Period  Weeks    Status  Achieved            Plan - 11/07/17 1853    Clinical Impression Statement  Patient met 5 LTGs checked today. Her elbow pain has improved to intermittent 1-2/10 and reports how to address. Patient improved Berg Balance to 53/56 from 37/56 which indicates lower fall risk.  Functional Gait Assessment improved to 19/24 without device from 11/24 with cane also indicating lower fall risk.     Rehab Potential  Good    Clinical Impairments Affecting Rehab Potential  MS with fatigue limitations,     PT Frequency  2x / week    PT Duration  8 weeks    PT Treatment/Interventions  ADLs/Self Care Home Management;Canalith Repostioning;Ultrasound;DME Instruction;Gait  training;Stair training;Functional mobility training;Therapeutic activities;Therapeutic exercise;Balance training;Neuromuscular re-education;Patient/family education;Manual techniques;Vestibular    PT Next Visit Plan  checking remaining 2 LTGs and discharge    PT Home Exercise Plan  4CMFLLAR & [id number]    [id number] and Agree with Plan of Care  Patient       Patient will benefit from skilled therapeutic intervention in order to improve the following deficits and impairments:  Abnormal gait, Decreased activity tolerance, Decreased balance, Decreased endurance, Decreased knowledge of use of DME, Decreased mobility, Decreased strength, Dizziness, Pain  Visit Diagnosis: Muscle weakness (  generalized)  Unsteadiness on feet  Other abnormalities of gait and mobility     Problem List Patient Active Problem List   Diagnosis Date Noted  . Establishing care with new doctor, encounter for 02/12/2017  . Lyme disease 11/03/2014  . Hypothyroidism (acquired) 11/03/2014  . Other fatigue 11/03/2014  . MULTIPLE SCLEROSIS 09/01/2008    Jamey Reas PT, DPT 11/08/2017, 8:58 AM  [facility] [address] [place], [place], [address] Phone: [phone]   Fax:  [phone]  Name: NICHOLE NEYER MRN: [id number] Date of Birth: [birth year]

## 2017-11-08 NOTE — Patient Instructions (Signed)
Added UE stretches to HEP Wrist flexor prayer stretch, reverse prayer wrist extensor stretch, finger flicks forward & overhead. Good with tightness from typing. Triceps stretch reaching hand behind back/shoulder and using other hand to push elbow back until feel stretch AND using towel for stretch.  Discussed stretches sitting in car at red light using head rest & car ceiling. Pt verbalized understanding.

## 2017-11-09 ENCOUNTER — Encounter: Payer: Self-pay | Admitting: Physical Therapy

## 2017-11-09 ENCOUNTER — Ambulatory Visit: Payer: No Typology Code available for payment source | Attending: Family Medicine | Admitting: Physical Therapy

## 2017-11-09 DIAGNOSIS — R2689 Other abnormalities of gait and mobility: Secondary | ICD-10-CM | POA: Diagnosis present

## 2017-11-09 DIAGNOSIS — R2681 Unsteadiness on feet: Secondary | ICD-10-CM

## 2017-11-09 NOTE — Therapy (Signed)
[facility] [address] [place], [place], [address] Phone: [phone]   Fax:  [phone]  Physical Therapy Treatment and Discharge Summary  Patient Details  Name: Samantha Church MRN: [id number] Date of Birth: Sep 17, 1960 Referring Provider (PT): Arlice Colt, MD   Encounter Date: 11/09/2017  PT End of Session - 11/09/17 2017    Visit Number  14    Number of Visits  17    Date for PT Re-Evaluation  11/09/17    Authorization Type  [employer] visit limit 23 Speciality Services required $30 co-pay    Authorization - Visit Number  10    Authorization - Number of Visits  23    PT Start Time  0845    PT Stop Time  0924    PT Time Calculation (min)  39 min    Activity Tolerance  Patient tolerated treatment well    Behavior During Therapy  [facility] for tasks assessed/performed       Past Medical History:  Diagnosis Date  . Anemia   . Hypothyroidism    Dr. [name]  . Lyme disease   . Multiple sclerosis ([place])   . Neuromuscular disorder ([place])    MS  . Vision abnormalities     Past Surgical History:  Procedure Laterality Date  . BREAST SURGERY  2002  . COLONOSCOPY  2008  . DILATATION & CURRETTAGE/HYSTEROSCOPY WITH RESECTOCOPE N/A 10/09/2012   Procedure: DILATATION & CURETTAGE/HYSTEROSCOPY WITH RESECTOCOPE;  Surgeon: [name], MD;  Location: [facility];  Service: Gynecology;  Laterality: N/A;  RESECTION with Versapoint   . fibroid  2019   fibroidectomy  . KNEE ARTHROSCOPY  1996  . KNEE ARTHROSCOPY  1996    There were no vitals filed for this visit.  Subjective Assessment - 11/09/17 0850    Subjective  Her plan is to go to the gym with a trainer for at least 4 weeks and then on her own. I know I have to keep working on my leg strength and balance.     Pertinent History  relapsing MS, hypothyroidism, L knee arthroscopy, Lyme disease    Limitations  Lifting;Standing;Walking;House hold activities    Patient Stated Goals   To build strength to walk without support and learn proper support if needed. Learned exercises    Currently in Pain?  No/denies   elbow only hurts when cutting veggies, meat etc;    Pain Onset  --                       [facility] Adult PT Treatment/Exercise - 11/09/17 0904      Ambulation/Gait   Ambulation/Gait Assistance  6: Modified independent (Device/Increase time)    Ambulation/Gait Assistance Details  outdoors with walking poles    Ambulation Distance (Feet)  1200 Feet    Assistive device  Other (Comment)   trekking poles   Gait Pattern  Within Functional Limits    Ambulation Surface  Outdoor;Grass;Unlevel    Ramp  6: Modified independent (Device)    Curb  6: Modified independent (Device/increase time)    Gait Comments  unlevel sidewalk, broken sidewalk          Balance Exercises - 11/09/17 0926      Balance Exercises: Standing   Tandem Stance  Eyes open;Foam/compliant surface;Intermittent upper extremity support;2 reps;30 secs    Rockerboard  Anterior/posterior;Head turns;EO;EC;Intermittent UE support   hip strategy to "rock" board each direction, hold 3 sec x 10  Sidestepping  Foam/compliant support;4 reps    Lift / Chop Limitations  on rockerboard with eyes closed, pt automatically using hip strategy to help her recover    Other Standing Exercises  rockerboard alternating step off/on with each leg each direction x 5          PT Short Term Goals - 09/24/17 1021      PT SHORT TERM GOAL #1   Title  Patient verbalizes & demonstrates understanding of initial HEP. (All STGs Target Date: 09/21/2017)    Baseline  MET 09/19/2017    Time  4    Period  Weeks    Status  Achieved      PT SHORT TERM GOAL #2   Title  Patient ambulates 500' with walking sticks on indoor & paved surfaces with supervision.     Time  4    Period  Weeks    Status  Achieved      PT SHORT TERM GOAL #3   Title  Patient negotiates ramps & curbs with walking sticks with supervision.      Time  4    Period  Weeks    Status  Achieved      PT SHORT TERM GOAL #4   Title  Berg Balance >/= 41/56    Baseline  51/56 on 9/16    Time  4    Period  Weeks    Status  Achieved        PT Long Term Goals - 11/09/17 2021      PT LONG TERM GOAL #1   Title  Patient demonstrates & verbalizes understanding of ongoing HEP & fitness plan including community resources. (All LTGs Target Date: 11/09/2017)    Time  8    Period  Weeks    Status  Achieved      PT LONG TERM GOAL #2   Title  Patient reports 50% improvement in right arm pain.     Baseline  MET 11/07/2017    Time  8    Period  Weeks    Status  Achieved      PT LONG TERM GOAL #3   Title  Patient ambulates 1000' with cane or walking sticks outdoors on paved & grass surfaces, ramps & curbs modified independent.     Baseline  1200' bil walking sticks    Time  8    Period  Weeks    Status  Achieved      PT LONG TERM GOAL #4   Title  Patient ambulates around furniture simulating household gait without device modified independent.     Baseline  MET 11/07/2017    Time  8    Period  Weeks    Status  Achieved      PT LONG TERM GOAL #5   Title  Berg Balance >53/56 to indicate lower fall risk.     Baseline  MET 11/07/2017  Berg Balance 53/56    Time  8    Period  Weeks    Status  Achieved      PT LONG TERM GOAL #6   Title  Dynamic Gait Index with cane >/= 18/24 to indicate lower fall risk.   current score is 11/24   Baseline  MET 11/07/2017  DGI without device 19/24    Time  8    Period  Weeks    Status  Achieved            Plan - 11/09/17 2019  Clinical Impression Statement  Final LTGs assessed today (outdoor ambulation and able to verbalize her plan for continued exercise upon d/c from PT) with pt meeting and exceeding her goals. Remainder of session worked on Hotel manager, especially eliciting her hip strategy via use of compliant surfaces. Patient very pleased with her progress and all she has  learned with PT. she agrees with discharge this date.     Rehab Potential  Good    Clinical Impairments Affecting Rehab Potential  MS with fatigue limitations,     PT Frequency  2x / week    PT Duration  8 weeks    PT Treatment/Interventions  ADLs/Self Care Home Management;Canalith Repostioning;Ultrasound;DME Instruction;Gait training;Stair training;Functional mobility training;Therapeutic activities;Therapeutic exercise;Balance training;Neuromuscular re-education;Patient/family education;Manual techniques;Vestibular    PT Home Exercise Plan  [id number] & [id number]    Consulted and Agree with Plan of Care  Patient       Patient will benefit from skilled therapeutic intervention in order to improve the following deficits and impairments:  Abnormal gait, Decreased activity tolerance, Decreased balance, Decreased endurance, Decreased knowledge of use of DME, Decreased mobility, Decreased strength, Dizziness, Pain  Visit Diagnosis: Unsteadiness on feet  Other abnormalities of gait and mobility     Problem List Patient Active Problem List   Diagnosis Date Noted  . Establishing care with new doctor, encounter for 02/12/2017  . Lyme disease 11/03/2014  . Hypothyroidism (acquired) 11/03/2014  . Other fatigue 11/03/2014  . MULTIPLE SCLEROSIS 09/01/2008   PHYSICAL THERAPY DISCHARGE SUMMARY  Visits from Start of Care: 14  Current functional level related to goals / functional outcomes: See LTGs above   Remaining deficits: LE and trunk weakness sue to MS   Education / Equipment: HEP; gait with walking poles  Plan: Patient agrees to discharge.  Patient goals were partially met. Patient is being discharged due to meeting the stated rehab goals.  ?????       Rexanne Mano, PT 11/09/2017, 8:24 PM  [facility] [address] [place], [place], [address] Phone: [phone]   Fax:  [phone]  Name: Samantha Church MRN:  [id number] Date of Birth: 11/29/1960

## 2017-11-21 ENCOUNTER — Encounter: Payer: Self-pay | Admitting: Family Medicine

## 2017-11-21 ENCOUNTER — Ambulatory Visit (INDEPENDENT_AMBULATORY_CARE_PROVIDER_SITE_OTHER): Payer: No Typology Code available for payment source | Admitting: Family Medicine

## 2017-11-21 VITALS — BP 110/80 | HR 76 | Temp 97.5°F | Ht 65.5 in | Wt 142.8 lb

## 2017-11-21 DIAGNOSIS — N898 Other specified noninflammatory disorders of vagina: Secondary | ICD-10-CM

## 2017-11-21 DIAGNOSIS — E039 Hypothyroidism, unspecified: Secondary | ICD-10-CM | POA: Diagnosis not present

## 2017-11-21 DIAGNOSIS — Z1159 Encounter for screening for other viral diseases: Secondary | ICD-10-CM

## 2017-11-21 DIAGNOSIS — R5383 Other fatigue: Secondary | ICD-10-CM | POA: Diagnosis not present

## 2017-11-21 DIAGNOSIS — G35 Multiple sclerosis: Secondary | ICD-10-CM

## 2017-11-21 DIAGNOSIS — Z0000 Encounter for general adult medical examination without abnormal findings: Secondary | ICD-10-CM | POA: Insufficient documentation

## 2017-11-21 DIAGNOSIS — Z01419 Encounter for gynecological examination (general) (routine) without abnormal findings: Secondary | ICD-10-CM | POA: Diagnosis not present

## 2017-11-21 LAB — CBC WITH DIFFERENTIAL/PLATELET
BASOS PCT: 0.7 %
Basophils Absolute: 29 cells/uL (ref 0–200)
EOS PCT: 0.9 %
Eosinophils Absolute: 38 cells/uL (ref 15–500)
HCT: 40.3 % (ref 35.0–45.0)
Hemoglobin: 14 g/dL (ref 11.7–15.5)
Lymphs Abs: 1436 cells/uL (ref 850–3900)
MCH: 31.4 pg (ref 27.0–33.0)
MCHC: 34.7 g/dL (ref 32.0–36.0)
MCV: 90.4 fL (ref 80.0–100.0)
MPV: 10 fL (ref 7.5–12.5)
Monocytes Relative: 10.6 %
Neutro Abs: 2251 cells/uL (ref 1500–7800)
Neutrophils Relative %: 53.6 %
PLATELETS: 280 10*3/uL (ref 140–400)
RBC: 4.46 10*6/uL (ref 3.80–5.10)
RDW: 12.6 % (ref 11.0–15.0)
TOTAL LYMPHOCYTE: 34.2 %
WBC: 4.2 10*3/uL (ref 3.8–10.8)
WBCMIX: 445 {cells}/uL (ref 200–950)

## 2017-11-21 LAB — COMPREHENSIVE METABOLIC PANEL
ALBUMIN: 5.2 g/dL (ref 3.5–5.2)
ALK PHOS: 51 U/L (ref 39–117)
ALT: 15 U/L (ref 0–35)
AST: 18 U/L (ref 0–37)
BUN: 9 mg/dL (ref 6–23)
CHLORIDE: 102 meq/L (ref 96–112)
CO2: 28 mEq/L (ref 19–32)
CREATININE: 0.55 mg/dL (ref 0.40–1.20)
Calcium: 9.9 mg/dL (ref 8.4–10.5)
GFR: 121.21 mL/min (ref >60.00)
Glucose, Bld: 95 mg/dL (ref 70–99)
Potassium: 3.6 mEq/L (ref 3.5–5.1)
SODIUM: 141 meq/L (ref 135–145)
TOTAL PROTEIN: 8 g/dL (ref 6.0–8.3)
Total Bilirubin: 0.5 mg/dL (ref 0.2–1.2)

## 2017-11-21 LAB — LIPID PANEL
CHOLESTEROL: 218 mg/dL — AB (ref 0–200)
HDL: 77 mg/dL (ref >39.00)
LDL Cholesterol: 127 mg/dL — ABNORMAL HIGH (ref 0–99)
NonHDL: 141.18
Total CHOL/HDL Ratio: 3
Triglycerides: 69 mg/dL (ref 0.0–149.0)
VLDL: 13.8 mg/dL (ref 0.0–40.0)

## 2017-11-21 NOTE — Addendum Note (Signed)
Addended by: Diona Foley on: 11/21/2017 10:52 AM   Modules accepted: Orders

## 2017-11-21 NOTE — Patient Instructions (Signed)
Great to see you. I will call you with your lab results from today and you can view them online.   Please sign a release form to get records Dr. Orvan Seen.

## 2017-11-21 NOTE — Progress Notes (Signed)
Subjective:   Patient ID: Samantha Church, female    DOB: April 21, 1960, 57 y.o.   MRN: [id number]  Samantha Church is a pleasant 57 y.o. year old female who presents to clinic today with Annual Exam (pt here for annual physical . pt sts she has been feeling good. , pt wants a pap today and is having vaginal discharge, pt does not remember last hep c.  She agrees to lab for Hep-C and Cologuard.  She also agrees to flu shot.)  on 11/21/2017  HPI:  Health Maintenance  Topic Date Due  . Hepatitis C Screening  January 08, 1961  . Fecal DNA (Cologuard)  03/09/2011  . PAP SMEAR  06/21/2017  . INFLUENZA VACCINE  08/09/2017  . MAMMOGRAM  10/10/2018  . TETANUS/TDAP  11/23/2020  . HIV Screening  Completed   Mammogram was within a year at GYN.  Established care with me on 02/12/17- note reviewed.  Multiple Sclerosis- followed by Dr. Felecia Shelling. Last seen on 10/24/16. Note reviewed.  Advised to continue Tecfidera and Ampyra. He also discussed that some studies have shown benefit of biotin and alpha lipoic acid in slowing the progression of MS.  Hypothyroidism Followed by endo.  Takes synthroid 75 mcg daily. Denies any symptoms of hypo or hyperthyroidism. Lab Results  Component Value Date   TSH 0.70 05/07/2017   Lab Results  Component Value Date   CHOL 208 (A) 06/27/2016   HDL 81 (A) 06/27/2016   LDLCALC 117 06/27/2016   TRIG 67 06/27/2016   Current Outpatient Medications on File Prior to Visit  Medication Sig Dispense Refill  . Alpha-Lipoic Acid 600 MG TABS     . Ascorbic Acid 500 MG/15ML SYRP Take by mouth.    . Biotin 100 MG/GM POWD     . Cholecalciferol (VITAMIN D-3) 5000 units TABS Take 1 tablet by mouth daily.    . Dimethyl Fumarate (TECFIDERA) 240 MG CPDR Take 240 mg by mouth 2 (two) times daily.    Marland Kitchen levothyroxine (SYNTHROID, LEVOTHROID) 75 MCG tablet TK 1 T PO D OES BEFORE BREAKFAST    . magnesium 30 MG tablet Take 30 mg by mouth 4 (four) times daily.     . NP THYROID 60 MG tablet Take 1  tablet (60 mg total) by mouth daily before breakfast. 90 tablet 3  . Nutritional Supplements (OPTICLEANSE GHI PO) Take by mouth.     Current Facility-Administered Medications on File Prior to Visit  Medication Dose Route Frequency Provider Last Rate Last Dose  . gadopentetate dimeglumine (MAGNEVIST) injection 13 mL  13 mL Intravenous Once PRN Sater, Richard A, MD      . gadopentetate dimeglumine (MAGNEVIST) injection 13 mL  13 mL Intravenous Once PRN Sater, Richard A, MD      . gadopentetate dimeglumine (MAGNEVIST) injection 13 mL  13 mL Intravenous Once PRN Sater, Nanine Means, MD        No Known Allergies  Past Medical History:  Diagnosis Date  . Anemia   . Hypothyroidism    Dr. [name]  . Lyme disease   . Multiple sclerosis ([place])   . Neuromuscular disorder ([place])    MS  . Vision abnormalities     Past Surgical History:  Procedure Laterality Date  . BREAST SURGERY  2002  . COLONOSCOPY  2008  . DILATATION & CURRETTAGE/HYSTEROSCOPY WITH RESECTOCOPE N/A 10/09/2012   Procedure: DILATATION & CURETTAGE/HYSTEROSCOPY WITH RESECTOCOPE;  Surgeon: [name], MD;  Location: [facility];  Service: Gynecology;  Laterality: N/A;  RESECTION with Versapoint   . fibroid  2019   fibroidectomy  . KNEE ARTHROSCOPY  1996  . KNEE ARTHROSCOPY  1996    Family History  Problem Relation Age of Onset  . Hypertension Mother   . Arthritis Mother   . Colon cancer Father   . Thyroid cancer Sister   . Dementia Brother        Early  . Heart attack Maternal Grandmother   . Hypertension Maternal Grandmother   . CAD Maternal Grandmother   . Diabetes Maternal Grandfather   . Lung cancer Maternal Grandfather   . Alzheimer's disease Paternal Grandmother   . Hypertension Paternal Grandmother   . Hyperlipidemia Paternal Grandmother   . Diabetes Paternal Grandfather   . Asthma Brother   . Thyroid cancer Brother     Social History   Socioeconomic History  . Marital status: Married    Spouse name:  Not on file  . Number of children: Not on file  . Years of education: Not on file  . Highest education level: Not on file  Occupational History  . Not on file  Social Needs  . Financial resource strain: Not on file  . Food insecurity:    Worry: Not on file    Inability: Not on file  . Transportation needs:    Medical: Not on file    Non-medical: Not on file  Tobacco Use  . Smoking status: Never Smoker  . Smokeless tobacco: Never Used  Substance and Sexual Activity  . Alcohol use: Yes    Comment: 2x month  . Drug use: No  . Sexual activity: Not on file  Lifestyle  . Physical activity:    Days per week: Not on file    Minutes per session: Not on file  . Stress: Not on file  Relationships  . Social connections:    Talks on phone: Not on file    Gets together: Not on file    Attends religious service: Not on file    Active member of club or organization: Not on file    Attends meetings of clubs or organizations: Not on file    Relationship status: Not on file  . Intimate partner violence:    Fear of current or ex partner: Not on file    Emotionally abused: Not on file    Physically abused: Not on file    Forced sexual activity: Not on file  Other Topics Concern  . Not on file  Social History Narrative  . Not on file   The PMH, PSH, Social History, Family History, Medications, and allergies have been reviewed in [facility], and have been updated if relevant.   Review of Systems  Constitutional: Negative.   HENT: Negative.   Eyes: Negative.   Respiratory: Negative.   Cardiovascular: Negative.   Gastrointestinal: Negative.   Endocrine: Negative.   Genitourinary: Positive for vaginal discharge. Negative for decreased urine volume, difficulty urinating, dyspareunia, dysuria, enuresis, flank pain, frequency, genital sores, hematuria, menstrual problem, pelvic pain, urgency and vaginal bleeding.  Musculoskeletal: Negative.   Allergic/Immunologic: Negative.   Neurological:  Negative.   Hematological: Negative.   Psychiatric/Behavioral: Negative.   All other systems reviewed and are negative.      Objective:    BP 110/80 (BP Location: Right Arm, Patient Position: Sitting, Cuff Size: Normal)   Pulse 76   Temp (!) 97.5 F (36.4 C) (Oral)   Ht 5' 5.5" (1.664 m)   Wt 142 lb 12.8 oz (64.8  kg)   SpO2 100%   BMI 23.40 kg/m    Physical Exam   General:  Well-developed,well-nourished,in no acute distress; alert,appropriate and cooperative throughout examination Head:  normocephalic and atraumatic.   Eyes:  vision grossly intact, PERRL Ears:  R ear normal and L ear normal externally, TMs clear bilaterally Nose:  no external deformity.   Mouth:  good dentition.   Neck:  No deformities, masses, or tenderness noted. Breasts:  No mass, nodules, thickening, tenderness, bulging, retraction, inflamation, nipple discharge or skin changes noted.   Lungs:  Normal respiratory effort, chest expands symmetrically. Lungs are clear to auscultation, no crackles or wheezes. Heart:  Normal rate and regular rhythm. S1 and S2 normal without gallop, murmur, click, rub or other extra sounds. Abdomen:  Bowel sounds positive,abdomen soft and non-tender without masses, organomegaly or hernias noted. Rectal:  no external abnormalities.   Genitalia:  Pelvic Exam:        External: normal female genitalia without lesions or masses        Vagina: normal without lesions or masses        Cervix: normal without lesions or masses        Adnexa: normal bimanual exam without masses or fullness        Uterus: normal by palpation        Pap smear: performed Msk:  No deformity or scoliosis noted of thoracic or lumbar spine.   Extremities:  No clubbing, cyanosis, edema, or deformity noted with normal full range of motion of all joints.   Neurologic:  alert & oriented X3 and gait normal.   Skin:  Intact without suspicious lesions or rashes Cervical Nodes:  No lymphadenopathy noted Axillary  Nodes:  No palpable lymphadenopathy Psych:  Cognition and judgment appear intact. Alert and cooperative with normal attention span and concentration. No apparent delusions, illusions, hallucinations       Assessment & Plan:   Well woman exam with routine gynecological exam - Plan: Cytology - PAP  Other fatigue  MULTIPLE SCLEROSIS  Hypothyroidism (acquired) - Plan: CBC with Differential/Platelet, Comprehensive metabolic panel, Lipid panel, TSH  Encounter for hepatitis C screening test for low risk patient - Plan: Hepatitis C Antibody  Vaginal discharge - Plan: Cytology - PAP No follow-ups on file.

## 2017-11-21 NOTE — Addendum Note (Signed)
Addended by: Marrion Coy on: 11/21/2017 12:28 PM   Modules accepted: Orders

## 2017-11-21 NOTE — Assessment & Plan Note (Addendum)
Reviewed preventive care protocols, scheduled due services, and updated immunizations Discussed nutrition, exercise, diet, and healthy lifestyle. Pap smear done today. Influenza vaccine given.

## 2017-11-21 NOTE — Assessment & Plan Note (Signed)
Followed by Endo 

## 2017-11-22 LAB — HEPATITIS C ANTIBODY
Hepatitis C Ab: NONREACTIVE
SIGNAL TO CUT-OFF: 0.01 (ref <1.00)

## 2017-11-23 ENCOUNTER — Telehealth: Payer: Self-pay | Admitting: Family Medicine

## 2017-11-23 NOTE — Telephone Encounter (Signed)
FYI

## 2017-11-23 NOTE — Telephone Encounter (Signed)
Noted.  I Samantha Church mentioned this to me and the patient is aware.

## 2017-11-23 NOTE — Telephone Encounter (Signed)
Copied from [place] [phone]. Topic: Quick Communication - See Telephone Encounter >> Nov 23, 2017  9:25 AM Conception Chancy, NT wrote: CRM for notification. See Telephone encounter for: 11/23/17.  Samantha Church is calling from [facility] and states that in there additional notes it states to test for Bv. She states she can not test for BV on the thin prep val but will perform the pap. She just wanted the office to be aware.

## 2017-11-26 LAB — PAP IG W/ RFLX HPV ASCU

## 2018-04-10 ENCOUNTER — Encounter: Payer: Self-pay | Admitting: Internal Medicine

## 2018-05-07 ENCOUNTER — Encounter: Payer: Self-pay | Admitting: Internal Medicine

## 2018-05-08 ENCOUNTER — Other Ambulatory Visit: Payer: Self-pay

## 2018-05-08 ENCOUNTER — Encounter: Payer: Self-pay | Admitting: Internal Medicine

## 2018-05-08 ENCOUNTER — Ambulatory Visit (INDEPENDENT_AMBULATORY_CARE_PROVIDER_SITE_OTHER): Payer: No Typology Code available for payment source | Admitting: Internal Medicine

## 2018-05-08 DIAGNOSIS — E039 Hypothyroidism, unspecified: Secondary | ICD-10-CM | POA: Diagnosis not present

## 2018-05-08 NOTE — Progress Notes (Signed)
Patient ID: Samantha Church, female   DOB: 07/19/1960, 58 y.o.   MRN: [id number]   Patient location: Home My location: Office  Referring Provider: Lucille Passy, MD  I connected with the patient on 05/08/18 at  8:00 AM EDT by a video enabled telemedicine application and verified that I am speaking with the correct person.   I discussed the limitations of evaluation and management by telemedicine and the availability of in person appointments. The patient expressed understanding and agreed to proceed.   Details of the encounter are shown below.  HPI  Samantha Church is a 58 y.o.-year-old female, presenting for follow-up for hypothyroidism.  Last visit 1 year ago  Pt. has been dx with hypothyroidism in ~2013 >> on Synthroid, then changed to NP thyroid 60 mg daily at [facility] ([facility]).   Pt takes the NP thyroid: - in am - fasting - + coffee + butter at least 30 minutes later - at least 30 min from b'fast - no Ca, Fe, MVI, PPIs - On high-dose D-biotin (100,000 mcg bid) On vitamin C, D, Mg, Opticleanse, ALA (also for MS), NRF2 activator, Turmeric.  Reviewed patient's TFTs, Lab Results  Component Value Date   TSH 0.70 05/07/2017   TSH 0.75 11/07/2016   TSH 0.88 06/27/2016   TSH 1.150 05/14/2015   TSH 1.87 11/24/2010   FREET4 0.78 05/07/2017   FREET4 0.85 11/07/2016    Investigation for Hashimoto's thyroiditis was negative Component     Latest Ref Rng & Units 11/07/2016  Thyroglobulin Ab     < or = 1 IU/mL <1  Thyroperoxidase Ab SerPl-aCnc     <9 IU/mL <1   She still has -Fatigue -Heart flashes -Occasional palpitations  Pt denies: - feeling nodules in neck - hoarseness - dysphagia - choking - SOB with lying down  Thyroid U.S (05/07/2007) report reviewed, normal.  She has no FH of hypo-or hyperthyroidism but she does have + FH of thyroid cancer in brother and sister: PTC.No h/o radiation tx to head or neck.  No herbal supplements. + Biotin use. No  recent steroids use.   She also has a history of MS.  ROS: Constitutional: no weight gain/no weight loss, + fatigue, + hot flushes, no subjective hypothermia Eyes: no blurry vision, no xerophthalmia ENT: no sore throat, + see HPI Cardiovascular: no CP/no SOB/+ rarely palpitations/no leg swelling Respiratory: no cough/no SOB/no wheezing Gastrointestinal: no N/no V/no D/no C/no acid reflux Musculoskeletal: no muscle aches/no joint aches Skin: no rashes, no hair loss Neurological: no tremors/no numbness/no tingling/no dizziness  I reviewed pt's medications, allergies, PMH, social hx, family hx, and changes were documented in the history of present illness. Otherwise, unchanged from my initial visit note.  Past Medical History:  Diagnosis Date  . Anemia   . Hypothyroidism    Dr. [name]  . Lyme disease   . Multiple sclerosis ([place])   . Neuromuscular disorder ([place])    MS  . Vision abnormalities    Past Surgical History:  Procedure Laterality Date  . BREAST SURGERY  2002  . COLONOSCOPY  2008  . DILATATION & CURRETTAGE/HYSTEROSCOPY WITH RESECTOCOPE N/A 10/09/2012   Procedure: DILATATION & CURETTAGE/HYSTEROSCOPY WITH RESECTOCOPE;  Surgeon: [name], MD;  Location: [facility];  Service: Gynecology;  Laterality: N/A;  RESECTION with Versapoint   . fibroid  2019   fibroidectomy  . KNEE ARTHROSCOPY  1996  . KNEE ARTHROSCOPY  1996   Social History   Social History  .  Marital status: Married    Spouse name: N/A  . Number of children: 4   Occupational History  . Accounting   Social History Main Topics  . Smoking status: Never Smoker  . Smokeless tobacco: Never Used  . Alcohol use Yes     Comment: wine, nightly  . Drug use: No   Current Outpatient Medications on File Prior to Visit  Medication Sig Dispense Refill  . Alpha-Lipoic Acid 600 MG TABS     . Ascorbic Acid 500 MG/15ML SYRP Take by mouth.    . Biotin 100 MG/GM POWD     . Cholecalciferol (VITAMIN D-3) 5000  units TABS Take 1 tablet by mouth daily.    . Dimethyl Fumarate (TECFIDERA) 240 MG CPDR Take 240 mg by mouth 2 (two) times daily.    Marland Kitchen levothyroxine (SYNTHROID, LEVOTHROID) 75 MCG tablet TK 1 T PO D OES BEFORE BREAKFAST    . magnesium 30 MG tablet Take 30 mg by mouth 4 (four) times daily.     . NP THYROID 60 MG tablet Take 1 tablet (60 mg total) by mouth daily before breakfast. 90 tablet 3  . Nutritional Supplements (OPTICLEANSE GHI PO) Take by mouth.     Current Facility-Administered Medications on File Prior to Visit  Medication Dose Route Frequency Provider Last Rate Last Dose  . gadopentetate dimeglumine (MAGNEVIST) injection 13 mL  13 mL Intravenous Once PRN Sater, Richard A, MD      . gadopentetate dimeglumine (MAGNEVIST) injection 13 mL  13 mL Intravenous Once PRN Sater, Richard A, MD      . gadopentetate dimeglumine (MAGNEVIST) injection 13 mL  13 mL Intravenous Once PRN Sater, Nanine Means, MD       No Known Allergies Family History  Problem Relation Age of Onset  . Hypertension Mother   . Arthritis Mother   . Colon cancer Father   . Thyroid cancer Sister   . Dementia Brother        Early  . Heart attack Maternal Grandmother   . Hypertension Maternal Grandmother   . CAD Maternal Grandmother   . Diabetes Maternal Grandfather   . Lung cancer Maternal Grandfather   . Alzheimer's disease Paternal Grandmother   . Hypertension Paternal Grandmother   . Hyperlipidemia Paternal Grandmother   . Diabetes Paternal Grandfather   . Asthma Brother   . Thyroid cancer Brother    Also, DM in PGF; HL, heart ds in M.  PE: There were no vitals taken for this visit. Wt Readings from Last 3 Encounters:  11/21/17 142 lb 12.8 oz (64.8 kg)  05/07/17 146 lb 3.2 oz (66.3 kg)  02/12/17 144 lb 6.4 oz (65.5 kg)   Constitutional:  in NAD  The physical exam was not performed (virtual visit).  ASSESSMENT: 1. Acquired Hypothyroidism  PLAN:  1. Patient with longstanding nonautoimmune  hypothyroidism, on desiccated thyroid extract - latest thyroid labs reviewed with pt >> normal at last visit - she continues on NP thyroid 60 mg daily - pt feels good on this dose, but continues to have occasional menopausal hot flashes and some fatigue mostly in the pm (? If from [place]) - we discussed about taking the thyroid hormone every day, with water, >30 minutes before breakfast, separated by >4 hours from acid reflux medications, calcium, iron, multivitamins. Pt. is taking it correctly. - will check thyroid tests when safe to come to the clinic (coronavirus pandemic): TSH, free T3, and fT4 - If labs are abnormal, she will need  to return for repeat TFTs in 1.5 months - since last visit, she started biotin for MS (a very high dose) -we discussed that she will need to be off high-dose biotin for 2 weeks before labs - Return to clinic in 1 year or sooner, for labs, if needed  Orders Placed This Encounter  Procedures  . TSH  . T4, free  . T3, free   - time spent with the patient: 15 min, of which >50% was spent in obtaining information about her symptoms, reviewing her previous labs, evaluations, and treatments, counseling her about her condition (please see the discussed topics above), and developing a plan to further investigate and treat it.  Philemon Kingdom, MD PhD [facility] Endocrinology

## 2018-05-08 NOTE — Patient Instructions (Signed)
Please continue NP thyroid 60 mg daily.  Take the thyroid hormone every day, with water, at least 30 minutes before breakfast, separated by at least 4 hours from: - acid reflux medications - calcium - iron - multivitamins  Please come back for labs when safe, after being off Biotin for at least 2 weeks.  Please come back for a follow-up appointment in 1 year.

## 2018-06-14 ENCOUNTER — Other Ambulatory Visit: Payer: No Typology Code available for payment source

## 2018-07-24 ENCOUNTER — Telehealth: Payer: Self-pay | Admitting: Neurology

## 2018-07-24 NOTE — Telephone Encounter (Signed)
Called pt back. Advised since she was last seen in 2018 she will need to have a f/u with MD prior to getting refill. Scheduled appt for [facility] to check in at 830am, wear a mask and temp will be checked prior to coming back for appt. Advised she is allowed one person back with her. If they have any sx of covid-19 or been exposed to anyone with covid-19 they should let us know prior to coming to appt. She verbalized understanding.  cx appt made in August 2020.

## 2018-07-24 NOTE — Telephone Encounter (Signed)
Pt is requesting a refill of Dimethyl Fumarate (TECFIDERA) 240 MG CPDR , to be sent to [facility]   Fax # [phone]  Please include Order # [id number]

## 2018-07-25 ENCOUNTER — Telehealth: Payer: Self-pay | Admitting: *Deleted

## 2018-07-25 ENCOUNTER — Ambulatory Visit (INDEPENDENT_AMBULATORY_CARE_PROVIDER_SITE_OTHER): Payer: No Typology Code available for payment source | Admitting: Neurology

## 2018-07-25 ENCOUNTER — Other Ambulatory Visit: Payer: Self-pay

## 2018-07-25 VITALS — BP 110/75 | HR 64 | Temp 97.8°F | Ht 66.0 in | Wt 145.5 lb

## 2018-07-25 DIAGNOSIS — R5383 Other fatigue: Secondary | ICD-10-CM

## 2018-07-25 DIAGNOSIS — G35 Multiple sclerosis: Secondary | ICD-10-CM | POA: Diagnosis not present

## 2018-07-25 DIAGNOSIS — R269 Unspecified abnormalities of gait and mobility: Secondary | ICD-10-CM | POA: Diagnosis not present

## 2018-07-25 DIAGNOSIS — Z79899 Other long term (current) drug therapy: Secondary | ICD-10-CM

## 2018-07-25 NOTE — Progress Notes (Addendum)
[facility]  PATIENT: Samantha Church DOB: 10-Mar-1960  REFERRING DOCTOR OR PCP:  Donald Prose SOURCE: Patient and records from [facility] neurology.  _________________________________   HISTORICAL  CHIEF COMPLAINT:  Chief Complaint  Patient presents with   Follow-up    RM 13, alone. Last seen in 2018. Here for MS f/u. Has not been following a different neurologist, she states she does not like to come to the doctor much.   Multiple Sclerosis    On Tecfidera. Having more balance issues, dizziness, dragging left leg more. Having more walking issues. Does not have brace for foot drop.    Gait Problem    Ambulates with cane    HISTORY OF PRESENT ILLNESS:  Samantha Church is a 58 year old woman with multiple sclerosis reporting more numbness and gait issues.    Update 07/25/2018: She is on Tecfidera and feels her gait is doing a little worse since the last visit.  The changes been gradual.  There has not been any definite exacerbation.  She tolerates the Tecfidera well..   She is doing exercises with PT.   Her left side is weaker than the right- arm, hand and leg.    She is noticing the left foot drop more.  She uses a cane to ambulate.  She has urinary frequency and urgency, about the same as last visit.    She notes some fatigue.  She notes cognitive issues, similar to last visit.  Mood is doing well.  She has been on Tecfidera for about 6 years.  Initially, she was on Betaseron and then she switched to Tysabri but became JCV positive and switched to [place].   She had exacerbation while on Gilenya and went back on the Tysabri and then went on Tecfidera.Marland Kitchen     Update 10/24/2016::   She notes that the left leg is not moving as well.    She thinks the onset was gradual and has continued to progress.    She feels she does not have complete control over the leg.   She is concerned it may buckle.     Her left side has always been the worse.   Though gait feels mildly  unsteady, she has no falls.    She is now using a cane for longer distances.   Bladder function is unchanged.  She notes some fatigue but this is stable. No change in cognition.  We reiviewed the MRI of the cervical spine from 04/26/2016.  She has multiple small lesions within the spinal cord including 3 on the left. When compared to the MRI from last year, there is no definite change.  _______________________________________ From 04/13/2016:  .   MS:  She is on Tecfidera and tolerates it well.   She switched to it from [place] in 2014 (was on Tysabri but converted to JCV Ab positive).      MRI of the brain performed October 2015 showed 1 focus not present on the December 2014 MRI.   2017 MRI of the brain was stable compared to 2016.   MRI of the cervical spine 2017 (after onset of numbness below neck) showed 4 foci (one not present in 2008) and MRi T-spine showed two foci, neither present in 2008.     Gait/strength:    Gait is worse and she notes more left sided clumsiness and weakness.   Balance is off.   She does not use a cane.    Legs has some spasticity which seems stable.    She feels she needs to look down to walk.     Gait is wider and slower.    Arms are fine as far as strength.   Balance is much worse with eyes closed.   She has trouble going downstairs and can't lead with the left leg.  She has trouble going upstairs if she has her hands full.  Numbness:   She is noting more numbness in both hands.  Numbness is below her neck bilaterally.       MS History:   She was diagnosed in 2007 after presenting with right sided numbness and mild clumsiness / allodynia.    MRI was consistent with MS and she was started with Betaseron.  She then switched to Copaxone but had exacerbations and switched to Tysabri.     She converted to Tysabri  but became JCV Ab positive and switched to Gilenya.   Due to exacerbations on Gilenya, she switched back to Tysabri x one year and then switched to Tecfidera (in 2014).     Bladder/Bowel:   She notes more urinary frequency and urgency.  No incontinence recently.  Fatigue/sleep:   She still has physical and cognitive fatigue.   She is sleepy around 2 pm and naps every afternoon x 1 hour.    She feels very refreshed after a one hour nap and does better the rest of the day.   She has Provigil but never took it because the nap helps.   She falls asleep easily at night and sleeps 8 hours.   (9-5 am) .  Mood/Cogntion:   She denies any depression or anxiety.   She denies major cognitive problems but she notes reduced attention and focus at times.  She has no verbal fluencies issues.      REVIEW OF SYSTEMS: Constitutional: No fevers, chills, sweats, or change in appetite.   She has fatigue Eyes: No visual changes, double vision, eye pain Ear, nose and throat: No hearing loss, ear pain, nasal congestion, sore throat Cardiovascular: No chest pain, palpitations Respiratory: No shortness of breath at rest or with exertion.   No wheezes GastrointestinaI: No nausea, vomiting, diarrhea, abdominal pain, fecal incontinence Genitourinary: No dysuria, urinary retention or frequency.  No nocturia. Musculoskeletal: No neck pain, back pain Integumentary: No rash, pruritus, skin lesions Neurological: as above Psychiatric: No depression at this time.  No anxiety Endocrine: She has hypothyroidism.  No palpitations, diaphoresis, change in appetite, change in weigh or increased thirst Hematologic/Lymphatic: No anemia, purpura, petechiae. Allergic/Immunologic: No itchy/runny eyes, nasal congestion, recent allergic reactions, rashes  ALLERGIES: No Known Allergies  HOME MEDICATIONS:  Current Outpatient Medications:  Alpha-Lipoic Acid 600 MG TABS, , Disp: , Rfl:    Ascorbic Acid 500 MG/15ML SYRP, Take by  mouth., Disp: , Rfl:    Biotin 100 MG/GM POWD, , Disp: , Rfl:    Cholecalciferol (VITAMIN D-3) 5000 units TABS, Take 1 tablet by mouth daily., Disp: , Rfl:    Dimethyl Fumarate (TECFIDERA) 240 MG CPDR, Take 240 mg by mouth 2 (two) times daily., Disp: , Rfl:    Magnesium (OPTIMAG 125) 125 MG CAPS, Take 2 capsules by mouth daily., Disp: , Rfl:    Multiple Vitamins-Minerals (MULTIVITAMIN ADULT PO), Take by mouth., Disp: , Rfl:    NP THYROID 60 MG tablet, Take 1 tablet (60 mg total) by mouth daily before breakfast., Disp: 90 tablet, Rfl: 3   Nutritional Supplements (OPTICLEANSE GHI PO), Take by mouth., Disp: , Rfl:    Turmeric POWD, by Does not apply route., Disp: , Rfl:  No current facility-administered medications for this visit.   Facility-Administered Medications Ordered in Other Visits:    gadopentetate dimeglumine (MAGNEVIST) injection 13 mL, 13 mL, Intravenous, Once PRN, Reyah Streeter A, MD   gadopentetate dimeglumine (MAGNEVIST) injection 13 mL, 13 mL, Intravenous, Once PRN, Thos Matsumoto, Nanine Means, MD   gadopentetate dimeglumine (MAGNEVIST) injection 13 mL, 13 mL, Intravenous, Once PRN, Cirilo Canner, Nanine Means, MD  PAST MEDICAL HISTORY: Past Medical History:  Diagnosis Date   Anemia    Hypothyroidism    Dr. [name]   Lyme disease    Multiple sclerosis ([place])    Neuromuscular disorder ([place])    MS   Vision abnormalities     PAST SURGICAL HISTORY: Past Surgical History:  Procedure Laterality Date   BREAST SURGERY  2002   COLONOSCOPY  2008   DILATATION & CURRETTAGE/HYSTEROSCOPY WITH RESECTOCOPE N/A 10/09/2012   Procedure: DILATATION & CURETTAGE/HYSTEROSCOPY WITH RESECTOCOPE;  Surgeon: [name], MD;  Location: [facility];  Service: Gynecology;  Laterality: N/A;  RESECTION with Versapoint    fibroid  2019   fibroidectomy   KNEE ARTHROSCOPY  1996   KNEE ARTHROSCOPY  1996    FAMILY HISTORY: Family History  Problem Relation Age of Onset   Hypertension Mother      Arthritis Mother    Colon cancer Father    Thyroid cancer Sister    Dementia Brother        Early   Heart attack Maternal Grandmother    Hypertension Maternal Grandmother    CAD Maternal Grandmother    Diabetes Maternal Grandfather    Lung cancer Maternal Grandfather    Alzheimer's disease Paternal Grandmother    Hypertension Paternal Grandmother    Hyperlipidemia Paternal Grandmother    Diabetes Paternal Grandfather    Asthma Brother    Thyroid cancer Brother     SOCIAL HISTORY:  Social History   Socioeconomic History   Marital status: Married    Spouse name: Not on file   Number of children: Not on file   Years of education: Not on file   Highest education level: Not on file  Occupational History   Not on file  Social Needs   Financial resource strain: Not on file   Food insecurity    Worry: Not on file    Inability: Not on file   Transportation needs    Medical: Not on file    Non-medical: Not on file  Tobacco Use   Smoking status: Never Smoker   Smokeless tobacco: Never Used  Substance and Sexual Activity   Alcohol use: Yes  Comment: 2x month   Drug use: No   Sexual activity: Not on file  Lifestyle   Physical activity    Days per week: Not on file    Minutes per session: Not on file   Stress: Not on file  Relationships   Social connections    Talks on phone: Not on file    Gets together: Not on file    Attends religious service: Not on file    Active member of club or organization: Not on file    Attends meetings of clubs or organizations: Not on file    Relationship status: Not on file   Intimate partner violence    Fear of current or ex partner: Not on file    Emotionally abused: Not on file    Physically abused: Not on file    Forced sexual activity: Not on file  Other Topics Concern   Not on file  Social History Narrative   Not on file     PHYSICAL EXAM  Vitals:   07/25/18 0824  BP: 110/75   Pulse: 64  Temp: 97.8 F (36.6 C)  Weight: 145 lb 8 oz (66 kg)  Height: 5\' 6"  (1.676 m)    Body mass index is 23.48 kg/m.   General: The patient is well-developed and well-nourished and in no acute distress  Musculoskeletal:  Back is nontender  Neurologic Exam  Mental status: The patient is alert and oriented x 3 at the time of the examination. The patient has apparent normal recent and remote memory, with an apparently normal attention span and concentration ability.   Speech is normal.  Cranial nerves: Extraocular movements are full.  Trapezius and sternocleidomastoid strength is normal. No dysarthria is noted.  Normal facial strength.  Normal facial sensation.  No obvious hearing deficits are noted.  Motor:  Muscle bulk is normal.   She has mildly increased muscle tone in the legs, left greater than right.. Strength is  5 / 5 in arms and right leg.   Minimal left leg weakness   Sensory: Sensory testing is intact to pinprick, soft touch and vibration sensation in arms, reduced vibration in toes.       Coordination: Cerebellar testing reveals good finger-nose-finger and slightly reduced heel-to-shin, left worse than right..  Gait and station: Station is normal.   The gait is slightly wide and there is a mild left foot drop.  Tandem gait is wide.  Romberg is negative.   Reflexes: Deep tendon reflexes are brisk bilaterally. There is spread at the knees, left greater the right. No ankle clonus.       DIAGNOSTIC DATA (LABS, IMAGING, TESTING) - I reviewed patient records, labs, notes, testing and imaging myself where available.  Lab Results  Component Value Date   WBC 4.2 11/21/2017   HGB 14.0 11/21/2017   HCT 40.3 11/21/2017   MCV 90.4 11/21/2017   PLT 280 11/21/2017      Component Value Date/Time   NA 141 11/21/2017 0908   NA 141 10/24/2016 1654   K 3.6 11/21/2017 0908   CL 102 11/21/2017 0908   CO2 28 11/21/2017 0908   GLUCOSE 95 11/21/2017 0908   BUN 9 11/21/2017  0908   BUN 12 10/24/2016 1654   CREATININE 0.55 11/21/2017 0908   CALCIUM 9.9 11/21/2017 0908   PROT 8.0 11/21/2017 0908   PROT 6.8 10/24/2016 1654   ALBUMIN 5.2 11/21/2017 0908   ALBUMIN 4.6 10/24/2016 1654   AST 18 11/21/2017 0908  ALT 15 11/21/2017 0908   ALKPHOS 51 11/21/2017 0908   BILITOT 0.5 11/21/2017 0908   BILITOT <0.2 10/24/2016 1654   GFRNONAA 104 10/24/2016 1654   GFRAA 119 10/24/2016 1654      ASSESSMENT AND PLAN    1. MULTIPLE SCLEROSIS   2. High risk medication use   3. Other fatigue   4. Gait disorder     1.   She is interested in switching of the Tecfidera to a different disease modifying therapy as she has had some progression over the last couple years.  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  We discussed options including Tysabri, Mazent and Zeposia.  At this time I would be more reluctant to place her on Ocrevus due to the [place] pandemic. 2.   To help decide medications, check JCV antibody, CBC, varicella IgG, CMP and cytochrome P450 2C9.  Additionally, I did an EKG.  It was borderline normal just showing very minimal sinus bradycardia (58 beats per minutes) 3.   She is advised to continue to be active and exercises as tolerated.   4.   Return in 6 months or sooner if there are new or worsening neurologic symptoms or based on the results of the MRI.   Delman Goshorn A. Felecia Shelling, MD, PhD 5/91/6384, 6:65 AM Certified in Neurology, Clinical Neurophysiology, Sleep Medicine, Pain Medicine and Neuroimaging  [facility] [address] [place], [place] [address] [phone]

## 2018-07-25 NOTE — Telephone Encounter (Signed)
Placed JCV lab in [facility] lock box for routine lab pick up. Results pending. 

## 2018-07-29 ENCOUNTER — Other Ambulatory Visit (INDEPENDENT_AMBULATORY_CARE_PROVIDER_SITE_OTHER): Payer: No Typology Code available for payment source

## 2018-07-29 ENCOUNTER — Other Ambulatory Visit: Payer: Self-pay

## 2018-07-29 ENCOUNTER — Other Ambulatory Visit: Payer: Self-pay | Admitting: Internal Medicine

## 2018-07-29 DIAGNOSIS — E039 Hypothyroidism, unspecified: Secondary | ICD-10-CM

## 2018-07-30 LAB — T3, FREE: T3, Free: 4.1 pg/mL (ref 2.3–4.2)

## 2018-07-30 LAB — T4, FREE: Free T4: 1.1 ng/dL (ref 0.8–1.8)

## 2018-07-30 LAB — TSH: TSH: 0.77 mIU/L (ref 0.40–4.50)

## 2018-07-31 ENCOUNTER — Telehealth: Payer: Self-pay | Admitting: Neurology

## 2018-07-31 ENCOUNTER — Encounter: Payer: Self-pay | Admitting: Internal Medicine

## 2018-07-31 DIAGNOSIS — G35 Multiple sclerosis: Secondary | ICD-10-CM

## 2018-07-31 MED ORDER — TECFIDERA 240 MG PO CPDR: 240.0000 mg | DELAYED_RELEASE_CAPSULE | Freq: Two times a day (BID) | ORAL | 5 refills | Status: DC

## 2018-07-31 MED ORDER — NP THYROID 60 MG PO TABS: 60.0000 mg | ORAL_TABLET | Freq: Every day | ORAL | 3 refills | Status: DC

## 2018-07-31 NOTE — Addendum Note (Signed)
Addended by: Hope Pigeon on: 07/31/2018 11:28 AM   Modules accepted: Orders

## 2018-07-31 NOTE — Telephone Encounter (Signed)
Pt is asking if the script for her Dimethyl Fumarate (TECFIDERA) 240 MG CPDR can be sent to her medical advocate Leanora Ivanoff phone#[phone]

## 2018-07-31 NOTE — Telephone Encounter (Signed)
Rx printed, waiting on MD signature

## 2018-07-31 NOTE — Telephone Encounter (Signed)
Dr. Felecia Shelling- pt wanting to know if lab results are back yet? I can call her

## 2018-08-01 LAB — VARICELLA ZOSTER ANTIBODY, IGG: Varicella zoster IgG: >4000 index (ref >165)

## 2018-08-01 LAB — COMPREHENSIVE METABOLIC PANEL
ALT: 18 IU/L (ref 0–32)
AST: 16 IU/L (ref 0–40)
Albumin/Globulin Ratio: 2.4 — ABNORMAL HIGH (ref 1.2–2.2)
Albumin: 5.1 g/dL — ABNORMAL HIGH (ref 3.8–4.9)
Alkaline Phosphatase: 52 IU/L (ref 39–117)
BUN/Creatinine Ratio: 14 (ref 9–23)
BUN: 9 mg/dL (ref 6–24)
Bilirubin Total: <0.2 mg/dL (ref 0.0–1.2)
CO2: 25 mmol/L (ref 20–29)
Calcium: 9.8 mg/dL (ref 8.7–10.2)
Chloride: 104 mmol/L (ref 96–106)
Creatinine, Ser: 0.64 mg/dL (ref 0.57–1.00)
GFR calc Af Amer: 115 mL/min/{1.73_m2} (ref >59)
GFR calc non Af Amer: 99 mL/min/{1.73_m2} (ref >59)
Globulin, Total: 2.1 g/dL (ref 1.5–4.5)
Glucose: 79 mg/dL (ref 65–99)
Potassium: 4.4 mmol/L (ref 3.5–5.2)
Sodium: 145 mmol/L — ABNORMAL HIGH (ref 134–144)
Total Protein: 7.2 g/dL (ref 6.0–8.5)

## 2018-08-01 LAB — CYTOCHROME P450 2C9 GENOTYPING

## 2018-08-01 LAB — CBC WITH DIFFERENTIAL/PLATELET
Basophils Absolute: 0 10*3/uL (ref 0.0–0.2)
Basos: 1 %
EOS (ABSOLUTE): 0 10*3/uL (ref 0.0–0.4)
Eos: 1 %
Hematocrit: 42.2 % (ref 34.0–46.6)
Hemoglobin: 14.3 g/dL (ref 11.1–15.9)
Immature Grans (Abs): 0 10*3/uL (ref 0.0–0.1)
Immature Granulocytes: 0 %
Lymphocytes Absolute: 1.8 10*3/uL (ref 0.7–3.1)
Lymphs: 33 %
MCH: 31.2 pg (ref 26.6–33.0)
MCHC: 33.9 g/dL (ref 31.5–35.7)
MCV: 92 fL (ref 79–97)
Monocytes Absolute: 0.4 10*3/uL (ref 0.1–0.9)
Monocytes: 7 %
Neutrophils Absolute: 3.1 10*3/uL (ref 1.4–7.0)
Neutrophils: 58 %
Platelets: 256 10*3/uL (ref 150–450)
RBC: 4.58 x10E6/uL (ref 3.77–5.28)
RDW: 12.4 % (ref 11.7–15.4)
WBC: 5.3 10*3/uL (ref 3.4–10.8)

## 2018-08-05 NOTE — Telephone Encounter (Signed)
JCV drawn on 07/25/18 positive, index: 3.20

## 2018-08-22 ENCOUNTER — Ambulatory Visit: Payer: Self-pay | Admitting: Neurology

## 2018-09-10 ENCOUNTER — Other Ambulatory Visit: Payer: Self-pay | Admitting: *Deleted

## 2018-09-10 DIAGNOSIS — G35 Multiple sclerosis: Secondary | ICD-10-CM

## 2018-09-10 MED ORDER — TECFIDERA 240 MG PO CPDR: 240.0000 mg | DELAYED_RELEASE_CAPSULE | Freq: Two times a day (BID) | ORAL | 11 refills | Status: DC

## 2018-11-06 ENCOUNTER — Telehealth: Payer: Self-pay

## 2018-11-06 DIAGNOSIS — E2839 Other primary ovarian failure: Secondary | ICD-10-CM

## 2018-11-06 DIAGNOSIS — Z1231 Encounter for screening mammogram for malignant neoplasm of breast: Secondary | ICD-10-CM

## 2018-11-06 NOTE — Telephone Encounter (Signed)
I called pt w/no answer.  I sent pt another Mychart message asking about information of preference.

## 2018-11-06 NOTE — Telephone Encounter (Signed)
Pt is requesting a bone density and a mammogram.  I sent pt a mychart message informing her to come in for a f/u appointment/physicial, as she has not been seen since 11/2017.

## 2018-11-06 NOTE — Telephone Encounter (Signed)
Where would she like these done, ie the breast center, [place], etc.

## 2018-11-12 NOTE — Telephone Encounter (Signed)
Pt is requesting the order to go to [facility].

## 2018-11-22 ENCOUNTER — Encounter: Payer: Self-pay | Admitting: Family Medicine

## 2018-11-25 ENCOUNTER — Encounter: Payer: Self-pay | Admitting: Family Medicine

## 2018-11-28 ENCOUNTER — Encounter: Payer: No Typology Code available for payment source | Admitting: Family Medicine

## 2018-12-02 ENCOUNTER — Other Ambulatory Visit: Payer: Self-pay

## 2018-12-03 ENCOUNTER — Ambulatory Visit (INDEPENDENT_AMBULATORY_CARE_PROVIDER_SITE_OTHER): Payer: PRIVATE HEALTH INSURANCE | Admitting: Family Medicine

## 2018-12-03 ENCOUNTER — Encounter: Payer: Self-pay | Admitting: Family Medicine

## 2018-12-03 VITALS — BP 120/72 | HR 62 | Temp 98.6°F | Ht 66.0 in | Wt 145.6 lb

## 2018-12-03 DIAGNOSIS — Z79899 Other long term (current) drug therapy: Secondary | ICD-10-CM | POA: Diagnosis not present

## 2018-12-03 DIAGNOSIS — E039 Hypothyroidism, unspecified: Secondary | ICD-10-CM

## 2018-12-03 DIAGNOSIS — G35 Multiple sclerosis: Secondary | ICD-10-CM | POA: Diagnosis not present

## 2018-12-03 DIAGNOSIS — Z0000 Encounter for general adult medical examination without abnormal findings: Secondary | ICD-10-CM

## 2018-12-03 DIAGNOSIS — Z01818 Encounter for other preprocedural examination: Secondary | ICD-10-CM | POA: Diagnosis not present

## 2018-12-03 LAB — CBC WITH DIFFERENTIAL/PLATELET
Basophils Absolute: 0 10*3/uL (ref 0.0–0.1)
Basophils Relative: 0.7 % (ref 0.0–3.0)
Eosinophils Absolute: 0 10*3/uL (ref 0.0–0.7)
Eosinophils Relative: 0.5 % (ref 0.0–5.0)
HCT: 41.5 % (ref 36.0–46.0)
Hemoglobin: 14 g/dL (ref 12.0–15.0)
Lymphocytes Relative: 30.5 % (ref 12.0–46.0)
Lymphs Abs: 1.5 10*3/uL (ref 0.7–4.0)
MCHC: 33.7 g/dL (ref 30.0–36.0)
MCV: 93.3 fl (ref 78.0–100.0)
Monocytes Absolute: 0.4 10*3/uL (ref 0.1–1.0)
Monocytes Relative: 7.5 % (ref 3.0–12.0)
Neutro Abs: 2.9 10*3/uL (ref 1.4–7.7)
Neutrophils Relative %: 60.8 % (ref 43.0–77.0)
Platelets: 267 10*3/uL (ref 150.0–400.0)
RBC: 4.44 Mil/uL (ref 3.87–5.11)
RDW: 13.7 % (ref 11.5–15.5)
WBC: 4.8 10*3/uL (ref 4.0–10.5)

## 2018-12-03 LAB — COMPREHENSIVE METABOLIC PANEL
ALT: 17 U/L (ref 0–35)
AST: 20 U/L (ref 0–37)
Albumin: 4.6 g/dL (ref 3.5–5.2)
Alkaline Phosphatase: 51 U/L (ref 39–117)
BUN: 8 mg/dL (ref 6–23)
CO2: 29 mEq/L (ref 19–32)
Calcium: 9.8 mg/dL (ref 8.4–10.5)
Chloride: 103 mEq/L (ref 96–112)
Creatinine, Ser: 0.53 mg/dL (ref 0.40–1.20)
GFR: 118.59 mL/min (ref >60.00)
Glucose, Bld: 96 mg/dL (ref 70–99)
Potassium: 4.2 mEq/L (ref 3.5–5.1)
Sodium: 141 mEq/L (ref 135–145)
Total Bilirubin: 0.5 mg/dL (ref 0.2–1.2)
Total Protein: 7.6 g/dL (ref 6.0–8.3)

## 2018-12-03 LAB — LIPID PANEL
Cholesterol: 216 mg/dL — ABNORMAL HIGH (ref 0–200)
HDL: 74.5 mg/dL (ref >39.00)
LDL Cholesterol: 127 mg/dL — ABNORMAL HIGH (ref 0–99)
NonHDL: 141.42
Total CHOL/HDL Ratio: 3
Triglycerides: 71 mg/dL (ref 0.0–149.0)
VLDL: 14.2 mg/dL (ref 0.0–40.0)

## 2018-12-03 LAB — T4, FREE: Free T4: 1.39 ng/dL (ref 0.60–1.60)

## 2018-12-03 LAB — TSH: TSH: 0.76 u[IU]/mL (ref 0.35–4.50)

## 2018-12-03 NOTE — Assessment & Plan Note (Signed)
Reviewed preventive care protocols, scheduled due services, and updated immunizations Discussed nutrition, exercise, diet, and healthy lifestyle. Pap smaer UTD, Mammogram is scheduled for 01/07/19, DEXA scheduled for 02/06/19.

## 2018-12-03 NOTE — Assessment & Plan Note (Addendum)
Preoperative consultation at the request of surgeon Dr. Shanon Brow who plans on performing breast implant removal on February 8. . Planned anesthesia: general. The patient hasnever had issues with anesthesia. Patients bleeding risk: no recent abnormal bleeding. Patient does not have objections to receiving blood products if needed. EKG reassuring - NSR. Additional las requested by her surgeon have been ordered to be drawn today. Her cardiac risk is low.  From my medical standpoint, she is cleared for surgery.

## 2018-12-03 NOTE — Assessment & Plan Note (Signed)
Multiple Sclerosis- followed by Dr. Felecia Shelling. Last seen by him on 07/25/18.  Note reviewed. She has been on Tecfidera for about 6 years.  Initially, she was on Betaseron and then she switched to Tysabri but became JCV positive and switched to [place].   She had exacerbation while on Gilenya and went back on the Tysabri and then went on Tecfidera..   Note reviewed. Advised to continue Tecfidera.  They discussing trying other medications but she seems to still be on same rxs, perhaps because her JCV Ab was a very high positive so would indicate certain medications would not be appropriate. She has appointment scheduled with Dr. Felecia Shelling on 01/27/19.

## 2018-12-03 NOTE — Assessment & Plan Note (Signed)
Hypothyroidism- followed by Dr. Ivonne Andrew- currently taking NP thyroid 60 mg daily before breakfast.  Since she has not had thyroid labs done since 7/20, will order panel and route to Dr. Renne Crigler for her upcoming appointment.  The patient indicates understanding of these issues and agrees with the plan.

## 2018-12-03 NOTE — Patient Instructions (Addendum)
Great to see you.  Happy Thanksgiving!   I will call you with your lab results from today and you can view them online.    I will fax your surgical form, labs and EKG to your surgeon.

## 2018-12-03 NOTE — Progress Notes (Signed)
Subjective:    Patient ID: Samantha Church, female    DOB: 1960/08/26, 58 y.o.   MRN: [id number]  Chief Complaint  Patient presents with  . Annual Exam    Cpe X fasting  . pre op clearance    HPI Patient is in today for CPX, follow up of chronic medical issues and preoperative clearance.     Health Maintenance  Topic Date Due  . MAMMOGRAM  11/17/2018  . PAP SMEAR-Modifier  11/21/2020  . TETANUS/TDAP  11/23/2020  . INFLUENZA VACCINE  Completed  . Hepatitis C Screening  Completed  . HIV Screening  Completed  . Fecal DNA (Cologuard)  Discontinued    Last pap smear done by me on 11/21/17- normal Mammogram scheduled for 01/07/19, DEXA scheduled for 02/06/19.  Depression screen [facility] 2/9 12/03/2018 02/12/2017  Decreased Interest 0 0  Down, Depressed, Hopeless 0 0  PHQ - 2 Score 0 0     Patient sent the following my chart message-  "Dr. Shanon Brow has requested testing for my upcoming surgery.  She wants a CBC, BMP and an EKG.  Can this be handled during my physical scheduled on 11/24/2    Multiple Sclerosis- followed by Dr. Felecia Shelling. Last seen by him on 07/25/18.  Note reviewed. She has been on Tecfidera for about 6 years.  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   She had exacerbation while on Gilenya and went back on the Tysabri and then went on Tecfidera..   Note reviewed. Advised to continue Tecfidera.  They discussing trying other medications but she seems to still be on same rxs, perhaps because her JCV Ab was a very high positive so would indicate certain medications would not be appropriate. She has appointment scheduled with Dr. Felecia Shelling on 01/27/19.   Hypothyroidism- followed by Dr. Cruzita Lederer (endo)- currently taking NP thyroid 60 mg daily before breakfast.  Denies any symptoms of hypo or hyperthyroidism. Lab Results  Component Value Date   TSH 0.77 07/29/2018   Has appointment to see Dr. Cruzita Lederer on  04/16/19 for yearly follow up.  Past Medical History:  Diagnosis Date  . Anemia   . Hypothyroidism    Dr. [name]  . Lyme disease   . Multiple sclerosis ([place])   . Neuromuscular disorder ([place])    MS  . Vision abnormalities     Past Surgical History:  Procedure Laterality Date  . BREAST SURGERY  2002  . COLONOSCOPY  2008  . DILATATION & CURRETTAGE/HYSTEROSCOPY WITH RESECTOCOPE N/A 10/09/2012   Procedure: DILATATION & CURETTAGE/HYSTEROSCOPY WITH RESECTOCOPE;  Surgeon: [name], MD;  Location: [facility];  Service: Gynecology;  Laterality: N/A;  RESECTION with Versapoint   . fibroid  2019   fibroidectomy  . KNEE ARTHROSCOPY  1996  . KNEE ARTHROSCOPY  1996    Family History  Problem Relation Age of Onset  . Hypertension Mother   . Arthritis Mother   . Colon cancer Father   . Thyroid cancer Sister   . Dementia Brother        Early  . Heart attack Maternal Grandmother   . Hypertension Maternal Grandmother   . CAD Maternal Grandmother   . Diabetes Maternal Grandfather   . Lung cancer Maternal Grandfather   . Alzheimer's disease Paternal Grandmother   . Hypertension Paternal Grandmother   . Hyperlipidemia Paternal Grandmother   . Diabetes Paternal Grandfather   . Asthma Brother   .  Thyroid cancer Brother     Social History   Socioeconomic History  . Marital status: Married    Spouse name: Not on file  . Number of children: Not on file  . Years of education: Not on file  . Highest education level: Not on file  Occupational History  . Not on file  Social Needs  . Financial resource strain: Not on file  . Food insecurity    Worry: Not on file    Inability: Not on file  . Transportation needs    Medical: Not on file    Non-medical: Not on file  Tobacco Use  . Smoking status: Never Smoker  . Smokeless tobacco: Never Used  Substance and Sexual Activity  . Alcohol use: Yes    Comment: 2x month  . Drug use: No  . Sexual activity: Not on file  Lifestyle  .  Physical activity    Days per week: Not on file    Minutes per session: Not on file  . Stress: Not on file  Relationships  . Social Herbalist on phone: Not on file    Gets together: Not on file    Attends religious service: Not on file    Active member of club or organization: Not on file    Attends meetings of clubs or organizations: Not on file    Relationship status: Not on file  . Intimate partner violence    Fear of current or ex partner: Not on file    Emotionally abused: Not on file    Physically abused: Not on file    Forced sexual activity: Not on file  Other Topics Concern  . Not on file  Social History Narrative  . Not on file    Outpatient Medications Prior to Visit  Medication Sig Dispense Refill  . Alpha-Lipoic Acid 600 MG TABS     . amoxicillin (AMOXIL) 500 MG capsule Take 500 mg by mouth 3 (three) times daily.    . Ascorbic Acid 500 MG/15ML SYRP Take by mouth.    . Biotin 100 MG/GM POWD     . Cholecalciferol (VITAMIN D-3) 5000 units TABS Take 1 tablet by mouth daily.    . Dimethyl Fumarate (TECFIDERA) 240 MG CPDR Take 1 capsule (240 mg total) by mouth 2 (two) times daily. 60 capsule 11  . fluconazole (DIFLUCAN) 200 MG tablet Take 200 mg by mouth daily.    . Magnesium (OPTIMAG 125) 125 MG CAPS Take 2 capsules by mouth daily.    . minocycline (MINOCIN) 100 MG capsule Take 100 mg by mouth 2 (two) times daily.    . Multiple Vitamins-Minerals (MULTIVITAMIN ADULT PO) Take by mouth.    . NP THYROID 60 MG tablet Take 1 tablet (60 mg total) by mouth daily before breakfast. 90 tablet 3  . Nutritional Supplements (OPTICLEANSE GHI PO) Take by mouth.    . Turmeric POWD by Does not apply route.     Facility-Administered Medications Prior to Visit  Medication Dose Route Frequency Provider Last Rate Last Dose  . gadopentetate dimeglumine (MAGNEVIST) injection 13 mL  13 mL Intravenous Once PRN Sater, Richard A, MD      . gadopentetate dimeglumine (MAGNEVIST)  injection 13 mL  13 mL Intravenous Once PRN Sater, Richard A, MD      . gadopentetate dimeglumine (MAGNEVIST) injection 13 mL  13 mL Intravenous Once PRN Sater, Nanine Means, MD        No Known Allergies  Review of Systems  Constitutional: Negative.  Negative for chills, fever and malaise/fatigue.  HENT: Negative for congestion and hearing loss.   Eyes: Negative for blurred vision, discharge and redness.  Respiratory: Negative for cough and shortness of breath.   Cardiovascular: Negative for chest pain, palpitations and leg swelling.  Gastrointestinal: Negative for abdominal pain and heartburn.  Genitourinary: Negative for dysuria.  Musculoskeletal: Negative for falls.  Skin: Negative for rash.  Neurological: Positive for focal weakness and weakness. Negative for loss of consciousness and headaches.  Endo/Heme/Allergies: Does not bruise/bleed easily.  Psychiatric/Behavioral: Negative for depression.  All other systems reviewed and are negative.      Objective:    BP 120/72   Pulse 62   Temp 98.6 F (37 C) (Temporal)   Ht 5\' 6"  (1.676 m)   Wt 145 lb 9.6 oz (66 kg)   SpO2 99%   BMI 23.50 kg/m   Physical Exam Vitals signs and nursing note reviewed.  Constitutional:      Appearance: Normal appearance. She is not ill-appearing.  HENT:     Head: Normocephalic and atraumatic.     Right Ear: External ear normal.     Left Ear: External ear normal.     Nose: Nose normal.  Eyes:     General:        Right eye: No discharge.        Left eye: No discharge.  Cardiovascular:     Rate and Rhythm: Normal rate and regular rhythm.     Heart sounds: Normal heart sounds.  Pulmonary:     Effort: Pulmonary effort is normal.     Breath sounds: No wheezing.  Abdominal:     Palpations: Abdomen is soft. There is no mass.     Tenderness: There is no abdominal tenderness. There is no guarding.  Musculoskeletal: Normal range of motion.     Right lower leg: No edema.     Left lower leg: No  edema.  Skin:    General: Skin is dry.  Neurological:     Mental Status: She is alert and oriented to person, place, and time.     Deep Tendon Reflexes: Reflexes normal.  Psychiatric:        Mood and Affect: Mood normal.        Behavior: Behavior normal.     BP 120/72   Pulse 62   Temp 98.6 F (37 C) (Temporal)   Ht 5\' 6"  (1.676 m)   Wt 145 lb 9.6 oz (66 kg)   SpO2 99%   BMI 23.50 kg/m  Wt Readings from Last 3 Encounters:  12/03/18 145 lb 9.6 oz (66 kg)  07/25/18 145 lb 8 oz (66 kg)  11/21/17 142 lb 12.8 oz (64.8 kg)    Diabetic Foot Exam - Simple   No data filed     Lab Results  Component Value Date   WBC 5.3 07/25/2018   HGB 14.3 07/25/2018   HCT 42.2 07/25/2018   PLT 256 07/25/2018   GLUCOSE 79 07/25/2018   CHOL 218 (H) 11/21/2017   TRIG 69.0 11/21/2017   HDL 77.00 11/21/2017   LDLCALC 127 (H) 11/21/2017   ALT 18 07/25/2018   AST 16 07/25/2018   NA 145 (H) 07/25/2018   K 4.4 07/25/2018   CL 104 07/25/2018   CREATININE 0.64 07/25/2018   BUN 9 07/25/2018   CO2 25 07/25/2018   TSH 0.77 07/29/2018   HGBA1C 5.0 06/27/2016    Lab Results  Component Value Date   TSH 0.77 07/29/2018   Lab Results  Component Value Date   WBC 5.3 07/25/2018   HGB 14.3 07/25/2018   HCT 42.2 07/25/2018   MCV 92 07/25/2018   PLT 256 07/25/2018   Lab Results  Component Value Date   NA 145 (H) 07/25/2018   K 4.4 07/25/2018   CO2 25 07/25/2018   GLUCOSE 79 07/25/2018   BUN 9 07/25/2018   CREATININE 0.64 07/25/2018   BILITOT <0.2 07/25/2018   ALKPHOS 52 07/25/2018   AST 16 07/25/2018   ALT 18 07/25/2018   PROT 7.2 07/25/2018   ALBUMIN 5.1 (H) 07/25/2018   CALCIUM 9.8 07/25/2018   GFR 121.21 11/21/2017   Lab Results  Component Value Date   CHOL 218 (H) 11/21/2017   Lab Results  Component Value Date   HDL 77.00 11/21/2017   Lab Results  Component Value Date   LDLCALC 127 (H) 11/21/2017   Lab Results  Component Value Date   TRIG 69.0 11/21/2017   Lab  Results  Component Value Date   CHOLHDL 3 11/21/2017   Lab Results  Component Value Date   HGBA1C 5.0 06/27/2016       Assessment & Plan:   Problem List Items Addressed This Visit      Active Problems   MULTIPLE SCLEROSIS    Multiple Sclerosis- followed by Dr. Felecia Shelling. Last seen by him on 07/25/18.  Note reviewed. She has been on Tecfidera for about 6 years.  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   She had exacerbation while on Gilenya and went back on the Tysabri and then went on Tecfidera..   Note reviewed. Advised to continue Tecfidera.  They discussing trying other medications but she seems to still be on same rxs, perhaps because her JCV Ab was a very high positive so would indicate certain medications would not be appropriate. She has appointment scheduled with Dr. Felecia Shelling on 01/27/19.      Hypothyroidism (acquired)    Hypothyroidism- followed by Dr. Ivonne Andrew- currently taking NP thyroid 60 mg daily before breakfast.  Since she has not had thyroid labs done since 7/20, will order panel and route to Dr. Renne Crigler for her upcoming appointment.  The patient indicates understanding of these issues and agrees with the plan.       Relevant Orders   Lipid panel   T4, free   T3   TSH   Well woman exam without gynecological exam - Primary    Reviewed preventive care protocols, scheduled due services, and updated immunizations Discussed nutrition, exercise, diet, and healthy lifestyle. Pap smaer UTD, Mammogram is scheduled for 01/07/19, DEXA scheduled for 02/06/19.      Relevant Orders   EKG 12-Lead (Completed)   High risk medication use   Relevant Orders   CBC with Differential/Platelet   Comprehensive metabolic panel   Pre-operative clearance    Preoperative consultation at the request of surgeon Dr. Shanon Brow who plans on performing breast implant removal on February 8. . Planned anesthesia: general. The  patient hasnever had issues with anesthesia. Patients bleeding risk: no recent abnormal bleeding. Patient does not have objections to receiving blood products if needed. EKG reassuring - NSR. Additional las requested by her surgeon have been ordered to be drawn today. Her cardiac risk is low.  From my medical standpoint, she is cleared for surgery.         I  am having Samantha Church. Steel maintain her Ascorbic Acid, Vitamin D-3, Nutritional Supplements (OPTICLEANSE GHI PO), Biotin, Alpha-Lipoic Acid, OptiMag 125, Turmeric, Multiple Vitamins-Minerals (MULTIVITAMIN ADULT PO), NP Thyroid, Tecfidera, fluconazole, minocycline, and amoxicillin.  No orders of the defined types were placed in this encounter.    Arnette Norris, MD  This visit occurred during the SARS-CoV-2 public health emergency.  Safety protocols were in place, including screening questions prior to the visit, additional usage of staff PPE, and extensive cleaning of exam room while observing appropriate contact time as indicated for disinfecting solutions.

## 2018-12-04 ENCOUNTER — Encounter: Payer: Self-pay | Admitting: Family Medicine

## 2018-12-04 LAB — T3: T3, Total: 150 ng/dL (ref 76–181)

## 2019-01-05 IMAGING — MR MR CERVICAL SPINE W/O CM
4 of 5 series · 28 of 48 positions shown · non-contrast
Comparison: 06/02/2015

CLINICAL DATA: Left foot drop when walking. History of multiple
sclerosis.

EXAM:
MRI CERVICAL SPINE WITHOUT CONTRAST
TECHNIQUE: Multiplanar, multisequence MR imaging of the cervical spine was
performed. No intravenous contrast was administered.

[Series 3: T2 post-contrast · sagittal · 3.5mm · 0.70mm/px · 8 of 13 slices shown]
[im 1/13]
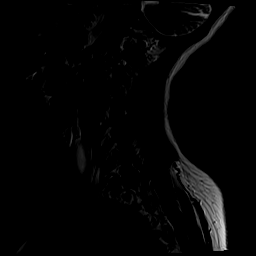
[im 2/13]
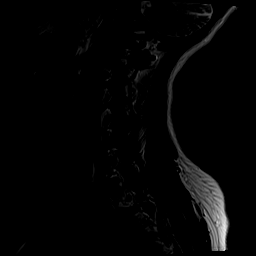
[im 4/13]
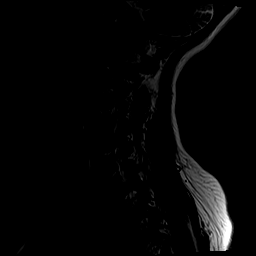
[im 6/13]
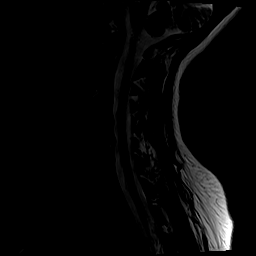
[im 7/13]
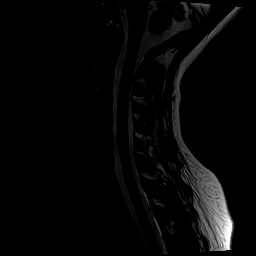
[im 9/13]
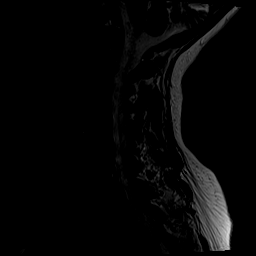
[im 11/13]
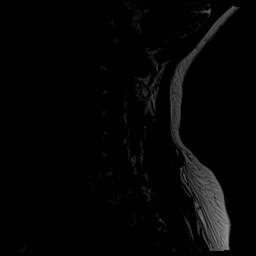
[im 13/13]
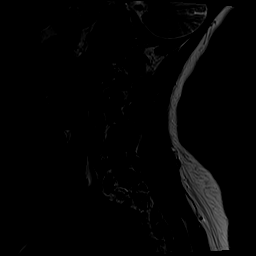

[Series 4: T1 · sagittal · 3.5mm · 0.35mm/px · 7 of 13 slices shown]
[im 1/13]
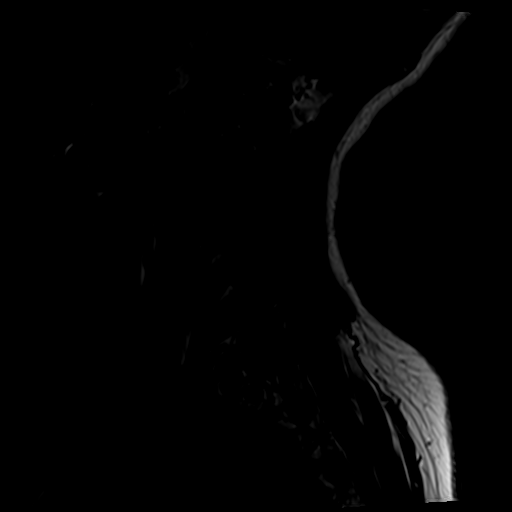
[im 3/13]
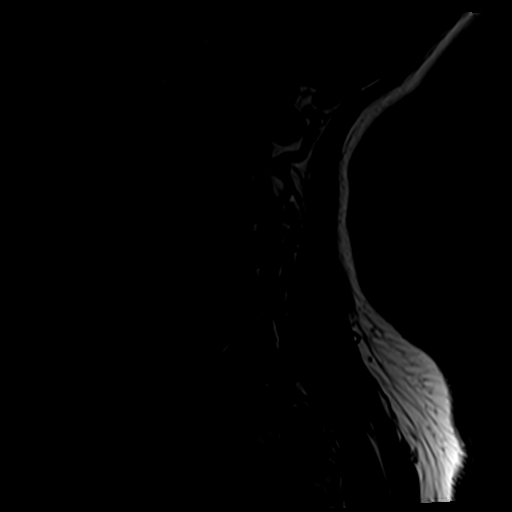
[im 5/13]
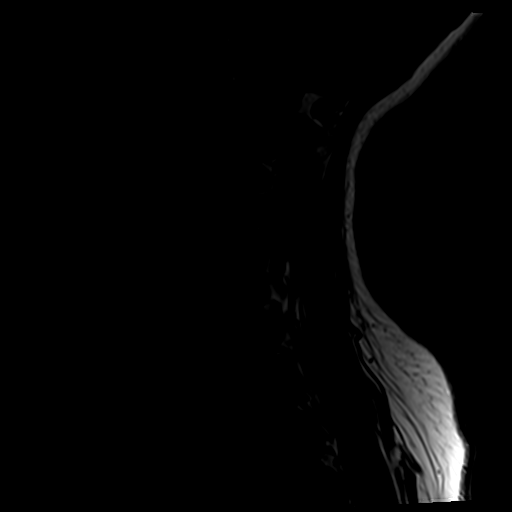
[im 7/13]
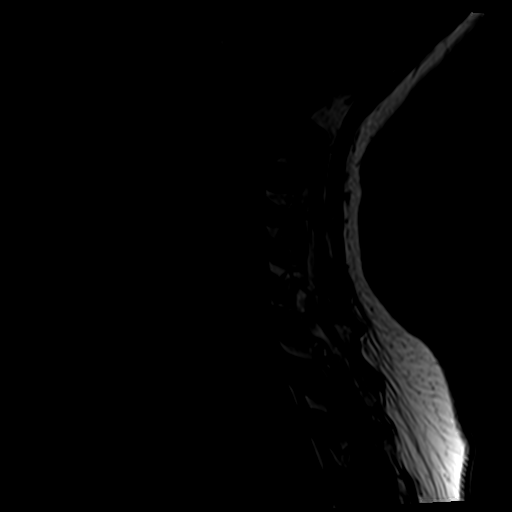
[im 9/13]
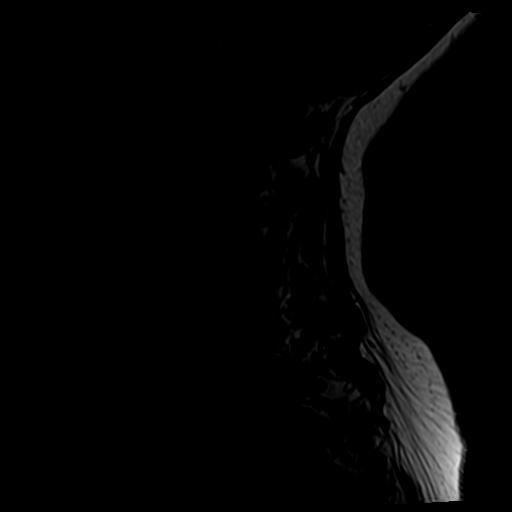
[im 11/13]
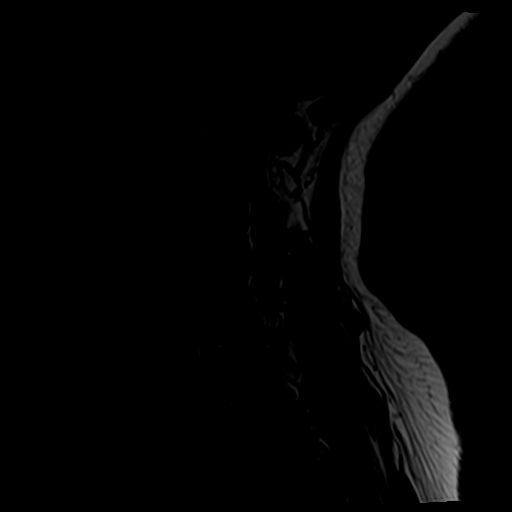
[im 13/13]
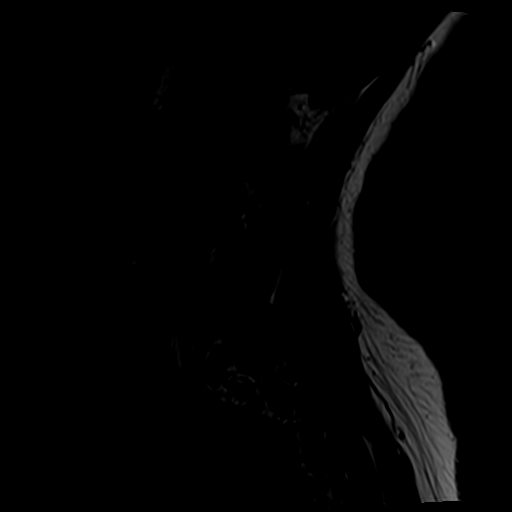

[Series 5: tir sag · sagittal · 3.5mm · 0.39mm/px · 4 of 13 slices shown]
[im 1/13]
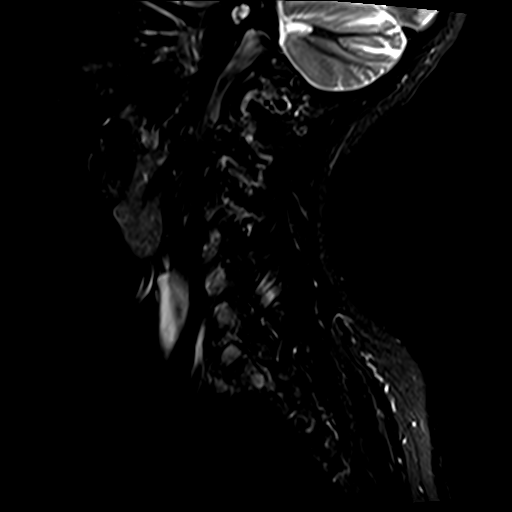
[im 3/13]
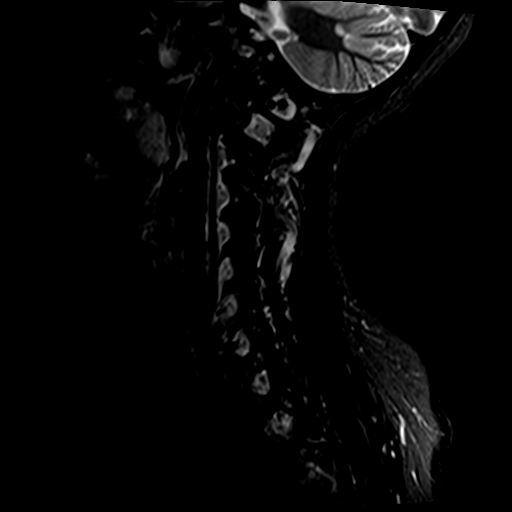
[im 7/13]
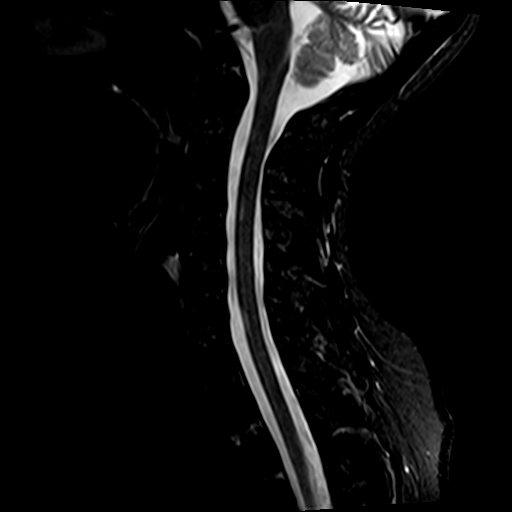
[im 11/13]
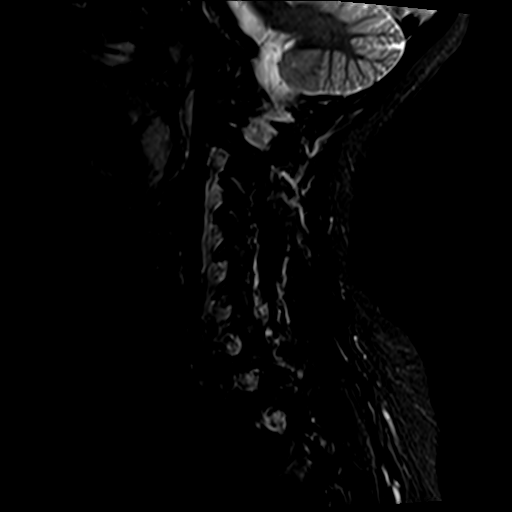

[Series 6: T2 · axial · 3.5mm · 0.39mm/px · z∈[-58,+32]mm · 9 of 24 slices shown]
[im 1/24]
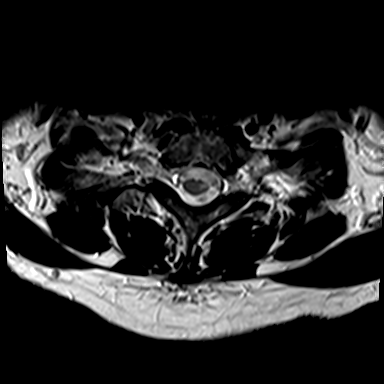
[im 4/24]
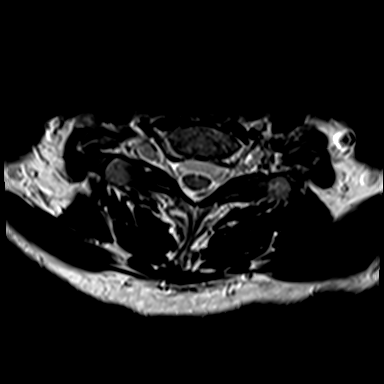
[im 8/24]
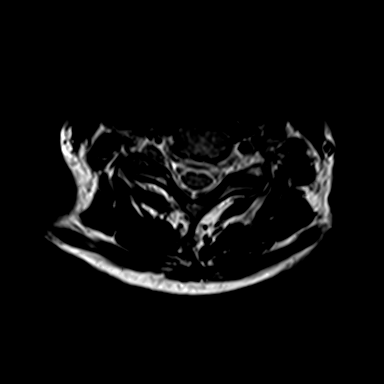
[im 10/24]
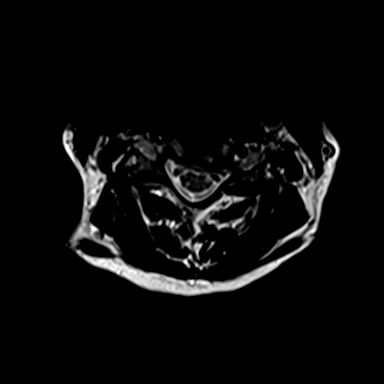
[im 12/24]
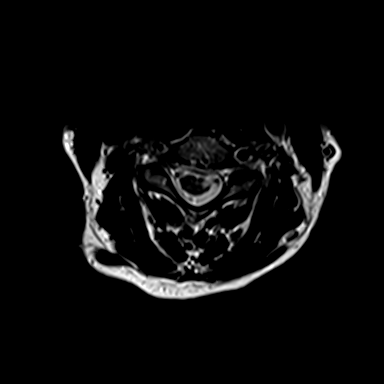
[im 14/24]
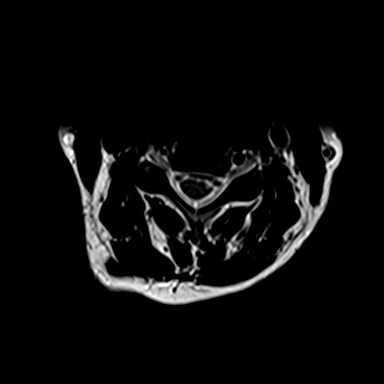
[im 16/24]
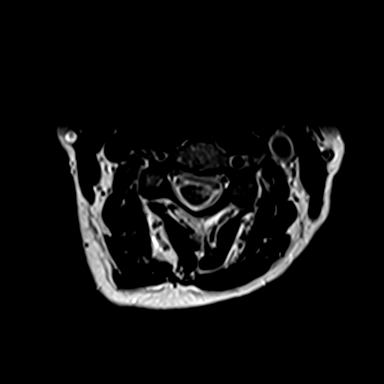
[im 20/24]
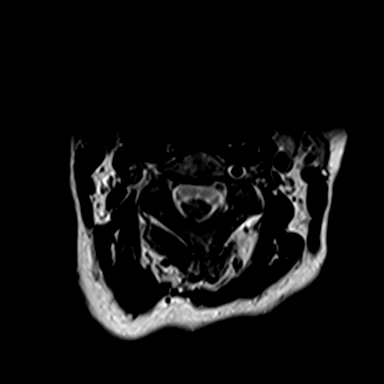
[im 24/24]
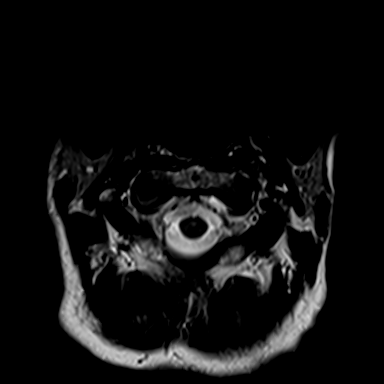

[28 of 48 positions shown; findings below may reference images not displayed]

FINDINGS: Alignment: Normal

Vertebrae: No primary bone lesion.

Cord: Multiple foci of abnormal T2 signal affecting the cord with
some cord volume loss. These foci are seen from C2 through T1 in
affect both the right in the left side of the cord, somewhat more
prominent on the left. There consistent with foci of chronic
demyelinating disease. No evidence of cord swelling.

Posterior Fossa, vertebral arteries, paraspinal tissues: Negative

Disc levels:

Mild, non-compressive degenerative changes at C3-4, C4-5 and C6-7.
At C5-6, there is uncovertebral hypertrophy on the left with mild
left foraminal stenosis.

Compared to the study of 8192, the findings appear less conspicuous.
IMPRESSION: Chronic multiple sclerosis involvement of the cervical and upper
thoracic spinal cord. Compared to the study of 8192, no new areas of
involvement are seen. The areas of involvement previously are now
less conspicuous, suggesting that they may be presently inactive.
There is generally more involvement on the left side of the cord
than the right.

## 2019-01-07 ENCOUNTER — Ambulatory Visit
Admission: RE | Admit: 2019-01-07 | Discharge: 2019-01-07 | Disposition: A | Discharge status: Home / Self Care | Payer: PRIVATE HEALTH INSURANCE | Source: Ambulatory Visit | Attending: Family Medicine | Admitting: Family Medicine

## 2019-01-07 ENCOUNTER — Other Ambulatory Visit: Payer: Self-pay

## 2019-01-07 DIAGNOSIS — Z1231 Encounter for screening mammogram for malignant neoplasm of breast: Secondary | ICD-10-CM

## 2019-01-27 ENCOUNTER — Encounter: Payer: Self-pay | Admitting: Neurology

## 2019-01-27 ENCOUNTER — Ambulatory Visit (INDEPENDENT_AMBULATORY_CARE_PROVIDER_SITE_OTHER): Payer: PRIVATE HEALTH INSURANCE | Admitting: Neurology

## 2019-01-27 ENCOUNTER — Other Ambulatory Visit: Payer: Self-pay

## 2019-01-27 VITALS — BP 120/77 | HR 70 | Temp 97.3°F | Ht 66.0 in | Wt 145.5 lb

## 2019-01-27 DIAGNOSIS — G35 Multiple sclerosis: Secondary | ICD-10-CM | POA: Diagnosis not present

## 2019-01-27 DIAGNOSIS — Z79899 Other long term (current) drug therapy: Secondary | ICD-10-CM | POA: Diagnosis not present

## 2019-01-27 DIAGNOSIS — R269 Unspecified abnormalities of gait and mobility: Secondary | ICD-10-CM

## 2019-01-27 DIAGNOSIS — R5383 Other fatigue: Secondary | ICD-10-CM | POA: Diagnosis not present

## 2019-01-27 MED ORDER — ARMODAFINIL 200 MG PO TABS: ORAL_TABLET | ORAL | 5 refills | Status: DC

## 2019-01-27 NOTE — Progress Notes (Signed)
[facility]  PATIENT: Samantha Church DOB: 11/23/1960  REFERRING DOCTOR OR PCP:  Donald Prose SOURCE: Patient and records from [facility] neurology.  _________________________________   HISTORICAL  CHIEF COMPLAINT:  Chief Complaint  Patient presents with  . Follow-up    RM 12. Last seen 07/25/2018  . Multiple Sclerosis    On Tecfidera    HISTORY OF PRESENT ILLNESS:  Samantha Church is a 59 year old woman with relapsing remitting multiple sclerosis diagnosed in 2007.    Update 01/27/2019: She denies any exacerbations and tolerates Tecfidera well.   She has noted slow worsening with gait -- speed and dragging left foot some.    She is not as active as she had been.   She is walking less.    Her last MRI for the brain in 2017 was unchanged and C-spine in 2018 was unchanged.     She did best on Tysabri but was JCV Ab high positive.   She is also on LDN but is uncertain whether it is helping.     She has a left foot drop.  No falls.    She ha mild left leg weakness and foot drop.   She denies painful tingling in her legs.    She has urinary frequency.     Vision is doing well.   She has fatigue many days.   She sleeps 8 hours at night and another 60-90 minutes   Update 07/25/2018: She is on Tecfidera and feels her gait is doing a little worse since the last visit.  The changes been gradual.  There has not been any definite exacerbation.  She tolerates the Tecfidera well..   She is doing exercises with PT.   Her left side is weaker than the right- arm, hand and leg.    She is noticing the left foot drop more.  She uses a cane to ambulate.  She has urinary frequency and urgency, about the same as last visit.    She notes some fatigue.  She notes cognitive issues, similar to last visit.  Mood is doing well.  She has been on Tecfidera for about 6 years.  Initially, she was on Betaseron and then she switched to Tysabri but became JCV positive and switched to [place].   She had  exacerbation while on Gilenya and went back on the Tysabri and then went on Tecfidera.Marland Kitchen     Update 10/24/2016::   She notes that the left leg is not moving as well.    She thinks the onset was gradual and has continued to progress.    She feels she does not have complete control over the leg.   She is concerned it may buckle.     Her left side has always been the worse.   Though gait feels mildly unsteady, she has no falls.    She is now using a cane for longer distances.   Bladder function is unchanged.  She notes some fatigue but this is stable. No change in cognition.  We reiviewed the MRI of the cervical spine from 04/26/2016.  She has multiple small lesions within the spinal cord including 3 on the left. When compared to the MRI from last year, there is no definite change.  From 04/13/2016:   MS:  She is on Tecfidera and tolerates it well.   She switched to it from [place] in 2014 (was on Tysabri but converted to JCV Ab positive).      MRI of the brain performed October 2015 showed 1 focus not present on the December 2014 MRI.   2017 MRI of the brain was stable compared to 2016.   MRI of the cervical spine 2017 (after onset of numbness below neck) showed 4 foci (one not present in 2008) and MRi T-spine showed two foci, neither present in 2008.     Gait/strength:    Gait is worse and she notes more left sided clumsiness and weakness.   Balance is off.   She does not use a cane.    Legs has some spasticity which seems stable.    She feels she needs to look down to walk.     Gait is wider and slower.    Arms are fine as far as strength.   Balance is much worse with eyes closed.   She has trouble going downstairs and can't lead with the left leg.  She has trouble going upstairs if she has her hands full.  Numbness:   She is noting more numbness in both hands.   Numbness is below her neck bilaterally.       MS History:   She was diagnosed in 2007 after presenting with right sided numbness and mild clumsiness / allodynia.    MRI was consistent with MS and she was started with Betaseron.  She then switched to Copaxone but had exacerbations and switched to Tysabri.     She converted to Tysabri but became JCV Ab positive and switched to Gilenya.   Due to exacerbations on Gilenya, she switched back to Tysabri x one year and then switched to Tecfidera (in 2014).     Bladder/Bowel:   She notes more urinary frequency and urgency.  No incontinence recently.  Fatigue/sleep:   She still has physical and cognitive fatigue.   She is sleepy around 2 pm and naps every afternoon x 1 hour.    She feels very refreshed after a one hour nap and does better the rest of the day.   She has Provigil but never took it because the nap helps.   She falls asleep easily at night and sleeps 8 hours.   (9-5 am) .  Mood/Cogntion:   She denies any depression or anxiety.   She denies major cognitive problems but she notes reduced attention and focus at times.  She has no verbal fluencies issues.      REVIEW OF SYSTEMS: Constitutional: No fevers, chills, sweats, or change in appetite.   She has fatigue Eyes: No visual changes, double vision, eye pain Ear, nose and throat: No hearing loss, ear pain, nasal congestion, sore throat Cardiovascular: No chest pain, palpitations Respiratory: No shortness of breath at rest or with exertion.   No wheezes GastrointestinaI: No nausea, vomiting, diarrhea, abdominal pain, fecal incontinence Genitourinary: No dysuria, urinary retention or frequency.  No nocturia. Musculoskeletal: No neck pain, back pain Integumentary: No rash, pruritus, skin lesions Neurological: as above Psychiatric: No depression at this time.  No anxiety Endocrine: She has hypothyroidism.  No palpitations, diaphoresis, change in appetite, change in weigh or increased  thirst Hematologic/Lymphatic: No anemia, purpura, petechiae. Allergic/Immunologic: No itchy/runny eyes, nasal congestion, recent allergic reactions, rashes  ALLERGIES: No Known Allergies  HOME MEDICATIONS:  Current Outpatient Medications:  .  Alpha-Lipoic Acid 600 MG TABS, , Disp: , Rfl:  .  Ascorbic Acid 500 MG/15ML SYRP, Take by mouth., Disp: , Rfl:  .  Biotin 100 MG/GM POWD, , Disp: , Rfl:  .  Cholecalciferol (VITAMIN D-3) 5000 units TABS, Take 1 tablet by mouth daily., Disp: , Rfl:  .  Dimethyl Fumarate (TECFIDERA) 240 MG CPDR, Take 1 capsule (240 mg total) by mouth 2 (two) times daily., Disp: 60 capsule, Rfl: 11 .  fluconazole (DIFLUCAN) 200 MG tablet, Take 200 mg by mouth daily., Disp: , Rfl:  .  Magnesium (OPTIMAG 125) 125 MG CAPS, Take 2 capsules by mouth daily., Disp: , Rfl:  .  Multiple Vitamins-Minerals (MULTIVITAMIN ADULT PO), Take by mouth., Disp: , Rfl:  .  NALTREXONE HCL PO, Take 3.5 mg by mouth daily. Receives from specialty pharmacy, Disp: , Rfl:  .  NP THYROID 60 MG tablet, Take 1 tablet (60 mg total) by mouth daily before breakfast., Disp: 90 tablet, Rfl: 3 .  Nutritional Supplements (OPTICLEANSE GHI PO), Take by mouth., Disp: , Rfl:  .  Turmeric POWD, by Does not apply route., Disp: , Rfl:  .  Armodafinil 200 MG TABS, 1/2 to 1 pill po qAM, Disp: 30 tablet, Rfl: 5 No current facility-administered medications for this visit.  Facility-Administered Medications Ordered in Other Visits:  .  gadopentetate dimeglumine (MAGNEVIST) injection 13 mL, 13 mL, Intravenous, Once PRN, Ettamae Barkett A, MD .  gadopentetate dimeglumine (MAGNEVIST) injection 13 mL, 13 mL, Intravenous, Once PRN, Shaquilla Kehres A, MD .  gadopentetate dimeglumine (MAGNEVIST) injection 13 mL, 13 mL, Intravenous, Once PRN, Torah Pinnock, Nanine Means, MD  PAST MEDICAL HISTORY: Past Medical History:  Diagnosis Date  . Anemia   . Hypothyroidism    Dr. [name]  . Lyme disease   . Multiple sclerosis ([place])   .  Neuromuscular disorder ([place])    MS  . Vision abnormalities     PAST SURGICAL HISTORY: Past Surgical History:  Procedure Laterality Date  . AUGMENTATION MAMMAPLASTY Bilateral 2003   saline  . BREAST SURGERY  2002  . COLONOSCOPY  2008  . DILATATION & CURRETTAGE/HYSTEROSCOPY WITH RESECTOCOPE N/A 10/09/2012   Procedure: DILATATION & CURETTAGE/HYSTEROSCOPY WITH RESECTOCOPE;  Surgeon: [name], MD;  Location: [facility];  Service: Gynecology;  Laterality: N/A;  RESECTION with [place]ap[place]   . fibroid  2019   fibroidectomy  . KNEE ARTHROSCOPY  1996  . KNEE ARTHROSCOPY  1996    FAMILY HISTORY: Family History  Problem Relation Age of Onset  . Hypertension Mother   . Arthritis Mother   . Colon cancer Father   . Thyroid cancer Sister   . Dementia Brother        Early  . Heart attack Maternal Grandmother   . Hypertension Maternal Grandmother   . CAD Maternal Grandmother   . Diabetes Maternal Grandfather   . Lung cancer Maternal Grandfather   . Alzheimer's disease Paternal Grandmother   . Hypertension Paternal Grandmother   . Hyperlipidemia Paternal Grandmother   . Diabetes Paternal Grandfather   . Asthma Brother   . Thyroid cancer Brother     SOCIAL HISTORY:  Social History   Socioeconomic History  . Marital status: Married    Spouse name: Not on file  . Number of children: Not on file  . Years of education: Not on file  . Highest education level: Not on file  Occupational History  . Not on file  Tobacco Use  . Smoking status: Never Smoker  .  Smokeless tobacco: Never Used  Substance and Sexual Activity  . Alcohol use: Yes    Comment: 2x month  . Drug use: No  . Sexual activity: Not on file  Other Topics Concern  . Not on file  Social History Narrative  . Not on file   Social Determinants of Health   Financial Resource Strain:   . Difficulty of Paying Living Expenses: Not on file  Food Insecurity:   . Worried About Charity fundraiser in the Last Year: Not  on file  . Ran Out of Food in the Last Year: Not on file  Transportation Needs:   . Lack of Transportation (Medical): Not on file  . Lack of Transportation (Non-Medical): Not on file  Physical Activity:   . Days of Exercise per Week: Not on file  . Minutes of Exercise per Session: Not on file  Stress:   . Feeling of Stress : Not on file  Social Connections:   . Frequency of Communication with Friends and Family: Not on file  . Frequency of Social Gatherings with Friends and Family: Not on file  . Attends Religious Services: Not on file  . Active Member of Clubs or Organizations: Not on file  . Attends Archivist Meetings: Not on file  . Marital Status: Not on file  Intimate Partner Violence:   . Fear of Current or Ex-Partner: Not on file  . Emotionally Abused: Not on file  . Physically Abused: Not on file  . Sexually Abused: Not on file     PHYSICAL EXAM  Vitals:   01/27/19 0824  BP: 120/77  Pulse: 70  Temp: (!) 97.3 F (36.3 C)  Weight: 145 lb 8 oz (66 kg)  Height: 5\' 6"  (1.676 m)    Body mass index is 23.48 kg/m.   General: The patient is well-developed and well-nourished and in no acute distress  Musculoskeletal:  Back is nontender  Neurologic Exam  Mental status: The patient is alert and oriented x 3 at the time of the examination. The patient has apparent normal recent and remote memory, with an apparently normal attention span and concentration ability.   Speech is normal.  Cranial nerves: Extraocular movements are full.  Trapezius and sternocleidomastoid strength is normal. No dysarthria is noted.  Normal facial strength.  Normal facial sensation.  No obvious hearing deficits are noted.  Motor:  Muscle bulk is normal.   She has mildly increased muscle tone in the legs, left greater than right.. Strength is  5 / 5 in arms and right leg.   Minimal left leg weakness   Sensory: Sensory testing is intact to pinprick, soft touch and vibration sensation  in arms, reduced vibration in toes.       Coordination: She has good finger-nose-finger bilaterally.  Heel-to-shin is reduced, worse on the left.  Gait and station: Station is normal.   The gait is slightly wide with a left foot drop.  Tandem gait is wide.  Romberg is negative.   Reflexes: Deep tendon reflexes are brisk bilaterally. There is spread at the knees, left greater the right. No ankle clonus.       DIAGNOSTIC DATA (LABS, IMAGING, TESTING) - I reviewed patient records, labs, notes, testing and imaging myself where available.  Lab Results  Component Value Date   WBC 4.8 12/03/2018   HGB 14.0 12/03/2018   HCT 41.5 12/03/2018   MCV 93.3 12/03/2018   PLT 267.0 12/03/2018      Component  Value Date/Time   NA 141 12/03/2018 1204   NA 145 (H) 07/25/2018 1054   K 4.2 12/03/2018 1204   CL 103 12/03/2018 1204   CO2 29 12/03/2018 1204   GLUCOSE 96 12/03/2018 1204   BUN 8 12/03/2018 1204   BUN 9 07/25/2018 1054   CREATININE 0.53 12/03/2018 1204   CALCIUM 9.8 12/03/2018 1204   PROT 7.6 12/03/2018 1204   PROT 7.2 07/25/2018 1054   ALBUMIN 4.6 12/03/2018 1204   ALBUMIN 5.1 (H) 07/25/2018 1054   AST 20 12/03/2018 1204   ALT 17 12/03/2018 1204   ALKPHOS 51 12/03/2018 1204   BILITOT 0.5 12/03/2018 1204   BILITOT <0.2 07/25/2018 1054   GFRNONAA 99 07/25/2018 1054   GFRAA 115 07/25/2018 1054      ASSESSMENT AND PLAN    1. Other fatigue   2. Multiple sclerosis (HCC)   3. Gait disorder   4. High risk medication use     1.   For now, she will continue Tecfidera.  She will have an MRI of the brain and cervical spine performed to determine if there is any subclinical progression.  If this is occurring, we need to consider a different disease modifying therapy.   2.   We had previously discussed some therapeutic options besides Tecfidera and will consider these based on the MRIs and changes in symptoms.   3.   Armodafinil for fatigue.  She is advised to continue to be active  and exercises as tolerated.   4.   Return in 6 months or sooner if there are new or worsening neurologic symptoms or based on the results of the MRI.   Karyme Mcconathy A. Felecia Shelling, MD, PhD [id number], [id number] PM Certified in Neurology, Clinical Neurophysiology, Sleep Medicine, Pain Medicine and Neuroimaging  [facility] [address] [place], [place] [address] [phone]

## 2019-02-06 ENCOUNTER — Ambulatory Visit
Admission: RE | Admit: 2019-02-06 | Discharge: 2019-02-06 | Disposition: A | Discharge status: Home / Self Care | Payer: PRIVATE HEALTH INSURANCE | Source: Ambulatory Visit | Attending: Family Medicine | Admitting: Family Medicine

## 2019-02-06 ENCOUNTER — Other Ambulatory Visit: Payer: Self-pay

## 2019-02-06 DIAGNOSIS — E2839 Other primary ovarian failure: Secondary | ICD-10-CM

## 2019-02-17 HISTORY — PX: BREAST IMPLANT REMOVAL: SUR1101

## 2019-03-31 ENCOUNTER — Telehealth: Payer: Self-pay | Admitting: Neurology

## 2019-03-31 ENCOUNTER — Other Ambulatory Visit: Payer: Self-pay

## 2019-03-31 ENCOUNTER — Ambulatory Visit (INDEPENDENT_AMBULATORY_CARE_PROVIDER_SITE_OTHER): Payer: PRIVATE HEALTH INSURANCE | Admitting: Neurology

## 2019-03-31 ENCOUNTER — Encounter: Payer: Self-pay | Admitting: Neurology

## 2019-03-31 VITALS — BP 110/72 | HR 64 | Temp 97.4°F | Ht 66.0 in | Wt 138.5 lb

## 2019-03-31 DIAGNOSIS — R269 Unspecified abnormalities of gait and mobility: Secondary | ICD-10-CM

## 2019-03-31 DIAGNOSIS — R29898 Other symptoms and signs involving the musculoskeletal system: Secondary | ICD-10-CM

## 2019-03-31 DIAGNOSIS — R5383 Other fatigue: Secondary | ICD-10-CM | POA: Diagnosis not present

## 2019-03-31 DIAGNOSIS — G35 Multiple sclerosis: Secondary | ICD-10-CM | POA: Diagnosis not present

## 2019-03-31 DIAGNOSIS — Z79899 Other long term (current) drug therapy: Secondary | ICD-10-CM

## 2019-03-31 NOTE — Progress Notes (Signed)
[facility]  PATIENT: Samantha Church DOB: 05-01-1960  REFERRING DOCTOR OR PCP:  Donald Prose SOURCE: Patient and records from [facility] neurology.  _________________________________   HISTORICAL  CHIEF COMPLAINT:  Chief Complaint  Patient presents with  . Follow-up    RM 13 with husband (temp: 97.5) Last seen 01/58/2021  . Multiple Sclerosis    On Tecfidera. Reports more difficulty with walking. Woke up this past Sat morning she woke up and could not walk and was dizzy. Sx have improved but she is not back to her baseline. She ambulates with cane.    HISTORY OF PRESENT ILLNESS:  Samantha Church is a 59 year old woman with relapsing remitting multiple sclerosis diagnosed in 2007.    Update 03/31/2019: She woke up Friday morning and was very weak. Weakness was worse in both legs.   Her left leg (weaker in general) was worse than the right.   Her vision seemed blurry.      She was off balanced and weaker the rest of that day and Saturday..  She could barely walk on Friday and Saturday and slept a lot on Sunday.  She had incontinence but was unable to get to the bathroom in time (has baseline urgency).      She improved over the weekend but is still not at her baseline.    For MS, she is on Tecfidera.  She tolerates it well.  Her last brain MRI was in 2017.  She felt best while on Tysabri but was JCV antibody positive.  She was recently placed on Rifabutin, azithromycin, Nysatatin and Ceftin for a diagnosis of Lyme disease.  She has not had any diarrhea or vomiting.  She does not feel bladder function has changed.  She notes no shortness of breath or fevers.   Update 01/27/2019: She denies any exacerbations and tolerates Tecfidera well.   She has noted slow worsening with gait -- speed and dragging left foot some.    She is not as active as she had been.   She is walking less.    Her last MRI for the brain in 2017 was unchanged and C-spine in 2018 was unchanged.     She  did best on Tysabri but was JCV Ab high positive.   She is also on LDN but is uncertain whether it is helping.     She has a left foot drop.  No falls.    She ha mild left leg weakness and foot drop.   She denies painful tingling in her legs.    She has urinary frequency.     Vision is doing well.   She has fatigue many days.   She sleeps 8 hours at night and another 60-90 minutes   Update 07/25/2018: She is on Tecfidera and feels her gait is doing a little worse since the last visit.  The changes been gradual.  There has not been any definite exacerbation.  She tolerates the Tecfidera well..   She is doing exercises with PT.   Her left side is weaker than the right- arm, hand and leg.    She is noticing the left foot drop more.  She uses a cane to ambulate.  She has urinary frequency and urgency, about the same as last visit.    She notes some fatigue.  She notes cognitive issues, similar to last visit.  Mood is doing well.  She has been on Tecfidera for about 6 years.  Initially, she was on  Betaseron and then she switched to Tysabri but became JCV positive and switched to Gilenya.   She had exacerbation while on Gilenya and went back on the Tysabri and then went on Tecfidera.Marland Kitchen     Update 10/24/2016::   She notes that the left leg is not moving as well.    She thinks the onset was gradual and has continued to progress.    She feels she does not have complete control over the leg.   She is concerned it may buckle.     Her left side has always been the worse.   Though gait feels mildly unsteady, she has no falls.    She is now using a cane for longer distances.   Bladder function is unchanged.  She notes some fatigue but this is stable. No change in cognition.  We reiviewed the MRI of the cervical spine from 04/26/2016.  She has multiple small lesions within the spinal cord including 3 on the left. When compared to the MRI from last year, there is no definite change.                                                                                                                                                    From 04/13/2016:   MS:  She is on Tecfidera and tolerates it well.   She switched to it from [place] in 2014 (was on Tysabri but converted to JCV Ab positive).      MRI of the brain performed October 2015 showed 1 focus not present on the December 2014 MRI.   2017 MRI of the brain was stable compared to 2016.   MRI of the cervical spine 2017 (after onset of numbness below neck) showed 4 foci (one not present in 2008) and MRi T-spine showed two foci, neither present in 2008.     Gait/strength:    Gait is worse and she notes more left sided clumsiness and weakness.   Balance is off.   She does not use a cane.    Legs has some spasticity which seems stable.    She feels she needs to look down to walk.     Gait is wider and slower.    Arms are fine as far as strength.   Balance is much worse with eyes closed.   She has trouble going downstairs and can't lead with the left leg.  She has trouble going upstairs if she has her hands full.  Numbness:   She is noting more numbness in both hands.  Numbness is below her neck bilaterally.       MS History:   She was diagnosed in 2007 after presenting with right sided numbness and mild clumsiness / allodynia.    MRI was consistent with MS and she was started with Betaseron.  She then switched  to Copaxone but had exacerbations and switched to Tysabri.     She converted to Tysabri but became JCV Ab positive and switched to Gilenya.   Due to exacerbations on Gilenya, she switched back to Tysabri x one year and then switched to Tecfidera (in 2014).     Bladder/Bowel:   She notes more urinary frequency and urgency.  No incontinence recently.  Fatigue/sleep:   She still has physical and cognitive fatigue.   She is sleepy around 2 pm and naps every afternoon x 1 hour.    She feels very refreshed after a one hour nap and does better the rest of the day.   She has Provigil  but never took it because the nap helps.   She falls asleep easily at night and sleeps 8 hours.   (9-5 am) .  Mood/Cogntion:   She denies any depression or anxiety.   She denies major cognitive problems but she notes reduced attention and focus at times.  She has no verbal fluencies issues.      REVIEW OF SYSTEMS: Constitutional: No fevers, chills, sweats, or change in appetite.   She has fatigue Eyes: No visual changes, double vision, eye pain Ear, nose and throat: No hearing loss, ear pain, nasal congestion, sore throat Cardiovascular: No chest pain, palpitations Respiratory: No shortness of breath at rest or with exertion.   No wheezes GastrointestinaI: No nausea, vomiting, diarrhea, abdominal pain, fecal incontinence Genitourinary: No dysuria, urinary retention or frequency.  No nocturia. Musculoskeletal: No neck pain, back pain Integumentary: No rash, pruritus, skin lesions Neurological: as above Psychiatric: No depression at this time.  No anxiety Endocrine: She has hypothyroidism.  No palpitations, diaphoresis, change in appetite, change in weigh or increased thirst Hematologic/Lymphatic: No anemia, purpura, petechiae. Allergic/Immunologic: No itchy/runny eyes, nasal congestion, recent allergic reactions, rashes  ALLERGIES: No Known Allergies  HOME MEDICATIONS:  Current Outpatient Medications:  .  Alpha-Lipoic Acid 600 MG TABS, , Disp: , Rfl:  .  Ascorbic Acid 500 MG/15ML SYRP, Take by mouth., Disp: , Rfl:  .  Biotin 100 MG/GM POWD, , Disp: , Rfl:  .  Cholecalciferol (VITAMIN D-3) 5000 units TABS, Take 1 tablet by mouth daily., Disp: , Rfl:  .  Dimethyl Fumarate (TECFIDERA) 240 MG CPDR, Take 1 capsule (240 mg total) by mouth 2 (two) times daily., Disp: 60 capsule, Rfl: 11 .  Magnesium (OPTIMAG 125) 125 MG CAPS, Take 2 capsules by mouth daily., Disp: , Rfl:  .  Multiple Vitamins-Minerals (MULTIVITAMIN ADULT PO), Take by mouth., Disp: , Rfl:  .  NALTREXONE HCL PO, Take 3.5  mg by mouth daily. Receives from specialty pharmacy, Disp: , Rfl:  .  NP THYROID 60 MG tablet, Take 1 tablet (60 mg total) by mouth daily before breakfast., Disp: 90 tablet, Rfl: 3 .  Nutritional Supplements (OPTICLEANSE GHI PO), Take by mouth., Disp: , Rfl:  .  Turmeric POWD, by Does not apply route., Disp: , Rfl:  .  Armodafinil 200 MG TABS, 1/2 to 1 pill po qAM (Patient not taking: Reported on 03/31/2019), Disp: 30 tablet, Rfl: 5 .  fluconazole (DIFLUCAN) 200 MG tablet, Take 200 mg by mouth daily., Disp: , Rfl:  No current facility-administered medications for this visit.  Facility-Administered Medications Ordered in Other Visits:  .  gadopentetate dimeglumine (MAGNEVIST) injection 13 mL, 13 mL, Intravenous, Once PRN, Deana Krock A, MD .  gadopentetate dimeglumine (MAGNEVIST) injection 13 mL, 13 mL, Intravenous, Once PRN, Kenston Longton, Nanine Means, MD .  gadopentetate  dimeglumine (MAGNEVIST) injection 13 mL, 13 mL, Intravenous, Once PRN, Natalea Sutliff, Nanine Means, MD  PAST MEDICAL HISTORY: Past Medical History:  Diagnosis Date  . Anemia   . Hypothyroidism    Dr. [name]  . Lyme disease   . Multiple sclerosis ([place])   . Neuromuscular disorder ([place])    MS  . Vision abnormalities     PAST SURGICAL HISTORY: Past Surgical History:  Procedure Laterality Date  . AUGMENTATION MAMMAPLASTY Bilateral 2003   saline  . BREAST SURGERY  2002  . COLONOSCOPY  2008  . DILATATION & CURRETTAGE/HYSTEROSCOPY WITH RESECTOCOPE N/A 10/09/2012   Procedure: DILATATION & CURETTAGE/HYSTEROSCOPY WITH RESECTOCOPE;  Surgeon: [name], MD;  Location: [facility];  Service: Gynecology;  Laterality: N/A;  RESECTION with [place]apoint   . fibroid  2019   fibroidectomy  . KNEE ARTHROSCOPY  1996  . KNEE ARTHROSCOPY  1996    FAMILY HISTORY: Family History  Problem Relation Age of Onset  . Hypertension Mother   . Arthritis Mother   . Colon cancer Father   . Thyroid cancer Sister   . Dementia Brother        Early  .  Heart attack Maternal Grandmother   . Hypertension Maternal Grandmother   . CAD Maternal Grandmother   . Diabetes Maternal Grandfather   . Lung cancer Maternal Grandfather   . Alzheimer's disease Paternal Grandmother   . Hypertension Paternal Grandmother   . Hyperlipidemia Paternal Grandmother   . Diabetes Paternal Grandfather   . Asthma Brother   . Thyroid cancer Brother     SOCIAL HISTORY:  Social History   Socioeconomic History  . Marital status: Married    Spouse name: Not on file  . Number of children: Not on file  . Years of education: Not on file  . Highest education level: Not on file  Occupational History  . Not on file  Tobacco Use  . Smoking status: Never Smoker  . Smokeless tobacco: Never Used  Substance and Sexual Activity  . Alcohol use: Yes    Comment: 2x month  . Drug use: No  . Sexual activity: Not on file  Other Topics Concern  . Not on file  Social History Narrative  . Not on file   Social Determinants of Health   Financial Resource Strain:   . Difficulty of Paying Living Expenses:   Food Insecurity:   . Worried About Charity fundraiser in the Last Year:   . Arboriculturist in the Last Year:   Transportation Needs:   . Film/video editor (Medical):   Marland Kitchen Lack of Transportation (Non-Medical):   Physical Activity:   . Days of Exercise per Week:   . Minutes of Exercise per Session:   Stress:   . Feeling of Stress :   Social Connections:   . Frequency of Communication with Friends and Family:   . Frequency of Social Gatherings with Friends and Family:   . Attends Religious Services:   . Active Member of Clubs or Organizations:   . Attends Archivist Meetings:   Marland Kitchen Marital Status:   Intimate Partner Violence:   . Fear of Current or Ex-Partner:   . Emotionally Abused:   Marland Kitchen Physically Abused:   . Sexually Abused:      PHYSICAL EXAM  Vitals:   03/31/19 1514  BP: 110/72  Pulse: 64  Temp: (!) 97.4 F (36.3 C)  Weight: 138  lb 8 oz (62.8 kg)  Height: 5\' 6"  (  1.676 m)    Body mass index is 22.35 kg/m.   General: The patient is well-developed and well-nourished and in no acute distress  Musculoskeletal:  Back is nontender  Neurologic Exam  Mental status: The patient is alert and oriented x 3 at the time of the examination. The patient has apparent normal recent and remote memory, with an apparently normal attention span and concentration ability.   Speech is normal.  Cranial nerves: Extraocular movements are full.  Trapezius and sternocleidomastoid strength is normal. No dysarthria is noted.  Normal facial strength.  Normal facial sensation.  No obvious hearing deficits are noted.  Motor:  Muscle bulk is normal.   She has mildly increased muscle tone in the legs, left greater than right.. Strength is  5 / 5 in arms and right leg.   Minimal left leg weakness   Sensory: Sensory testing is intact to pinprick, soft touch and vibration sensation in arms, reduced vibration in toes.       Coordination: She has good finger-nose-finger bilaterally.  Heel-to-shin is reduced, worse on the left.  Gait and station: Station is normal.   The gait is wide with a left foot drop (worse than last visit).  Tandem gait is wide.  Romberg is negative.   Reflexes: Deep tendon reflexes are brisk bilaterally. There is spread at the knees, left greater the right. No ankle clonus.       DIAGNOSTIC DATA (LABS, IMAGING, TESTING) - I reviewed patient records, labs, notes, testing and imaging myself where available.  Lab Results  Component Value Date   WBC 4.8 12/03/2018   HGB 14.0 12/03/2018   HCT 41.5 12/03/2018   MCV 93.3 12/03/2018   PLT 267.0 12/03/2018      Component Value Date/Time   NA 141 12/03/2018 1204   NA 145 (H) 07/25/2018 1054   K 4.2 12/03/2018 1204   CL 103 12/03/2018 1204   CO2 29 12/03/2018 1204   GLUCOSE 96 12/03/2018 1204   BUN 8 12/03/2018 1204   BUN 9 07/25/2018 1054   CREATININE 0.53 12/03/2018  1204   CALCIUM 9.8 12/03/2018 1204   PROT 7.6 12/03/2018 1204   PROT 7.2 07/25/2018 1054   ALBUMIN 4.6 12/03/2018 1204   ALBUMIN 5.1 (H) 07/25/2018 1054   AST 20 12/03/2018 1204   ALT 17 12/03/2018 1204   ALKPHOS 51 12/03/2018 1204   BILITOT 0.5 12/03/2018 1204   BILITOT <0.2 07/25/2018 1054   GFRNONAA 99 07/25/2018 1054   GFRAA 115 07/25/2018 1054      ASSESSMENT AND PLAN    1. Multiple sclerosis (HCC)   2. Gait disorder   3. Weakness of both lower extremities   4. Other fatigue   5. High risk medication use     1.   For now, she will continue Tecfidera.  She will have an MRI of the brain and cervical spine and thoracic spine performed to determine if there is any progression.  If this is occurring, we need to consider a different disease modifying therapy.   2.   Check lab work for CBC with differential, CMP and urinalysis/culture. 3.   She is advised to continue to be active and exercises as tolerated.  If she does not continue to improve, consider physical therapy. 4.   Return in 6 months or sooner if there are new or worsening neurologic symptoms or based on the results of the MRI.   40-minute evaluation with the majority of the time face-to-face discussing  her recent new neurologic symptoms.  Additional time was spent with record review and documentation.  Cortlan Dolin A. Felecia Shelling, MD, PhD [id number], 0000000 PM Certified in Neurology, Clinical Neurophysiology, Sleep Medicine, Pain Medicine and Neuroimaging  [facility] [address] [place], [place] [address] [phone]

## 2019-03-31 NOTE — Telephone Encounter (Signed)
Pt called requesting a sooner apt with MD but no soon apt's available at the time of call advised a message would be sent over to RN

## 2019-03-31 NOTE — Telephone Encounter (Signed)
Please see if we can get her in this week (this afternoon is fine0

## 2019-03-31 NOTE — Telephone Encounter (Signed)
Called pt. Scheduled appt for today at 330pm with Dr. Felecia Shelling. Advised her to make sure to wear a mask. She denies being exposed to anyone with covid-19 or having any sx of covid-19. She did receive 1st covid-19 vaccine recently.

## 2019-03-31 NOTE — Telephone Encounter (Signed)
Patient called on 03/29/2019 through with the after hours call service with the message that she has trouble walking.  I called her back and talk to her.  She reported that she has MS and that she is followed in this clinic.  She had more trouble walking.  She had no other new symptoms.  She was advised that if she feels acutely weak she may have to get checked out in the emergency room for a possible MS exacerbation.  She demonstrated understanding and agreement.  She felt comfortable monitoring her symptoms.  I advised her that I would let her neurologist know and if she needed a sooner appointment in this office she could always call us back.

## 2019-04-01 ENCOUNTER — Telehealth: Payer: Self-pay | Admitting: Neurology

## 2019-04-01 NOTE — Telephone Encounter (Signed)
Pt has called back to inform Dr Felecia Shelling that she got his messages and that as of last Thursday she has stopped taking the medications.

## 2019-04-01 NOTE — Telephone Encounter (Signed)
Her AST and ALT and alkaline phosphatase are elevated.  Of note, she started a quadruple antibiotic therapy for suspected chronic Lyme disease.  One of the medications is rifabutin (also azithromycin, nystatin and Ceftin).  This is the most likely cause of the liver toxicity.  I called but got voicemail left a message at 7:45 AM.  I called again at 840 a.m. and got the same voicemail and left another message.  I asked her to hold the antibiotics.

## 2019-04-01 NOTE — Telephone Encounter (Signed)
[facility] pending faxed notes.  

## 2019-04-02 LAB — COMPREHENSIVE METABOLIC PANEL
ALT: 951 IU/L (ref 0–32)
AST: 482 IU/L — ABNORMAL HIGH (ref 0–40)
Albumin/Globulin Ratio: 1.8 (ref 1.2–2.2)
Albumin: 4.2 g/dL (ref 3.8–4.9)
Alkaline Phosphatase: 251 IU/L — ABNORMAL HIGH (ref 39–117)
BUN/Creatinine Ratio: 15 (ref 9–23)
BUN: 8 mg/dL (ref 6–24)
Bilirubin Total: 0.3 mg/dL (ref 0.0–1.2)
CO2: 25 mmol/L (ref 20–29)
Calcium: 8.7 mg/dL (ref 8.7–10.2)
Chloride: 105 mmol/L (ref 96–106)
Creatinine, Ser: 0.55 mg/dL — ABNORMAL LOW (ref 0.57–1.00)
GFR calc Af Amer: 120 mL/min/{1.73_m2} (ref >59)
GFR calc non Af Amer: 104 mL/min/{1.73_m2} (ref >59)
Globulin, Total: 2.3 g/dL (ref 1.5–4.5)
Glucose: 92 mg/dL (ref 65–99)
Potassium: 4.1 mmol/L (ref 3.5–5.2)
Sodium: 144 mmol/L (ref 134–144)
Total Protein: 6.5 g/dL (ref 6.0–8.5)

## 2019-04-02 LAB — CBC WITH DIFFERENTIAL/PLATELET
Basophils Absolute: 0 10*3/uL (ref 0.0–0.2)
Basos: 1 %
EOS (ABSOLUTE): 0.1 10*3/uL (ref 0.0–0.4)
Eos: 3 %
Hematocrit: 37.4 % (ref 34.0–46.6)
Hemoglobin: 12.7 g/dL (ref 11.1–15.9)
Immature Grans (Abs): 0 10*3/uL (ref 0.0–0.1)
Immature Granulocytes: 1 %
Lymphocytes Absolute: 1.1 10*3/uL (ref 0.7–3.1)
Lymphs: 31 %
MCH: 31.3 pg (ref 26.6–33.0)
MCHC: 34 g/dL (ref 31.5–35.7)
MCV: 92 fL (ref 79–97)
Monocytes Absolute: 0.4 10*3/uL (ref 0.1–0.9)
Monocytes: 12 %
Neutrophils Absolute: 1.9 10*3/uL (ref 1.4–7.0)
Neutrophils: 52 %
Platelets: 143 10*3/uL — ABNORMAL LOW (ref 150–450)
RBC: 4.06 x10E6/uL (ref 3.77–5.28)
RDW: 12.5 % (ref 11.7–15.4)
WBC: 3.6 10*3/uL (ref 3.4–10.8)

## 2019-04-02 LAB — URINE CULTURE: Organism ID, Bacteria: NO GROWTH

## 2019-04-02 LAB — URINALYSIS, ROUTINE W REFLEX MICROSCOPIC
Bilirubin, UA: NEGATIVE
Glucose, UA: NEGATIVE
Nitrite, UA: NEGATIVE
RBC, UA: NEGATIVE
Specific Gravity, UA: 1.014 (ref 1.005–1.030)
Urobilinogen, Ur: 0.2 mg/dL (ref 0.2–1.0)
pH, UA: 6.5 (ref 5.0–7.5)

## 2019-04-02 LAB — MICROSCOPIC EXAMINATION
Bacteria, UA: NONE SEEN
Casts: NONE SEEN /lpf
Epithelial Cells (non renal): NONE SEEN /hpf (ref 0–10)

## 2019-04-03 ENCOUNTER — Other Ambulatory Visit: Payer: Self-pay | Admitting: *Deleted

## 2019-04-03 DIAGNOSIS — G35 Multiple sclerosis: Secondary | ICD-10-CM

## 2019-04-03 NOTE — Telephone Encounter (Signed)
New orders placed, thank you!

## 2019-04-03 NOTE — Telephone Encounter (Signed)
no to the covid questions MR Brain w/wo contrast, MR Cervical spine w/wo contrast & MR Thoracic spine w/wo contrast Dr. Jiles Prows AuthHP:[id number] (exp. 04/08/19 to 06/08/19). Patient is scheduled at [facility] for 04/08/19.

## 2019-04-03 NOTE — Telephone Encounter (Signed)
[facility] Josem KaufmannHP:[id number] (exp. 04/08/19 to 06/08/19)   When you get a chance can you put new orders in for the Brain and Cervical. Thank you!!

## 2019-04-08 ENCOUNTER — Other Ambulatory Visit: Payer: Self-pay

## 2019-04-08 ENCOUNTER — Ambulatory Visit: Payer: PRIVATE HEALTH INSURANCE

## 2019-04-08 DIAGNOSIS — G35 Multiple sclerosis: Secondary | ICD-10-CM

## 2019-04-08 MED ORDER — GADOBENATE DIMEGLUMINE 529 MG/ML IV SOLN
13.0000 mL | Freq: Once | INTRAVENOUS | Status: AC | PRN
  Administered 2019-04-08: 16:00:00 13 mL via INTRAVENOUS

## 2019-04-14 ENCOUNTER — Other Ambulatory Visit: Payer: Self-pay

## 2019-04-16 ENCOUNTER — Other Ambulatory Visit: Payer: Self-pay

## 2019-04-16 ENCOUNTER — Ambulatory Visit: Payer: PRIVATE HEALTH INSURANCE | Admitting: Internal Medicine

## 2019-04-16 ENCOUNTER — Encounter: Payer: Self-pay | Admitting: Internal Medicine

## 2019-04-16 VITALS — BP 112/70 | HR 75 | Ht 66.0 in | Wt 141.6 lb

## 2019-04-16 DIAGNOSIS — E039 Hypothyroidism, unspecified: Secondary | ICD-10-CM

## 2019-04-16 LAB — TSH: TSH: 0.98 u[IU]/mL (ref 0.35–4.50)

## 2019-04-16 LAB — T4, FREE: Free T4: 5.19 ng/dL — ABNORMAL HIGH (ref 0.60–1.60)

## 2019-04-16 LAB — T3, FREE: T3, Free: 18.2 pg/mL — ABNORMAL HIGH (ref 2.3–4.2)

## 2019-04-16 NOTE — Progress Notes (Signed)
Patient ID: Samantha Church, female   DOB: 03/27/1960, 59 y.o.   MRN: [id number]   This visit occurred during the SARS-CoV-2 public health emergency.  Safety protocols were in place, including screening questions prior to the visit, additional usage of staff PPE, and extensive cleaning of exam room while observing appropriate contact time as indicated for disinfecting solutions.   HPI  Samantha Church is a 59 y.o.-year-old female, presenting for follow-up for hypothyroidism.  Last visit 1 year ago (virtual).  Pt. has been dx with hypothyroidism in 2013 >> she was initially on Synthroid, then changed to NP thyroid 60 mg daily at [facility]  She is seeing [facility] for Lyme ds. Liver tests were recently elevated >> stopped the ABx for Lyme ds.  She takes the NP thyroid - in am - fasting - + Coffee with butter at least 30 minutes later - at least 30 min from b'fast - no Ca, Fe, MVI, PPIs - on Biotin very high dose (100,000 mcg 1-2 daily) - for MS On vitamin C, D, Mg, Opticleanse, ALA (also for MS), NRF2 activator, Turmeric.  Reviewed her TFTs: Lab Results  Component Value Date   TSH 0.76 12/03/2018   TSH 0.77 07/29/2018   TSH 0.70 05/07/2017   TSH 0.75 11/07/2016   TSH 0.88 06/27/2016   TSH 1.150 05/14/2015   TSH 1.87 11/24/2010   FREET4 1.39 12/03/2018   FREET4 1.1 07/29/2018   FREET4 0.78 05/07/2017   FREET4 0.85 11/07/2016    Investigation for Hashimoto's thyroiditis was negative: Component     Latest Ref Rng & Units 11/07/2016  Thyroglobulin Ab     < or = 1 IU/mL <1  Thyroperoxidase Ab SerPl-aCnc     <9 IU/mL <1   At last visit she was still complaining of: Fatigue, hot flashes, occasional palpitations. She continues to have the symptoms now.  Pt denies: - feeling nodules in neck - hoarseness - dysphagia - choking - SOB with lying down  Thyroid U.S (05/07/2007) report reviewed: Normal  No family history of hypo or  hyperthyroidism but + FH of thyroid cancer in brother and sister: PTC. No h/o radiation tx to head or neck.  No seaweed or kelp. No recent contrast studies. No new herbal supplements. No recent steroids use.   She got her breast implants removed 02/2019.  ROS: Constitutional: no weight gain/no weight loss, + see HPI Eyes: no blurry vision, no xerophthalmia ENT: no sore throat, + see HPI Cardiovascular: no CP/no SOB/+ palpitations/no leg swelling Respiratory: no cough/no SOB/no wheezing Gastrointestinal: no N/no V/no D/no C/no acid reflux Musculoskeletal: no muscle aches/+ joint aches Skin: no rashes, no hair loss Neurological: no tremors/no numbness/no tingling/no dizziness  I reviewed pt's medications, allergies, PMH, social hx, family hx, and changes were documented in the history of present illness. Otherwise, unchanged from my initial visit note.  Past Medical History:  Diagnosis Date  . Anemia   . Hypothyroidism    Dr. Altheimer  . Lyme disease   . Multiple sclerosis (HCC)   . Neuromuscular disorder (HCC)    MS  . Vision abnormalities    Past Surgical History:  Procedure Laterality Date  . AUGMENTATION MAMMAPLASTY Bilateral 2003   saline  . BREAST SURGERY  2002  . COLONOSCOPY  2008  . DILATATION & CURRETTAGE/HYSTEROSCOPY WITH RESECTOCOPE N/A 10/09/2012   Procedure: DILATATION & CURETTAGE/HYSTEROSCOPY WITH RESECTOCOPE;  Surgeon: Gretchen Adkins, MD;  Location: [facility];  Service: Gynecology;    Laterality: N/A;  RESECTION with Versapoint   . fibroid  2019   fibroidectomy  . KNEE ARTHROSCOPY  1996  . KNEE ARTHROSCOPY  1996   Social History   Social History  . Marital status: Married    Spouse name: N/A  . Number of children: 4   Occupational History  . Accounting   Social History Main Topics  . Smoking status: Never Smoker  . Smokeless tobacco: Never Used  . Alcohol use Yes     Comment: wine, nightly  . Drug use: No   Current Outpatient Medications on File  Prior to Visit  Medication Sig Dispense Refill  . Alpha-Lipoic Acid 600 MG TABS     . Armodafinil 200 MG TABS 1/2 to 1 pill po qAM (Patient not taking: Reported on 03/31/2019) 30 tablet 5  . Ascorbic Acid 500 MG/15ML SYRP Take by mouth.    . Biotin 100 MG/GM POWD     . Cholecalciferol (VITAMIN D-3) 5000 units TABS Take 1 tablet by mouth daily.    . Dimethyl Fumarate (TECFIDERA) 240 MG CPDR Take 1 capsule (240 mg total) by mouth 2 (two) times daily. 60 capsule 11  . fluconazole (DIFLUCAN) 200 MG tablet Take 200 mg by mouth daily.    . Magnesium (OPTIMAG 125) 125 MG CAPS Take 2 capsules by mouth daily.    . Multiple Vitamins-Minerals (MULTIVITAMIN ADULT PO) Take by mouth.    . NALTREXONE HCL PO Take 3.5 mg by mouth daily. Receives from specialty pharmacy    . NP THYROID 60 MG tablet Take 1 tablet (60 mg total) by mouth daily before breakfast. 90 tablet 3  . Nutritional Supplements (OPTICLEANSE GHI PO) Take by mouth.    . Turmeric POWD by Does not apply route.     Current Facility-Administered Medications on File Prior to Visit  Medication Dose Route Frequency Provider Last Rate Last Admin  . gadopentetate dimeglumine (MAGNEVIST) injection 13 mL  13 mL Intravenous Once PRN Sater, Richard A, MD      . gadopentetate dimeglumine (MAGNEVIST) injection 13 mL  13 mL Intravenous Once PRN Sater, Richard A, MD      . gadopentetate dimeglumine (MAGNEVIST) injection 13 mL  13 mL Intravenous Once PRN Sater, Richard A, MD       No Known Allergies Family History  Problem Relation Age of Onset  . Hypertension Mother   . Arthritis Mother   . Colon cancer Father   . Thyroid cancer Sister   . Dementia Brother        Early  . Heart attack Maternal Grandmother   . Hypertension Maternal Grandmother   . CAD Maternal Grandmother   . Diabetes Maternal Grandfather   . Lung cancer Maternal Grandfather   . Alzheimer's disease Paternal Grandmother   . Hypertension Paternal Grandmother   . Hyperlipidemia  Paternal Grandmother   . Diabetes Paternal Grandfather   . Asthma Brother   . Thyroid cancer Brother    Also, DM in PGF; HL, heart ds in M.  PE: BP 112/70 (BP Location: Left Arm, Patient Position: Sitting, Cuff Size: Normal)   Pulse 75   Ht 5' 6" (1.676 m)   Wt 141 lb 9.6 oz (64.2 kg)   SpO2 98%   BMI 22.85 kg/m  Wt Readings from Last 3 Encounters:  04/16/19 141 lb 9.6 oz (64.2 kg)  03/31/19 138 lb 8 oz (62.8 kg)  01/27/19 145 lb 8 oz (66 kg)   Constitutional: normal weight, in NAD Eyes:   PERRLA, EOMI, no exophthalmos ENT: moist mucous membranes, no thyromegaly, no cervical lymphadenopathy Cardiovascular: RRR, No MRG Respiratory: CTA B Gastrointestinal: abdomen soft, NT, ND, BS+ Musculoskeletal: no deformities, strength intact in all 4 Skin: moist, warm, no rashes Neurological: no tremor with outstretched hands, DTR normal in all 4  ASSESSMENT: 1. Acquired Hypothyroidism  PLAN:  1. Patient with longstanding nonautoimmune hypothyroidism, on desiccated thyroid extract - latest thyroid labs reviewed with pt >> normal: Lab Results  Component Value Date   TSH 0.76 12/03/2018   - she continues on NP thyroid 60 Mg daily - pt feels good on this dose.  At last visit she complained of fatigue in the afternoon (? If from her MS) - we discussed about taking the thyroid hormone every day, with water, >30 minutes before breakfast, separated by >4 hours from acid reflux medications, calcium, iron, multivitamins. Pt. is taking it correctly. - will check thyroid tests today: TSH, free T3 and fT4 - at last visit, she just started high-dose biotin for MS so we discussed that she needs to be off this for at least 2 weeks before checking labs. She stopped Biotin 2 weeks ago. - If labs are abnormal, she will need to return for repeat TFTs in 1.5 months - OTW, I will see her back in a year  Needs refills.  Office Visit on 04/16/2019  Component Date Value Ref Range Status  . TSH 04/16/2019  0.98  0.35 - 4.50 uIU/mL Final  . Free T4 04/16/2019 5.19* 0.60 - 1.60 ng/dL Final   Comment: Specimens from patients who are undergoing biotin therapy and /or ingesting biotin supplements may contain high levels of biotin.  The higher biotin concentration in these specimens interferes with this Free T4 assay.  Specimens that contain high levels  of biotin may cause false high results for this Free T4 assay.  Please interpret results in light of the total clinical presentation of the patient.    . T3, Free 04/16/2019 18.2* 2.3 - 4.2 pg/mL Final   Quite unusual tests, I suspect due to taking NP thyroid right before the blood draw.  For now, I would suggest to continue the current dose.   Cristina Gherghe, MD PhD [facility]   

## 2019-04-16 NOTE — Patient Instructions (Signed)
Please continue NP thyroid 60 mg daily.  Take the thyroid hormone every day, with water, at least 30 minutes before breakfast, separated by at least 4 hours from: - acid reflux medications - calcium - iron - multivitamins  Please stop at the lab.  Please come back for a follow-up appointment in 1 year.

## 2019-05-14 ENCOUNTER — Encounter: Payer: Self-pay | Admitting: Family Medicine

## 2019-05-14 ENCOUNTER — Other Ambulatory Visit: Payer: Self-pay

## 2019-05-14 ENCOUNTER — Other Ambulatory Visit (HOSPITAL_COMMUNITY)
Admission: RE | Admit: 2019-05-14 | Discharge: 2019-05-14 | Disposition: A | Discharge status: Home / Self Care | Payer: PRIVATE HEALTH INSURANCE | Source: Ambulatory Visit | Attending: Family Medicine | Admitting: Family Medicine

## 2019-05-14 ENCOUNTER — Ambulatory Visit (INDEPENDENT_AMBULATORY_CARE_PROVIDER_SITE_OTHER): Payer: PRIVATE HEALTH INSURANCE | Admitting: Family Medicine

## 2019-05-14 VITALS — BP 110/80 | HR 65 | Temp 97.7°F | Ht 66.0 in | Wt 139.4 lb

## 2019-05-14 DIAGNOSIS — N898 Other specified noninflammatory disorders of vagina: Secondary | ICD-10-CM | POA: Insufficient documentation

## 2019-05-14 DIAGNOSIS — G35 Multiple sclerosis: Secondary | ICD-10-CM | POA: Diagnosis not present

## 2019-05-14 MED ORDER — FLUCONAZOLE 150 MG PO TABS: 150.0000 mg | ORAL_TABLET | Freq: Once | ORAL | 0 refills | Status: AC

## 2019-05-14 NOTE — Progress Notes (Signed)
Samantha Church is a 59 y.o. female  Chief Complaint  Patient presents with  . Establish Care    Pt here for a TOC visit.    HPI: TAHNEE Church is a 59 y.o. female here for [facility] appt, previous PCP Dr. [name].  She follows with neurology Dr. Felecia Shelling for MS, endocrinology [place]. She also sees [name] at [facility] for Lyme. She was on rifampin for Lyme but neurologist stopped this d/t elevated LFTs.   Pt complains of vaginal itching, discomfort x 1 week.  No vaginal discharge. No dysuria, frequency, urgency, gross hematuria. No vaginal bleeding.  Pt has been using monistat x 4 days  Past Medical History:  Diagnosis Date  . Anemia   . Hypothyroidism    Dr. [name]  . Lyme disease   . Multiple sclerosis ([place])   . Neuromuscular disorder ([place])    MS  . Vision abnormalities     Past Surgical History:  Procedure Laterality Date  . AUGMENTATION MAMMAPLASTY Bilateral 2003   saline  . BREAST IMPLANT REMOVAL Bilateral 02/17/2019  . BREAST SURGERY  2002  . COLONOSCOPY  2008  . DILATATION & CURRETTAGE/HYSTEROSCOPY WITH RESECTOCOPE N/A 10/09/2012   Procedure: DILATATION & CURETTAGE/HYSTEROSCOPY WITH RESECTOCOPE;  Surgeon: [name], MD;  Location: [facility];  Service: Gynecology;  Laterality: N/A;  RESECTION with Versapoint   . fibroid  2019   fibroidectomy  . KNEE ARTHROSCOPY  1996  . KNEE ARTHROSCOPY  1996    Social History   Socioeconomic History  . Marital status: Married    Spouse name: Not on file  . Number of children: Not on file  . Years of education: Not on file  . Highest education level: Not on file  Occupational History  . Not on file  Tobacco Use  . Smoking status: Never Smoker  . Smokeless tobacco: Never Used  Substance and Sexual Activity  . Alcohol use: Yes    Comment: 2x month  . Drug use: No  . Sexual activity: Not on file  Other Topics Concern  . Not on file  Social History Narrative  . Not on file   Social Determinants of  Health   Financial Resource Strain:   . Difficulty of Paying Living Expenses:   Food Insecurity:   . Worried About Charity fundraiser in the Last Year:   . Arboriculturist in the Last Year:   Transportation Needs:   . Film/video editor (Medical):   Marland Kitchen Lack of Transportation (Non-Medical):   Physical Activity:   . Days of Exercise per Week:   . Minutes of Exercise per Session:   Stress:   . Feeling of Stress :   Social Connections:   . Frequency of Communication with Friends and Family:   . Frequency of Social Gatherings with Friends and Family:   . Attends Religious Services:   . Active Member of Clubs or Organizations:   . Attends Archivist Meetings:   Marland Kitchen Marital Status:   Intimate Partner Violence:   . Fear of Current or Ex-Partner:   . Emotionally Abused:   Marland Kitchen Physically Abused:   . Sexually Abused:     Family History  Problem Relation Age of Onset  . Hypertension Mother   . Arthritis Mother   . Colon cancer Father   . Thyroid cancer Sister   . Dementia Brother        Early  . Heart attack Maternal Grandmother   .  Hypertension Maternal Grandmother   . CAD Maternal Grandmother   . Diabetes Maternal Grandfather   . Lung cancer Maternal Grandfather   . Alzheimer's disease Paternal Grandmother   . Hypertension Paternal Grandmother   . Hyperlipidemia Paternal Grandmother   . Diabetes Paternal Grandfather   . Asthma Brother   . Thyroid cancer Brother      Immunization History  Administered Date(s) Administered  . Influenza-Unspecified 11/20/2018  . Tdap 11/24/2010    Outpatient Encounter Medications as of 05/14/2019  Medication Sig Note  . Alpha-Lipoic Acid 600 MG TABS    . Ascorbic Acid 500 MG/15ML SYRP Take by mouth. 10/24/2016: Sts. is taking one tablet daily/fim  . Biotin 100 MG/GM POWD    . Cholecalciferol (VITAMIN D-3) 5000 units TABS Take 1 tablet by mouth daily.   . Dimethyl Fumarate (TECFIDERA) 240 MG CPDR Take 1 capsule (240 mg total)  by mouth 2 (two) times daily.   . Multiple Vitamins-Minerals (MULTIVITAMIN ADULT PO) Take by mouth.   Marland Kitchen NALTREXONE HCL PO Take 3.5 mg by mouth daily. Receives from specialty pharmacy   . NP THYROID 60 MG tablet Take 1 tablet (60 mg total) by mouth daily before breakfast.   . Nutritional Supplements (OPTICLEANSE GHI PO) Take by mouth.   . Turmeric POWD by Does not apply route.   . Armodafinil 200 MG TABS 1/2 to 1 pill po qAM (Patient not taking: Reported on 05/14/2019)   . fluconazole (DIFLUCAN) 200 MG tablet Take 200 mg by mouth daily.   . Magnesium (OPTIMAG 125) 125 MG CAPS Take 2 capsules by mouth daily.    Facility-Administered Encounter Medications as of 05/14/2019  Medication  . gadopentetate dimeglumine (MAGNEVIST) injection 13 mL  . gadopentetate dimeglumine (MAGNEVIST) injection 13 mL  . gadopentetate dimeglumine (MAGNEVIST) injection 13 mL     ROS: Pertinent positives and negatives noted in HPI. Remainder of ROS non-contributory   No Known Allergies  BP 110/80 (BP Location: Left Arm, Patient Position: Sitting, Cuff Size: Normal)   Pulse 65   Temp 97.7 F (36.5 C) (Temporal)   Ht 5\' 6"  (1.676 m)   Wt 139 lb 6.4 oz (63.2 kg)   LMP 03/01/2016   SpO2 99%   BMI 22.50 kg/m   Physical Exam  Constitutional: She is oriented to person, place, and time. She appears well-developed and well-nourished. No distress.  Pulmonary/Chest: No respiratory distress.  Musculoskeletal:        General: No edema.  Neurological: She is alert and oriented to person, place, and time.  Psychiatric: She has a normal mood and affect. Her behavior is normal.     A/P:  1. Vaginal itching - Urine cytology ancillary only([facility]) Rx: - fluconazole (DIFLUCAN) 150 MG tablet; Take 1 tablet (150 mg total) by mouth once for 1 dose. 1 tab po x 1 dose, may repeat x 1 dose 48-72hrs later  Dispense: 2 tablet; Refill: 0 - will contact pt when result available Discussed plan and reviewed medications with  patient, including risks, benefits, and potential side effects. Pt expressed understand. All questions answered.   2. MULTIPLE SCLEROSIS - follows with neurology Dr. Felecia Shelling

## 2019-05-15 ENCOUNTER — Other Ambulatory Visit: Payer: Self-pay | Admitting: Family Medicine

## 2019-05-19 ENCOUNTER — Encounter: Payer: Self-pay | Admitting: *Deleted

## 2019-05-19 ENCOUNTER — Encounter: Payer: Self-pay | Admitting: Family Medicine

## 2019-05-19 NOTE — Telephone Encounter (Signed)
Please see message and advise.  Thank you. ° °

## 2019-05-20 LAB — URINE CYTOLOGY ANCILLARY ONLY
Bacterial vaginitis: NEGATIVE
Candida vaginitis: NEGATIVE

## 2019-05-21 LAB — URINE CYTOLOGY ANCILLARY ONLY
Bacterial Vaginitis-Urine: NEGATIVE
Candida Urine: NEGATIVE

## 2019-06-02 ENCOUNTER — Other Ambulatory Visit: Payer: Self-pay | Admitting: *Deleted

## 2019-06-02 DIAGNOSIS — G35 Multiple sclerosis: Secondary | ICD-10-CM

## 2019-06-02 MED ORDER — DIMETHYL FUMARATE 240 MG PO CPDR: 240.0000 mg | DELAYED_RELEASE_CAPSULE | Freq: Two times a day (BID) | ORAL | 11 refills | Status: DC

## 2019-06-02 NOTE — Progress Notes (Signed)
Faxed printed/signed rx dimethyl fumarate to [facility]. Received fax confirmation.

## 2019-07-09 ENCOUNTER — Ambulatory Visit (INDEPENDENT_AMBULATORY_CARE_PROVIDER_SITE_OTHER): Payer: PRIVATE HEALTH INSURANCE

## 2019-07-09 ENCOUNTER — Encounter: Payer: Self-pay | Admitting: Podiatry

## 2019-07-09 ENCOUNTER — Ambulatory Visit (INDEPENDENT_AMBULATORY_CARE_PROVIDER_SITE_OTHER): Payer: PRIVATE HEALTH INSURANCE | Admitting: Podiatry

## 2019-07-09 ENCOUNTER — Other Ambulatory Visit: Payer: Self-pay

## 2019-07-09 DIAGNOSIS — M79671 Pain in right foot: Secondary | ICD-10-CM

## 2019-07-09 DIAGNOSIS — G5761 Lesion of plantar nerve, right lower limb: Secondary | ICD-10-CM | POA: Diagnosis not present

## 2019-07-09 DIAGNOSIS — M7741 Metatarsalgia, right foot: Secondary | ICD-10-CM

## 2019-07-09 DIAGNOSIS — M7751 Other enthesopathy of right foot: Secondary | ICD-10-CM

## 2019-07-09 MED ORDER — METHYLPREDNISOLONE 4 MG PO TBPK
ORAL_TABLET | ORAL | 0 refills | Status: DC
Start: 2019-07-09 — End: 2019-07-28

## 2019-07-09 MED ORDER — MELOXICAM 15 MG PO TABS: 15.0000 mg | ORAL_TABLET | Freq: Every day | ORAL | 1 refills | Status: DC

## 2019-07-09 NOTE — Progress Notes (Signed)
   HPI: 59 y.o. female presenting today for evaluation of right forefoot pain.  She has been seen by Dr. Gershon Mussel, local podiatrist, for the right foot pain and was referred here.  She has been dealing with this right forefoot pain off and on for the past 8 years.  She has tried different shoes and OTC insoles without any significant alleviation of her symptoms.  She is also received anti-inflammatory steroid injections which have not improved her symptoms.  She presents for further treatment evaluation  Past Medical History:  Diagnosis Date  . Anemia   . Hypothyroidism    Dr. [name]  . Lyme disease   . Multiple sclerosis ([place])   . Neuromuscular disorder ([place])    MS  . Vision abnormalities      Physical Exam: General: The patient is alert and oriented x3 in no acute distress.  Dermatology: Skin is warm, dry and supple bilateral lower extremities. Negative for open lesions or macerations.  Vascular: Palpable pedal pulses bilaterally. No edema or erythema noted. Capillary refill within normal limits.  Neurological: Epicritic and protective threshold grossly intact bilaterally.   Musculoskeletal Exam: Range of motion within normal limits to all pedal and ankle joints bilateral. Muscle strength 5/5 in all groups bilateral.  There is pain on palpation and range of motion to the first through fourth MTPJ of the right foot.  There is also pain to the associated intermetatarsal spaces as well.  Radiographic Exam:  Normal osseous mineralization. Joint spaces preserved. No fracture/dislocation/boney destruction.    Assessment: 1.  Right forefoot metatarsalgia-chronic   Plan of Care:  1. Patient evaluated. X-Rays reviewed.  2.  Today we will forego steroidal injections since she is not had any improvement with them in the past 3.  Prescription for Medrol Dosepak 4.  Prescription for meloxicam 15 mg daily 5.  Cam boot dispensed today.  Weightbearing as tolerated 6.  Appointment with  Pedorthist for custom molded insoles 7.  Return to clinic in 3 weeks  *Patient works part-time on her feet   Edrick Kins, DPM [facility]  Dr. Edrick Kins, DPM    [address], [place] [address]                Office [phone]  Fax [phone]

## 2019-07-17 ENCOUNTER — Other Ambulatory Visit: Payer: Self-pay | Admitting: Podiatry

## 2019-07-17 DIAGNOSIS — M7741 Metatarsalgia, right foot: Secondary | ICD-10-CM

## 2019-07-28 ENCOUNTER — Ambulatory Visit: Payer: PRIVATE HEALTH INSURANCE | Admitting: Podiatry

## 2019-07-28 ENCOUNTER — Encounter: Payer: Self-pay | Admitting: Neurology

## 2019-07-28 ENCOUNTER — Ambulatory Visit: Payer: PRIVATE HEALTH INSURANCE | Admitting: Neurology

## 2019-07-28 VITALS — BP 111/71 | HR 67 | Ht 66.0 in | Wt 143.5 lb

## 2019-07-28 DIAGNOSIS — R5383 Other fatigue: Secondary | ICD-10-CM

## 2019-07-28 DIAGNOSIS — R269 Unspecified abnormalities of gait and mobility: Secondary | ICD-10-CM | POA: Diagnosis not present

## 2019-07-28 DIAGNOSIS — Z79899 Other long term (current) drug therapy: Secondary | ICD-10-CM | POA: Diagnosis not present

## 2019-07-28 DIAGNOSIS — G35 Multiple sclerosis: Secondary | ICD-10-CM

## 2019-07-28 DIAGNOSIS — R29898 Other symptoms and signs involving the musculoskeletal system: Secondary | ICD-10-CM

## 2019-07-28 NOTE — Progress Notes (Signed)
[facility]  PATIENT: Samantha Church DOB: [birth year]  REFERRING DOCTOR OR PCP:  Donald Prose SOURCE: Patient and records from [facility] neurology.  _________________________________   HISTORICAL  CHIEF COMPLAINT:  Chief Complaint  Patient presents with  . Follow-up    RM 12, alone. Last sen 03/31/2019. She has not started armodafinil yet.   . Multiple Sclerosis    On Tecfidera.     HISTORY OF PRESENT ILLNESS:  Samantha Church is a 59 year old woman with relapsing remitting multiple sclerosis diagnosed in 2007.    Update 07/28/2019 She is on Tecfidera as her DMT.   She has tolerated it well.    She is noting more difficulties with her left leg - weakness, spastic and less coordinated.   Her left hand is a little uncoordinated and slightly weak in her hand.    Bladder function is the same with urgency but no incontinence.  Vision is doing well.     She has a lot of fatigue and naps most afternoons.   She sleeps ok some nights but other nights sleep seems less restorative and she is up some.    Mood is doing about the same.    She has a right boot for inflammation.  She will be getting an orthotic soon.   When better we discussed PT to build up her strength and gait again.      Update 03/31/2019: She woke up Friday morning and was very weak. Weakness was worse in both legs.   Her left leg (weaker in general) was worse than the right.   Her vision seemed blurry.      She was off balanced and weaker the rest of that day and Saturday..  She could barely walk on Friday and Saturday and slept a lot on Sunday.  She had incontinence but was unable to get to the bathroom in time (has baseline urgency).      She improved over the weekend but is still not at her baseline.    For MS, she is on Tecfidera.  She tolerates it well.  Her last brain MRI was in 2017.  She felt best while on Tysabri but was JCV antibody positive.  She was recently placed on Rifabutin, azithromycin,  Nysatatin and Ceftin for a diagnosis of Lyme disease.  She has not had any diarrhea or vomiting.  She does not feel bladder function has changed.  She notes no shortness of breath or fevers.   Update 01/27/2019: She denies any exacerbations and tolerates Tecfidera well.   She has noted slow worsening with gait -- speed and dragging left foot some.    She is not as active as she had been.   She is walking less.    Her last MRI for the brain in 2017 was unchanged and C-spine in 2018 was unchanged.     She did best on Tysabri but was JCV Ab high positive.   She is also on LDN but is uncertain whether it is helping.     She has a left foot drop.  No falls.    She ha mild left leg weakness and foot drop.   She denies painful tingling in her legs.    She has urinary frequency.     Vision is doing well.   She has fatigue many days.   She sleeps 8 hours at night and another 60-90 minutes   Update 07/25/2018: She is on Tecfidera and feels her gait is  doing a little worse since the last visit.  The changes been gradual.  There has not been any definite exacerbation.  She tolerates the Tecfidera well..   She is doing exercises with PT.   Her left side is weaker than the right- arm, hand and leg.    She is noticing the left foot drop more.  She uses a cane to ambulate.  She has urinary frequency and urgency, about the same as last visit.    She notes some fatigue.  She notes cognitive issues, similar to last visit.  Mood is doing well.  She has been on Tecfidera for about 6 years.  Initially, she was on Betaseron and then she switched to Tysabri but became JCV positive and switched to [place].   She had exacerbation while on Gilenya and went back on the Tysabri and then went on Tecfidera.Marland Kitchen     Update 10/24/2016::   She notes that the left leg is not moving as well.    She thinks the onset was gradual and has continued to progress.    She feels she does not have complete control over the leg.   She is concerned it  may buckle.     Her left side has always been the worse.   Though gait feels mildly unsteady, she has no falls.    She is now using a cane for longer distances.   Bladder function is unchanged.  She notes some fatigue but this is stable. No change in cognition.  We reiviewed the MRI of the cervical spine from 04/26/2016.  She has multiple small lesions within the spinal cord including 3 on the left. When compared to the MRI from last year, there is no definite change.                                                                                                                                                   From 04/13/2016:   MS:  She is on Tecfidera and tolerates it well.   She switched to it from [place] in 2014 (was on Tysabri but converted to JCV Ab positive).      MRI of the brain performed October 2015 showed 1 focus not present on the December 2014 MRI.   2017 MRI of the brain was stable compared to 2016.   MRI of the cervical spine 2017 (after onset of numbness below neck) showed 4 foci (one not present in 2008) and MRi T-spine showed two foci, neither present in 2008.     Gait/strength:    Gait is worse and she notes more left sided clumsiness and weakness.   Balance is off.   She does not use a cane.    Legs has some spasticity which seems stable.    She feels she needs to look down to walk.  Gait is wider and slower.    Arms are fine as far as strength.   Balance is much worse with eyes closed.   She has trouble going downstairs and can't lead with the left leg.  She has trouble going upstairs if she has her hands full.  Numbness:   She is noting more numbness in both hands.  Numbness is below her neck bilaterally.       MS History:   She was diagnosed in 2007 after presenting with right sided numbness and mild clumsiness / allodynia.    MRI was consistent with MS and she was started with Betaseron.  She then switched to Copaxone but had exacerbations and switched to Tysabri.     She  converted to Tysabri but became JCV Ab positive and switched to Gilenya.   Due to exacerbations on Gilenya, she switched back to Tysabri x one year and then switched to Tecfidera (in 2014).     Bladder/Bowel:   She notes more urinary frequency and urgency.  No incontinence recently.  Fatigue/sleep:   She still has physical and cognitive fatigue.   She is sleepy around 2 pm and naps every afternoon x 1 hour.    She feels very refreshed after a one hour nap and does better the rest of the day.   She has Provigil but never took it because the nap helps.   She falls asleep easily at night and sleeps 8 hours.   (9-5 am) .  Mood/Cogntion:   She denies any depression or anxiety.   She denies major cognitive problems but she notes reduced attention and focus at times.  She has no verbal fluencies issues.      REVIEW OF SYSTEMS: Constitutional: No fevers, chills, sweats, or change in appetite.   She has fatigue Eyes: No visual changes, double vision, eye pain Ear, nose and throat: No hearing loss, ear pain, nasal congestion, sore throat Cardiovascular: No chest pain, palpitations Respiratory: No shortness of breath at rest or with exertion.   No wheezes GastrointestinaI: No nausea, vomiting, diarrhea, abdominal pain, fecal incontinence Genitourinary: No dysuria, urinary retention or frequency.  No nocturia. Musculoskeletal: No neck pain, back pain Integumentary: No rash, pruritus, skin lesions Neurological: as above Psychiatric: No depression at this time.  No anxiety Endocrine: She has hypothyroidism.  No palpitations, diaphoresis, change in appetite, change in weigh or increased thirst Hematologic/Lymphatic: No anemia, purpura, petechiae. Allergic/Immunologic: No itchy/runny eyes, nasal congestion, recent allergic reactions, rashes  ALLERGIES: No Known Allergies  HOME MEDICATIONS:  Current Outpatient Medications:  .  Alpha-Lipoic Acid 600 MG TABS, , Disp: , Rfl:  .  Ascorbic Acid 500  MG/15ML SYRP, Take by mouth., Disp: , Rfl:  .  Biotin 100 MG/GM POWD, , Disp: , Rfl:  .  Cholecalciferol (VITAMIN D-3) 5000 units TABS, Take 1 tablet by mouth daily., Disp: , Rfl:  .  Dimethyl Fumarate (TECFIDERA) 240 MG CPDR, Take 1 capsule (240 mg total) by mouth 2 (two) times daily., Disp: 60 capsule, Rfl: 11 .  Magnesium (OPTIMAG 125) 125 MG CAPS, Take 2 capsules by mouth daily., Disp: , Rfl:  .  meloxicam (MOBIC) 15 MG tablet, Take 1 tablet (15 mg total) by mouth daily., Disp: 30 tablet, Rfl: 1 .  Multiple Vitamins-Minerals (MULTIVITAMIN ADULT PO), Take by mouth., Disp: , Rfl:  .  NALTREXONE HCL PO, Take 3.5 mg by mouth daily. Receives from specialty pharmacy, Disp: , Rfl:  .  NP THYROID 60 MG tablet, Take  1 tablet (60 mg total) by mouth daily before breakfast., Disp: 90 tablet, Rfl: 3 .  Nutritional Supplements (OPTICLEANSE GHI PO), Take by mouth., Disp: , Rfl:  .  Turmeric POWD, by Does not apply route., Disp: , Rfl:  .  Armodafinil 200 MG TABS, 1/2 to 1 pill po qAM (Patient not taking: Reported on 05/14/2019), Disp: 30 tablet, Rfl: 5 No current facility-administered medications for this visit.  Facility-Administered Medications Ordered in Other Visits:  .  gadopentetate dimeglumine (MAGNEVIST) injection 13 mL, 13 mL, Intravenous, Once PRN, Bettyjo Lundblad A, MD .  gadopentetate dimeglumine (MAGNEVIST) injection 13 mL, 13 mL, Intravenous, Once PRN, Aliea Bobe A, MD .  gadopentetate dimeglumine (MAGNEVIST) injection 13 mL, 13 mL, Intravenous, Once PRN, Jonquil Stubbe, Nanine Means, MD  PAST MEDICAL HISTORY: Past Medical History:  Diagnosis Date  . Anemia   . Hypothyroidism    Dr. [name]  . Lyme disease   . Multiple sclerosis ([place])   . Neuromuscular disorder ([place])    MS  . Vision abnormalities     PAST SURGICAL HISTORY: Past Surgical History:  Procedure Laterality Date  . AUGMENTATION MAMMAPLASTY Bilateral 2003   saline  . BREAST IMPLANT REMOVAL Bilateral 02/17/2019  . BREAST  SURGERY  2002  . COLONOSCOPY  2008  . DILATATION & CURRETTAGE/HYSTEROSCOPY WITH RESECTOCOPE N/A 10/09/2012   Procedure: DILATATION & CURETTAGE/HYSTEROSCOPY WITH RESECTOCOPE;  Surgeon: [name], MD;  Location: [facility];  Service: Gynecology;  Laterality: N/A;  RESECTION with [place]   . fibroid  2019   fibroidectomy  . KNEE ARTHROSCOPY  1996  . KNEE ARTHROSCOPY  1996    FAMILY HISTORY: Family History  Problem Relation Age of Onset  . Hypertension Mother   . Arthritis Mother   . Colon cancer Father   . Thyroid cancer Sister   . Dementia Brother        Early  . Heart attack Maternal Grandmother   . Hypertension Maternal Grandmother   . CAD Maternal Grandmother   . Diabetes Maternal Grandfather   . Lung cancer Maternal Grandfather   . Alzheimer's disease Paternal Grandmother   . Hypertension Paternal Grandmother   . Hyperlipidemia Paternal Grandmother   . Diabetes Paternal Grandfather   . Asthma Brother   . Thyroid cancer Brother     SOCIAL HISTORY:  Social History   Socioeconomic History  . Marital status: Married    Spouse name: Not on file  . Number of children: Not on file  . Years of education: Not on file  . Highest education level: Not on file  Occupational History  . Not on file  Tobacco Use  . Smoking status: Never Smoker  . Smokeless tobacco: Never Used  Substance and Sexual Activity  . Alcohol use: Yes    Comment: 2x month  . Drug use: No  . Sexual activity: Not on file  Other Topics Concern  . Not on file  Social History Narrative  . Not on file   Social Determinants of Health   Financial Resource Strain:   . Difficulty of Paying Living Expenses:   Food Insecurity:   . Worried About Charity fundraiser in the Last Year:   . Arboriculturist in the Last Year:   Transportation Needs:   . Film/video editor (Medical):   Marland Kitchen Lack of Transportation (Non-Medical):   Physical Activity:   . Days of Exercise per Week:   . Minutes of  Exercise per Session:   Stress:   .  Feeling of Stress :   Social Connections:   . Frequency of Communication with Friends and Family:   . Frequency of Social Gatherings with Friends and Family:   . Attends Religious Services:   . Active Member of Clubs or Organizations:   . Attends Archivist Meetings:   Marland Kitchen Marital Status:   Intimate Partner Violence:   . Fear of Current or Ex-Partner:   . Emotionally Abused:   Marland Kitchen Physically Abused:   . Sexually Abused:      PHYSICAL EXAM  Vitals:   07/28/19 0832  BP: 111/71  Pulse: 67  Weight: 143 lb 8 oz (65.1 kg)  Height: 5\' 6"  (1.676 m)    Body mass index is 23.16 kg/m.   General: The patient is well-developed and well-nourished and in no acute distress  Musculoskeletal:  Back is nontender  Neurologic Exam  Mental status: The patient is alert and oriented x 3 at the time of the examination. The patient has apparent normal recent and remote memory, with an apparently normal attention span and concentration ability.   Speech is normal.  Cranial nerves: Extraocular movements are full.  Trapezius and sternocleidomastoid strength is normal. No dysarthria is noted.  Normal facial strength.  No obvious hearing deficits are noted.  Motor:  Muscle bulk is normal.   She has mildly increased muscle tone in the legs, left greater than right.. Strength is  5 / 5 in arms and right leg.   4+ to 5 left leg.  Reduced RAM in left hand and slightly reduced intrinsic muscle strength relative the right.    Sensory: Sensory testing is intact to pinprick, soft touch and vibration sensation in arms, reduced vibration in toes.       Coordination: She has good finger-nose-finger bilaterally.  Heel-to-shin is reduced, worse on the left.  Gait and station: Station is normal.   The gait is wide with a mild left foot drop.  Tandem gait is wide.  Romberg is negative.   Reflexes: Deep tendon reflexes are brisk bilaterally. There is spread at the knees,  left greater the right. No ankle clonus.       DIAGNOSTIC DATA (LABS, IMAGING, TESTING) - I reviewed patient records, labs, notes, testing and imaging myself where available.  Lab Results  Component Value Date   WBC 3.6 03/31/2019   HGB 12.7 03/31/2019   HCT 37.4 03/31/2019   MCV 92 03/31/2019   PLT 143 (L) 03/31/2019      Component Value Date/Time   NA 144 03/31/2019 1617   K 4.1 03/31/2019 1617   CL 105 03/31/2019 1617   CO2 25 03/31/2019 1617   GLUCOSE 92 03/31/2019 1617   GLUCOSE 96 12/03/2018 1204   BUN 8 03/31/2019 1617   CREATININE 0.55 (L) 03/31/2019 1617   CALCIUM 8.7 03/31/2019 1617   PROT 6.5 03/31/2019 1617   ALBUMIN 4.2 03/31/2019 1617   AST 482 (H) 03/31/2019 1617   ALT 951 (HH) 03/31/2019 1617   ALKPHOS 251 (H) 03/31/2019 1617   BILITOT 0.3 03/31/2019 1617   GFRNONAA 104 03/31/2019 1617   GFRAA 120 03/31/2019 1617      ASSESSMENT AND PLAN    1. Multiple sclerosis (HCC)   2. Gait disorder   3. High risk medication use   4. Left leg weakness   5. Other fatigue     1.  Continue Tecfidera.  We had discussion about transition from RRMS to active SPMS. 2.   She is advised  to continue to be active and exercises as tolerated.  When out of boot, we will get physical therapy. 3.   Return in 6 months or sooner if there are new or worsening neurologic symptoms or based on the results of the MRI.    Lilienne Weins A. Felecia Shelling, MD, PhD 2/58/5277, 8:24 AM Certified in Neurology, Clinical Neurophysiology, Sleep Medicine, Pain Medicine and Neuroimaging  [facility] [address], [place], [place] [address] [phone]

## 2019-07-31 ENCOUNTER — Ambulatory Visit (INDEPENDENT_AMBULATORY_CARE_PROVIDER_SITE_OTHER): Payer: PRIVATE HEALTH INSURANCE | Admitting: Orthotics

## 2019-07-31 ENCOUNTER — Other Ambulatory Visit: Payer: Self-pay

## 2019-07-31 DIAGNOSIS — M79671 Pain in right foot: Secondary | ICD-10-CM

## 2019-07-31 DIAGNOSIS — M7742 Metatarsalgia, left foot: Secondary | ICD-10-CM | POA: Diagnosis not present

## 2019-07-31 DIAGNOSIS — G5761 Lesion of plantar nerve, right lower limb: Secondary | ICD-10-CM

## 2019-07-31 DIAGNOSIS — M7741 Metatarsalgia, right foot: Secondary | ICD-10-CM

## 2019-07-31 NOTE — Progress Notes (Signed)
Cast for f/o to address general foot pain/metaralgia.  Patient reports pain at palpation of r 2nd met head and distal to IPJ;  Plan on offload 2nd, hug arch

## 2019-08-15 ENCOUNTER — Other Ambulatory Visit: Payer: PRIVATE HEALTH INSURANCE

## 2019-08-19 ENCOUNTER — Other Ambulatory Visit: Payer: Self-pay | Admitting: Internal Medicine

## 2019-08-20 ENCOUNTER — Ambulatory Visit (INDEPENDENT_AMBULATORY_CARE_PROVIDER_SITE_OTHER): Payer: PRIVATE HEALTH INSURANCE | Admitting: Podiatry

## 2019-08-20 ENCOUNTER — Encounter: Payer: Self-pay | Admitting: Podiatry

## 2019-08-20 ENCOUNTER — Other Ambulatory Visit: Payer: Self-pay

## 2019-08-20 DIAGNOSIS — M7742 Metatarsalgia, left foot: Secondary | ICD-10-CM | POA: Diagnosis not present

## 2019-08-20 DIAGNOSIS — M7741 Metatarsalgia, right foot: Secondary | ICD-10-CM

## 2019-08-20 DIAGNOSIS — G5761 Lesion of plantar nerve, right lower limb: Secondary | ICD-10-CM

## 2019-08-20 DIAGNOSIS — M7751 Other enthesopathy of right foot: Secondary | ICD-10-CM

## 2019-08-20 NOTE — Progress Notes (Signed)
° °  HPI: 59 y.o. female presenting today for follow-up evaluation of right forefoot pain.  She has received multiple injections in the past with minimal relief.  She presents today to get her custom molded orthotics and for follow-up.  No new complaints this time  Past Medical History:  Diagnosis Date   Anemia    Hypothyroidism    Dr. [name]   Lyme disease    Multiple sclerosis ([place])    Neuromuscular disorder ([place])    MS   Vision abnormalities      Physical Exam: General: The patient is alert and oriented x3 in no acute distress.  Dermatology: Skin is warm, dry and supple bilateral lower extremities. Negative for open lesions or macerations.  Vascular: Palpable pedal pulses bilaterally. No edema or erythema noted. Capillary refill within normal limits.  Neurological: Epicritic and protective threshold grossly intact bilaterally.   Musculoskeletal Exam: Range of motion within normal limits to all pedal and ankle joints bilateral. Muscle strength 5/5 in all groups bilateral.  There is pain on palpation and range of motion to the first through fourth MTPJ of the right foot.  There is also pain to the associated intermetatarsal spaces as well.  Radiographic Exam:  Normal osseous mineralization. Joint spaces preserved. No fracture/dislocation/boney destruction.    Assessment: 1.  Right forefoot metatarsalgia-chronic   Plan of Care:  1. Patient evaluated.   2.  Today the orthotics were dispensed and fitted today.  Break-in and care instructions were provided 3.  Continue wearing good supportive new balance shoes 4.  Continue meloxicam daily 5.  Return to clinic in 4 weeks  *Patient works part-time on her feet.  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  Her kids work there as well except for one daughter who works for [employer] as an Optometrist in [place].   Edrick Kins, DPM [facility]  Dr. Edrick Kins, DPM    [address], [place] [address]                Office [phone]  Fax [phone]

## 2019-08-20 NOTE — Patient Instructions (Signed)

## 2019-08-21 ENCOUNTER — Other Ambulatory Visit: Payer: PRIVATE HEALTH INSURANCE | Admitting: Orthotics

## 2019-09-17 ENCOUNTER — Ambulatory Visit: Payer: PRIVATE HEALTH INSURANCE | Admitting: Podiatry

## 2019-10-08 ENCOUNTER — Ambulatory Visit (INDEPENDENT_AMBULATORY_CARE_PROVIDER_SITE_OTHER): Payer: PRIVATE HEALTH INSURANCE | Admitting: Podiatry

## 2019-10-08 ENCOUNTER — Other Ambulatory Visit: Payer: Self-pay

## 2019-10-08 DIAGNOSIS — M7751 Other enthesopathy of right foot: Secondary | ICD-10-CM | POA: Diagnosis not present

## 2019-10-08 DIAGNOSIS — G5761 Lesion of plantar nerve, right lower limb: Secondary | ICD-10-CM | POA: Diagnosis not present

## 2019-10-08 DIAGNOSIS — M7741 Metatarsalgia, right foot: Secondary | ICD-10-CM

## 2019-10-08 MED ORDER — MELOXICAM 15 MG PO TABS: 15.0000 mg | ORAL_TABLET | Freq: Every day | ORAL | 1 refills | Status: DC

## 2019-10-09 ENCOUNTER — Encounter: Payer: Self-pay | Admitting: Podiatry

## 2019-10-09 NOTE — Progress Notes (Signed)
° °  HPI: 59 y.o. female presenting today for follow-up evaluation of right forefoot pain.  Patient states that there is minimal improvement.  She has been wearing her custom molded orthotics with minimal relief.  She completed the prescription for the meloxicam approximately 3 weeks ago and she did notice that the pain increased at that time.  Otherwise the pain is the same and she presents for further treatment and evaluation  Past Medical History:  Diagnosis Date   Anemia    Hypothyroidism    Dr. [name]   Lyme disease    Multiple sclerosis ([place])    Neuromuscular disorder ([place])    MS   Vision abnormalities      Physical Exam: General: The patient is alert and oriented x3 in no acute distress.  Dermatology: Skin is warm, dry and supple bilateral lower extremities. Negative for open lesions or macerations.  Vascular: Palpable pedal pulses bilaterally. No edema or erythema noted. Capillary refill within normal limits.  Neurological: Epicritic and protective threshold grossly intact bilaterally.   Musculoskeletal Exam: Range of motion within normal limits to all pedal and ankle joints bilateral. Muscle strength 5/5 in all groups bilateral.  There is pain on palpation and range of motion to the first through fourth MTPJ of the right foot.  There is also pain to the associated intermetatarsal spaces as well.  Assessment: 1.  Right forefoot metatarsalgia-chronic   Plan of Care:  1. Patient evaluated.   2.  Continue wearing custom molded orthotics with new balance shoes 3.  Refill prescription for meloxicam 15 mg daily 4.  The patient has had this pain chronically for over 6 months now despite multiple conservative treatment modalities.  Today we are going to order an MRI of right forefoot without contrast prior to proceeding to any surgical intervention 5.  Return to clinic after MRI results to review the results and discuss further treatment options.  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  Her kids work there as well except for one daughter who works for [employer] as an Optometrist in [place].   Edrick Kins, DPM [facility]  Dr. Edrick Kins, DPM    [address], [place] [address]                Office [phone]  Fax [phone]

## 2019-10-30 ENCOUNTER — Ambulatory Visit
Admission: RE | Admit: 2019-10-30 | Discharge: 2019-10-30 | Disposition: A | Discharge status: Home / Self Care | Payer: PRIVATE HEALTH INSURANCE | Source: Ambulatory Visit | Attending: Podiatry | Admitting: Podiatry

## 2019-10-30 ENCOUNTER — Other Ambulatory Visit: Payer: Self-pay

## 2019-10-30 DIAGNOSIS — M7751 Other enthesopathy of right foot: Secondary | ICD-10-CM

## 2019-10-30 DIAGNOSIS — G5761 Lesion of plantar nerve, right lower limb: Secondary | ICD-10-CM

## 2019-10-30 DIAGNOSIS — M7741 Metatarsalgia, right foot: Secondary | ICD-10-CM

## 2019-11-05 ENCOUNTER — Ambulatory Visit (INDEPENDENT_AMBULATORY_CARE_PROVIDER_SITE_OTHER): Payer: PRIVATE HEALTH INSURANCE | Admitting: Podiatry

## 2019-11-05 ENCOUNTER — Other Ambulatory Visit: Payer: Self-pay

## 2019-11-05 DIAGNOSIS — G5761 Lesion of plantar nerve, right lower limb: Secondary | ICD-10-CM | POA: Diagnosis not present

## 2019-11-05 DIAGNOSIS — M7751 Other enthesopathy of right foot: Secondary | ICD-10-CM

## 2019-11-05 DIAGNOSIS — M7741 Metatarsalgia, right foot: Secondary | ICD-10-CM

## 2019-11-05 NOTE — Progress Notes (Signed)
   HPI: 59 y.o. female presenting today for follow-up evaluation of right forefoot pain.  Patient had an MRI performed and she presents for follow-up evaluation and to review MRI results.  No new complaints at this time.  Past Medical History:  Diagnosis Date  . Anemia   . Hypothyroidism    Dr. [name]  . Lyme disease   . Multiple sclerosis ([place])   . Neuromuscular disorder ([place])    MS  . Vision abnormalities      Physical Exam: General: The patient is alert and oriented x3 in no acute distress.  Dermatology: Skin is warm, dry and supple bilateral lower extremities. Negative for open lesions or macerations.  Vascular: Palpable pedal pulses bilaterally. No edema or erythema noted. Capillary refill within normal limits.  Neurological: Epicritic and protective threshold grossly intact bilaterally.   Musculoskeletal Exam: Range of motion within normal limits to all pedal and ankle joints bilateral. Muscle strength 5/5 in all groups bilateral.  The most tender pain with palpation and range of motion is to the second third MTPJ's of the foot.  There is also pain to the associated intermetatarsal spaces as well mostly between the second and third toe.  MRI impression 10/30/2019: No Morton's neuroma is identified.  Small volume of fluid in the first and third intermetatarsal spaces compatible with bursitis.  Soft tissue edema centered about the dorsal aspect of the distal third metatarsal is nonspecific and could be due to dependent change, contusion or inflammation.  Assessment: 1.  Right forefoot metatarsalgia-chronic, second and third MTPJ's 2.  Morton's neuroma second interspace right   Plan of Care:  1. Patient evaluated.   2. Today we discussed the conservative versus surgical management of the presenting pathology. The patient opts for surgical management. All possible complications and details of the procedure were explained. All patient questions were answered. No  guarantees were expressed or implied.  Although the MRI was negative for findings of a Morton's neuroma the patient continues to have pain to the intermetatarsal space between the second and third toe.  She also experiences sensations that the sock is bunched up within her shoes.  Clinical findings suggest a Morton's neuroma as well as capsulitis/bursitis to the second and third MTPJ's 3. Authorization for surgery was initiated today. Surgery will consist of Morton's neurectomy second interspace right.  Weil decompression osteotomy second and third metatarsal right.  All performed within a single incision.  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  Her kids work there as well except for one daughter who works for [employer] as an Optometrist in .   Edrick Kins, DPM [facility]  Dr. Edrick Kins, DPM    [address], [place] [address]                Office [phone]  Fax [phone]

## 2019-12-15 ENCOUNTER — Telehealth: Payer: Self-pay | Admitting: Podiatry

## 2019-12-15 NOTE — Telephone Encounter (Addendum)
DOS: 01/08/2020  Procedures: Metatarsal Osteotomy 2nd & 3rd Rt (14159) & Neurectomy Rt (73312)  Medcost Effective From: 09/10/2018 -   Deductible: $3,000 with $624.88 met and $2,375.12 remaining. Out of Pocket: $15,000 with $806.98 met and $14,193.02 remaining. CoInsurance: 20% Copay:  Prior Authorization is Required. Information faxed on 12/15/2019.  PreCert# [id number] Valid From 01/08/2020 - 03/08/2020. Request# [id number]

## 2019-12-16 ENCOUNTER — Other Ambulatory Visit: Payer: Self-pay | Admitting: Podiatry

## 2019-12-17 NOTE — Telephone Encounter (Signed)
Please advise 

## 2019-12-22 ENCOUNTER — Telehealth: Payer: Self-pay

## 2019-12-22 NOTE — Telephone Encounter (Signed)
DOS 01/08/2020  METATARSAL OSTEOTOMY 2,3 RT - 28308 NEURECTOMY RT - 28080  MEDCOST EFFECTIVE DATE - 09/10/2018  PLAN DEDUCTIBLE - $6000.00 W/ $624.88 MET COINSURANCE - 60%  RECEIVED FAX FROM PAYER [place], [place] # [phone] FOR CPT 28080, [phone] GOOD FOR 01/08/2020 - 03/08/2020

## 2019-12-24 ENCOUNTER — Encounter: Payer: PRIVATE HEALTH INSURANCE | Admitting: Podiatry

## 2019-12-26 ENCOUNTER — Telehealth: Payer: Self-pay

## 2019-12-26 NOTE — Telephone Encounter (Signed)
Patient left a message on the nurse line on 12/25/19, advising that she is also having issues with the big toe on her right foot. She wants to know if Dr Amalia Hailey can fix it or look at it during surgery schedule for 01/08/20. Please call back to discuss. Thanks

## 2020-01-05 ENCOUNTER — Encounter: Payer: PRIVATE HEALTH INSURANCE | Admitting: Podiatry

## 2020-01-08 ENCOUNTER — Other Ambulatory Visit: Payer: Self-pay | Admitting: Podiatry

## 2020-01-08 DIAGNOSIS — G5761 Lesion of plantar nerve, right lower limb: Secondary | ICD-10-CM

## 2020-01-08 DIAGNOSIS — M21541 Acquired clubfoot, right foot: Secondary | ICD-10-CM

## 2020-01-08 DIAGNOSIS — Q6689 Other  specified congenital deformities of feet: Secondary | ICD-10-CM

## 2020-01-08 MED ORDER — IBUPROFEN 800 MG PO TABS: 800.0000 mg | ORAL_TABLET | Freq: Three times a day (TID) | ORAL | 1 refills | Status: DC

## 2020-01-08 MED ORDER — OXYCODONE-ACETAMINOPHEN 5-325 MG PO TABS: 1.0000 | ORAL_TABLET | ORAL | 0 refills | Status: DC | PRN

## 2020-01-08 NOTE — Progress Notes (Signed)
PRN postop 

## 2020-01-14 ENCOUNTER — Ambulatory Visit (INDEPENDENT_AMBULATORY_CARE_PROVIDER_SITE_OTHER): Payer: PRIVATE HEALTH INSURANCE

## 2020-01-14 ENCOUNTER — Other Ambulatory Visit: Payer: Self-pay

## 2020-01-14 ENCOUNTER — Ambulatory Visit (INDEPENDENT_AMBULATORY_CARE_PROVIDER_SITE_OTHER): Payer: PRIVATE HEALTH INSURANCE | Admitting: Podiatry

## 2020-01-14 DIAGNOSIS — G5761 Lesion of plantar nerve, right lower limb: Secondary | ICD-10-CM

## 2020-01-14 DIAGNOSIS — Z9889 Other specified postprocedural states: Secondary | ICD-10-CM

## 2020-01-14 MED ORDER — DOXYCYCLINE HYCLATE 100 MG PO TABS: 100.0000 mg | ORAL_TABLET | Freq: Two times a day (BID) | ORAL | 0 refills | Status: DC

## 2020-01-14 NOTE — Progress Notes (Signed)
   Subjective:  Patient presents today status post Morton's neurectomy and Weil osteotomy of the second and third metatarsal right foot. DOS: 01/08/2020.  Patient states that she is doing very well.  She has been minimally weightbearing in the cam boot at all times.  No new complaints at this time  Past Medical History:  Diagnosis Date  . Anemia   . Hypothyroidism    Dr. Leslie Dales  . Lyme disease   . Multiple sclerosis (HCC)   . Neuromuscular disorder (HCC)    MS  . Vision abnormalities       Objective/Physical Exam Neurovascular status intact.  Skin incisions appear to be well coapted with sutures and staples intact. No sign of infectious process noted. No dehiscence. No active bleeding noted. Moderate edema noted to the surgical extremity.  There are some very mild erythema just around the incision site  Radiographic Exam:  Orthopedic hardware and osteotomies sites appear to be stable with routine healing.  Assessment: 1. s/p Morton's neurectomy and Weil osteotomy second third metatarsal right foot. DOS: 01/08/2020   Plan of Care:  1. Patient was evaluated. X-rays reviewed 2.  Dressings changed.  Keep clean dry and intact x1 week 3.  Continue minimal weightbearing in the cam boot 4.  Prescription for doxycycline 100 mg 2 times daily #20 5.  Return to clinic in 1 week  *Husband's name is Nadine Counts.  Family runs and owns a Financial planner Research scientist (physical sciences), service).  Her kids work there as well except for one daughter who works for delayed and Teaching laboratory technician as an Airline pilot in [place], [place] [facility]  Dr. Felecia Shelling, DPM    [address] [place], [place] [address]                Office [phone]  Fax [phone]

## 2020-01-19 ENCOUNTER — Encounter: Payer: PRIVATE HEALTH INSURANCE | Admitting: Podiatry

## 2020-01-21 ENCOUNTER — Other Ambulatory Visit: Payer: Self-pay

## 2020-01-21 ENCOUNTER — Ambulatory Visit (INDEPENDENT_AMBULATORY_CARE_PROVIDER_SITE_OTHER): Payer: PRIVATE HEALTH INSURANCE | Admitting: Podiatry

## 2020-01-21 DIAGNOSIS — G5761 Lesion of plantar nerve, right lower limb: Secondary | ICD-10-CM | POA: Diagnosis not present

## 2020-01-21 DIAGNOSIS — M7751 Other enthesopathy of right foot: Secondary | ICD-10-CM

## 2020-01-21 DIAGNOSIS — Z9889 Other specified postprocedural states: Secondary | ICD-10-CM

## 2020-01-21 NOTE — Progress Notes (Signed)
   Subjective:  Patient presents today status post Morton's neurectomy and Weil osteotomy of the second and third metatarsal right foot. DOS: 01/08/2020.  Patient doing well.  She is weightbearing in the cam boot at all times with the assistance of walking sticks.  No new complaints at this time.  Past Medical History:  Diagnosis Date  . Anemia   . Hypothyroidism    Dr. [name]  . Lyme disease   . Multiple sclerosis ([place])   . Neuromuscular disorder ([place])    MS  . Vision abnormalities       Objective/Physical Exam Neurovascular status intact.  Skin incisions appear to be well coapted with sutures intact. No sign of infectious process noted. No dehiscence. No active bleeding noted. Moderate edema noted to the surgical extremity.  There are some very mild erythema just around the incision site  Assessment: 1. s/p Morton's neurectomy and Weil osteotomy second third metatarsal right foot. DOS: 01/08/2020   Plan of Care:  1. Patient was evaluated.  Sutures removed today 2.  Dressings changed.  Patient may begin washing and showering and getting the foot wet 3.  Continue minimal weightbearing in the cam boot.  Postsurgical shoe also dispensed today.  Patient may use this postsurgical shoe for short trips to the bathroom in the house 4.  Finish oral doxycycline 100 mg as prescribed 5.  Return to clinic in 2 weeks for follow-up x-ray  *Husband's name is Mikki Santee.  Family runs and owns a Public librarian Glass blower/designer, service).  Her kids work there as well except for one daughter who works for delayed and Physicist, medical as an Optometrist in [place], [place] [facility]  Dr. Edrick Kins, DPM    [address], [place] [address]                Office [phone]  Fax [phone]

## 2020-01-24 ENCOUNTER — Other Ambulatory Visit: Payer: Self-pay | Admitting: Podiatry

## 2020-01-24 NOTE — Telephone Encounter (Signed)
Please advise 

## 2020-01-28 ENCOUNTER — Other Ambulatory Visit: Payer: Self-pay

## 2020-01-28 ENCOUNTER — Ambulatory Visit (INDEPENDENT_AMBULATORY_CARE_PROVIDER_SITE_OTHER): Payer: PRIVATE HEALTH INSURANCE | Admitting: Neurology

## 2020-01-28 ENCOUNTER — Encounter: Payer: Self-pay | Admitting: Neurology

## 2020-01-28 VITALS — BP 122/76 | HR 64 | Ht 66.0 in | Wt 150.5 lb

## 2020-01-28 DIAGNOSIS — G5761 Lesion of plantar nerve, right lower limb: Secondary | ICD-10-CM

## 2020-01-28 DIAGNOSIS — R269 Unspecified abnormalities of gait and mobility: Secondary | ICD-10-CM

## 2020-01-28 DIAGNOSIS — G35 Multiple sclerosis: Secondary | ICD-10-CM

## 2020-01-28 DIAGNOSIS — Z79899 Other long term (current) drug therapy: Secondary | ICD-10-CM | POA: Diagnosis not present

## 2020-01-28 DIAGNOSIS — R5383 Other fatigue: Secondary | ICD-10-CM

## 2020-01-28 DIAGNOSIS — R29898 Other symptoms and signs involving the musculoskeletal system: Secondary | ICD-10-CM

## 2020-01-28 NOTE — Progress Notes (Signed)
[facility]  PATIENT: Samantha Church DOB: [birth year]  REFERRING DOCTOR OR PCP:  Donald Prose SOURCE: Patient and records from [facility] neurology.  _________________________________   HISTORICAL  CHIEF COMPLAINT:  Chief Complaint  Patient presents with  . Follow-up    RM 12, alone. Last seen 07/28/19. On Tecfidera for MS.  Ambulates with two canes. No new MS sx. Had right foot surgery 2 wks ago. Had breast implants removed 02/2019. Has ongoing dizziness and balance problems. Has had two falls since last visit. One, she was on unsteady ground. Could not lift left leg high enough around mound and fell. Second, right foot numb after foot surgery and she fell. Denies hitting head head or breaking anything. Still has bruise on left forearm.  Bernerd Pho Vaccine     Received Pfizer 1st dose 03/23/19, 2nd dose: 04/14/19, booster: 11/10/19    HISTORY OF PRESENT ILLNESS:  Samantha Church is a 60 year old woman with relapsing remitting multiple sclerosis diagnosed in 2007.    Update 01/28/2020 She is on Tecfidera (DMF) as her DMT.   She has tolerated it well.      She is walking worse, mostly due to the boot/right foot issues and left foot weakness.   Balance is mildly off.    She has had 2 falls, when her left foot caughte something and she stimbled down.  The  left leg has weakness, spasticity and reduced coordination.   Her left hand is slightly weak but coordinated.    Bladder function is the same with urgency but no incontinence.  Vision is doing well.     She has a lot of fatigue and has sleepiness.   She sleeps 2 hours most afternoons.     She sleeps ok some nights but other nights sleep seems less restorative and she is up some.  She sweats a lot at night.   Mood is doing about the same.    She had recent right foot surgery.    She had the joint revised and a Morton Neuroma excised.  .She is wearing a boot.    MS History:    She was diagnosed in 2007 after presenting with  right sided numbness and mild clumsiness / allodynia.    MRI was consistent with MS and she was started with Betaseron.  She then switched to Copaxone but had exacerbations and switched to Tysabri.     She converted to Tysabri but became JCV Ab positive and switched to Gilenya.   Due to exacerbations on Gilenya, she switched back to Tysabri x one year and then switched to Tecfidera (in 2014).   She switched to generic DMF in 2020.     IMAGING MRI brain 04/08/2019 shows multiple T2/FLAIR hyperintense foci in the hemispheres, brainstem and cerebellum in a pattern configuration consistent with chronic demyelinating plaque associated with multiple sclerosis.  None of the foci appears to be acute and they do not enhance.  Compared to the MRI dated 12/24/2015, there are no new lesions.  MRI cervical spine 04/08/2019 shows T2 hyperintense foci within the spinal cord anteriorly to the right adjacent to C2, anterolaterally to the right adjacent to C3, laterally to the left adjacent to C3-C4 to C4, laterally to the left at C5, posterolaterally to the right at C6, and to the left at T1..   When compared to the MRI from 04/26/2016, there are no new lesions.  None of the foci enhance.  Mild degenerative disc changes and spondylosis at  C3-C4, C4-C5 and C5-C6.  There is no nerve root compression or spinal stenosis.  This appears fairly stable compared to the previous MRI.  MRI thoracic spine 04/08/2019 shows foci within the spinal cord at T8-T9 and T12 that were also present on the 06/02/2015 MRI.  These are consistent with demyelinating plaque associated with multiple sclerosis.    Mild degenerative changes at T7-T8 and T10-T11 that do not lead to nerve root compression   REVIEW OF SYSTEMS: Constitutional: No fevers, chills, sweats, or change in appetite.   She has fatigue Eyes: No visual changes, double vision, eye pain Ear, nose and throat: No hearing loss, ear pain, nasal congestion, sore throat Cardiovascular: No  chest pain, palpitations Respiratory: No shortness of breath at rest or with exertion.   No wheezes GastrointestinaI: No nausea, vomiting, diarrhea, abdominal pain, fecal incontinence Genitourinary: No dysuria, urinary retention or frequency.  No nocturia. Musculoskeletal: No neck pain, back pain Integumentary: No rash, pruritus, skin lesions Neurological: as above Psychiatric: No depression at this time.  No anxiety Endocrine: She has hypothyroidism.  No palpitations, diaphoresis, change in appetite, change in weigh or increased thirst Hematologic/Lymphatic: No anemia, purpura, petechiae. Allergic/Immunologic: No itchy/runny eyes, nasal congestion, recent allergic reactions, rashes  ALLERGIES: No Known Allergies  HOME MEDICATIONS:  Current Outpatient Medications:  .  Alpha-Lipoic Acid 600 MG TABS, , Disp: , Rfl:  .  Ascorbic Acid 500 MG/15ML SYRP, Take by mouth., Disp: , Rfl:  .  Biotin 100 MG/GM POWD, , Disp: , Rfl:  .  Cholecalciferol (VITAMIN D-3) 5000 units TABS, Take 1 tablet by mouth daily., Disp: , Rfl:  .  Dimethyl Fumarate (TECFIDERA) 240 MG CPDR, Take 1 capsule (240 mg total) by mouth 2 (two) times daily., Disp: 60 capsule, Rfl: 11 .  ibuprofen (ADVIL) 800 MG tablet, Take 1 tablet (800 mg total) by mouth 3 (three) times daily., Disp: 90 tablet, Rfl: 1 .  Magnesium (OPTIMAG 125) 125 MG CAPS, Take 2 capsules by mouth daily., Disp: , Rfl:  .  Methylcobalamin 1 MG CHEW, , Disp: , Rfl:  .  Multiple Vitamins-Minerals (MULTIVITAMIN ADULT PO), Take by mouth., Disp: , Rfl:  .  NALTREXONE HCL PO, Take 3.5 mg by mouth daily. Receives from specialty pharmacy, Disp: , Rfl:  .  NP THYROID 60 MG tablet, TAKE ONE TABLET BY MOUTH DAILY BEFORE BREAKFAST, Disp: 90 tablet, Rfl: 3 .  Nutritional Supplements (OPTICLEANSE GHI PO), Take by mouth., Disp: , Rfl:  .  Turmeric POWD, by Does not apply route., Disp: , Rfl:  No current facility-administered medications for this  visit.  Facility-Administered Medications Ordered in Other Visits:  .  gadopentetate dimeglumine (MAGNEVIST) injection 13 mL, 13 mL, Intravenous, Once PRN, Aquarius Latouche A, MD .  gadopentetate dimeglumine (MAGNEVIST) injection 13 mL, 13 mL, Intravenous, Once PRN, Falon Flinchum A, MD .  gadopentetate dimeglumine (MAGNEVIST) injection 13 mL, 13 mL, Intravenous, Once PRN, Eytan Carrigan, Nanine Means, MD  PAST MEDICAL HISTORY: Past Medical History:  Diagnosis Date  . Anemia   . Hypothyroidism    Dr. [name]  . Lyme disease   . Multiple sclerosis ([place])   . Neuromuscular disorder ([place])    MS  . Vision abnormalities     PAST SURGICAL HISTORY: Past Surgical History:  Procedure Laterality Date  . AUGMENTATION MAMMAPLASTY Bilateral 2003   saline  . BREAST IMPLANT REMOVAL Bilateral 02/17/2019  . BREAST SURGERY  2002  . COLONOSCOPY  2008  . DILATATION & CURRETTAGE/HYSTEROSCOPY WITH RESECTOCOPE N/A 10/09/2012  Procedure: DILATATION & CURETTAGE/HYSTEROSCOPY WITH RESECTOCOPE;  Surgeon: [name], MD;  Location: [facility];  Service: Gynecology;  Laterality: N/A;  RESECTION with Versapoint   . fibroid  2019   fibroidectomy  . KNEE ARTHROSCOPY  1996  . KNEE ARTHROSCOPY  1996    FAMILY HISTORY: Family History  Problem Relation Age of Onset  . Hypertension Mother   . Arthritis Mother   . Colon cancer Father   . Thyroid cancer Sister   . Dementia Brother        Early  . Heart attack Maternal Grandmother   . Hypertension Maternal Grandmother   . CAD Maternal Grandmother   . Diabetes Maternal Grandfather   . Lung cancer Maternal Grandfather   . Alzheimer's disease Paternal Grandmother   . Hypertension Paternal Grandmother   . Hyperlipidemia Paternal Grandmother   . Diabetes Paternal Grandfather   . Asthma Brother   . Thyroid cancer Brother     SOCIAL HISTORY:  Social History   Socioeconomic History  . Marital status: Married    Spouse name: Not on file  . Number of children:  Not on file  . Years of education: Not on file  . Highest education level: Not on file  Occupational History  . Not on file  Tobacco Use  . Smoking status: Never Smoker  . Smokeless tobacco: Never Used  Substance and Sexual Activity  . Alcohol use: Yes    Comment: 2x month  . Drug use: No  . Sexual activity: Not on file  Other Topics Concern  . Not on file  Social History Narrative  . Not on file   Social Determinants of Health   Financial Resource Strain: Not on file  Food Insecurity: Not on file  Transportation Needs: Not on file  Physical Activity: Not on file  Stress: Not on file  Social Connections: Not on file  Intimate Partner Violence: Not on file     PHYSICAL EXAM  Vitals:   01/28/20 0801  BP: 122/76  Pulse: 64  Weight: 150 lb 8 oz (68.3 kg)  Height: 5\' 6"  (1.676 m)    Body mass index is 24.29 kg/m.   General: The patient is well-developed and well-nourished and in no acute distress  Musculoskeletal:  Back is nontender  Neurologic Exam  Mental status: The patient is alert and oriented x 3 at the time of the examination. The patient has apparent normal recent and remote memory, with an apparently normal attention span and concentration ability.   Speech is normal.  Cranial nerves: Extraocular movements are full.  Trapezius and sternocleidomastoid strength is normal. No dysarthria is noted.  Normal facial strength.  No obvious hearing deficits are noted.  Motor:  Muscle bulk is normal.   She has mildly increased muscle tone in the legs, left greater than right.. Strength is  5 / 5 in arms and right leg.   4+ to 5 left leg.  Reduced RAM in left hand and slightly reduced intrinsic muscle strength relative the right.    Sensory: Sensory testing is intact to pinprick, soft touch and vibration sensation in arms, reduced vibration in toes.       Coordination: She has good finger-nose-finger bilaterally.  Heel-to-shin is reduced, worse on the left.  Gait  and station: Station is normal.  Gait is difficult to assess due to needing a boot on the right foot.  She also appeared to have a mild left foot drop.  She cannot do a tandem gait.  Romberg was borderline.    Reflexes: Deep tendon reflexes are brisk bilaterally in the legs. There is spread at the knees, left greater the right. No ankle clonus.       DIAGNOSTIC DATA (LABS, IMAGING, TESTING) - I reviewed patient records, labs, notes, testing and imaging myself where available.  Lab Results  Component Value Date   WBC 3.6 03/31/2019   HGB 12.7 03/31/2019   HCT 37.4 03/31/2019   MCV 92 03/31/2019   PLT 143 (L) 03/31/2019      Component Value Date/Time   NA 144 03/31/2019 1617   K 4.1 03/31/2019 1617   CL 105 03/31/2019 1617   CO2 25 03/31/2019 1617   GLUCOSE 92 03/31/2019 1617   GLUCOSE 96 12/03/2018 1204   BUN 8 03/31/2019 1617   CREATININE 0.55 (L) 03/31/2019 1617   CALCIUM 8.7 03/31/2019 1617   PROT 6.5 03/31/2019 1617   ALBUMIN 4.2 03/31/2019 1617   AST 482 (H) 03/31/2019 1617   ALT 951 (HH) 03/31/2019 1617   ALKPHOS 251 (H) 03/31/2019 1617   BILITOT 0.3 03/31/2019 1617   GFRNONAA 104 03/31/2019 1617   GFRAA 120 03/31/2019 1617      ASSESSMENT AND PLAN    1. MULTIPLE SCLEROSIS   2. High risk medication use   3. Left leg weakness   4. Gait disorder   5. Other fatigue   6. Morton neuroma, right     1.  Continue Tecfidera/DMF.  Her recent MRIs showed no new lesions over a 3-year stretch.  He can wait until 2023 to check her next set of MRIs.  I will check some blood work today to make sure that her lymphocyte count is fine 2.   She is advised to continue to be active and exercises as tolerated.  When out of boot, we can do physical therapy if she would like. 3.   Return in 6 months or sooner if there are new or worsening neurologic symptoms or based on the results of the MRI.    Avana Kreiser A. Felecia Shelling, MD, PhD 5/40/9811, 9:14 AM Certified in Neurology, Clinical  Neurophysiology, Sleep Medicine, Pain Medicine and Neuroimaging  [facility] [address], [place], [place] [address] [phone]

## 2020-01-29 LAB — CBC WITH DIFFERENTIAL/PLATELET
Basophils Absolute: 0 10*3/uL (ref 0.0–0.2)
Basos: 1 %
EOS (ABSOLUTE): 0 10*3/uL (ref 0.0–0.4)
Eos: 1 %
Hematocrit: 39.8 % (ref 34.0–46.6)
Hemoglobin: 13.2 g/dL (ref 11.1–15.9)
Immature Grans (Abs): 0 10*3/uL (ref 0.0–0.1)
Immature Granulocytes: 0 %
Lymphocytes Absolute: 1.3 10*3/uL (ref 0.7–3.1)
Lymphs: 26 %
MCH: 30.9 pg (ref 26.6–33.0)
MCHC: 33.2 g/dL (ref 31.5–35.7)
MCV: 93 fL (ref 79–97)
Monocytes Absolute: 0.4 10*3/uL (ref 0.1–0.9)
Monocytes: 9 %
Neutrophils Absolute: 3.2 10*3/uL (ref 1.4–7.0)
Neutrophils: 63 %
Platelets: 302 10*3/uL (ref 150–450)
RBC: 4.27 x10E6/uL (ref 3.77–5.28)
RDW: 12.6 % (ref 11.7–15.4)
WBC: 5 10*3/uL (ref 3.4–10.8)

## 2020-01-29 LAB — HEPATIC FUNCTION PANEL
ALT: 15 IU/L (ref 0–32)
AST: 17 IU/L (ref 0–40)
Albumin: 4.8 g/dL (ref 3.8–4.9)
Alkaline Phosphatase: 54 IU/L (ref 44–121)
Bilirubin Total: <0.2 mg/dL (ref 0.0–1.2)
Bilirubin, Direct: <0.1 mg/dL (ref 0.00–0.40)
Total Protein: 7 g/dL (ref 6.0–8.5)

## 2020-02-02 ENCOUNTER — Ambulatory Visit (INDEPENDENT_AMBULATORY_CARE_PROVIDER_SITE_OTHER): Payer: PRIVATE HEALTH INSURANCE

## 2020-02-02 ENCOUNTER — Ambulatory Visit (INDEPENDENT_AMBULATORY_CARE_PROVIDER_SITE_OTHER): Payer: PRIVATE HEALTH INSURANCE | Admitting: Podiatry

## 2020-02-02 ENCOUNTER — Other Ambulatory Visit: Payer: Self-pay

## 2020-02-02 DIAGNOSIS — G5761 Lesion of plantar nerve, right lower limb: Secondary | ICD-10-CM | POA: Diagnosis not present

## 2020-02-02 DIAGNOSIS — Z9889 Other specified postprocedural states: Secondary | ICD-10-CM

## 2020-02-04 ENCOUNTER — Encounter: Payer: Self-pay | Admitting: Podiatry

## 2020-02-11 NOTE — Progress Notes (Signed)
   Subjective:  Patient presents today status post Morton's neurectomy and Weil osteotomy of the second and third metatarsal right foot. DOS: 01/08/2020.  Overall the patient is doing very well.  She has been weightbearing in the cam boot as instructed.  No new complaints at this time  Past Medical History:  Diagnosis Date  . Anemia   . Hypothyroidism    Dr. [name]  . Lyme disease   . Multiple sclerosis ([place])   . Neuromuscular disorder ([place])    MS  . Vision abnormalities       Objective/Physical Exam Neurovascular status intact.  Skin incisions appear to be well coapted and healed. No sign of infectious process noted. No dehiscence. No active bleeding noted.  Minimal edema noted to the surgical extremity.  The mild erythema around the incision site has resolved completely.  Assessment: 1. s/p Morton's neurectomy and Weil osteotomy second third metatarsal right foot. DOS: 01/08/2020   Plan of Care:  1. Patient was evaluated.   2.  Patient may transition of the cam boot into good supportive sneakers 3.  Begin range of motion exercises to the second and third toes 4.  Return to clinic in 4 weeks for final follow-up x-ray and evaluation  *Husband's name is Mikki Santee.  Family runs and owns a Public librarian Glass blower/designer, service).  Her kids work there as well except for one daughter who works for delayed and Physicist, medical as an Optometrist in [place], [place] [facility]  Dr. Edrick Kins, DPM    [address], [place] [address]                Office [phone]  Fax [phone]

## 2020-03-03 ENCOUNTER — Ambulatory Visit (INDEPENDENT_AMBULATORY_CARE_PROVIDER_SITE_OTHER): Payer: PRIVATE HEALTH INSURANCE | Admitting: Podiatry

## 2020-03-03 ENCOUNTER — Other Ambulatory Visit: Payer: Self-pay

## 2020-03-03 ENCOUNTER — Ambulatory Visit (INDEPENDENT_AMBULATORY_CARE_PROVIDER_SITE_OTHER): Payer: PRIVATE HEALTH INSURANCE

## 2020-03-03 ENCOUNTER — Ambulatory Visit: Payer: PRIVATE HEALTH INSURANCE

## 2020-03-03 DIAGNOSIS — Z9889 Other specified postprocedural states: Secondary | ICD-10-CM

## 2020-03-03 DIAGNOSIS — G5761 Lesion of plantar nerve, right lower limb: Secondary | ICD-10-CM

## 2020-03-03 NOTE — Progress Notes (Signed)
   Subjective:  Patient presents today status post Morton's neurectomy and Weil osteotomy of the second and third metatarsal right foot. DOS: 01/08/2020.  Overall the patient is doing very well.  She has been wearing good supportive tennis shoes/sneakers as instructed.  No new complaints at this time  Past Medical History:  Diagnosis Date  . Anemia   . Hypothyroidism    Dr. [name]  . Lyme disease   . Multiple sclerosis ([place])   . Neuromuscular disorder ([place])    MS  . Vision abnormalities       Objective/Physical Exam Neurovascular status intact.  Skin incisions appear to be well coapted and healed. No sign of infectious process noted. No dehiscence. No active bleeding noted.  Minimal edema noted to the surgical extremity.  There is some limited range of motion of the second and third MTPJ of the surgical foot.  Negative for any significant pain on palpation.  Assessment: 1. s/p Morton's neurectomy and Weil osteotomy second third metatarsal right foot. DOS: 01/08/2020   Plan of Care:  1. Patient was evaluated.   2.  Recommend daily range of motion and stretching exercises to break up scar tissue adhesion and improve range of motion 3.  Continue wearing good supportive shoes 4.  Patient may resume full activity no restrictions 5.  Return to clinic as needed  *Husband's name is Mikki Santee.  Family runs and owns a Public librarian Glass blower/designer, service).  Her kids work there as well except for one daughter who works for delayed and Physicist, medical as an Optometrist in [place], [place] [facility]  Dr. Edrick Kins, DPM    [address], [place] [address]                Office [phone]  Fax [phone]

## 2020-03-16 ENCOUNTER — Other Ambulatory Visit: Payer: Self-pay | Admitting: *Deleted

## 2020-03-16 DIAGNOSIS — G35 Multiple sclerosis: Secondary | ICD-10-CM

## 2020-03-16 MED ORDER — DIMETHYL FUMARATE 240 MG PO CPDR: 240.0000 mg | DELAYED_RELEASE_CAPSULE | Freq: Two times a day (BID) | ORAL | 11 refills | Status: DC

## 2020-03-16 NOTE — Progress Notes (Signed)
Faxed printed/signed rx dimethyl fumarate to [facility] at [phone]. Received fax confirmation.

## 2020-04-15 ENCOUNTER — Encounter: Payer: Self-pay | Admitting: Internal Medicine

## 2020-04-15 ENCOUNTER — Ambulatory Visit (INDEPENDENT_AMBULATORY_CARE_PROVIDER_SITE_OTHER): Payer: PRIVATE HEALTH INSURANCE | Admitting: Internal Medicine

## 2020-04-15 ENCOUNTER — Other Ambulatory Visit: Payer: Self-pay

## 2020-04-15 VITALS — BP 116/72 | HR 68 | Ht 66.0 in | Wt 149.0 lb

## 2020-04-15 DIAGNOSIS — E039 Hypothyroidism, unspecified: Secondary | ICD-10-CM

## 2020-04-15 LAB — TSH: TSH: 1.35 u[IU]/mL (ref 0.35–4.50)

## 2020-04-15 LAB — T3, FREE: T3, Free: 3.2 pg/mL (ref 2.3–4.2)

## 2020-04-15 LAB — T4, FREE: Free T4: 0.79 ng/dL (ref 0.60–1.60)

## 2020-04-15 MED ORDER — NP THYROID 60 MG PO TABS: ORAL_TABLET | ORAL | 3 refills | Status: DC

## 2020-04-15 NOTE — Patient Instructions (Addendum)
Please continue NP thyroid 60 mg daily.  Take the thyroid hormone every day, with water, at least 30 minutes before breakfast, separated by at least 4 hours from: - acid reflux medications - calcium - iron - multivitamins  Move the Multivitamins >4h after NP thyroid.  Please stop at the lab.  Please come back for a follow-up appointment in 1 year.

## 2020-04-15 NOTE — Progress Notes (Signed)
Patient ID: Samantha Church, female   DOB: 1960/09/18, 60 y.o.   MRN: [id number]   This visit occurred during the SARS-CoV-2 public health emergency.  Safety protocols were in place, including screening questions prior to the visit, additional usage of staff PPE, and extensive cleaning of exam room while observing appropriate contact time as indicated for disinfecting solutions.   HPI  Samantha Church is a 60 y.o.-year-old female, presenting for follow-up for hypothyroidism.  Last visit 1 year ago.  Interim history: Since last visit, she had a Morton neuroma and had surgery for this.  Pain resolved after the surgery. Since last visit, she started multivitamins - takes them only 1 hour after her thyroid hormones.  Reviewed history: Pt. has been dx with hypothyroidism in 2013 >> she was initially on Synthroid, then changed to NP thyroid at [facility]    She takes NP thyroid 60 mg daily: - in am - fasting - + Coffee with butter at least 30 minutes later - at least 30 min from b'fast - no Ca, Fe - + MVI - started after last visit, only 1h after the thyroid hh - no PPIs - on Biotin very high dose (100,000 mcg 1-2x daily) - for MS >> now off for 4 weeks. On vitamin C, D, Mg, Opticleanse, ALA (also for MS), NRF2 activator, Turmeric.  Reviewed her TFTs: Lab Results  Component Value Date   TSH 0.98 04/16/2019   TSH 0.76 12/03/2018   TSH 0.77 07/29/2018   TSH 0.70 05/07/2017   TSH 0.75 11/07/2016   TSH 0.88 06/27/2016   TSH 1.150 05/14/2015   TSH 1.87 11/24/2010   FREET4 5.19 (H) 04/16/2019   FREET4 1.39 12/03/2018   FREET4 1.1 07/29/2018   FREET4 0.78 05/07/2017   FREET4 0.85 11/07/2016   Lab Results  Component Value Date   T3FREE 18.2 (H) 04/16/2019   T3FREE 4.1 07/29/2018   T3FREE 4.3 (H) 05/07/2017   T3FREE 4.1 11/07/2016   Investigation for Hashimoto's thyroiditis was negative: Component     Latest Ref Rng & Units 11/07/2016  Thyroglobulin Ab     <  or = 1 IU/mL <1  Thyroperoxidase Ab SerPl-aCnc     <9 IU/mL <1   She continues to have fatigue and hot flashes.  Pt denies: - feeling nodules in neck - hoarseness - dysphagia - choking - SOB with lying down  Thyroid U.S (05/07/2007) report reviewed: Normal  No family history of hypo or hyperthyroidism but + FH of thyroid cancer in brother and sister: PTC. No h/o radiation tx to head or neck.  No seaweed or kelp. No recent contrast studies. No new herbal supplements. No recent steroids use.   She got her breast implants removed 02/2019.  She was seeing [facility] for Lyme ds.  Of note, liver tests became elevated>> stopped the ABx for Lyme ds.  LFTs normalized: Lab Results  Component Value Date   ALT 15 01/28/2020   AST 17 01/28/2020   ALKPHOS 54 01/28/2020   BILITOT <0.2 01/28/2020   ROS: Constitutional: + weight gain/no weight loss, + see HPI Eyes: no blurry vision, no xerophthalmia ENT: no sore throat, + see HPI Cardiovascular: no CP/no SOB/no palpitations/no leg swelling Respiratory: no cough/no SOB/no wheezing Gastrointestinal: no N/no V/no D/no C/no acid reflux Musculoskeletal: no muscle aches/+ joint aches  Skin: no rashes, no hair loss Neurological: no tremors/no numbness/no tingling/no dizziness  I reviewed pt's medications, allergies, PMH, social hx, family  hx, and changes were documented in the history of present illness. Otherwise, unchanged from my initial visit note.  Past Medical History:  Diagnosis Date  . Anemia   . Hypothyroidism    Dr. [name]  . Lyme disease   . Multiple sclerosis ([place])   . Neuromuscular disorder ([place])    MS  . Vision abnormalities    Past Surgical History:  Procedure Laterality Date  . AUGMENTATION MAMMAPLASTY Bilateral 2003   saline  . BREAST IMPLANT REMOVAL Bilateral 02/17/2019  . BREAST SURGERY  2002  . COLONOSCOPY  2008  . DILATATION & CURRETTAGE/HYSTEROSCOPY WITH RESECTOCOPE N/A  10/09/2012   Procedure: DILATATION & CURETTAGE/HYSTEROSCOPY WITH RESECTOCOPE;  Surgeon: [name], MD;  Location: [facility];  Service: Gynecology;  Laterality: N/A;  RESECTION with Versapoint   . fibroid  2019   fibroidectomy  . KNEE ARTHROSCOPY  1996  . KNEE ARTHROSCOPY  1996   Social History   Social History  . Marital status: Married    Spouse name: N/A  . Number of children: 4   Occupational History  . Accounting   Social History Main Topics  . Smoking status: Never Smoker  . Smokeless tobacco: Never Used  . Alcohol use Yes     Comment: wine, nightly  . Drug use: No   Current Outpatient Medications on File Prior to Visit  Medication Sig Dispense Refill  . Alpha-Lipoic Acid 600 MG TABS     . Ascorbic Acid 500 MG/15ML SYRP Take by mouth.    . Biotin 100 MG/GM POWD     . Cholecalciferol (VITAMIN D-3) 5000 units TABS Take 1 tablet by mouth daily.    . Dimethyl Fumarate (TECFIDERA) 240 MG CPDR Take 1 capsule (240 mg total) by mouth 2 (two) times daily. 60 capsule 11  . ibuprofen (ADVIL) 800 MG tablet Take 1 tablet (800 mg total) by mouth 3 (three) times daily. 90 tablet 1  . Magnesium (OPTIMAG 125) 125 MG CAPS Take 2 capsules by mouth daily.    . Methylcobalamin 1 MG CHEW     . Multiple Vitamins-Minerals (MULTIVITAMIN ADULT PO) Take by mouth.    Marland Kitchen NALTREXONE HCL PO Take 3.5 mg by mouth daily. Receives from specialty pharmacy    . NP THYROID 60 MG tablet TAKE ONE TABLET BY MOUTH DAILY BEFORE BREAKFAST 90 tablet 3  . Nutritional Supplements (OPTICLEANSE GHI PO) Take by mouth.    . Turmeric POWD by Does not apply route.     Current Facility-Administered Medications on File Prior to Visit  Medication Dose Route Frequency Provider Last Rate Last Admin  . gadopentetate dimeglumine (MAGNEVIST) injection 13 mL  13 mL Intravenous Once PRN Sater, Richard A, MD      . gadopentetate dimeglumine (MAGNEVIST) injection 13 mL  13 mL Intravenous Once PRN Sater, Richard A, MD      .  gadopentetate dimeglumine (MAGNEVIST) injection 13 mL  13 mL Intravenous Once PRN Sater, Nanine Means, MD       No Known Allergies Family History  Problem Relation Age of Onset  . Hypertension Mother   . Arthritis Mother   . Colon cancer Father   . Thyroid cancer Sister   . Dementia Brother        Early  . Heart attack Maternal Grandmother   . Hypertension Maternal Grandmother   . CAD Maternal Grandmother   . Diabetes Maternal Grandfather   . Lung cancer Maternal Grandfather   . Alzheimer's disease Paternal Grandmother   .  Hypertension Paternal Grandmother   . Hyperlipidemia Paternal Grandmother   . Diabetes Paternal Grandfather   . Asthma Brother   . Thyroid cancer Brother    Also, DM in PGF; HL, heart ds in M.  PE: BP 116/72   Pulse 68   Ht _0  (1.676 m)   Wt 149 lb (67.6 kg)   LMP 03/01/2016   SpO2 98%   BMI 24.05 kg/m  Wt Readings from Last 3 Encounters:  04/15/20 149 lb (67.6 kg)  01/28/20 150 lb 8 oz (68.3 kg)  07/28/19 143 lb 8 oz (65.1 kg)   Constitutional: normal weight, in NAD Eyes: PERRLA, EOMI, no exophthalmos ENT: moist mucous membranes, no thyromegaly, no cervical lymphadenopathy Cardiovascular: RRR, No MRG Respiratory: CTA B Gastrointestinal: abdomen soft, NT, ND, BS+ Musculoskeletal: no deformities, strength intact in all 4 Skin: moist, warm, no rashes Neurological: no tremor with outstretched hands, DTR normal in all 4  ASSESSMENT: 1. Acquired Hypothyroidism  PLAN:  1. Patient with longstanding nonautoimmune hypothyroidism, on desiccated thyroid extract. - latest thyroid labs reviewed with pt >> normal: Lab Results  Component Value Date   TSH 0.98 04/16/2019   - she continues on NP thyroid 60 mg daily - pt feels good on this dose. - we discussed about taking the thyroid hormone every day, with water, >30 minutes before breakfast, separated by >4 hours from acid reflux medications, calcium, iron, multivitamins. Pt. is not taking it correctly  now, since she started multivitamins only 1 hour after NP thyroid.  I advised her to move the MVI at least 4 hours after NP thyroid. - of note, she continues on high-dose biotin (100,000 units) for MS but she stopped biotin 4 weeks ago in preparation for the appointment - will check thyroid tests today, since she has been off biotin in preparation for the labs: TSH, free T3, and fT4; we discussed that, if the labs are abnormal, we will need to repeat them in a month and a half, otherwise, I would still recommend to have another TSH checked possibly at the next appointment with neurology in 3 months. - I will see her back in a year  Needs refills.  Component     Latest Ref Rng & Units 04/15/2020  TSH     0.35 - 4.50 uIU/mL 1.35  T4,Free(Direct)     0.60 - 1.60 ng/dL 0.79  Triiodothyronine,Free,Serum     2.3 - 4.2 pg/mL 3.2  TFTs are normal.   Philemon Kingdom, MD PhD [facility] Endocrinology

## 2020-05-20 ENCOUNTER — Encounter: Payer: Self-pay | Admitting: Family Medicine

## 2020-05-21 ENCOUNTER — Other Ambulatory Visit: Payer: Self-pay | Admitting: *Deleted

## 2020-05-21 DIAGNOSIS — G35 Multiple sclerosis: Secondary | ICD-10-CM

## 2020-05-21 DIAGNOSIS — R2689 Other abnormalities of gait and mobility: Secondary | ICD-10-CM

## 2020-05-21 DIAGNOSIS — R269 Unspecified abnormalities of gait and mobility: Secondary | ICD-10-CM

## 2020-06-10 ENCOUNTER — Ambulatory Visit: Payer: PRIVATE HEALTH INSURANCE | Attending: Neurology | Admitting: Physical Therapy

## 2020-06-10 ENCOUNTER — Encounter: Payer: Self-pay | Admitting: Physical Therapy

## 2020-06-10 ENCOUNTER — Other Ambulatory Visit: Payer: Self-pay

## 2020-06-10 DIAGNOSIS — R29818 Other symptoms and signs involving the nervous system: Secondary | ICD-10-CM | POA: Insufficient documentation

## 2020-06-10 DIAGNOSIS — R2681 Unsteadiness on feet: Secondary | ICD-10-CM | POA: Insufficient documentation

## 2020-06-10 DIAGNOSIS — M6281 Muscle weakness (generalized): Secondary | ICD-10-CM | POA: Insufficient documentation

## 2020-06-10 DIAGNOSIS — R2689 Other abnormalities of gait and mobility: Secondary | ICD-10-CM | POA: Diagnosis present

## 2020-06-10 NOTE — Therapy (Addendum)
[facility] [address] [place], [place], [address] Phone: [phone]   Fax:  [phone]  Physical Therapy Evaluation  Patient Details  Name: Samantha Church MRN: [id number] Date of Birth: 05/03/1960 Referring Provider (PT): Britt Bottom, MD   Encounter Date: 06/10/2020   PT End of Session - 06/10/20 2028    Visit Number 1    Number of Visits 17    Date for PT Re-Evaluation 08/09/20    Authorization Type Generic Commercial    PT Start Time 1018    PT Stop Time 1100    PT Time Calculation (min) 42 min    Equipment Utilized During Treatment Gait belt    Activity Tolerance Patient tolerated treatment well    Behavior During Therapy [facility] for tasks assessed/performed           Past Medical History:  Diagnosis Date  . Anemia   . Hypothyroidism    Dr. [name]  . Lyme disease   . Multiple sclerosis ([place])   . Neuromuscular disorder ([place])    MS  . Vision abnormalities     Past Surgical History:  Procedure Laterality Date  . AUGMENTATION MAMMAPLASTY Bilateral 2003   saline  . BREAST IMPLANT REMOVAL Bilateral 02/17/2019  . BREAST SURGERY  2002  . COLONOSCOPY  2008  . DILATATION & CURRETTAGE/HYSTEROSCOPY WITH RESECTOCOPE N/A 10/09/2012   Procedure: DILATATION & CURETTAGE/HYSTEROSCOPY WITH RESECTOCOPE;  Surgeon: [name], MD;  Location: [facility];  Service: Gynecology;  Laterality: N/A;  RESECTION with Versapoint   . fibroid  2019   fibroidectomy  . KNEE ARTHROSCOPY  1996  . KNEE ARTHROSCOPY  1996    There were no vitals filed for this visit.    Subjective Assessment - 06/10/20 1021    Subjective Having more and more difficulty. Legs feel like they are more stiff. "Muscles feel like they are disappearing." Left leg seems to drag. Has had 2 falls in the past years - couldn't lift up the left foot. Uses a single walking pole, but when going longer distances outside will use 2 walking poles. Balance is  feeling off. Feels like she is spinning sometimes (if it gets really hot outside, cleaning and going up and down a lot). Otherwise just feeling more unsteady.    Pertinent History relapsing MS (diagnosed 2007), hypothyroidism, knee arthroscopy, Lyme disease, status post Morton's neurectomy and Weil osteotomy of the second and third metatarsal right foot (12/2019).    Limitations Walking    How long can you walk comfortably? 10 minutes    Patient Stated Goals wants to be able to walk without any assistance, improve confidence when walking    Currently in Pain? No/denies              [facility] PT Assessment - 06/10/20 1025      Assessment   Medical Diagnosis MS    Referring Provider (PT) Sater, Nanine Means, MD    Onset Date/Surgical Date 05/21/20   date of referral   Hand Dominance Right    Prior Therapy PT at this location at end of 2019      Precautions   Precautions Fall      Balance Screen   Has the patient fallen in the past 6 months No    Has the patient had a decrease in activity level because of a fear of falling?  No    Is the patient reluctant to leave their home because of a fear of  falling?  No      Home Environment   Living Environment Private residence    Living Arrangements Spouse/significant other    Type of [place] to enter    Entrance Stairs-Number of Steps 2    Entrance Stairs-Rails None   has a big refridgerator to the L for balance   Home Layout Two level    Alternate Level Stairs-Number of Steps 12    Home Equipment Other (comment);Transport chair   2 walking poles   Additional Comments husband helps with getting in and out of the car, balance, cooking, cleaning. 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    Leisure used to like bowling, go for walks in the park      Sensation   Light Touch Appears Intact    Additional Comments reports feet have been feeling more numb      Coordination   Gross Motor Movements are Fluid and Coordinated No    Finger Nose Finger Test slower to perform with LLE      ROM / Strength   AROM / PROM / Strength Strength;AROM      AROM   Overall AROM Comments decr DF AROM LLE compared to RLE      Strength   Strength Assessment Site Hip;Knee;Ankle    Right/Left Hip Right;Left    Right Hip Flexion 4/5    Right Hip Extension 4+/5    Right Hip ABduction 5/5    Left Hip Flexion 3+/5    Left Hip Extension 4/5    Left Hip ABduction 3-/5    Right/Left Knee Right;Left    Right Knee Flexion 5/5    Right Knee Extension 5/5    Left Knee Flexion 4/5    Left Knee Extension 4+/5    Right/Left Ankle Right;Left    Right Ankle Dorsiflexion 5/5    Left Ankle Dorsiflexion 4/5      Transfers   Transfers Sit to Stand;Stand to Sit    Sit to Stand 5: Supervision;Without upper extremity assist    Five time sit to stand comments  13.47 seconds no UE support, favors more RLE    Stand to Sit 5: Supervision;Without upper extremity assist      Ambulation/Gait   Ambulation/Gait Yes    Ambulation/Gait Assistance 5: Supervision    Ambulation Distance (Feet) --   clinic distances   Assistive device Other (Comment)   single walking pole   Gait Pattern Decreased stance time - left;Decreased weight shift to left;Trendelenburg    Ambulation Surface Level;Indoor    Gait velocity 10.59 seconds = 3.09 ft/sec      Standardized Balance Assessment   Standardized Balance Assessment Dynamic Gait Index      Dynamic Gait Index   Level Surface Mild Impairment    Change in Gait Speed Mild Impairment    Gait with Horizontal Head Turns Mild Impairment    Gait with Vertical Head Turns Mild Impairment    Gait and Pivot Turn Mild Impairment    Step Over Obstacle  Moderate Impairment    Step Around Obstacles Mild Impairment    Steps Mild Impairment    Total Score 15  DGI comment: 15/24 - performed with walking stick      High Level Balance   High Level Balance Comments mCTSIB: conditions 1-3 = 30 seconds (mild postural sway with 3), condition 4= 25 seconds with mod postural sway                      Objective measurements completed on examination: See above findings.               PT Education - 06/10/20 2027    Education Details clinical findings, POC    Person(s) Educated Patient    Methods Explanation    Comprehension Verbalized understanding              PT Short Term Goals - 06/13/20 2108      PT SHORT TERM GOAL #1   Title Pt will be independent with initial HEP in order to build upon functional gains made in therapy. ALL STGS DUE 07/11/20    Time 4    Period Weeks    Status New    Target Date 07/11/20      PT SHORT TERM GOAL #2   Title Pt will undergo further assessment of 3MWT vs. 6MWT with single walking pole and LTG written as appropriate.    Time 4    Period Weeks    Status New      PT SHORT TERM GOAL #3   Title Pt will ambulate at least 300' outdoors over unlevel surfaces with single walking pole and supervision in order to demo improved community mobility.    Time 4    Period Weeks    Status New      PT SHORT TERM GOAL #4   Title Pt will improve condition 4 on mCTSIB to at least 30 seconds in order to demo improved vestibular input for balance.    Baseline 25 seconds.    Time 4    Period Weeks    Status New      PT SHORT TERM GOAL #5   Title Pt will improve DGI score to at least a 17/24 in order to demo decr fall risk.    Baseline 15/24    Time 4    Period Weeks    Status New            PT Long Term Goals - 06/13/20 2113      PT LONG TERM GOAL #1   Title Pt will be independent with final HEP in order to build upon functional gains made in therapy. ALL LTGS DUE 08/08/20     Time 8    Period Weeks    Status New    Target Date 08/08/20      PT LONG TERM GOAL #2   Title 3MWT vs 6MWT goal with single walking pole to be written as appropriate to demo improved walking endurance.    Time 8    Period Weeks    Status New      PT LONG TERM GOAL #3   Title Pt will ambulate at least 115' over indoor level surfaces with no AD and supervision in order to demo improved household mobility.    Time 8    Period Weeks    Status New      PT LONG TERM GOAL #4   Title Pt will improve DGI score to at least a 17/24 in order to demo decr fall risk.    Baseline 20/24    Time  8    Period Weeks    Status New      PT LONG TERM GOAL #5   Title Perform gait speed with no AD with LTG to be written as appropriate.    Baseline 3.09 ft/sec with single walking pole.    Time 8    Period Weeks    Status New              06/13/20 2119  Plan  Clinical Impression Statement Patient is a 60 year old female referred to Neuro OPPT for balance abnormalities/weakness of LLE.   Pt's PMH is significant for: relapsing MS (diagnosed 2007), hypothyroidism, knee arthroscopy, Lyme disease, status post Morton's neurectomy and Weil osteotomy of the second and third metatarsal right foot (12/2019). The following deficits were present during the exam: impaired balance, decr strength (notably L hip ABD, ext, flexor, gait abnormalities, decr endurance. Based on DGI (performed with single walking pole), pt is an incr risk for falls. Pt's gait speed with single walking pole is 3.09 ft/sec - pt's goal would be able to ambulate without any AD. Pt would benefit from skilled PT to address these impairments and functional limitations to maximize functional mobility independence and decr fall risk.  Personal Factors and Comorbidities Comorbidity 3+;Time since onset of injury/illness/exacerbation;Past/Current Experience  Comorbidities relapsing MS (diagnosed 2007), hypothyroidism, knee arthroscopy, Lyme disease,  status post Morton's neurectomy and Weil osteotomy of the second and third metatarsal right foot (12/2019)  Examination-Activity Limitations Stairs;Transfers;Squat;Locomotion Level  Examination-Participation Restrictions Community Activity;Cleaning  Pt will benefit from skilled therapeutic intervention in order to improve on the following deficits Abnormal gait;Decreased activity tolerance;Decreased coordination;Decreased balance;Decreased endurance;Decreased range of motion;Decreased strength;Dizziness;Impaired sensation  Stability/Clinical Decision Making Stable/Uncomplicated  Clinical Decision Making Low  Rehab Potential Good  PT Frequency 2x / week  PT Duration 8 weeks  PT Treatment/Interventions ADLs/Self Care Home Management;Aquatic Therapy;Stair training;Gait training;DME Instruction;Functional mobility training;Therapeutic activities;Therapeutic exercise;Balance training;Neuromuscular re-education;Patient/family education;Orthotic Fit/Training;Passive range of motion;Vestibular  PT Next Visit Plan perform 3MWT vs. 6MWT, initial HEP for hip strengthening (esp ABD), balance with vision removed and on compliant surfaces             Patient will benefit from skilled therapeutic intervention in order to improve the following deficits and impairments:     Visit Diagnosis: Unsteadiness on feet  Muscle weakness (generalized)  Other abnormalities of gait and mobility  Other symptoms and signs involving the nervous system     Problem List Patient Active Problem List   Diagnosis Date Noted  . Pre-operative clearance 12/03/2018  . High risk medication use 07/25/2018  . Well woman exam without gynecological exam 11/21/2017  . Left leg weakness 10/14/2015  . Lyme disease 11/03/2014  . Hypothyroidism (acquired) 11/03/2014  . Other fatigue 11/03/2014  . Gait disorder 11/03/2014  . MULTIPLE SCLEROSIS 09/01/2008    Arliss Journey, PT, DPT  06/10/2020, 8:29 PM  [facility] [address] [place], [place], [address] Phone: [phone]   Fax:  [phone]  Name: Samantha Church MRN: [id number] Date of Birth: [birth year]

## 2020-06-13 NOTE — Addendum Note (Signed)
Addended by: Arliss Journey on: 06/13/2020 09:26 PM   Modules accepted: Orders

## 2020-06-14 ENCOUNTER — Other Ambulatory Visit: Payer: Self-pay

## 2020-06-14 ENCOUNTER — Ambulatory Visit: Payer: PRIVATE HEALTH INSURANCE | Admitting: Physical Therapy

## 2020-06-14 ENCOUNTER — Encounter: Payer: Self-pay | Admitting: Physical Therapy

## 2020-06-14 DIAGNOSIS — R2689 Other abnormalities of gait and mobility: Secondary | ICD-10-CM

## 2020-06-14 DIAGNOSIS — R2681 Unsteadiness on feet: Secondary | ICD-10-CM | POA: Diagnosis not present

## 2020-06-14 DIAGNOSIS — R29818 Other symptoms and signs involving the nervous system: Secondary | ICD-10-CM

## 2020-06-14 DIAGNOSIS — M6281 Muscle weakness (generalized): Secondary | ICD-10-CM

## 2020-06-14 NOTE — Patient Instructions (Signed)
Access Code: [facility] URL: [email or web address] Date: 06/14/2020 Prepared by: Janann August  Exercises Clamshell - 2 x daily - 5 x weekly - 2 sets - 10 reps Supine Bridge with Resistance Band - 2 x daily - 5 x weekly - 1-2 sets - 10 reps Forward Step Touch - 2 x daily - 5 x weekly - 1 sets - 10 reps Romberg Stance Eyes Closed on Foam Pad - 2 x daily - 5 x weekly - 3 sets - 10 reps

## 2020-06-14 NOTE — Therapy (Signed)
[facility] [address] [place], [place], [address] Phone: [phone]   Fax:  [phone]  Physical Therapy Treatment  Patient Details  Name: Samantha Church MRN: [id number] Date of Birth: 1960/05/01 Referring Provider (PT): Britt Bottom, MD   Encounter Date: 06/14/2020   PT End of Session - 06/14/20 0946    Visit Number 2    Number of Visits 17    Date for PT Re-Evaluation 08/09/20    Authorization Type [employer] 2022, 60 VL combined (PT, OT, ST)    PT Start Time 0845    PT Stop Time 0930    PT Time Calculation (min) 45 min    Activity Tolerance Patient tolerated treatment well    Behavior During Therapy [facility] for tasks assessed/performed           Past Medical History:  Diagnosis Date  . Anemia   . Hypothyroidism    Dr. [name]  . Lyme disease   . Multiple sclerosis ([place])   . Neuromuscular disorder ([place])    MS  . Vision abnormalities     Past Surgical History:  Procedure Laterality Date  . AUGMENTATION MAMMAPLASTY Bilateral 2003   saline  . BREAST IMPLANT REMOVAL Bilateral 02/17/2019  . BREAST SURGERY  2002  . COLONOSCOPY  2008  . DILATATION & CURRETTAGE/HYSTEROSCOPY WITH RESECTOCOPE N/A 10/09/2012   Procedure: DILATATION & CURETTAGE/HYSTEROSCOPY WITH RESECTOCOPE;  Surgeon: [name], MD;  Location: [facility];  Service: Gynecology;  Laterality: N/A;  RESECTION with Versapoint   . fibroid  2019   fibroidectomy  . KNEE ARTHROSCOPY  1996  . KNEE ARTHROSCOPY  1996    There were no vitals filed for this visit.   Subjective Assessment - 06/14/20 0846    Subjective Brought in B walking poles today, was feeling a bit more tired today.    Pertinent History relapsing MS (diagnosed 2007), hypothyroidism, knee arthroscopy, Lyme disease, status post Morton's neurectomy and Weil osteotomy of the second and third metatarsal right foot (12/2019).    Limitations Walking    How long can you walk comfortably?  10 minutes    Patient Stated Goals wants to be able to walk without any assistance, improve confidence when walking    Currently in Pain? No/denies              [facility] PT Assessment - 06/14/20 0848      6 Minute Walk- Baseline   6 Minute Walk- Baseline yes    HR (bpm) 65    02 Sat (%RA) 100 %    Modified Borg Scale for Dyspnea 0- Nothing at all      6 Minute walk- Post Test   6 Minute Walk Post Test yes    HR (bpm) 55    02 Sat (%RA) 100 %    Modified Borg Scale for Dyspnea 2- Mild shortness of breath    Perceived Rate of Exertion (Borg) 11- Fairly light      6 minute walk test results    Aerobic Endurance Distance Walked 1039    Endurance additional comments rates 4-5/10 fatigue.                         [facility] Adult PT Treatment/Exercise - 06/14/20 0001      Ambulation/Gait   Ambulation/Gait Yes    Ambulation/Gait Assistance 5: Supervision    Ambulation Distance (Feet) 1039 Feet   during 6MWT   Assistive device Other (  Comment)   single walking pole   Gait Pattern Decreased stance time - left;Decreased weight shift to left;Trendelenburg;Decreased hip/knee flexion - left;Left circumduction    Ambulation Surface Level;Indoor      Exercises   Exercises Other Exercises    Other Exercises  on mat table: quadruped position 2 x 5 reps B fire hyrdrants for closed and open chair hip ABD strengthening on L side. pt needing verbal and tactile cues to keep hips level when performing with LLE. will practice again before adding to HEP for home          Access Code: [facility] URL: [email or web address] Date: 06/14/2020 Prepared by: Janann August  Initiated HEP - see Med[employer] for more details.   Exercises Clamshell - 2 x daily - 5 x weekly - 2 sets - 10 reps Supine Bridge with Resistance Band - 2 x daily - 5 x weekly - 1-2 sets - 10 reps Forward Step Touch - 2 x daily - 5 x weekly - 1 sets - 10 reps Romberg Stance Eyes Closed on Foam Pad - 2 x  daily - 5 x weekly - 3 sets - 10 reps          PT Education - 06/14/20 0945    Education Details initial HEP, results of 6MWT    Person(s) Educated Patient    Methods Explanation    Comprehension Verbalized understanding            PT Short Term Goals - 06/14/20 0941      PT SHORT TERM GOAL #1   Title Pt will be independent with initial HEP in order to build upon functional gains made in therapy. ALL STGS DUE 07/11/20    Time 4    Period Weeks    Status New    Target Date 07/11/20      PT SHORT TERM GOAL #2   Title Pt will undergo further assessment of 3MWT vs. 6MWT with single walking pole and LTG written as appropriate.    Baseline performed on 06/14/20 with 6MWT, ambulating 1039' with single walking pole    Time 4    Period Weeks    Status Achieved      PT SHORT TERM GOAL #3   Title Pt will ambulate at least 300' outdoors over unlevel surfaces with single walking pole and supervision in order to demo improved community mobility.    Time 4    Period Weeks    Status New      PT SHORT TERM GOAL #4   Title Pt will improve condition 4 on mCTSIB to at least 30 seconds in order to demo improved vestibular input for balance.    Baseline 25 seconds.    Time 4    Period Weeks    Status New      PT SHORT TERM GOAL #5   Title Pt will improve DGI score to at least a 17/24 in order to demo decr fall risk.    Baseline 15/24    Time 4    Period Weeks    Status New             PT Long Term Goals - 06/14/20 0941      PT LONG TERM GOAL #1   Title Pt will be independent with final HEP in order to build upon functional gains made in therapy. ALL LTGS DUE 08/08/20    Time 8    Period Weeks    Status New  PT LONG TERM GOAL #2   Title Pt will improve 6MWT distance by at least 100' with single walking pole and rating fatigue as a 2-3/10 or less in order to demo improved walking endurance.    Baseline 1039 with 4-5/10 fatigue rating.    Time 8    Period Weeks     Status Revised      PT LONG TERM GOAL #3   Title Pt will ambulate at least 115' over indoor level surfaces with no AD and supervision in order to demo improved household mobility.    Time 8    Period Weeks    Status New      PT LONG TERM GOAL #4   Title Pt will improve DGI score to at least a 17/24 in order to demo decr fall risk.    Baseline 20/24    Time 8    Period Weeks    Status New      PT LONG TERM GOAL #5   Title Perform gait speed with no AD with LTG to be written as appropriate.    Baseline 3.09 ft/sec with single walking pole.    Time 8    Period Weeks    Status New                 Plan - 06/14/20 0943    Clinical Impression Statement Performed 6MWT today with single walking pole with pt able to ambulate 1039' and pt rating 4-5/10 fatigue afterwards. As time went on, pt with more LLE fatigue with decr hip/knee flexion and using a more cirumducting pattern to help clear LLE. Remainder of session focused on initiating HEP for balance and proximal hip strengthening. Pt tolerated session well, will continue to progress towards LTGs.    Personal Factors and Comorbidities Comorbidity 3+;Time since onset of injury/illness/exacerbation;Past/Current Experience    Comorbidities relapsing MS (diagnosed 2007), hypothyroidism, knee arthroscopy, Lyme disease, status post Morton's neurectomy and Weil osteotomy of the second and third metatarsal right foot (12/2019)    Examination-Activity Limitations Stairs;Transfers;Squat;Locomotion Level    Examination-Participation Restrictions Community Activity;Cleaning    Stability/Clinical Decision Making Stable/Uncomplicated    Rehab Potential Good    PT Frequency 2x / week    PT Duration 8 weeks    PT Treatment/Interventions ADLs/Self Care Home Management;Aquatic Therapy;Stair training;Gait training;DME Instruction;Functional mobility training;Therapeutic activities;Therapeutic exercise;Balance training;Neuromuscular  re-education;Patient/family education;Orthotic Fit/Training;Passive range of motion;Vestibular    PT Next Visit Plan how was initial HEP? work on proximal hip strengthening (esp LLE), core strengthening,  balance with vision removed and on compliant surfaces    Consulted and Agree with Plan of Care Patient           Patient will benefit from skilled therapeutic intervention in order to improve the following deficits and impairments:  Abnormal gait,Decreased activity tolerance,Decreased coordination,Decreased balance,Decreased endurance,Decreased range of motion,Decreased strength,Dizziness,Impaired sensation  Visit Diagnosis: Muscle weakness (generalized)  Unsteadiness on feet  Other abnormalities of gait and mobility  Other symptoms and signs involving the nervous system     Problem List Patient Active Problem List   Diagnosis Date Noted  . Pre-operative clearance 12/03/2018  . High risk medication use 07/25/2018  . Well woman exam without gynecological exam 11/21/2017  . Left leg weakness 10/14/2015  . Lyme disease 11/03/2014  . Hypothyroidism (acquired) 11/03/2014  . Other fatigue 11/03/2014  . Gait disorder 11/03/2014  . MULTIPLE SCLEROSIS 09/01/2008    Arliss Journey, PT, DPT  06/14/2020, 9:47 AM    [facility] [address] [place], [place], [address] Phone: [phone]   Fax:  [phone]  Name: Samantha Church MRN: [id number] Date of Birth: [birth year]

## 2020-06-18 ENCOUNTER — Ambulatory Visit: Payer: PRIVATE HEALTH INSURANCE | Admitting: Physical Therapy

## 2020-06-18 ENCOUNTER — Encounter: Payer: Self-pay | Admitting: Physical Therapy

## 2020-06-18 ENCOUNTER — Other Ambulatory Visit: Payer: Self-pay

## 2020-06-18 DIAGNOSIS — R2681 Unsteadiness on feet: Secondary | ICD-10-CM | POA: Diagnosis not present

## 2020-06-18 DIAGNOSIS — M6281 Muscle weakness (generalized): Secondary | ICD-10-CM

## 2020-06-18 DIAGNOSIS — R2689 Other abnormalities of gait and mobility: Secondary | ICD-10-CM

## 2020-06-18 DIAGNOSIS — R29818 Other symptoms and signs involving the nervous system: Secondary | ICD-10-CM

## 2020-06-18 NOTE — Therapy (Signed)
[facility] [address] [place], [place], [address] Phone: [phone]   Fax:  [phone]  Physical Therapy Treatment  Patient Details  Name: Samantha Church MRN: [id number] Date of Birth: 05-May-1960 Referring Provider (PT): Britt Bottom, MD   Encounter Date: 06/18/2020   PT End of Session - 06/18/20 0942     Visit Number 3    Number of Visits 17    Date for PT Re-Evaluation 08/09/20    Authorization Type [employer] 2022, 60 VL combined (PT, OT, ST)    PT Start Time 0845    PT Stop Time 0927    PT Time Calculation (min) 42 min    Equipment Utilized During Treatment Gait belt    Activity Tolerance Patient tolerated treatment well    Behavior During Therapy [facility] for tasks assessed/performed             Past Medical History:  Diagnosis Date   Anemia    Hypothyroidism    Dr. [name]   Lyme disease    Multiple sclerosis ([place])    Neuromuscular disorder ([place])    MS   Vision abnormalities     Past Surgical History:  Procedure Laterality Date   AUGMENTATION MAMMAPLASTY Bilateral 2003   saline   BREAST IMPLANT REMOVAL Bilateral 02/17/2019   BREAST SURGERY  2002   COLONOSCOPY  2008   DILATATION & CURRETTAGE/HYSTEROSCOPY WITH RESECTOCOPE N/A 10/09/2012   Procedure: DILATATION & CURETTAGE/HYSTEROSCOPY WITH RESECTOCOPE;  Surgeon: [name], MD;  Location: [facility];  Service: Gynecology;  Laterality: N/A;  RESECTION with Versapoint    fibroid  2019   fibroidectomy   KNEE ARTHROSCOPY  1996   KNEE ARTHROSCOPY  1996    There were no vitals filed for this visit.   Subjective Assessment - 06/18/20 0847     Subjective No changes since she was last here. exercises are going well.    Pertinent History relapsing MS (diagnosed 2007), hypothyroidism, knee arthroscopy, Lyme disease, status post Morton's neurectomy and Weil osteotomy of the second and third metatarsal right foot (12/2019).    Limitations Walking     How long can you walk comfortably? 10 minutes    Patient Stated Goals wants to be able to walk without any assistance, improve confidence when walking    Currently in Pain? No/denies                               [facility] Adult PT Treatment/Exercise - 06/18/20 0850       Exercises   Exercises Knee/Hip    Other Exercises  in tall kneeling holding onto green physioball: x10 reps tall kneel mini squats - cues for glute activation, 2 x 5 reps B hip ABD (bringing leg out to the side and then back to the middle) - cues for glute activation on stance leg and pt pressing into physioball for additional core activation      Knee/Hip Exercises: Aerobic   Stepper Scifit stepper with BUE/BLE at gear 1.5 > 1.2  for 5 minutes for strengthening, activity tolerance. on bike, pt mentioning that she enjoys this machine and that she is a member of [employer] with a small gym, will see what equipment they have there (thinks it is a recumbent bike), disucssed that it would be a good means of continuing aerobic exercise and strengthening, esp after D/C from PT      Knee/Hip Exercises:  Supine   Other Supine Knee/Hip Exercises with LLE digging heel into green physioball for hamstring activation and pulling it in for knee flexion x10 reps - cues for control with foot on ball      Knee/Hip Exercises: Sidelying   Hip ABduction Strengthening;Left;2 sets;5 reps    Hip ABduction Limitations with LLE straight, initial assist to get into proper position                 Balance Exercises - 06/18/20 0923       Balance Exercises: Standing   SLS with Vectors Solid surface;Limitations    SLS with Vectors Limitations tapping RLE to cone x10 reps with UE support, cues for core/glute activation when standing on LLE    Rockerboard Lateral;EO;Limitations    Rockerboard Limitations in M/L direction: weight shifting x15 reps total UE support > none, keeping board steady :2 x 5 reps head  turns, 2 x 5 reps head nods    Retro Gait 3 reps;Limitations    Retro Gait Limitations down and back without UE support - added to HEP(at the countertop), cues for glute activation and step length               PT Education - 06/18/20 0941     Education Details backwards walking addition to HEP    Person(s) Educated Patient    Methods Explanation;Demonstration;Handout    Comprehension Verbalized understanding;Returned demonstration              PT Short Term Goals - 06/14/20 0941       PT SHORT TERM GOAL #1   Title Pt will be independent with initial HEP in order to build upon functional gains made in therapy. ALL STGS DUE 07/11/20    Time 4    Period Weeks    Status New    Target Date 07/11/20      PT SHORT TERM GOAL #2   Title Pt will undergo further assessment of 3MWT vs. 6MWT with single walking pole and LTG written as appropriate.    Baseline performed on 06/14/20 with 6MWT, ambulating 1039' with single walking pole    Time 4    Period Weeks    Status Achieved      PT SHORT TERM GOAL #3   Title Pt will ambulate at least 300' outdoors over unlevel surfaces with single walking pole and supervision in order to demo improved community mobility.    Time 4    Period Weeks    Status New      PT SHORT TERM GOAL #4   Title Pt will improve condition 4 on mCTSIB to at least 30 seconds in order to demo improved vestibular input for balance.    Baseline 25 seconds.    Time 4    Period Weeks    Status New      PT SHORT TERM GOAL #5   Title Pt will improve DGI score to at least a 17/24 in order to demo decr fall risk.    Baseline 15/24    Time 4    Period Weeks    Status New               PT Long Term Goals - 06/14/20 0941       PT LONG TERM GOAL #1   Title Pt will be independent with final HEP in order to build upon functional gains made in therapy. ALL LTGS DUE 08/08/20    Time 8  Period Weeks    Status New      PT LONG TERM GOAL #2   Title Pt will  improve 6MWT distance by at least 100' with single walking pole and rating fatigue as a 2-3/10 or less in order to demo improved walking endurance.    Baseline 1039 with 4-5/10 fatigue rating.    Time 8    Period Weeks    Status Revised      PT LONG TERM GOAL #3   Title Pt will ambulate at least 115' over indoor level surfaces with no AD and supervision in order to demo improved household mobility.    Time 8    Period Weeks    Status New      PT LONG TERM GOAL #4   Title Pt will improve DGI score to at least a 17/24 in order to demo decr fall risk.    Baseline 20/24    Time 8    Period Weeks    Status New      PT LONG TERM GOAL #5   Title Perform gait speed with no AD with LTG to be written as appropriate.    Baseline 3.09 ft/sec with single walking pole.    Time 8    Period Weeks    Status New                   Plan - 06/18/20 0943     Clinical Impression Statement Today's skilled session focused on BLE strengthening (esp L hip ABD and extensors) and balance strategies working on weight shifting on compliant surfaces, SLS, and backwards walking. Pt challenged by adding head motions while maintaining balance on rockerboard. Will continue to progress towards LTGs.    Personal Factors and Comorbidities Comorbidity 3+;Time since onset of injury/illness/exacerbation;Past/Current Experience    Comorbidities relapsing MS (diagnosed 2007), hypothyroidism, knee arthroscopy, Lyme disease, status post Morton's neurectomy and Weil osteotomy of the second and third metatarsal right foot (12/2019)    Examination-Activity Limitations Stairs;Transfers;Squat;Locomotion Level    Examination-Participation Restrictions Community Activity;Cleaning    Stability/Clinical Decision Making Stable/Uncomplicated    Rehab Potential Good    PT Frequency 2x / week    PT Duration 8 weeks    PT Treatment/Interventions ADLs/Self Care Home Management;Aquatic Therapy;Stair training;Gait training;DME  Instruction;Functional mobility training;Therapeutic activities;Therapeutic exercise;Balance training;Neuromuscular re-education;Patient/family education;Orthotic Fit/Training;Passive range of motion;Vestibular    PT Next Visit Plan continue work on LLE proximal hip strengthening (hip ABD/ext/hip flexors) and L hamstring. core strengthening,  balance with vision removed, compliant surfaces, head motions    Consulted and Agree with Plan of Care Patient             Patient will benefit from skilled therapeutic intervention in order to improve the following deficits and impairments:  Abnormal gait, Decreased activity tolerance, Decreased coordination, Decreased balance, Decreased endurance, Decreased range of motion, Decreased strength, Dizziness, Impaired sensation  Visit Diagnosis: Muscle weakness (generalized)  Other abnormalities of gait and mobility  Other symptoms and signs involving the nervous system  Unsteadiness on feet     Problem List Patient Active Problem List   Diagnosis Date Noted   Pre-operative clearance 12/03/2018   High risk medication use 07/25/2018   Well woman exam without gynecological exam 11/21/2017   Left leg weakness 10/14/2015   Lyme disease 11/03/2014   Hypothyroidism (acquired) 11/03/2014   Other fatigue 11/03/2014   Gait disorder 11/03/2014   MULTIPLE SCLEROSIS 09/01/2008    Arliss Journey, PT, DPT  06/18/2020, 9:45 AM  [facility] [address] [place], [place], [address] Phone: [phone]   Fax:  [phone]  Name: Samantha Church MRN: [id number] Date of Birth: 1961-01-09

## 2020-06-21 ENCOUNTER — Ambulatory Visit: Payer: PRIVATE HEALTH INSURANCE | Admitting: Physical Therapy

## 2020-06-21 ENCOUNTER — Other Ambulatory Visit: Payer: Self-pay

## 2020-06-21 DIAGNOSIS — R29818 Other symptoms and signs involving the nervous system: Secondary | ICD-10-CM

## 2020-06-21 DIAGNOSIS — R2681 Unsteadiness on feet: Secondary | ICD-10-CM

## 2020-06-21 DIAGNOSIS — M6281 Muscle weakness (generalized): Secondary | ICD-10-CM

## 2020-06-21 DIAGNOSIS — R2689 Other abnormalities of gait and mobility: Secondary | ICD-10-CM

## 2020-06-21 NOTE — Therapy (Signed)
[facility] [address] [place], [place], [address] Phone: [phone]   Fax:  [phone]  Physical Therapy Treatment  Patient Details  Name: Samantha Church MRN: [id number] Date of Birth: 10/14/1960 Referring Provider (PT): Felecia Shelling, Nanine Means, MD   Encounter Date: 06/21/2020   PT End of Session - 06/21/20 0849     Visit Number 4    Number of Visits 17    Date for PT Re-Evaluation 08/09/20    Authorization Type [employer] 2022, 60 VL combined (PT, OT, ST)    PT Start Time 0848    PT Stop Time 0930    PT Time Calculation (min) 42 min    Activity Tolerance Patient tolerated treatment well    Behavior During Therapy [facility] for tasks assessed/performed             Past Medical History:  Diagnosis Date   Anemia    Hypothyroidism    Dr. [name]   Lyme disease    Multiple sclerosis ([place])    Neuromuscular disorder ([place])    MS   Vision abnormalities     Past Surgical History:  Procedure Laterality Date   AUGMENTATION MAMMAPLASTY Bilateral 2003   saline   BREAST IMPLANT REMOVAL Bilateral 02/17/2019   BREAST SURGERY  2002   COLONOSCOPY  2008   DILATATION & CURRETTAGE/HYSTEROSCOPY WITH RESECTOCOPE N/A 10/09/2012   Procedure: [place];  Surgeon: [name], MD;  Location: [facility];  Service: Gynecology;  Laterality: N/A;  RESECTION with Versapoint    fibroid  2019   fibroidectomy   KNEE ARTHROSCOPY  1996   KNEE ARTHROSCOPY  1996    There were no vitals filed for this visit.   Subjective Assessment - 06/21/20 0850     Subjective No changes over the weekend; when performing back kicks is having some back pain.    Pertinent History relapsing MS (diagnosed 2007), hypothyroidism, knee arthroscopy, Lyme disease, status post Morton's neurectomy and Weil osteotomy of the second and third metatarsal right foot (12/2019).    Limitations Walking    How long can you walk  comfortably? 10 minutes    Patient Stated Goals wants to be able to walk without any assistance, improve confidence when walking    Currently in Pain? No/denies                               [facility] Adult PT Treatment/Exercise - 06/21/20 0911       Exercises   Exercises Other Exercises    Other Exercises  Reviewed tall kneeling ABD from last session and added half kneeling balance to HEP; see below.  For tall kneeling hip ABD therapist provided tactile and verbal cues to decrease compensatory use of L quadratus lumborum and hip hiking.      Knee/Hip Exercises: Standing   Lateral Step Up Right;Left;1 set;Hand Hold: 1;Step Height: 6";Limitations    Lateral Step Up Limitations one UE support on rail; cues to not pull with LUE when stepping up with LLE; 12 reps each LE    Step Down Right;Left;1 set;10 reps;Hand Hold: 2;Step Height: 6";Limitations    Step Down Limitations Step down backwards with 90 deg stance hip external rotation > internal rotation to step back up.  Cues to decrease pulling with LUE            Access Code: [facility] URL: [email or web address] Date: 06/21/2020 Prepared by: Misty Stanley  Exercises Clamshell - 2 x daily - 5 x weekly - 2 sets - 10 reps Supine Bridge with Resistance Band - 2 x daily - 5 x weekly - 1-2 sets - 10 reps Forward Step Touch - 2 x daily - 5 x weekly - 1 sets - 10 reps Romberg Stance Eyes Closed on Foam Pad - 2 x daily - 5 x weekly - 3 sets - 10 reps Backward Walking with Counter Support - 2 x daily - 5 x weekly - 3 sets Tall Kneeling Posterior Pelvic Tilt - 1 x daily - 7 x weekly - 2 sets - 10 reps Half Kneeling on Foam Pad - 1 x daily - 7 x weekly - 1 sets - 3-4 reps - 10 seconds hold      Balance Exercises - 06/21/20 0926       Balance Exercises: Standing   Standing Eyes Opened Narrow base of support (BOS);Head turns;Foam/compliant surface;Other reps (comment);Limitations    Standing Eyes Opened  Limitations standing on mat facing up ramp and then facing down ramp; staggered stance, R then L foot forwards performing 10 head turns and 10 head nods without UE support but therapist providing min A for balance/safety               PT Education - 06/21/20 0945     Education Details reviewed and revised HEP    Person(s) Educated Patient    Methods Demonstration;Handout    Comprehension Verbalized understanding;Returned demonstration              PT Short Term Goals - 06/14/20 0941       PT SHORT TERM GOAL #1   Title Pt will be independent with initial HEP in order to build upon functional gains made in therapy. ALL STGS DUE 07/11/20    Time 4    Period Weeks    Status New    Target Date 07/11/20      PT SHORT TERM GOAL #2   Title Pt will undergo further assessment of 3MWT vs. 6MWT with single walking pole and LTG written as appropriate.    Baseline performed on 06/14/20 with 6MWT, ambulating 1039' with single walking pole    Time 4    Period Weeks    Status Achieved      PT SHORT TERM GOAL #3   Title Pt will ambulate at least 300' outdoors over unlevel surfaces with single walking pole and supervision in order to demo improved community mobility.    Time 4    Period Weeks    Status New      PT SHORT TERM GOAL #4   Title Pt will improve condition 4 on mCTSIB to at least 30 seconds in order to demo improved vestibular input for balance.    Baseline 25 seconds.    Time 4    Period Weeks    Status New      PT SHORT TERM GOAL #5   Title Pt will improve DGI score to at least a 17/24 in order to demo decr fall risk.    Baseline 15/24    Time 4    Period Weeks    Status New               PT Long Term Goals - 06/14/20 0941       PT LONG TERM GOAL #1   Title Pt will be independent with final HEP in order to build upon functional gains made in therapy.  ALL LTGS DUE 08/08/20    Time 8    Period Weeks    Status New      PT LONG TERM GOAL #2   Title Pt will  improve 6MWT distance by at least 100' with single walking pole and rating fatigue as a 2-3/10 or less in order to demo improved walking endurance.    Baseline 1039 with 4-5/10 fatigue rating.    Time 8    Period Weeks    Status Revised      PT LONG TERM GOAL #3   Title Pt will ambulate at least 115' over indoor level surfaces with no AD and supervision in order to demo improved household mobility.    Time 8    Period Weeks    Status New      PT LONG TERM GOAL #4   Title Pt will improve DGI score to at least a 17/24 in order to demo decr fall risk.    Baseline 20/24    Time 8    Period Weeks    Status New      PT LONG TERM GOAL #5   Title Perform gait speed with no AD with LTG to be written as appropriate.    Baseline 3.09 ft/sec with single walking pole.    Time 8    Period Weeks    Status New                   Plan - 06/21/20 8916     Clinical Impression Statement Reviewed tall kneeling exercises from last session and provided cues to decrease compensatory mm activation; added tall and half kneeling to HEP.  Continued dynamic glute strengthening and balance training on uneven/compliant surfaces.  Continues to require UE support for balance and increased reliance on UE when performing exercises with LLE.  Will continue to address and progress towards LTG.    Personal Factors and Comorbidities Comorbidity 3+;Time since onset of injury/illness/exacerbation;Past/Current Experience    Comorbidities relapsing MS (diagnosed 2007), hypothyroidism, knee arthroscopy, Lyme disease, status post Morton's neurectomy and Weil osteotomy of the second and third metatarsal right foot (12/2019)    Examination-Activity Limitations Stairs;Transfers;Squat;Locomotion Level    Examination-Participation Restrictions Community Activity;Cleaning    Stability/Clinical Decision Making Stable/Uncomplicated    Rehab Potential Good    PT Frequency 2x / week    PT Duration 8 weeks    PT  Treatment/Interventions ADLs/Self Care Home Management;Aquatic Therapy;Stair training;Gait training;DME Instruction;Functional mobility training;Therapeutic activities;Therapeutic exercise;Balance training;Neuromuscular re-education;Patient/family education;Orthotic Fit/Training;Passive range of motion;Vestibular    PT Next Visit Plan How is she doing with tall and half kneeling exercises added to HEP?  continue work on LLE proximal hip strengthening (hip ABD/ext/hip flexors) and L hamstring. core strengthening,  balance with vision removed, compliant surfaces, head motions    Consulted and Agree with Plan of Care Patient             Patient will benefit from skilled therapeutic intervention in order to improve the following deficits and impairments:  Abnormal gait, Decreased activity tolerance, Decreased coordination, Decreased balance, Decreased endurance, Decreased range of motion, Decreased strength, Dizziness, Impaired sensation  Visit Diagnosis: Muscle weakness (generalized)  Other abnormalities of gait and mobility  Other symptoms and signs involving the nervous system  Unsteadiness on feet     Problem List Patient Active Problem List   Diagnosis Date Noted   Pre-operative clearance 12/03/2018   High risk medication use 07/25/2018   Well woman exam without gynecological  exam 11/21/2017   Left leg weakness 10/14/2015   Lyme disease 11/03/2014   Hypothyroidism (acquired) 11/03/2014   Other fatigue 11/03/2014   Gait disorder 11/03/2014   MULTIPLE SCLEROSIS 09/01/2008    Rico Junker, PT, DPT 06/21/20    9:46 AM    [facility] [address] [place], [place], [address] Phone: [phone]   Fax:  [phone]  Name: Samantha Church MRN: [id number] Date of Birth: [birth year]

## 2020-06-21 NOTE — Patient Instructions (Signed)
Access Code: [facility] URL: [email or web address] Date: 06/21/2020 Prepared by: Misty Stanley  Exercises Clamshell - 2 x daily - 5 x weekly - 2 sets - 10 reps Supine Bridge with Resistance Band - 2 x daily - 5 x weekly - 1-2 sets - 10 reps Forward Step Touch - 2 x daily - 5 x weekly - 1 sets - 10 reps Romberg Stance Eyes Closed on Foam Pad - 2 x daily - 5 x weekly - 3 sets - 10 reps Backward Walking with Counter Support - 2 x daily - 5 x weekly - 3 sets Tall Kneeling Posterior Pelvic Tilt - 1 x daily - 7 x weekly - 2 sets - 10 reps Half Kneeling on Foam Pad - 1 x daily - 7 x weekly - 1 sets - 3-4 reps - 10 seconds hold

## 2020-06-22 ENCOUNTER — Encounter: Payer: Self-pay | Admitting: Family Medicine

## 2020-06-22 ENCOUNTER — Ambulatory Visit (INDEPENDENT_AMBULATORY_CARE_PROVIDER_SITE_OTHER): Payer: PRIVATE HEALTH INSURANCE | Admitting: Family Medicine

## 2020-06-22 VITALS — BP 118/78 | HR 72 | Temp 97.8°F | Ht 66.0 in | Wt 146.8 lb

## 2020-06-22 DIAGNOSIS — W57XXXA Bitten or stung by nonvenomous insect and other nonvenomous arthropods, initial encounter: Secondary | ICD-10-CM | POA: Diagnosis not present

## 2020-06-22 DIAGNOSIS — Z8619 Personal history of other infectious and parasitic diseases: Secondary | ICD-10-CM | POA: Diagnosis not present

## 2020-06-22 DIAGNOSIS — S80862A Insect bite (nonvenomous), left lower leg, initial encounter: Secondary | ICD-10-CM

## 2020-06-22 NOTE — Progress Notes (Signed)
[facility] [address] [place] [address] Dept: [phone] Fax: [phone]  Office Visit  Subjective:    Patient ID: Samantha Church, female    DOB: [birth year], 60 y.o..   MRN: [id number]  Chief Complaint  Patient presents with   Acute Visit    C/o having a tick bite since memorial day weekend and would like to get labs done for Lymes Disease due to feeling fatigue lately.     History of Present Illness:  Patient is in today having had a tick bite Memorial Day weekend. Samantha Church notes she was camping at [facility] in [place]. She found an attached tick on the left lower leg. Her husband removed the tick. She had some redness and itching develop, but now admits the rash is improving. However, since that time, she has noted increased fatigue, that she is sleeping for longer periods of time and a possible increase in night sweats and headache. Samantha Church has a history of MS and some of these symptoms are part of her MS, but are worse recently. She has a past history of Lyme serologies suggestive of having had Lyme disease in the past.  Past Medical History: Patient Active Problem List   Diagnosis Date Noted   History of Lyme disease 06/22/2020   Pre-operative clearance 12/03/2018   High risk medication use 07/25/2018   Well woman exam without gynecological exam 11/21/2017   Left leg weakness 10/14/2015   Hypothyroidism (acquired) 11/03/2014   Other fatigue 11/03/2014   Gait disorder 11/03/2014   MULTIPLE SCLEROSIS 09/01/2008   Past Surgical History:  Procedure Laterality Date   AUGMENTATION MAMMAPLASTY Bilateral 2003   saline   BREAST IMPLANT REMOVAL Bilateral 02/17/2019   BREAST SURGERY  2002   COLONOSCOPY  2008   DILATATION & CURRETTAGE/HYSTEROSCOPY WITH RESECTOCOPE N/A 10/09/2012   Procedure: DILATATION & CURETTAGE/HYSTEROSCOPY WITH RESECTOCOPE;  Surgeon: [name], MD;  Location: [facility];  Service:  Gynecology;  Laterality: N/A;  RESECTION with Versapoint    fibroid  2019   fibroidectomy   KNEE ARTHROSCOPY  1996   KNEE ARTHROSCOPY  1996   Family History  Problem Relation Age of Onset   Hypertension Mother    Arthritis Mother    Colon cancer Father    Thyroid cancer Sister    Dementia Brother        Early   Heart attack Maternal Grandmother    Hypertension Maternal Grandmother    CAD Maternal Grandmother    Diabetes Maternal Grandfather    Lung cancer Maternal Grandfather    Alzheimer's disease Paternal Grandmother    Hypertension Paternal Grandmother    Hyperlipidemia Paternal Grandmother    Diabetes Paternal Grandfather    Asthma Brother    Thyroid cancer Brother    Outpatient Medications Prior to Visit  Medication Sig Dispense Refill   Alpha-Lipoic Acid 600 MG TABS      Ascorbic Acid 500 MG/15ML SYRP Take by mouth.     Biotin 100 MG/GM POWD      Cholecalciferol (VITAMIN D-3) 5000 units TABS Take 1 tablet by mouth daily.     Dimethyl Fumarate (TECFIDERA) 240 MG CPDR Take 1 capsule (240 mg total) by mouth 2 (two) times daily. 60 capsule 11   Magnesium (OPTIMAG 125) 125 MG CAPS Take 2 capsules by mouth daily.     Methylcobalamin 1 MG CHEW      Multiple Vitamins-Minerals (MULTIVITAMIN ADULT PO) Take by mouth.  NP THYROID 60 MG tablet TAKE ONE TABLET BY MOUTH DAILY BEFORE BREAKFAST 90 tablet 3   Nutritional Supplements (OPTICLEANSE GHI PO) Take by mouth.     Turmeric POWD by Does not apply route.     ibuprofen (ADVIL) 800 MG tablet Take 1 tablet (800 mg total) by mouth 3 (three) times daily. (Patient not taking: Reported on 06/22/2020) 90 tablet 1   NALTREXONE HCL PO Take 3.5 mg by mouth daily. Receives from specialty pharmacy (Patient not taking: No sig reported)     Facility-Administered Medications Prior to Visit  Medication Dose Route Frequency Provider Last Rate Last Admin   gadopentetate dimeglumine (MAGNEVIST) injection 13 mL  13 mL Intravenous Once PRN Sater,  Nanine Means, MD       gadopentetate dimeglumine (MAGNEVIST) injection 13 mL  13 mL Intravenous Once PRN Sater, Nanine Means, MD       gadopentetate dimeglumine (MAGNEVIST) injection 13 mL  13 mL Intravenous Once PRN Sater, Nanine Means, MD       No Known Allergies    Objective:   Today's Vitals   06/22/20 1502  BP: 118/78  Pulse: 72  Temp: 97.8 F (36.6 C)  TempSrc: Temporal  SpO2: 97%  Weight: 146 lb 12.8 oz (66.6 kg)  Height: 5\' 6"  (1.676 m)   Body mass index is 23.69 kg/m.   General: Well developed, well nourished. No acute distress. Skin: There is a 1/2 cm ovate area of induration over the lateral aspect of the left lower leg, just distal to   the knee joint. There is a central scale, but no ulceration. Psych: Alert and oriented. Normal mood and affect.  Health Maintenance Due  Topic Date Due   Pneumococcal Vaccine 65-25 Years old (1 - PCV) Never done   Zoster Vaccines- Shingrix (1 of 2) Never done   Assessment & Plan:   1. Tick bite of left lower leg, initial encounter There is no sign of erythema migrans. The fatigue, headache, and night sweats could be manifestations of her MS> However, in light of her prior history with Lyme disease, I will do an IgM test to determine if she has had recent infection. If negative, we will plan to repeat this in 3 weeks.  - Lyme Disease, IgM, Early Test w/ Rflx  2. History of Lyme disease Noted, as above.   Haydee Salter, MD

## 2020-06-22 NOTE — Patient Instructions (Signed)
Lyme test today. If negative, plan to return for repeat test in 3 weeks.

## 2020-06-23 LAB — LYME DISEASE SEROLOGY W/REFLEX: Lyme Total Antibody EIA: NEGATIVE

## 2020-06-23 NOTE — Addendum Note (Signed)
Addended by: Haydee Salter on: 06/23/2020 06:11 PM   Modules accepted: Orders

## 2020-06-25 ENCOUNTER — Ambulatory Visit: Payer: PRIVATE HEALTH INSURANCE | Admitting: Physical Therapy

## 2020-06-25 ENCOUNTER — Other Ambulatory Visit: Payer: Self-pay

## 2020-06-25 ENCOUNTER — Encounter: Payer: Self-pay | Admitting: Physical Therapy

## 2020-06-25 DIAGNOSIS — R29818 Other symptoms and signs involving the nervous system: Secondary | ICD-10-CM

## 2020-06-25 DIAGNOSIS — R2689 Other abnormalities of gait and mobility: Secondary | ICD-10-CM

## 2020-06-25 DIAGNOSIS — R2681 Unsteadiness on feet: Secondary | ICD-10-CM | POA: Diagnosis not present

## 2020-06-25 DIAGNOSIS — M6281 Muscle weakness (generalized): Secondary | ICD-10-CM

## 2020-06-25 NOTE — Therapy (Signed)
[facility] [address] [place], [place], [address] Phone: [phone]   Fax:  [phone]  Physical Therapy Treatment  Patient Details  Name: Samantha Church MRN: [id number] Date of Birth: June 13, 1960 Referring Provider (PT): Britt Bottom, MD   Encounter Date: 06/25/2020   PT End of Session - 06/25/20 0926     Visit Number 5    Number of Visits 17    Date for PT Re-Evaluation 08/09/20    Authorization Type [employer] 2022, 60 VL combined (PT, OT, ST)    PT Start Time 0845    PT Stop Time 0925    PT Time Calculation (min) 40 min    Activity Tolerance Patient tolerated treatment well    Behavior During Therapy [facility] for tasks assessed/performed             Past Medical History:  Diagnosis Date   Anemia    Hypothyroidism    Dr. [name]   Lyme disease    Multiple sclerosis ([place])    Neuromuscular disorder ([place])    MS   Vision abnormalities     Past Surgical History:  Procedure Laterality Date   AUGMENTATION MAMMAPLASTY Bilateral 2003   saline   BREAST IMPLANT REMOVAL Bilateral 02/17/2019   BREAST SURGERY  2002   COLONOSCOPY  2008   DILATATION & CURRETTAGE/HYSTEROSCOPY WITH RESECTOCOPE N/A 10/09/2012   Procedure: [place];  Surgeon: [name], MD;  Location: [facility];  Service: Gynecology;  Laterality: N/A;  RESECTION with Versapoint    fibroid  2019   fibroidectomy   KNEE ARTHROSCOPY  1996   KNEE ARTHROSCOPY  1996    There were no vitals filed for this visit.   Subjective Assessment - 06/25/20 0847     Subjective Got a tic bite and went to the PCP and got tested for lyme disease, came back negative but need to get tested again in a few weeks. New exercises are going well.    Pertinent History relapsing MS (diagnosed 2007), hypothyroidism, knee arthroscopy, Lyme disease, status post Morton's neurectomy and Weil osteotomy of the second and third  metatarsal right foot (12/2019).    Limitations Walking    How long can you walk comfortably? 10 minutes    Patient Stated Goals wants to be able to walk without any assistance, improve confidence when walking    Currently in Pain? No/denies                               [facility] Adult PT Treatment/Exercise - 06/25/20 0859       Exercises   Exercises Other Exercises    Other Exercises  on red mat: tall kneeling with hip ABD x10 reps each side -initial verbal and tactile cues for proper technique esp with LLE in order to prevent compensatory movements, then eyes closed x10 reps head turns, x10 reps head nods, half kneel 2 x 10 second holds B, then x5 reps B alternating UE raises, cues for glute activation      Knee/Hip Exercises: Standing   Hip Abduction Stengthening;Both;2 sets;10 reps    Abduction Limitations initial cues for proper technique for open/closed chain strengthening   Other Standing Knee Exercises standing hip extension with BUE support: x10 reps B, verbal and tactile cues for technique                 Balance Exercises - 06/25/20 0001  Balance Exercises: Standing   Standing Eyes Closed Narrow base of support (BOS);Foam/compliant surface;3 reps;Limitations    Standing Eyes Closed Limitations on blue air ex, 3 x 15 seconds with pt with incr weight shift to R    SLS with Vectors Solid surface    SLS with Vectors Limitations tapping RLE to 6" step with UE support > none x12 reps, then leaving RLE on step with LLE as stance leg 3 x 10 seconds, verbal/tactile cues for proper technique and weight shift   Partial Tandem Stance Foam/compliant surface;Eyes open;Limitations    Partial Tandem Stance Limitations each leg posteriorly, head turns x5 reps, head nods x5 reps, needing intermittent tap to bars for balance    Sidestepping 2 reps;Limitations    Sidestepping Limitations down and back on blue foam beam, cues for foot clearance                  PT Short Term Goals - 06/14/20 0941       PT SHORT TERM GOAL #1   Title Pt will be independent with initial HEP in order to build upon functional gains made in therapy. ALL STGS DUE 07/11/20    Time 4    Period Weeks    Status New    Target Date 07/11/20      PT SHORT TERM GOAL #2   Title Pt will undergo further assessment of 3MWT vs. 6MWT with single walking pole and LTG written as appropriate.    Baseline performed on 06/14/20 with 6MWT, ambulating 1039' with single walking pole    Time 4    Period Weeks    Status Achieved      PT SHORT TERM GOAL #3   Title Pt will ambulate at least 300' outdoors over unlevel surfaces with single walking pole and supervision in order to demo improved community mobility.    Time 4    Period Weeks    Status New      PT SHORT TERM GOAL #4   Title Pt will improve condition 4 on mCTSIB to at least 30 seconds in order to demo improved vestibular input for balance.    Baseline 25 seconds.    Time 4    Period Weeks    Status New      PT SHORT TERM GOAL #5   Title Pt will improve DGI score to at least a 17/24 in order to demo decr fall risk.    Baseline 15/24    Time 4    Period Weeks    Status New               PT Long Term Goals - 06/14/20 0941       PT LONG TERM GOAL #1   Title Pt will be independent with final HEP in order to build upon functional gains made in therapy. ALL LTGS DUE 08/08/20    Time 8    Period Weeks    Status New      PT LONG TERM GOAL #2   Title Pt will improve 6MWT distance by at least 100' with single walking pole and rating fatigue as a 2-3/10 or less in order to demo improved walking endurance.    Baseline 1039 with 4-5/10 fatigue rating.    Time 8    Period Weeks    Status Revised      PT LONG TERM GOAL #3   Title Pt will ambulate at least 115' over indoor level surfaces with no  AD and supervision in order to demo improved household mobility.    Time 8    Period Weeks    Status New      PT  LONG TERM GOAL #4   Title Pt will improve DGI score to at least a 17/24 in order to demo decr fall risk.    Baseline 20/24    Time 8    Period Weeks    Status New      PT LONG TERM GOAL #5   Title Perform gait speed with no AD with LTG to be written as appropriate.    Baseline 3.09 ft/sec with single walking pole.    Time 8    Period Weeks    Status New                   Plan - 06/25/20 1212     Clinical Impression Statement Continued to work on BLE strengthening with focus on hip ABD and hip extensors in closed and open chain positions today. Performed balance strategies on unlevel surfaces and SLS tasks on level surfaces, with pt incr weight shift to LLE with incr reps. With eyes closed on foam, pt with incr lean to R. Will continue to progress towards LTGs.    Personal Factors and Comorbidities Comorbidity 3+;Time since onset of injury/illness/exacerbation;Past/Current Experience    Comorbidities relapsing MS (diagnosed 2007), hypothyroidism, knee arthroscopy, Lyme disease, status post Morton's neurectomy and Weil osteotomy of the second and third metatarsal right foot (12/2019)    Examination-Activity Limitations Stairs;Transfers;Squat;Locomotion Level    Examination-Participation Restrictions Community Activity;Cleaning    Stability/Clinical Decision Making Stable/Uncomplicated    Rehab Potential Good    PT Frequency 2x / week    PT Duration 8 weeks    PT Treatment/Interventions ADLs/Self Care Home Management;Aquatic Therapy;Stair training;Gait training;DME Instruction;Functional mobility training;Therapeutic activities;Therapeutic exercise;Balance training;Neuromuscular re-education;Patient/family education;Orthotic Fit/Training;Passive range of motion;Vestibular    PT Next Visit Plan continue work on LLE proximal hip strengthening (hip ABD/ext/hip flexors) and L hamstring. core strengthening,  balance with vision removed, compliant surfaces, head motions. weight shifting  activities to L    Consulted and Agree with Plan of Care Patient             Patient will benefit from skilled therapeutic intervention in order to improve the following deficits and impairments:  Abnormal gait, Decreased activity tolerance, Decreased coordination, Decreased balance, Decreased endurance, Decreased range of motion, Decreased strength, Dizziness, Impaired sensation  Visit Diagnosis: Muscle weakness (generalized)  Other abnormalities of gait and mobility  Other symptoms and signs involving the nervous system     Problem List Patient Active Problem List   Diagnosis Date Noted   History of Lyme disease 06/22/2020   Pre-operative clearance 12/03/2018   High risk medication use 07/25/2018   Well woman exam without gynecological exam 11/21/2017   Left leg weakness 10/14/2015   Hypothyroidism (acquired) 11/03/2014   Other fatigue 11/03/2014   Gait disorder 11/03/2014   MULTIPLE SCLEROSIS 09/01/2008    Arliss Journey, PT, DPT  06/25/2020, 12:14 PM  [facility] [address] [place], [place], [address] Phone: [phone]   Fax:  [phone]  Name: VEYDA KAUFMAN MRN: [id number] Date of Birth: 1960-03-12

## 2020-06-28 ENCOUNTER — Other Ambulatory Visit: Payer: Self-pay

## 2020-06-28 ENCOUNTER — Encounter: Payer: Self-pay | Admitting: Physical Therapy

## 2020-06-28 ENCOUNTER — Ambulatory Visit: Payer: PRIVATE HEALTH INSURANCE | Admitting: Physical Therapy

## 2020-06-28 DIAGNOSIS — R2681 Unsteadiness on feet: Secondary | ICD-10-CM | POA: Diagnosis not present

## 2020-06-28 DIAGNOSIS — R2689 Other abnormalities of gait and mobility: Secondary | ICD-10-CM

## 2020-06-28 DIAGNOSIS — R29818 Other symptoms and signs involving the nervous system: Secondary | ICD-10-CM

## 2020-06-28 DIAGNOSIS — M6281 Muscle weakness (generalized): Secondary | ICD-10-CM

## 2020-06-28 NOTE — Therapy (Signed)
[facility] [address] [place], [place], [address] Phone: [phone]   Fax:  [phone]  Physical Therapy Treatment  Patient Details  Name: Samantha Church MRN: [id number] Date of Birth: 1960/12/19 Referring Provider (PT): Felecia Shelling, Nanine Means, MD   Encounter Date: 06/28/2020   PT End of Session - 06/28/20 1012     Visit Number 6    Number of Visits 17    Date for PT Re-Evaluation 08/09/20    Authorization Type [employer] 2022, 60 VL combined (PT, OT, ST)    PT Start Time 0930    PT Stop Time 1010    PT Time Calculation (min) 40 min    Equipment Utilized During Treatment Gait belt    Activity Tolerance Patient tolerated treatment well    Behavior During Therapy [facility] for tasks assessed/performed             Past Medical History:  Diagnosis Date   Anemia    Hypothyroidism    Dr. [name]   Lyme disease    Multiple sclerosis ([place])    Neuromuscular disorder ([place])    MS   Vision abnormalities     Past Surgical History:  Procedure Laterality Date   AUGMENTATION MAMMAPLASTY Bilateral 2003   saline   BREAST IMPLANT REMOVAL Bilateral 02/17/2019   BREAST SURGERY  2002   COLONOSCOPY  2008   DILATATION & CURRETTAGE/HYSTEROSCOPY WITH RESECTOCOPE N/A 10/09/2012   Procedure: DILATATION & CURETTAGE/HYSTEROSCOPY WITH RESECTOCOPE;  Surgeon: [name], MD;  Location: [facility];  Service: Gynecology;  Laterality: N/A;  RESECTION with Versapoint    fibroid  2019   fibroidectomy   KNEE ARTHROSCOPY  1996   KNEE ARTHROSCOPY  1996    There were no vitals filed for this visit.   Subjective Assessment - 06/28/20 0934     Subjective No changes.    Pertinent History relapsing MS (diagnosed 2007), hypothyroidism, knee arthroscopy, Lyme disease, status post Morton's neurectomy and Weil osteotomy of the second and third metatarsal right foot (12/2019).    Limitations Walking    How long can you walk comfortably? 10 minutes     Patient Stated Goals wants to be able to walk without any assistance, improve confidence when walking    Currently in Pain? No/denies                               [facility] Adult PT Treatment/Exercise - 06/28/20 0935       Therapeutic Activites    Therapeutic Activities Other Therapeutic Activities    Other Therapeutic Activities pt asking about going to ENT to get her inner ear checked out (reports her friend suggested it) with pt having no reports of dizziness, discussed function of inner ear as it pertains to balance and based on condition 4 of mCTSIB performed at eval, pt lacking in this area, discussed use of balance master at next session for more thorough and in depth assessment of balance systems, pt in agreement      Knee/Hip Exercises: Aerobic   Stepper Scifit stepper with BUE/BLE at gear 1.3 for 5 minutes for strengthening, activity tolerance.                 Balance Exercises - 06/28/20 0955       Balance Exercises: Standing   Standing Eyes Closed Narrow base of support (BOS);Foam/compliant surface;3 reps;Limitations    Standing Eyes Closed Limitations on blue air  ex; wider BOS to more narrow 3 x 30 seconds, feet hip width x5 reps, x10 reps head turns, x5 reps, x10 reps head nods    Stepping Strategy Limitations;Anterior;Posterior    Stepping Strategy Limitations standing on blue foam beam, alternating feet, cues for foot clearance and weight shifting x10 reps each direction   Step Ups Forward;Lateral;Limitations;Intermittent UE support;UE support 1;6 inch    Step Ups Limitations at staircase with LLE on step with intermittent/single UE support, x10 reps forward and lateral, cues for weight shifting over LLE and for glute activation for dynamic SLS, cues to keep pelvis level    Tandem Gait Forward;Intermittent upper extremity support;3 reps;Limitations    Tandem Gait Limitations down and back 3 reps at the countertop    Step Over Hurdles / Cones  stepping over 4 obstacles ranging from 2-4" in height at the countertop, with intermittent UE support as needed with reciprocal pattern, cues for hip/knee flexion to clear LLE                 PT Short Term Goals - 06/14/20 0941       PT SHORT TERM GOAL #1   Title Pt will be independent with initial HEP in order to build upon functional gains made in therapy. ALL STGS DUE 07/11/20    Time 4    Period Weeks    Status New    Target Date 07/11/20      PT SHORT TERM GOAL #2   Title Pt will undergo further assessment of 3MWT vs. 6MWT with single walking pole and LTG written as appropriate.    Baseline performed on 06/14/20 with 6MWT, ambulating 1039' with single walking pole    Time 4    Period Weeks    Status Achieved      PT SHORT TERM GOAL #3   Title Pt will ambulate at least 300' outdoors over unlevel surfaces with single walking pole and supervision in order to demo improved community mobility.    Time 4    Period Weeks    Status New      PT SHORT TERM GOAL #4   Title Pt will improve condition 4 on mCTSIB to at least 30 seconds in order to demo improved vestibular input for balance.    Baseline 25 seconds.    Time 4    Period Weeks    Status New      PT SHORT TERM GOAL #5   Title Pt will improve DGI score to at least a 17/24 in order to demo decr fall risk.    Baseline 15/24    Time 4    Period Weeks    Status New               PT Long Term Goals - 06/14/20 0941       PT LONG TERM GOAL #1   Title Pt will be independent with final HEP in order to build upon functional gains made in therapy. ALL LTGS DUE 08/08/20    Time 8    Period Weeks    Status New      PT LONG TERM GOAL #2   Title Pt will improve 6MWT distance by at least 100' with single walking pole and rating fatigue as a 2-3/10 or less in order to demo improved walking endurance.    Baseline 1039 with 4-5/10 fatigue rating.    Time 8    Period Weeks    Status Revised  PT LONG TERM GOAL #3    Title Pt will ambulate at least 115' over indoor level surfaces with no AD and supervision in order to demo improved household mobility.    Time 8    Period Weeks    Status New      PT LONG TERM GOAL #4   Title Pt will improve DGI score to at least a 17/24 in order to demo decr fall risk.    Baseline 20/24    Time 8    Period Weeks    Status New      PT LONG TERM GOAL #5   Title Perform gait speed with no AD with LTG to be written as appropriate.    Baseline 3.09 ft/sec with single walking pole.    Time 8    Period Weeks    Status New                   Plan - 06/28/20 1016     Clinical Impression Statement Continued to focus on balance strategies on unlevel surfaces, LLE>RLE strengthening, and vestibular input for balance. Pt tolerated session well, had incr difficulty with vision removed and performing head turns vs. nods on unlevel surfaces. Needed cues for hip/knee flexion with LLE to clear obstacles. Will continue to progress towards LTGs.    Personal Factors and Comorbidities Comorbidity 3+;Time since onset of injury/illness/exacerbation;Past/Current Experience    Comorbidities relapsing MS (diagnosed 2007), hypothyroidism, knee arthroscopy, Lyme disease, status post Morton's neurectomy and Weil osteotomy of the second and third metatarsal right foot (12/2019)    Examination-Activity Limitations Stairs;Transfers;Squat;Locomotion Level    Examination-Participation Restrictions Community Activity;Cleaning    Stability/Clinical Decision Making Stable/Uncomplicated    Rehab Potential Good    PT Frequency 2x / week    PT Duration 8 weeks    PT Treatment/Interventions ADLs/Self Care Home Management;Aquatic Therapy;Stair training;Gait training;DME Instruction;Functional mobility training;Therapeutic activities;Therapeutic exercise;Balance training;Neuromuscular re-education;Patient/family education;Orthotic Fit/Training;Passive range of motion;Vestibular    PT Next Visit Plan  perform balance master. continue work on LLE proximal hip strengthening (hip ABD/ext/hip flexors) core strengthening,  balance with vision removed, compliant surfaces, head motions. weight shifting activities to L    Consulted and Agree with Plan of Care Patient             Patient will benefit from skilled therapeutic intervention in order to improve the following deficits and impairments:  Abnormal gait, Decreased activity tolerance, Decreased coordination, Decreased balance, Decreased endurance, Decreased range of motion, Decreased strength, Dizziness, Impaired sensation  Visit Diagnosis: Muscle weakness (generalized)  Other symptoms and signs involving the nervous system  Other abnormalities of gait and mobility  Unsteadiness on feet     Problem List Patient Active Problem List   Diagnosis Date Noted   History of Lyme disease 06/22/2020   Pre-operative clearance 12/03/2018   High risk medication use 07/25/2018   Well woman exam without gynecological exam 11/21/2017   Left leg weakness 10/14/2015   Hypothyroidism (acquired) 11/03/2014   Other fatigue 11/03/2014   Gait disorder 11/03/2014   MULTIPLE SCLEROSIS 09/01/2008    Arliss Journey, PT, DPT  06/28/2020, 10:18 AM  [facility] [address] [place], [place], [address] Phone: [phone]   Fax:  [phone]  Name: CANDIA KINGSBURY MRN: [id number] Date of Birth: 1960-04-03

## 2020-07-02 ENCOUNTER — Ambulatory Visit: Payer: PRIVATE HEALTH INSURANCE | Admitting: Physical Therapy

## 2020-07-02 ENCOUNTER — Encounter: Payer: Self-pay | Admitting: Physical Therapy

## 2020-07-02 ENCOUNTER — Other Ambulatory Visit: Payer: Self-pay | Admitting: *Deleted

## 2020-07-02 ENCOUNTER — Other Ambulatory Visit: Payer: Self-pay

## 2020-07-02 DIAGNOSIS — M6281 Muscle weakness (generalized): Secondary | ICD-10-CM

## 2020-07-02 DIAGNOSIS — R2689 Other abnormalities of gait and mobility: Secondary | ICD-10-CM

## 2020-07-02 DIAGNOSIS — R2681 Unsteadiness on feet: Secondary | ICD-10-CM | POA: Diagnosis not present

## 2020-07-02 DIAGNOSIS — R29818 Other symptoms and signs involving the nervous system: Secondary | ICD-10-CM

## 2020-07-02 DIAGNOSIS — G35 Multiple sclerosis: Secondary | ICD-10-CM

## 2020-07-02 MED ORDER — DIMETHYL FUMARATE 240 MG PO CPDR: 240.0000 mg | DELAYED_RELEASE_CAPSULE | Freq: Two times a day (BID) | ORAL | 3 refills | Status: DC

## 2020-07-02 NOTE — Telephone Encounter (Signed)
I called the patient. She needs generic Tecfidera sent to [facility] at [facility]. Per vo from Dr. Felecia Shelling, okay for her to transition to generic. We will send 90-day in hopes her insurance plan will cover. If not, she will have them run it for 30-days.

## 2020-07-02 NOTE — Therapy (Signed)
[facility] [address] [place], [place], [address] Phone: [phone]   Fax:  [phone]  Physical Therapy Treatment  Patient Details  Name: Samantha Church MRN: [id number] Date of Birth: [birth year] Referring Provider (PT): Britt Bottom, MD   Encounter Date: 07/02/2020   PT End of Session - 07/02/20 1217     Visit Number 7    Number of Visits 17    Date for PT Re-Evaluation 08/09/20    Authorization Type [employer] 2022, 60 VL combined (PT, OT, ST)    PT Start Time 0933    PT Stop Time 1014    PT Time Calculation (min) 41 min    Equipment Utilized During Treatment Gait belt    Activity Tolerance Patient tolerated treatment well    Behavior During Therapy [facility] for tasks assessed/performed             Past Medical History:  Diagnosis Date   Anemia    Hypothyroidism    Dr. [name]   Lyme disease    Multiple sclerosis ([place])    Neuromuscular disorder ([place])    MS   Vision abnormalities     Past Surgical History:  Procedure Laterality Date   AUGMENTATION MAMMAPLASTY Bilateral 2003   saline   BREAST IMPLANT REMOVAL Bilateral 02/17/2019   BREAST SURGERY  2002   COLONOSCOPY  2008   DILATATION & CURRETTAGE/HYSTEROSCOPY WITH RESECTOCOPE N/A 10/09/2012   Procedure: DILATATION & CURETTAGE/HYSTEROSCOPY WITH RESECTOCOPE;  Surgeon: [name], MD;  Location: [facility];  Service: Gynecology;  Laterality: N/A;  RESECTION with Versapoint    fibroid  2019   fibroidectomy   KNEE ARTHROSCOPY  1996   KNEE ARTHROSCOPY  1996    There were no vitals filed for this visit.   Subjective Assessment - 07/02/20 0938     Subjective Asking what kind of toe exercises can help her ROM after her foot surgery    Pertinent History relapsing MS (diagnosed 2007), hypothyroidism, knee arthroscopy, Lyme disease, status post Morton's neurectomy and Weil osteotomy of the second and third metatarsal right foot (12/2019).     Limitations Walking    How long can you walk comfortably? 10 minutes    Patient Stated Goals wants to be able to walk without any assistance, improve confidence when walking    Currently in Pain? No/denies                               [facility] Adult PT Treatment/Exercise - 07/02/20 1223       Exercises   Exercises Other Exercises    Other Exercises  pt asking what kind of exercises she can do for incr movement of her R 2nd/3rd toes, reports that her surgeon just told her to bend her toes and stretch them which is what she has been doing, instructed pt on doing toe curls on towel x10 reps with 3 second hold            Neuro re-ed: sensory organization test performed with following results: Conditions: 1: WNL 2: WNL for one trial, below average all 3 trials 3: below average all 3 trials   4: WNL all 3 trials  5: WNL 1st and 3rd trial, below average 2nd trial  6: fall 1st attempt, WNL last 2 trials Composite score: 70 = right at age related norms Sensory Analysis Som: ~85-90 (below norm) Vis: ~85-90 (WNL) Vest: 60 (WNL)  Pref: 80 (below norm) Strategy analysis: more hip dominant during conditions 4-6       COG alignment: right and anteriorly.   Educated patient on findings and how it relates to 3 balance systems and compared to age related/diagnosis norms. Areas to continue to work on with therapy.       Balance Exercises - 07/02/20 1012       Balance Exercises: Standing   SLS with Vectors Foam/compliant surface;Intermittent upper extremity assist    SLS with Vectors Limitations standing on blue air ex - alternating toe taps to 4" step x10 reps B, cues for full weight shift to LLE, intermittent taps to bars for balance    Step Ups Forward;Lateral;4 inch    Step Ups Limitations with blue air ex, step ups with LLE x10 reps each direction, no UE support in lateral direction, intermittent with forward direction, cues for hip ABD activation and weight shift                PT Education - 07/02/20 1216     Education Details results of SOT    Person(s) Educated Patient    Methods Explanation    Comprehension Verbalized understanding              PT Short Term Goals - 06/14/20 0941       PT SHORT TERM GOAL #1   Title Pt will be independent with initial HEP in order to build upon functional gains made in therapy. ALL STGS DUE 07/11/20    Time 4    Period Weeks    Status New    Target Date 07/11/20      PT SHORT TERM GOAL #2   Title Pt will undergo further assessment of 3MWT vs. 6MWT with single walking pole and LTG written as appropriate.    Baseline performed on 06/14/20 with 6MWT, ambulating 1039' with single walking pole    Time 4    Period Weeks    Status Achieved      PT SHORT TERM GOAL #3   Title Pt will ambulate at least 300' outdoors over unlevel surfaces with single walking pole and supervision in order to demo improved community mobility.    Time 4    Period Weeks    Status New      PT SHORT TERM GOAL #4   Title Pt will improve condition 4 on mCTSIB to at least 30 seconds in order to demo improved vestibular input for balance.    Baseline 25 seconds.    Time 4    Period Weeks    Status New      PT SHORT TERM GOAL #5   Title Pt will improve DGI score to at least a 17/24 in order to demo decr fall risk.    Baseline 15/24    Time 4    Period Weeks    Status New               PT Long Term Goals - 07/02/20 1214       PT LONG TERM GOAL #1   Title Pt will be independent with final HEP in order to build upon functional gains made in therapy. ALL LTGS DUE 08/08/20    Time 8    Period Weeks    Status New      PT LONG TERM GOAL #2   Title Pt will improve 6MWT distance by at least 100' with single walking pole and rating fatigue as a  2-3/10 or less in order to demo improved walking endurance.    Baseline 1039 with 4-5/10 fatigue rating.    Time 8    Period Weeks    Status Revised      PT LONG TERM GOAL  #3   Title Pt will ambulate at least 115' over indoor level surfaces with no AD and supervision in order to demo improved household mobility.    Time 8    Period Weeks    Status New      PT LONG TERM GOAL #4   Title Pt will improve DGI score to at least a 17/24 in order to demo decr fall risk.    Baseline 20/24    Time 8    Period Weeks    Status New      PT LONG TERM GOAL #5   Title Perform gait speed with no AD with LTG to be written as appropriate.    Baseline 3.09 ft/sec with single walking pole.    Time 8    Period Weeks    Status New      Additional Long Term Goals   Additional Long Term Goals Yes      PT LONG TERM GOAL #6   Title Pt will perform all conditions 2 and 3 of mCTSIB WNL and have preference score WNL in order to demo improved balance systems.    Baseline 2: WNL for one trial, below average all 3 trials  3: below average all 3 trials, preference = 80 (below norm)    Time 8    Period Weeks    Status New                   Plan - 07/02/20 1219     Clinical Impression Statement Performed the SOT today - see above for further details. Pt below normal values for sensory analysis for somatosensory and preference. Pt was above normal for visual and vestibular. Pt's COG was to the R and anteriorly throughout each condition. Remainder of session focused on balance strategies on compliant surfaces, weight shift to LLE, and LLE strengthening.    Personal Factors and Comorbidities Comorbidity 3+;Time since onset of injury/illness/exacerbation;Past/Current Experience    Comorbidities relapsing MS (diagnosed 2007), hypothyroidism, knee arthroscopy, Lyme disease, status post Morton's neurectomy and Weil osteotomy of the second and third metatarsal right foot (12/2019)    Examination-Activity Limitations Stairs;Transfers;Squat;Locomotion Level    Examination-Participation Restrictions Community Activity;Cleaning    Stability/Clinical Decision Making  Stable/Uncomplicated    Rehab Potential Good    PT Frequency 2x / week    PT Duration 8 weeks    PT Treatment/Interventions ADLs/Self Care Home Management;Aquatic Therapy;Stair training;Gait training;DME Instruction;Functional mobility training;Therapeutic activities;Therapeutic exercise;Balance training;Neuromuscular re-education;Patient/family education;Orthotic Fit/Training;Passive range of motion;Vestibular    PT Next Visit Plan continue work on LLE proximal hip strengthening (hip ABD/ext/hip flexors), balance with vision removed, compliant surfaces, head motions. weight shifting activities to L    Consulted and Agree with Plan of Care Patient             Patient will benefit from skilled therapeutic intervention in order to improve the following deficits and impairments:  Abnormal gait, Decreased activity tolerance, Decreased coordination, Decreased balance, Decreased endurance, Decreased range of motion, Decreased strength, Dizziness, Impaired sensation  Visit Diagnosis: Muscle weakness (generalized)  Other symptoms and signs involving the nervous system  Other abnormalities of gait and mobility  Unsteadiness on feet     Problem List Patient Active Problem  List   Diagnosis Date Noted   History of Lyme disease 06/22/2020   Pre-operative clearance 12/03/2018   High risk medication use 07/25/2018   Well woman exam without gynecological exam 11/21/2017   Left leg weakness 10/14/2015   Hypothyroidism (acquired) 11/03/2014   Other fatigue 11/03/2014   Gait disorder 11/03/2014   MULTIPLE SCLEROSIS 09/01/2008    Arliss Journey, PT, DPT 07/02/2020, 12:24 PM  [facility] [address] [place], [place], [address] Phone: [phone]   Fax:  [phone]  Name: Samantha Church MRN: [id number] Date of Birth: [birth year]

## 2020-07-05 ENCOUNTER — Ambulatory Visit: Payer: PRIVATE HEALTH INSURANCE

## 2020-07-05 ENCOUNTER — Other Ambulatory Visit: Payer: Self-pay

## 2020-07-05 DIAGNOSIS — R2681 Unsteadiness on feet: Secondary | ICD-10-CM

## 2020-07-05 DIAGNOSIS — M6281 Muscle weakness (generalized): Secondary | ICD-10-CM

## 2020-07-05 DIAGNOSIS — R2689 Other abnormalities of gait and mobility: Secondary | ICD-10-CM

## 2020-07-05 NOTE — Therapy (Signed)
[facility] [address] [place], [place], [address] Phone: [phone]   Fax:  [phone]  Physical Therapy Treatment  Patient Details  Name: Samantha Church MRN: [id number] Date of Birth: Oct 04, 1960 Referring Provider (PT): Felecia Shelling, Nanine Means, MD   Encounter Date: 07/05/2020   PT End of Session - 07/05/20 0932     Visit Number 8    Number of Visits 17    Date for PT Re-Evaluation 08/09/20    Authorization Type [employer] 2022, 60 VL combined (PT, OT, ST)    PT Start Time 0933    PT Stop Time 1014    PT Time Calculation (min) 41 min    Equipment Utilized During Treatment Gait belt    Activity Tolerance Patient tolerated treatment well    Behavior During Therapy [facility] for tasks assessed/performed             Past Medical History:  Diagnosis Date   Anemia    Hypothyroidism    Dr. [name]   Lyme disease    Multiple sclerosis ([place])    Neuromuscular disorder ([place])    MS   Vision abnormalities     Past Surgical History:  Procedure Laterality Date   AUGMENTATION MAMMAPLASTY Bilateral 2003   saline   BREAST IMPLANT REMOVAL Bilateral 02/17/2019   BREAST SURGERY  2002   COLONOSCOPY  2008   DILATATION & CURRETTAGE/HYSTEROSCOPY WITH RESECTOCOPE N/A 10/09/2012   Procedure: DILATATION & CURETTAGE/HYSTEROSCOPY WITH RESECTOCOPE;  Surgeon: [name], MD;  Location: [facility];  Service: Gynecology;  Laterality: N/A;  RESECTION with Versapoint    fibroid  2019   fibroidectomy   KNEE ARTHROSCOPY  1996   KNEE ARTHROSCOPY  1996    There were no vitals filed for this visit.   Subjective Assessment - 07/05/20 0935     Subjective Patient reports no new changes. No falls. No pain.    Pertinent History relapsing MS (diagnosed 2007), hypothyroidism, knee arthroscopy, Lyme disease, status post Morton's neurectomy and Weil osteotomy of the second and third metatarsal right foot (12/2019).    Limitations Walking    How long  can you walk comfortably? 10 minutes    Patient Stated Goals wants to be able to walk without any assistance, improve confidence when walking    Currently in Pain? No/denies                [facility] Adult PT Treatment/Exercise - 07/05/20 0001       Transfers   Transfers Sit to Stand;Stand to Sit    Sit to Stand 6: Modified independent (Device/Increase time)    Stand to Sit 6: Modified independent (Device/Increase time)      Ambulation/Gait   Ambulation/Gait Yes    Ambulation/Gait Assistance 5: Supervision    Ambulation/Gait Assistance Details into/out of therapy gym    Ambulation Distance (Feet) --   clinic distance   Assistive device Other (Comment)   single walking stick   Gait Pattern Decreased stance time - left;Decreased weight shift to left;Trendelenburg;Decreased hip/knee flexion - left;Left circumduction    Ambulation Surface Level;Indoor      Neuro Re-ed    Neuro Re-ed Details  Completed NMR seated on blue physioball for improved core stability, balance, and functional strengthening: completed pelvic tilts x 10 reps, PT providing cues for proper completion. Then progressed to addition of BLE exercise to further challenge balance/stability and promote strength, completed LAQ bilat x 10 reps each, then alteranting marching x 10 reps. Increased  challenge with floating LLE. CGA. Then completed core stabilization/balance seated on physioball with eyes closed 2 x 30 seconds; then progressed to addition of eyes closed and horizontal/vertical head turns x 10 reps each direction. Patient reports mild dizziness/dysequilibrium, but resolution quickly noted.      Exercises   Exercises Other Exercises    Other Exercises  PT educating on how to properly stretch for toe extension. Completed standing toe DF stretch, with PT providing cues 2 x 30 seconds. PT also teaching patient how to passively stretch if seated. With patient supine and legs on physioball completed isometric hamstring curl  into ball with 5 second hold x 10 reps. Then with BLE on physioball completed trunk rotation bilat x 10 reps, small range due to weakness noted. Cues for breathing throughout and core activation. Completed trunk rotaiton without physioball x 30 seconds each way to promote improved BLE hip mobility and thoracic/lumbar stretch.               Access Code: [facility] URL: [email or web address] Date: 07/05/2020 Prepared by: Baldomero Lamy  Exercises Clamshell - 2 x daily - 5 x weekly - 2 sets - 10 reps Supine Bridge with Resistance Band - 2 x daily - 5 x weekly - 1-2 sets - 10 reps Forward Step Touch - 2 x daily - 5 x weekly - 1 sets - 10 reps Romberg Stance Eyes Closed on Foam Pad - 2 x daily - 5 x weekly - 3 sets - 10 reps Backward Walking with Counter Support - 2 x daily - 5 x weekly - 3 sets Tall Kneeling Posterior Pelvic Tilt - 1 x daily - 7 x weekly - 2 sets - 10 reps Half Kneeling on Foam Pad - 1 x daily - 7 x weekly - 1 sets - 3-4 reps - 10 seconds hold Standing Toe Dorsiflexion Stretch - 1 x daily - 7 x weekly - 1 sets - 3 reps - 30 seconds hold      PT Education - 07/05/20 1115     Education Details HEP addition    Person(s) Educated Patient    Methods Explanation;Demonstration;Handout    Comprehension Verbalized understanding;Returned demonstration              PT Short Term Goals - 06/14/20 0941       PT SHORT TERM GOAL #1   Title Pt will be independent with initial HEP in order to build upon functional gains made in therapy. ALL STGS DUE 07/11/20    Time 4    Period Weeks    Status New    Target Date 07/11/20      PT SHORT TERM GOAL #2   Title Pt will undergo further assessment of 3MWT vs. 6MWT with single walking pole and LTG written as appropriate.    Baseline performed on 06/14/20 with 6MWT, ambulating 1039' with single walking pole    Time 4    Period Weeks    Status Achieved      PT SHORT TERM GOAL #3   Title Pt will ambulate at least  300' outdoors over unlevel surfaces with single walking pole and supervision in order to demo improved community mobility.    Time 4    Period Weeks    Status New      PT SHORT TERM GOAL #4   Title Pt will improve condition 4 on mCTSIB to at least 30 seconds in order to demo improved vestibular input for balance.    Baseline  25 seconds.    Time 4    Period Weeks    Status New      PT SHORT TERM GOAL #5   Title Pt will improve DGI score to at least a 17/24 in order to demo decr fall risk.    Baseline 15/24    Time 4    Period Weeks    Status New               PT Long Term Goals - 07/02/20 1214       PT LONG TERM GOAL #1   Title Pt will be independent with final HEP in order to build upon functional gains made in therapy. ALL LTGS DUE 08/08/20    Time 8    Period Weeks    Status New      PT LONG TERM GOAL #2   Title Pt will improve 6MWT distance by at least 100' with single walking pole and rating fatigue as a 2-3/10 or less in order to demo improved walking endurance.    Baseline 1039 with 4-5/10 fatigue rating.    Time 8    Period Weeks    Status Revised      PT LONG TERM GOAL #3   Title Pt will ambulate at least 115' over indoor level surfaces with no AD and supervision in order to demo improved household mobility.    Time 8    Period Weeks    Status New      PT LONG TERM GOAL #4   Title Pt will improve DGI score to at least a 17/24 in order to demo decr fall risk.    Baseline 20/24    Time 8    Period Weeks    Status New      PT LONG TERM GOAL #5   Title Perform gait speed with no AD with LTG to be written as appropriate.    Baseline 3.09 ft/sec with single walking pole.    Time 8    Period Weeks    Status New      Additional Long Term Goals   Additional Long Term Goals Yes      PT LONG TERM GOAL #6   Title Pt will perform all conditions 2 and 3 of mCTSIB WNL and have preference score WNL in order to demo improved balance systems.    Baseline 2: WNL  for one trial, below average all 3 trials  3: below average all 3 trials, preference = 80 (below norm)    Time 8    Period Weeks    Status New                   Plan - 07/05/20 1115     Clinical Impression Statement Completed toe DF stretch and added to HEP due to patient reports of tightness in area, patietn tolerating well. Continued NMR and strengthening activites with physioball today, patient tolerating well. Mild dysequilbirum and postural sway noted seated on unstable surface. Will continue to progress toward all LTGs.    Personal Factors and Comorbidities Comorbidity 3+;Time since onset of injury/illness/exacerbation;Past/Current Experience    Comorbidities relapsing MS (diagnosed 2007), hypothyroidism, knee arthroscopy, Lyme disease, status post Morton's neurectomy and Weil osteotomy of the second and third metatarsal right foot (12/2019)    Examination-Activity Limitations Stairs;Transfers;Squat;Locomotion Level    Examination-Participation Restrictions Community Activity;Cleaning    Stability/Clinical Decision Making Stable/Uncomplicated    Rehab Potential Good    PT Frequency 2x /  week    PT Duration 8 weeks    PT Treatment/Interventions ADLs/Self Care Home Management;Aquatic Therapy;Stair training;Gait training;DME Instruction;Functional mobility training;Therapeutic activities;Therapeutic exercise;Balance training;Neuromuscular re-education;Patient/family education;Orthotic Fit/Training;Passive range of motion;Vestibular    PT Next Visit Plan continue work on LLE proximal hip strengthening (hip ABD/ext/hip flexors), balance with vision removed, compliant surfaces, head motions. weight shifting activities to L    Consulted and Agree with Plan of Care Patient             Patient will benefit from skilled therapeutic intervention in order to improve the following deficits and impairments:  Abnormal gait, Decreased activity tolerance, Decreased coordination, Decreased  balance, Decreased endurance, Decreased range of motion, Decreased strength, Dizziness, Impaired sensation  Visit Diagnosis: Muscle weakness (generalized)  Other abnormalities of gait and mobility  Unsteadiness on feet     Problem List Patient Active Problem List   Diagnosis Date Noted   History of Lyme disease 06/22/2020   Pre-operative clearance 12/03/2018   High risk medication use 07/25/2018   Well woman exam without gynecological exam 11/21/2017   Left leg weakness 10/14/2015   Hypothyroidism (acquired) 11/03/2014   Other fatigue 11/03/2014   Gait disorder 11/03/2014   MULTIPLE SCLEROSIS 09/01/2008    Jones Bales, PT, DPT 07/05/2020, 11:55 AM  [facility] [address] [place], [place], [address] Phone: [phone]   Fax:  [phone]  Name: DEAYSIA GRIGORYAN MRN: [id number] Date of Birth: 28-Apr-1960

## 2020-07-05 NOTE — Patient Instructions (Signed)
Access Code: [facility] URL: [email or web address] Date: 07/05/2020 Prepared by: Baldomero Lamy  Exercises Clamshell - 2 x daily - 5 x weekly - 2 sets - 10 reps Supine Bridge with Resistance Band - 2 x daily - 5 x weekly - 1-2 sets - 10 reps Forward Step Touch - 2 x daily - 5 x weekly - 1 sets - 10 reps Romberg Stance Eyes Closed on Foam Pad - 2 x daily - 5 x weekly - 3 sets - 10 reps Backward Walking with Counter Support - 2 x daily - 5 x weekly - 3 sets Tall Kneeling Posterior Pelvic Tilt - 1 x daily - 7 x weekly - 2 sets - 10 reps Half Kneeling on Foam Pad - 1 x daily - 7 x weekly - 1 sets - 3-4 reps - 10 seconds hold Standing Toe Dorsiflexion Stretch - 1 x daily - 7 x weekly - 1 sets - 3 reps - 30 seconds hold

## 2020-07-07 ENCOUNTER — Other Ambulatory Visit: Payer: Self-pay

## 2020-07-07 ENCOUNTER — Encounter: Payer: Self-pay | Admitting: Physical Therapy

## 2020-07-07 ENCOUNTER — Ambulatory Visit: Payer: PRIVATE HEALTH INSURANCE | Admitting: Physical Therapy

## 2020-07-07 DIAGNOSIS — R2681 Unsteadiness on feet: Secondary | ICD-10-CM | POA: Diagnosis not present

## 2020-07-07 DIAGNOSIS — M6281 Muscle weakness (generalized): Secondary | ICD-10-CM

## 2020-07-07 DIAGNOSIS — R2689 Other abnormalities of gait and mobility: Secondary | ICD-10-CM

## 2020-07-07 DIAGNOSIS — R29818 Other symptoms and signs involving the nervous system: Secondary | ICD-10-CM

## 2020-07-07 NOTE — Therapy (Addendum)
[facility] [address] [place], [place], [address] Phone: [phone]   Fax:  [phone]  Physical Therapy Treatment  Patient Details  Name: Samantha Church MRN: [id number] Date of Birth: 02-Jan-1961 Referring Provider (PT): Felecia Shelling, Nanine Means, MD   Encounter Date: 07/07/2020   PT End of Session - 07/07/20 1014     Visit Number 9    Number of Visits 17    Date for PT Re-Evaluation 08/09/20    Authorization Type [employer] 2022, 60 VL combined (PT, OT, ST)    PT Start Time 0933    PT Stop Time 1013    PT Time Calculation (min) 40 min    Equipment Utilized During Treatment Gait belt    Activity Tolerance Patient tolerated treatment well    Behavior During Therapy [facility] for tasks assessed/performed             Past Medical History:  Diagnosis Date   Anemia    Hypothyroidism    Dr. [name]   Lyme disease    Multiple sclerosis ([place])    Neuromuscular disorder ([place])    MS   Vision abnormalities     Past Surgical History:  Procedure Laterality Date   AUGMENTATION MAMMAPLASTY Bilateral 2003   saline   BREAST IMPLANT REMOVAL Bilateral 02/17/2019   BREAST SURGERY  2002   COLONOSCOPY  2008   DILATATION & CURRETTAGE/HYSTEROSCOPY WITH RESECTOCOPE N/A 10/09/2012   Procedure: DILATATION & CURETTAGE/HYSTEROSCOPY WITH RESECTOCOPE;  Surgeon: [name], MD;  Location: [facility];  Service: Gynecology;  Laterality: N/A;  RESECTION with Versapoint    fibroid  2019   fibroidectomy   KNEE ARTHROSCOPY  1996   KNEE ARTHROSCOPY  1996    There were no vitals filed for this visit.   Subjective Assessment - 07/07/20 0935     Subjective Tried the stretch at home and felt it more on the bottom of her foot and made it a little sore.    Pertinent History relapsing MS (diagnosed 2007), hypothyroidism, knee arthroscopy, Lyme disease, status post Morton's neurectomy and Weil osteotomy of the second and third metatarsal right foot  (12/2019).    Limitations Walking    How long can you walk comfortably? 10 minutes    Patient Stated Goals wants to be able to walk without any assistance, improve confidence when walking    Currently in Pain? No/denies                [facility] PT Assessment - 07/07/20 0941       Dynamic Gait Index   Level Surface Mild Impairment   7.97   Change in Gait Speed Mild Impairment    Gait with Horizontal Head Turns Mild Impairment    Gait with Vertical Head Turns Mild Impairment    Gait and Pivot Turn Normal    Step Over Obstacle Mild Impairment    Step Around Obstacles Normal    Steps Mild Impairment    Total Score 18    DGI comment: 18/24      Functional Gait  Assessment   Gait assessed  Yes    Gait Level Surface Walks 20 ft, slow speed, abnormal gait pattern, evidence for imbalance or deviates 10-15 in outside of the 12 in walkway width. Requires more than 7 sec to ambulate 20 ft.   7.97   Change in Gait Speed Able to change speed, demonstrates mild gait deviations, deviates 6-10 in outside of the 12 in walkway width, or  no gait deviations, unable to achieve a major change in velocity, or uses a change in velocity, or uses an assistive device.    Gait with Horizontal Head Turns Performs head turns smoothly with slight change in gait velocity (eg, minor disruption to smooth gait path), deviates 6-10 in outside 12 in walkway width, or uses an assistive device.    Gait with Vertical Head Turns Performs task with slight change in gait velocity (eg, minor disruption to smooth gait path), deviates 6 - 10 in outside 12 in walkway width or uses assistive device    Gait and Pivot Turn Pivot turns safely within 3 sec and stops quickly with no loss of balance.    Step Over Obstacle Is able to step over one shoe box (4.5 in total height) without changing gait speed. No evidence of imbalance.    Gait with Narrow Base of Support Ambulates less than 4 steps heel to toe or cannot perform without  assistance.    Gait with Eyes Closed Walks 20 ft, slow speed, abnormal gait pattern, evidence for imbalance, deviates 10-15 in outside 12 in walkway width. Requires more than 9 sec to ambulate 20 ft.    Ambulating Backwards Walks 20 ft, uses assistive device, slower speed, mild gait deviations, deviates 6-10 in outside 12 in walkway width.    Steps Alternating feet, must use rail.    Total Score 17    FGA comment: 17/30 = high fall risk                           [facility] Adult PT Treatment/Exercise - 07/07/20 1002       Ambulation/Gait   Ambulation/Gait Yes    Ambulation/Gait Assistance 5: Supervision    Ambulation/Gait Assistance Details between activities and during testing with no AD    Assistive device None    Gait Pattern Decreased stance time - left;Decreased weight shift to left;Trendelenburg;Decreased hip/knee flexion - left;Left circumduction    Ambulation Surface Level;Indoor      Neuro Re-ed    Neuro Re-ed Details  on mat table in half kneeling with LLE posteriorly for BLE/core strengthening and weight shifting: x10 reps head turns, x10 reps head turns, holding 2# ball trunk rotation R/L x5 reps (performed this same activity with RLE forwards), then tall kneeling with holding 2# ball (bringing ball out when kneeling and then bringing it in to chest) x10 reps with tactile cues for weight shift to LLE.                 Balance Exercises - 07/07/20 0940       Balance Exercises: Standing   Standing Eyes Closed Narrow base of support (BOS)    Standing Eyes Closed Limitations on blue air ex: 2 x 30 seconds    Tandem Stance Eyes open;Limitations    Tandem Stance Time slightly partial tandem with LLE posteriorly 3 x 30 seconds    Stepping Strategy Posterior;Limitations    Stepping Strategy Limitations on blue air ex, keeping LLE planted and bringing R foot on and off air ex x10 reps for SLS no UE support    Tandem Gait Forward;Intermittent upper extremity  support;3 reps;Limitations;Upper extremity support    Tandem Gait Limitations down and back 4 reps at the countertop               PT Education - 07/07/20 1014     Education Details progress towards goals    Person(s)  Educated Patient    Methods Explanation    Comprehension Verbalized understanding              PT Short Term Goals - 07/07/20 0940       PT SHORT TERM GOAL #1   Title Pt will be independent with initial HEP in order to build upon functional gains made in therapy. ALL STGS DUE 07/11/20    Time 4    Period Weeks    Status New    Target Date 07/11/20      PT SHORT TERM GOAL #2   Title Pt will undergo further assessment of 3MWT vs. 6MWT with single walking pole and LTG written as appropriate.    Baseline performed on 06/14/20 with 6MWT, ambulating 1039' with single walking pole    Time 4    Period Weeks    Status Achieved      PT SHORT TERM GOAL #3   Title Pt will ambulate at least 300' outdoors over unlevel surfaces with single walking pole and supervision in order to demo improved community mobility.    Time 4    Period Weeks    Status New      PT SHORT TERM GOAL #4   Title Pt will improve condition 4 on mCTSIB to at least 30 seconds in order to demo improved vestibular input for balance.    Baseline 25 seconds; 30 seconds on 07/07/20    Time 4    Period Weeks    Status Achieved      PT SHORT TERM GOAL #5   Title Pt will improve DGI score to at least a 17/24 in order to demo decr fall risk.    Baseline 15/24; 18/24 on 07/07/20 with no AD    Time 4    Period Weeks    Status Achieved                PT Long Term Goals - 07/07/20 1015       PT LONG TERM GOAL #1   Title Pt will be independent with final HEP in order to build upon functional gains made in therapy. ALL LTGS DUE 08/08/20    Time 8    Period Weeks    Status New      PT LONG TERM GOAL #2   Title Pt will improve 6MWT distance by at least 100' with single walking pole and rating  fatigue as a 2-3/10 or less in order to demo improved walking endurance.    Baseline 1039 with 4-5/10 fatigue rating.    Time 8    Period Weeks    Status Revised      PT LONG TERM GOAL #3   Title Pt will ambulate at least 115' over indoor level surfaces with no AD and supervision in order to demo improved household mobility.    Time 8    Period Weeks    Status New      PT LONG TERM GOAL #4   Title Pt will improve DGI score to at least a 21/24 in order to demo decr fall risk and FGA score to at least a 21/30 to decr fall risk.    Baseline DGI 18/24 on 07/07/20, FGA 17/30    Time 8    Period Weeks    Status Revised      PT LONG TERM GOAL #5   Title Perform gait speed with no AD with LTG to be written as appropriate.  Baseline 3.09 ft/sec with single walking pole.    Time 8    Period Weeks    Status New      PT LONG TERM GOAL #6   Title Pt will perform all conditions 2 and 3 of mCTSIB WNL and have preference score WNL in order to demo improved balance systems.    Baseline 2: WNL for one trial, below average all 3 trials  3: below average all 3 trials, preference = 80 (below norm)    Time 8    Period Weeks    Status New                   Plan - 07/07/20 1118     Clinical Impression Statement Began to assess STGs today with pt scoring a 18/24 on the DGI today without an AD (previously was 15/24 with use of walking stick). Also performed the FGA for a higher level balance test with pt scoring a 17/30 with no AD, indicating an incr fall risk. Pt met STG #4 - able to perform condition 4 of mCTSIB for 30 seconds indicating incr vestibular input for balance. Remainder of session focused of standing balance strategies and core/BLE strengthening. Pt challenged by balance with more narrow BOS, esp tandem. Will continue to progress towards LTGs.    Personal Factors and Comorbidities Comorbidity 3+;Time since onset of injury/illness/exacerbation;Past/Current Experience     Comorbidities relapsing MS (diagnosed 2007), hypothyroidism, knee arthroscopy, Lyme disease, status post Morton's neurectomy and Weil osteotomy of the second and third metatarsal right foot (12/2019)    Examination-Activity Limitations Stairs;Transfers;Squat;Locomotion Level    Examination-Participation Restrictions Community Activity;Cleaning    Stability/Clinical Decision Making Stable/Uncomplicated    Rehab Potential Good    PT Frequency 2x / week    PT Duration 8 weeks    PT Treatment/Interventions ADLs/Self Care Home Management;Aquatic Therapy;Stair training;Gait training;DME Instruction;Functional mobility training;Therapeutic activities;Therapeutic exercise;Balance training;Neuromuscular re-education;Patient/family education;Orthotic Fit/Training;Passive range of motion;Vestibular    PT Next Visit Plan continue work on LLE proximal hip strengthening (hip ABD/ext/hip flexors), balance with vision removed, compliant surfaces, head motions. weight shifting activities to L, tandem gait and balance, gait with no AD.    Consulted and Agree with Plan of Care Patient             Patient will benefit from skilled therapeutic intervention in order to improve the following deficits and impairments:  Abnormal gait, Decreased activity tolerance, Decreased coordination, Decreased balance, Decreased endurance, Decreased range of motion, Decreased strength, Dizziness, Impaired sensation  Visit Diagnosis: Muscle weakness (generalized)  Other abnormalities of gait and mobility  Unsteadiness on feet  Other symptoms and signs involving the nervous system     Problem List Patient Active Problem List   Diagnosis Date Noted   History of Lyme disease 06/22/2020   Pre-operative clearance 12/03/2018   High risk medication use 07/25/2018   Well woman exam without gynecological exam 11/21/2017   Left leg weakness 10/14/2015   Hypothyroidism (acquired) 11/03/2014   Other fatigue 11/03/2014   Gait  disorder 11/03/2014   MULTIPLE SCLEROSIS 09/01/2008    Arliss Journey, PT,DPT  07/07/2020, 11:18 AM  [facility] [address] [place], [place], [address] Phone: [phone]   Fax:  [phone]  Name: Samantha Church MRN: [id number] Date of Birth: January 19, 1960

## 2020-07-14 ENCOUNTER — Ambulatory Visit: Payer: PRIVATE HEALTH INSURANCE | Admitting: Physical Therapy

## 2020-07-15 ENCOUNTER — Ambulatory Visit: Payer: PRIVATE HEALTH INSURANCE | Attending: Neurology

## 2020-07-15 ENCOUNTER — Other Ambulatory Visit: Payer: Self-pay

## 2020-07-15 DIAGNOSIS — R2689 Other abnormalities of gait and mobility: Secondary | ICD-10-CM | POA: Diagnosis present

## 2020-07-15 DIAGNOSIS — R29818 Other symptoms and signs involving the nervous system: Secondary | ICD-10-CM | POA: Diagnosis present

## 2020-07-15 DIAGNOSIS — R2681 Unsteadiness on feet: Secondary | ICD-10-CM | POA: Diagnosis present

## 2020-07-15 DIAGNOSIS — M6281 Muscle weakness (generalized): Secondary | ICD-10-CM | POA: Diagnosis present

## 2020-07-15 NOTE — Therapy (Signed)
[facility] [address] [place], [place], [address] Phone: [phone]   Fax:  [phone]  Physical Therapy Treatment  Patient Details  Name: Samantha Church MRN: [id number] Date of Birth: 1960-11-09 Referring Provider (PT): Felecia Shelling, Nanine Means, MD   Encounter Date: 07/15/2020   PT End of Session - 07/15/20 1657     Visit Number 10    Number of Visits 17    Date for PT Re-Evaluation 08/09/20    Authorization Type [employer] 2022, 60 VL combined (PT, OT, ST)    PT Start Time 1658    PT Stop Time 1741    PT Time Calculation (min) 43 min    Equipment Utilized During Treatment Gait belt    Activity Tolerance Patient tolerated treatment well    Behavior During Therapy [facility] for tasks assessed/performed             Past Medical History:  Diagnosis Date   Anemia    Hypothyroidism    Dr. [name]   Lyme disease    Multiple sclerosis ([place])    Neuromuscular disorder ([place])    MS   Vision abnormalities     Past Surgical History:  Procedure Laterality Date   AUGMENTATION MAMMAPLASTY Bilateral 2003   saline   BREAST IMPLANT REMOVAL Bilateral 02/17/2019   BREAST SURGERY  2002   COLONOSCOPY  2008   DILATATION & CURRETTAGE/HYSTEROSCOPY WITH RESECTOCOPE N/A 10/09/2012   Procedure: DILATATION & CURETTAGE/HYSTEROSCOPY WITH RESECTOCOPE;  Surgeon: [name], MD;  Location: [facility];  Service: Gynecology;  Laterality: N/A;  RESECTION with Versapoint    fibroid  2019   fibroidectomy   KNEE ARTHROSCOPY  1996   KNEE ARTHROSCOPY  1996    There were no vitals filed for this visit.   Subjective Assessment - 07/15/20 1700     Subjective Patient reports no new changes complaints. No falls or pain to report. Reports exercises are going well. Reports that feels like heat hsa been affecting her.    Pertinent History relapsing MS (diagnosed 2007), hypothyroidism, knee arthroscopy, Lyme disease, status post Morton's neurectomy and  Weil osteotomy of the second and third metatarsal right foot (12/2019).    Limitations Walking    How long can you walk comfortably? 10 minutes    Patient Stated Goals wants to be able to walk without any assistance, improve confidence when walking    Currently in Pain? No/denies               [facility] Adult PT Treatment/Exercise - 07/15/20 0001       Transfers   Transfers Sit to Stand;Stand to Sit    Sit to Stand 6: Modified independent (Device/Increase time)    Stand to Sit 6: Modified independent (Device/Increase time)      Ambulation/Gait   Ambulation/Gait Yes    Ambulation/Gait Assistance 5: Supervision    Ambulation/Gait Assistance Details completed ambulation outdoors on unlevel surfaces with single walking stick, ambulation x 400 ft on grass and paved surface. supervision overall. more challenge noted on grass but able to maintain balance without assistance from PT.    Ambulation Distance (Feet) 400 Feet    Assistive device None    Gait Pattern Decreased stance time - left;Decreased weight shift to left;Trendelenburg;Decreased hip/knee flexion - left;Left circumduction    Ambulation Surface Unlevel;Outdoor;Paved;Grass             Completed all of the following exercises as review of HEP. Patient tolerating progression of exercises well.  Bolded items are new additions/changes made to HEP.   Access Code: [facility] URL: [email or web address] Date: 07/15/2020 Prepared by: Baldomero Lamy  Exercises Clamshell with Resistance - 2 x daily - 5 x weekly - 2 sets - 10 reps -  added in resistance band  Supine Bridge with Resistance Band - 2 x daily - 5 x weekly - 1-2 sets - 10 reps - progressed resistance band to blue Forward Step Touch - 2 x daily - 5 x weekly - 2 sets - 10 reps - reduced UE support w/ completion Romberg Stance Eyes Closed on Foam Pad - 2 x daily - 5 x weekly - 3 sets - 10 reps - educated to trial w/ 2 pillows vs 1 pillow at home for further  challenge Backward Walking with Counter Support - 2 x daily - 5 x weekly - 3 sets - cues for increased step length Tall Kneeling Posterior Pelvic Tilt - 1 x daily - 5 x weekly - 2 sets - 10 reps - Half Kneeling Chop - 1 x daily - 5 x weekly - 2 sets - 10 reps - added in diagonal chops for further challenge Standing Toe Dorsiflexion Stretch - 1 x daily - 5 x weekly - 1 sets - 3 reps - 30 seconds hold        PT Education - 07/15/20 1747     Education Details updated HEP; progress toward STGs    Person(s) Educated Patient    Methods Explanation;Demonstration;Handout    Comprehension Verbalized understanding;Returned demonstration              PT Short Term Goals - 07/15/20 1708       PT SHORT TERM GOAL #1   Title Pt will be independent with initial HEP in order to build upon functional gains made in therapy. ALL STGS DUE 07/11/20    Baseline independent with initial HEP, progressed on 7/7    Time 4    Period Weeks    Status Achieved    Target Date 07/11/20      PT SHORT TERM GOAL #2   Title Pt will undergo further assessment of 3MWT vs. 6MWT with single walking pole and LTG written as appropriate.    Baseline performed on 06/14/20 with 6MWT, ambulating 1039' with single walking pole    Time 4    Period Weeks    Status Achieved      PT SHORT TERM GOAL #3   Title Pt will ambulate at least 300' outdoors over unlevel surfaces with single walking pole and supervision in order to demo improved community mobility.    Baseline 400' outdoors unlevel surfaces (grass/pavement) w/ walking pole and supervision    Time 4    Period Weeks    Status Achieved      PT SHORT TERM GOAL #4   Title Pt will improve condition 4 on mCTSIB to at least 30 seconds in order to demo improved vestibular input for balance.    Baseline 25 seconds; 30 seconds on 07/07/20    Time 4    Period Weeks    Status Achieved      PT SHORT TERM GOAL #5   Title Pt will improve DGI score to at least a 17/24 in  order to demo decr fall risk.    Baseline 15/24; 18/24 on 07/07/20 with no AD    Time 4    Period Weeks    Status Achieved  PT Long Term Goals - 07/07/20 1015       PT LONG TERM GOAL #1   Title Pt will be independent with final HEP in order to build upon functional gains made in therapy. ALL LTGS DUE 08/08/20    Time 8    Period Weeks    Status New      PT LONG TERM GOAL #2   Title Pt will improve 6MWT distance by at least 100' with single walking pole and rating fatigue as a 2-3/10 or less in order to demo improved walking endurance.    Baseline 1039 with 4-5/10 fatigue rating.    Time 8    Period Weeks    Status Revised      PT LONG TERM GOAL #3   Title Pt will ambulate at least 115' over indoor level surfaces with no AD and supervision in order to demo improved household mobility.    Time 8    Period Weeks    Status New      PT LONG TERM GOAL #4   Title Pt will improve DGI score to at least a 21/24 in order to demo decr fall risk and FGA score to at least a 21/30 to decr fall risk.    Baseline DGI 18/24 on 07/07/20, FGA 17/30    Time 8    Period Weeks    Status Revised      PT LONG TERM GOAL #5   Title Perform gait speed with no AD with LTG to be written as appropriate.    Baseline 3.09 ft/sec with single walking pole.    Time 8    Period Weeks    Status New      PT LONG TERM GOAL #6   Title Pt will perform all conditions 2 and 3 of mCTSIB WNL and have preference score WNL in order to demo improved balance systems.    Baseline 2: WNL for one trial, below average all 3 trials  3: below average all 3 trials, preference = 80 (below norm)    Time 8    Period Weeks    Status New                   Plan - 07/15/20 1746     Clinical Impression Statement Finished assesment of patient's progress toward STG, patient able to meet all STGs at this time. Patient ambulating x 400 ft outdoors on unlevel surfaces w/ use of single walking stick at  supervision level. rest of session focused on review and progessing HEP. Patient tolerating progression of HEP today. Will continue to progress toward all LTGs.    Personal Factors and Comorbidities Comorbidity 3+;Time since onset of injury/illness/exacerbation;Past/Current Experience    Comorbidities relapsing MS (diagnosed 2007), hypothyroidism, knee arthroscopy, Lyme disease, status post Morton's neurectomy and Weil osteotomy of the second and third metatarsal right foot (12/2019)    Examination-Activity Limitations Stairs;Transfers;Squat;Locomotion Level    Examination-Participation Restrictions Community Activity;Cleaning    Stability/Clinical Decision Making Stable/Uncomplicated    Rehab Potential Good    PT Frequency 2x / week    PT Duration 8 weeks    PT Treatment/Interventions ADLs/Self Care Home Management;Aquatic Therapy;Stair training;Gait training;DME Instruction;Functional mobility training;Therapeutic activities;Therapeutic exercise;Balance training;Neuromuscular re-education;Patient/family education;Orthotic Fit/Training;Passive range of motion;Vestibular    PT Next Visit Plan continue work on LLE proximal hip strengthening (hip ABD/ext/hip flexors), balance with vision removed, compliant surfaces, head motions. weight shifting activities to L, tandem gait and balance, gait with no AD.  Consulted and Agree with Plan of Care Patient             Patient will benefit from skilled therapeutic intervention in order to improve the following deficits and impairments:  Abnormal gait, Decreased activity tolerance, Decreased coordination, Decreased balance, Decreased endurance, Decreased range of motion, Decreased strength, Dizziness, Impaired sensation  Visit Diagnosis: Muscle weakness (generalized)  Unsteadiness on feet  Other abnormalities of gait and mobility     Problem List Patient Active Problem List   Diagnosis Date Noted   History of Lyme disease 06/22/2020    Pre-operative clearance 12/03/2018   High risk medication use 07/25/2018   Well woman exam without gynecological exam 11/21/2017   Left leg weakness 10/14/2015   Hypothyroidism (acquired) 11/03/2014   Other fatigue 11/03/2014   Gait disorder 11/03/2014   MULTIPLE SCLEROSIS 09/01/2008    Jones Bales, PT, DPT 07/15/2020, 5:50 PM  [facility] [address] [place], [place], [address] Phone: [phone]   Fax:  [phone]  Name: MARVENA TALLY MRN: [id number] Date of Birth: 1960/01/29

## 2020-07-15 NOTE — Patient Instructions (Signed)
Access Code: [facility] URL: [email or web address] Date: 07/15/2020 Prepared by: Baldomero Lamy  Exercises Clamshell with Resistance - 2 x daily - 5 x weekly - 2 sets - 10 reps Supine Bridge with Resistance Band - 2 x daily - 5 x weekly - 1-2 sets - 10 reps Forward Step Touch - 2 x daily - 5 x weekly - 2 sets - 10 reps Romberg Stance Eyes Closed on Foam Pad - 2 x daily - 5 x weekly - 3 sets - 10 reps Backward Walking with Counter Support - 2 x daily - 5 x weekly - 3 sets Tall Kneeling Posterior Pelvic Tilt - 1 x daily - 5 x weekly - 2 sets - 10 reps Half Kneeling Chop - 1 x daily - 5 x weekly - 2 sets - 10 reps Standing Toe Dorsiflexion Stretch - 1 x daily - 5 x weekly - 1 sets - 3 reps - 30 seconds hold

## 2020-07-16 ENCOUNTER — Ambulatory Visit: Payer: PRIVATE HEALTH INSURANCE

## 2020-07-16 DIAGNOSIS — R2689 Other abnormalities of gait and mobility: Secondary | ICD-10-CM

## 2020-07-16 DIAGNOSIS — R2681 Unsteadiness on feet: Secondary | ICD-10-CM

## 2020-07-16 DIAGNOSIS — M6281 Muscle weakness (generalized): Secondary | ICD-10-CM

## 2020-07-16 NOTE — Therapy (Signed)
[facility] [address] [place], [place], [address] Phone: [phone]   Fax:  [phone]  Physical Therapy Treatment  Patient Details  Name: Samantha Church MRN: [id number] Date of Birth: [birth year] Referring Provider (PT): Britt Bottom, MD   Encounter Date: 07/16/2020   PT End of Session - 07/16/20 0934     Visit Number 11    Number of Visits 17    Date for PT Re-Evaluation 08/09/20    Authorization Type [employer] 2022, 60 VL combined (PT, OT, ST)    PT Start Time 0932    PT Stop Time 1013    PT Time Calculation (min) 41 min    Equipment Utilized During Treatment Gait belt    Activity Tolerance Patient tolerated treatment well    Behavior During Therapy [facility] for tasks assessed/performed             Past Medical History:  Diagnosis Date   Anemia    Hypothyroidism    Dr. [name]   Lyme disease    Multiple sclerosis ([place])    Neuromuscular disorder ([place])    MS   Vision abnormalities     Past Surgical History:  Procedure Laterality Date   AUGMENTATION MAMMAPLASTY Bilateral 2003   saline   BREAST IMPLANT REMOVAL Bilateral 02/17/2019   BREAST SURGERY  2002   COLONOSCOPY  2008   DILATATION & CURRETTAGE/HYSTEROSCOPY WITH RESECTOCOPE N/A 10/09/2012   Procedure: DILATATION & CURETTAGE/HYSTEROSCOPY WITH RESECTOCOPE;  Surgeon: [name], MD;  Location: [facility];  Service: Gynecology;  Laterality: N/A;  RESECTION with Versapoint    fibroid  2019   fibroidectomy   KNEE ARTHROSCOPY  1996   KNEE ARTHROSCOPY  1996    There were no vitals filed for this visit.   Subjective Assessment - 07/16/20 0935     Subjective No new changes/complaints. DId not complete the exercises last night.    Pertinent History relapsing MS (diagnosed 2007), hypothyroidism, knee arthroscopy, Lyme disease, status post Morton's neurectomy and Weil osteotomy of the second and third metatarsal right foot (12/2019).    Limitations  Walking    How long can you walk comfortably? 10 minutes    Patient Stated Goals wants to be able to walk without any assistance, improve confidence when walking    Currently in Pain? No/denies                  [facility] Adult PT Treatment/Exercise - 07/16/20 0001       Transfers   Transfers Sit to Stand;Stand to Sit    Sit to Stand 6: Modified independent (Device/Increase time)    Stand to Sit 6: Modified independent (Device/Increase time)      Ambulation/Gait   Ambulation/Gait Yes    Ambulation/Gait Assistance 5: Supervision    Ambulation/Gait Assistance Details ambulation throughout therapy session with high level balance activities    Assistive device None    Gait Pattern Decreased stance time - left;Decreased weight shift to left;Trendelenburg;Decreased hip/knee flexion - left;Left circumduction    Ambulation Surface Level;Indoor      High Level Balance   High Level Balance Activities Head turns;Negotiating over obstacles    High Level Balance Comments Completed ambulation with negotiating over orange hurdles followed by head turns (horiz/vertical) with ambulation, completed x 3 laps down and back (approx 70') each way. Increased challenge with LLE trailing when negotiating over obstacles, cues to initiate step over w/ LLE at this time for improved balance with completion. Increased  challenge with head movement w/ ambulation, CGA required.      Exercises   Exercises Knee/Hip;Other Exercises    Other Exercises  PT assesing patient foot due to reports of discomfort and feeling as she is walking on a rock/ball on foot. PT palpating and reporting that she does have an area where a metatarsal is slightly lower causing a potential area of pressure on the bottom of foot. Light tenderness to palpation today. PT educating on how to stretch muscle surrounding region, as well as using frozen waterball to massage for improvements in pain and tension.      Knee/Hip Exercises: Aerobic    Other Aerobic Completed SciFit on Level 2.5 with BUE/BLE x 7 minutes for improved strengthening/endurance. Patient tolerating well.                 Balance Exercises - 07/16/20 0001       Balance Exercises: Standing   Standing Eyes Closed Wide (BOA);Narrow base of support (BOS);Head turns;Foam/compliant surface;Limitations    Standing Eyes Closed Limitations standing on airex: completed wide BOS progressing to more narrow BOS with eyes closed, horizontal/vertical head trns x 10 reps. increased postural sway with narrwo BOS requiring CGA from PT, increased sway to R > L. Cues for improved focus on proprioceptive input to maintain balance.                 PT Short Term Goals - 07/15/20 1708       PT SHORT TERM GOAL #1   Title Pt will be independent with initial HEP in order to build upon functional gains made in therapy. ALL STGS DUE 07/11/20    Baseline independent with initial HEP, progressed on 7/7    Time 4    Period Weeks    Status Achieved    Target Date 07/11/20      PT SHORT TERM GOAL #2   Title Pt will undergo further assessment of 3MWT vs. 6MWT with single walking pole and LTG written as appropriate.    Baseline performed on 06/14/20 with 6MWT, ambulating 1039' with single walking pole    Time 4    Period Weeks    Status Achieved      PT SHORT TERM GOAL #3   Title Pt will ambulate at least 300' outdoors over unlevel surfaces with single walking pole and supervision in order to demo improved community mobility.    Baseline 400' outdoors unlevel surfaces (grass/pavement) w/ walking pole and supervision    Time 4    Period Weeks    Status Achieved      PT SHORT TERM GOAL #4   Title Pt will improve condition 4 on mCTSIB to at least 30 seconds in order to demo improved vestibular input for balance.    Baseline 25 seconds; 30 seconds on 07/07/20    Time 4    Period Weeks    Status Achieved      PT SHORT TERM GOAL #5   Title Pt will improve DGI score to at  least a 17/24 in order to demo decr fall risk.    Baseline 15/24; 18/24 on 07/07/20 with no AD    Time 4    Period Weeks    Status Achieved               PT Long Term Goals - 07/07/20 1015       PT LONG TERM GOAL #1   Title Pt will be independent with final HEP in  order to build upon functional gains made in therapy. ALL LTGS DUE 08/08/20    Time 8    Period Weeks    Status New      PT LONG TERM GOAL #2   Title Pt will improve 6MWT distance by at least 100' with single walking pole and rating fatigue as a 2-3/10 or less in order to demo improved walking endurance.    Baseline 1039 with 4-5/10 fatigue rating.    Time 8    Period Weeks    Status Revised      PT LONG TERM GOAL #3   Title Pt will ambulate at least 115' over indoor level surfaces with no AD and supervision in order to demo improved household mobility.    Time 8    Period Weeks    Status New      PT LONG TERM GOAL #4   Title Pt will improve DGI score to at least a 21/24 in order to demo decr fall risk and FGA score to at least a 21/30 to decr fall risk.    Baseline DGI 18/24 on 07/07/20, FGA 17/30    Time 8    Period Weeks    Status Revised      PT LONG TERM GOAL #5   Title Perform gait speed with no AD with LTG to be written as appropriate.    Baseline 3.09 ft/sec with single walking pole.    Time 8    Period Weeks    Status New      PT LONG TERM GOAL #6   Title Pt will perform all conditions 2 and 3 of mCTSIB WNL and have preference score WNL in order to demo improved balance systems.    Baseline 2: WNL for one trial, below average all 3 trials  3: below average all 3 trials, preference = 80 (below norm)    Time 8    Period Weeks    Status New                   Plan - 07/16/20 1106     Clinical Impression Statement Today's session focused on continued high level balance exercises and focus on complaint surfaces for further challenge. PT continued education and management of  tenderness/discomfort patient experiences in the foot. Will continue per POC and progress toward LTGs.    Personal Factors and Comorbidities Comorbidity 3+;Time since onset of injury/illness/exacerbation;Past/Current Experience    Comorbidities relapsing MS (diagnosed 2007), hypothyroidism, knee arthroscopy, Lyme disease, status post Morton's neurectomy and Weil osteotomy of the second and third metatarsal right foot (12/2019)    Examination-Activity Limitations Stairs;Transfers;Squat;Locomotion Level    Examination-Participation Restrictions Community Activity;Cleaning    Stability/Clinical Decision Making Stable/Uncomplicated    Rehab Potential Good    PT Frequency 2x / week    PT Duration 8 weeks    PT Treatment/Interventions ADLs/Self Care Home Management;Aquatic Therapy;Stair training;Gait training;DME Instruction;Functional mobility training;Therapeutic activities;Therapeutic exercise;Balance training;Neuromuscular re-education;Patient/family education;Orthotic Fit/Training;Passive range of motion;Vestibular    PT Next Visit Plan continue work on LLE proximal hip strengthening (hip ABD/ext/hip flexors), balance with vision removed, compliant surfaces, head motions. weight shifting activities to L, tandem gait and balance, gait with no AD.    Consulted and Agree with Plan of Care Patient             Patient will benefit from skilled therapeutic intervention in order to improve the following deficits and impairments:  Abnormal gait, Decreased activity tolerance, Decreased coordination, Decreased  balance, Decreased endurance, Decreased range of motion, Decreased strength, Dizziness, Impaired sensation  Visit Diagnosis: Muscle weakness (generalized)  Other abnormalities of gait and mobility  Unsteadiness on feet     Problem List Patient Active Problem List   Diagnosis Date Noted   History of Lyme disease 06/22/2020   Pre-operative clearance 12/03/2018   High risk medication use  07/25/2018   Well woman exam without gynecological exam 11/21/2017   Left leg weakness 10/14/2015   Hypothyroidism (acquired) 11/03/2014   Other fatigue 11/03/2014   Gait disorder 11/03/2014   MULTIPLE SCLEROSIS 09/01/2008    Jones Bales, PT, DPT 07/16/2020, 11:09 AM  [facility] [address] [place], [place], [address] Phone: [phone]   Fax:  [phone]  Name: SHAVONN CONVEY MRN: [id number] Date of Birth: 12-Mar-1960

## 2020-07-19 ENCOUNTER — Other Ambulatory Visit: Payer: Self-pay

## 2020-07-19 ENCOUNTER — Ambulatory Visit: Payer: PRIVATE HEALTH INSURANCE

## 2020-07-19 DIAGNOSIS — M6281 Muscle weakness (generalized): Secondary | ICD-10-CM | POA: Diagnosis not present

## 2020-07-19 DIAGNOSIS — R2689 Other abnormalities of gait and mobility: Secondary | ICD-10-CM

## 2020-07-19 DIAGNOSIS — R2681 Unsteadiness on feet: Secondary | ICD-10-CM

## 2020-07-19 NOTE — Therapy (Signed)
[facility] [address] [place], [place], [address] Phone: [phone]   Fax:  [phone]  Physical Therapy Treatment  Patient Details  Name: Samantha Church MRN: [id number] Date of Birth: [birth year] Referring Provider (PT): Felecia Shelling, Nanine Means, MD   Encounter Date: 07/19/2020   PT End of Session - 07/19/20 0932     Visit Number 12    Number of Visits 17    Date for PT Re-Evaluation 08/09/20    Authorization Type [employer] 2022, 60 VL combined (PT, OT, ST)    PT Start Time 0932    PT Stop Time 1013    PT Time Calculation (min) 41 min    Equipment Utilized During Treatment Gait belt    Activity Tolerance Patient tolerated treatment well    Behavior During Therapy [facility] for tasks assessed/performed             Past Medical History:  Diagnosis Date   Anemia    Hypothyroidism    Dr. [name]   Lyme disease    Multiple sclerosis ([place])    Neuromuscular disorder ([place])    MS   Vision abnormalities     Past Surgical History:  Procedure Laterality Date   AUGMENTATION MAMMAPLASTY Bilateral 2003   saline   BREAST IMPLANT REMOVAL Bilateral 02/17/2019   BREAST SURGERY  2002   COLONOSCOPY  2008   DILATATION & CURRETTAGE/HYSTEROSCOPY WITH RESECTOCOPE N/A 10/09/2012   Procedure: DILATATION & CURETTAGE/HYSTEROSCOPY WITH RESECTOCOPE;  Surgeon: [name], MD;  Location: [facility];  Service: Gynecology;  Laterality: N/A;  RESECTION with Versapoint    fibroid  2019   fibroidectomy   KNEE ARTHROSCOPY  1996   KNEE ARTHROSCOPY  1996    There were no vitals filed for this visit.   Subjective Assessment - 07/19/20 0934     Subjective No new changes/complaints. Patient reports that she has been doing the new exercises. No pain to report. No falls.    Pertinent History relapsing MS (diagnosed 2007), hypothyroidism, knee arthroscopy, Lyme disease, status post Morton's neurectomy and Weil osteotomy of the second and third  metatarsal right foot (12/2019).    Limitations Walking    How long can you walk comfortably? 10 minutes    Patient Stated Goals wants to be able to walk without any assistance, improve confidence when walking    Currently in Pain? No/denies                               [facility] Adult PT Treatment/Exercise - 07/19/20 0001       Transfers   Transfers Sit to Stand;Stand to Sit    Sit to Stand 6: Modified independent (Device/Increase time)    Stand to Sit 6: Modified independent (Device/Increase time)      Ambulation/Gait   Ambulation/Gait Yes    Ambulation/Gait Assistance 5: Supervision    Ambulation/Gait Assistance Details throughout therapy session with activities, no walking stick utilized    Assistive device None    Gait Pattern Decreased stance time - left;Decreased weight shift to left;Trendelenburg;Decreased hip/knee flexion - left;Left circumduction    Ambulation Surface Level;Indoor      Neuro Re-ed    Neuro Re-ed Details  In half kneeling: completed alternating lift/chop x 10 reps with 1# weighted medicine ball, then transitioned to oppostite LE forward and completed second set x 10 reps. Then in tall kneeling without UE support completed hip hinges x 10  reps focused on slow and controlled movement. In tall kneeling with EO and no UE support,  PT providing manual perturbations in various lateral and A/P directions x 1 minute, then progressed second set to eyes closed x 1 minute. Increased challenge with Anterior perturbation, CGA from PT intermittently.      Knee/Hip Exercises: Aerobic   Other Aerobic Completed SciFit on Level 2.5 with BUE/BLE x 6 minutes for improved strengthening/endurance.                 Balance Exercises - 07/19/20 0001       Balance Exercises: Standing   Standing Eyes Closed Narrow base of support (BOS);Foam/compliant surface;3 reps;30 secs   on airex w/ EC   Stepping Strategy Anterior;Foam/compliant surface;Limitations     Stepping Strategy Limitations on red balance beam w/o UE support completed anterior stepping strategy x 15 reps bilaterally, intermittent challenge stepping back onto beam. CGA    Rockerboard Anterior/posterior;Lateral;Head turns;EO;Limitations    Rockerboard Limitations in A/P compelted horizontal/vertical head turns with eyes closed x 10 reps each direction, then progressed to board positioned in M/L direction and completd second set to each direction. Increased challenge with board M/L, CGA.    Sit to Stand Without upper extremity support;Foam/compliant surface;Limitations    Sit to Stand Limitations completed x 10 reps with BLE on airex, no UE support. Close supervision                 PT Short Term Goals - 07/15/20 1708       PT SHORT TERM GOAL #1   Title Pt will be independent with initial HEP in order to build upon functional gains made in therapy. ALL STGS DUE 07/11/20    Baseline independent with initial HEP, progressed on 7/7    Time 4    Period Weeks    Status Achieved    Target Date 07/11/20      PT SHORT TERM GOAL #2   Title Pt will undergo further assessment of 3MWT vs. 6MWT with single walking pole and LTG written as appropriate.    Baseline performed on 06/14/20 with 6MWT, ambulating 1039' with single walking pole    Time 4    Period Weeks    Status Achieved      PT SHORT TERM GOAL #3   Title Pt will ambulate at least 300' outdoors over unlevel surfaces with single walking pole and supervision in order to demo improved community mobility.    Baseline 400' outdoors unlevel surfaces (grass/pavement) w/ walking pole and supervision    Time 4    Period Weeks    Status Achieved      PT SHORT TERM GOAL #4   Title Pt will improve condition 4 on mCTSIB to at least 30 seconds in order to demo improved vestibular input for balance.    Baseline 25 seconds; 30 seconds on 07/07/20    Time 4    Period Weeks    Status Achieved      PT SHORT TERM GOAL #5   Title Pt will  improve DGI score to at least a 17/24 in order to demo decr fall risk.    Baseline 15/24; 18/24 on 07/07/20 with no AD    Time 4    Period Weeks    Status Achieved               PT Long Term Goals - 07/07/20 1015       PT LONG TERM GOAL #1  Title Pt will be independent with final HEP in order to build upon functional gains made in therapy. ALL LTGS DUE 08/08/20    Time 8    Period Weeks    Status New      PT LONG TERM GOAL #2   Title Pt will improve 6MWT distance by at least 100' with single walking pole and rating fatigue as a 2-3/10 or less in order to demo improved walking endurance.    Baseline 1039 with 4-5/10 fatigue rating.    Time 8    Period Weeks    Status Revised      PT LONG TERM GOAL #3   Title Pt will ambulate at least 115' over indoor level surfaces with no AD and supervision in order to demo improved household mobility.    Time 8    Period Weeks    Status New      PT LONG TERM GOAL #4   Title Pt will improve DGI score to at least a 21/24 in order to demo decr fall risk and FGA score to at least a 21/30 to decr fall risk.    Baseline DGI 18/24 on 07/07/20, FGA 17/30    Time 8    Period Weeks    Status Revised      PT LONG TERM GOAL #5   Title Perform gait speed with no AD with LTG to be written as appropriate.    Baseline 3.09 ft/sec with single walking pole.    Time 8    Period Weeks    Status New      PT LONG TERM GOAL #6   Title Pt will perform all conditions 2 and 3 of mCTSIB WNL and have preference score WNL in order to demo improved balance systems.    Baseline 2: WNL for one trial, below average all 3 trials  3: below average all 3 trials, preference = 80 (below norm)    Time 8    Period Weeks    Status New                   Plan - 07/19/20 1028     Clinical Impression Statement Continued focuse on proximal stability and strengthening with activities in tall kneeling and half kneeling, intermittent cues for control to promote  stability. Rest of session focused on continued balance strategies and balance on complaint surfaces with patient tolerating well. Will continue to progress toward all LTGs.    Personal Factors and Comorbidities Comorbidity 3+;Time since onset of injury/illness/exacerbation;Past/Current Experience    Comorbidities relapsing MS (diagnosed 2007), hypothyroidism, knee arthroscopy, Lyme disease, status post Morton's neurectomy and Weil osteotomy of the second and third metatarsal right foot (12/2019)    Examination-Activity Limitations Stairs;Transfers;Squat;Locomotion Level    Examination-Participation Restrictions Community Activity;Cleaning    Stability/Clinical Decision Making Stable/Uncomplicated    Rehab Potential Good    PT Frequency 2x / week    PT Duration 8 weeks    PT Treatment/Interventions ADLs/Self Care Home Management;Aquatic Therapy;Stair training;Gait training;DME Instruction;Functional mobility training;Therapeutic activities;Therapeutic exercise;Balance training;Neuromuscular re-education;Patient/family education;Orthotic Fit/Training;Passive range of motion;Vestibular    PT Next Visit Plan continue work on LLE proximal hip strengthening (hip ABD/ext/hip flexors), balance with vision removed, compliant surfaces, head motions. weight shifting activities to L, tandem gait and balance, gait with no AD.    Consulted and Agree with Plan of Care Patient             Patient will benefit from skilled therapeutic intervention  in order to improve the following deficits and impairments:  Abnormal gait, Decreased activity tolerance, Decreased coordination, Decreased balance, Decreased endurance, Decreased range of motion, Decreased strength, Dizziness, Impaired sensation  Visit Diagnosis: Muscle weakness (generalized)  Other abnormalities of gait and mobility  Unsteadiness on feet     Problem List Patient Active Problem List   Diagnosis Date Noted   History of Lyme disease  06/22/2020   Pre-operative clearance 12/03/2018   High risk medication use 07/25/2018   Well woman exam without gynecological exam 11/21/2017   Left leg weakness 10/14/2015   Hypothyroidism (acquired) 11/03/2014   Other fatigue 11/03/2014   Gait disorder 11/03/2014   MULTIPLE SCLEROSIS 09/01/2008    Jones Bales, PT, DPT 07/19/2020, 10:31 AM  [facility] [address] [place], [place], [address] Phone: [phone]   Fax:  [phone]  Name: Samantha Church MRN: [id number] Date of Birth: 18-Aug-1960

## 2020-07-23 ENCOUNTER — Ambulatory Visit: Payer: PRIVATE HEALTH INSURANCE | Admitting: Physical Therapy

## 2020-07-23 ENCOUNTER — Encounter: Payer: Self-pay | Admitting: Physical Therapy

## 2020-07-23 ENCOUNTER — Other Ambulatory Visit: Payer: Self-pay

## 2020-07-23 DIAGNOSIS — R2689 Other abnormalities of gait and mobility: Secondary | ICD-10-CM

## 2020-07-23 DIAGNOSIS — M6281 Muscle weakness (generalized): Secondary | ICD-10-CM

## 2020-07-23 DIAGNOSIS — R2681 Unsteadiness on feet: Secondary | ICD-10-CM

## 2020-07-23 DIAGNOSIS — R29818 Other symptoms and signs involving the nervous system: Secondary | ICD-10-CM

## 2020-07-23 NOTE — Therapy (Addendum)
[facility] [address] [place], [place], [address] Phone: [phone]   Fax:  [phone]  Physical Therapy Treatment  Patient Details  Name: Samantha Church MRN: [id number] Date of Birth: 1960/06/10 Referring Provider (PT): Felecia Shelling, Nanine Means, MD   Encounter Date: 07/23/2020   PT End of Session - 07/23/20 1015     Visit Number 13    Number of Visits 17    Date for PT Re-Evaluation 08/09/20    Authorization Type [employer] 2022, 60 VL combined (PT, OT, ST)    Authorization - Visit Number 13    Authorization - Number of Visits 78    PT Start Time 0933    PT Stop Time 1013    PT Time Calculation (min) 40 min    Equipment Utilized During Treatment Gait belt    Activity Tolerance Patient tolerated treatment well    Behavior During Therapy WFL for tasks assessed/performed             Past Medical History:  Diagnosis Date   Anemia    Hypothyroidism    Dr. [name]   Lyme disease    Multiple sclerosis ([place])    Neuromuscular disorder ([place])    MS   Vision abnormalities     Past Surgical History:  Procedure Laterality Date   AUGMENTATION MAMMAPLASTY Bilateral 2003   saline   BREAST IMPLANT REMOVAL Bilateral 02/17/2019   BREAST SURGERY  2002   COLONOSCOPY  2008   DILATATION & CURRETTAGE/HYSTEROSCOPY WITH RESECTOCOPE N/A 10/09/2012   Procedure: DILATATION & CURETTAGE/HYSTEROSCOPY WITH RESECTOCOPE;  Surgeon: [name], MD;  Location: [facility];  Service: Gynecology;  Laterality: N/A;  RESECTION with Versapoint    fibroid  2019   fibroidectomy   KNEE ARTHROSCOPY  1996   KNEE ARTHROSCOPY  1996    There were no vitals filed for this visit.   Subjective Assessment - 07/23/20 0935     Subjective Nothing new.    Pertinent History relapsing MS (diagnosed 2007), hypothyroidism, knee arthroscopy, Lyme disease, status post Morton's neurectomy and Weil osteotomy of the second and third metatarsal right foot  (12/2019).    Limitations Walking    How long can you walk comfortably? 10 minutes    Patient Stated Goals wants to be able to walk without any assistance, improve confidence when walking    Currently in Pain? No/denies                               [facility] Adult PT Treatment/Exercise - 07/23/20 0936       Ambulation/Gait   Ambulation/Gait Yes    Ambulation/Gait Assistance 5: Supervision    Ambulation/Gait Assistance Details plus additional distances with no AD throughout session    Ambulation Distance (Feet) 345 Feet    Assistive device None    Gait Pattern Decreased stance time - left;Decreased weight shift to left;Trendelenburg;Decreased hip/knee flexion - left;Left circumduction    Ambulation Surface Level;Indoor    Gait Comments tossing ball x1 lap then additional 2 laps while tossing ball and naming foods in alphabetical order, decr gait speed with cognitive task      Neuro Re-ed    Neuro Re-ed Details  NMR: pt reports half kneeling with lifts/chops at home have been a little bothersome on shoulder; on red mat on floor - performed half kneeling trunk rotations with arms extended x5 reps each side with also head turns (pt reporting  feeling better on shoulders with pt to perform at home) then x5 reps B holding 1# ball. tall kneeling on blue air ex eyes closed x10 reps head turns, x10 reps head nods. then with eyes open trying to lift feet off floor for hamstring activation and core strengthening 2 x 10 second holds in tall kneel position.      Knee/Hip Exercises: Aerobic   Other Aerobic Completed SciFit on Level 2.7 with BUE/BLE x 6 minutes for improved strengthening/endurance.      Knee/Hip Exercises: Prone   Other Prone Exercises in quadraped: hip extension kick backs, alternating legs x8 reps B, verbal/tactile cues to keep pelvis level, incr difficulty shifting weight to LLE                 Balance Exercises - 07/23/20 0001       Balance Exercises:  Standing   Standing Eyes Closed Foam/compliant surface    Standing Eyes Closed Limitations on rockerboard 3 x 30 seconds, needing to touch bars 1-2 times for balance   Rockerboard Anterior/posterior;Lateral;Head turns;EO;Limitations    Rockerboard Limitations toe taps with RLE to single cone and then forward/cross body cone taps with stance on LLE x10 reps each- pt needing UE support, with LLE as stance leg stepping RLE off and on x10 reps    Retro Gait 2 reps;Limitations    Retro Gait Limitations down and back x40' with good step length and hip extension with LLE                 PT Short Term Goals - 07/15/20 1708       PT SHORT TERM GOAL #1   Title Pt will be independent with initial HEP in order to build upon functional gains made in therapy. ALL STGS DUE 07/11/20    Baseline independent with initial HEP, progressed on 7/7    Time 4    Period Weeks    Status Achieved    Target Date 07/11/20      PT SHORT TERM GOAL #2   Title Pt will undergo further assessment of 3MWT vs. 6MWT with single walking pole and LTG written as appropriate.    Baseline performed on 06/14/20 with 6MWT, ambulating 1039' with single walking pole    Time 4    Period Weeks    Status Achieved      PT SHORT TERM GOAL #3   Title Pt will ambulate at least 300' outdoors over unlevel surfaces with single walking pole and supervision in order to demo improved community mobility.    Baseline 400' outdoors unlevel surfaces (grass/pavement) w/ walking pole and supervision    Time 4    Period Weeks    Status Achieved      PT SHORT TERM GOAL #4   Title Pt will improve condition 4 on mCTSIB to at least 30 seconds in order to demo improved vestibular input for balance.    Baseline 25 seconds; 30 seconds on 07/07/20    Time 4    Period Weeks    Status Achieved      PT SHORT TERM GOAL #5   Title Pt will improve DGI score to at least a 17/24 in order to demo decr fall risk.    Baseline 15/24; 18/24 on 07/07/20 with  no AD    Time 4    Period Weeks    Status Achieved               PT Long Term Goals -  07/07/20 1015       PT LONG TERM GOAL #1   Title Pt will be independent with final HEP in order to build upon functional gains made in therapy. ALL LTGS DUE 08/08/20    Time 8    Period Weeks    Status New      PT LONG TERM GOAL #2   Title Pt will improve 6MWT distance by at least 100' with single walking pole and rating fatigue as a 2-3/10 or less in order to demo improved walking endurance.    Baseline 1039 with 4-5/10 fatigue rating.    Time 8    Period Weeks    Status Revised      PT LONG TERM GOAL #3   Title Pt will ambulate at least 115' over indoor level surfaces with no AD and supervision in order to demo improved household mobility.    Time 8    Period Weeks    Status New      PT LONG TERM GOAL #4   Title Pt will improve DGI score to at least a 21/24 in order to demo decr fall risk and FGA score to at least a 21/30 to decr fall risk.    Baseline DGI 18/24 on 07/07/20, FGA 17/30    Time 8    Period Weeks    Status Revised      PT LONG TERM GOAL #5   Title Perform gait speed with no AD with LTG to be written as appropriate.    Baseline 3.09 ft/sec with single walking pole.    Time 8    Period Weeks    Status New      PT LONG TERM GOAL #6   Title Pt will perform all conditions 2 and 3 of mCTSIB WNL and have preference score WNL in order to demo improved balance systems.    Baseline 2: WNL for one trial, below average all 3 trials  3: below average all 3 trials, preference = 80 (below norm)    Time 8    Period Weeks    Status New                   Plan - 07/23/20 1206     Clinical Impression Statement Today's skilled session focused on BLE (LLE>RLE) strengthening and balance with SLS on LLE and dynamic gait activities. Updated HEP to include half kneel with arms extended and trunk rotations vs. lifts/chops due to it feeling better on pt's shoulders. Pt tolerated  session well. Will continue to progress towards LTGs.    Personal Factors and Comorbidities Comorbidity 3+;Time since onset of injury/illness/exacerbation;Past/Current Experience    Comorbidities relapsing MS (diagnosed 2007), hypothyroidism, knee arthroscopy, Lyme disease, status post Morton's neurectomy and Weil osteotomy of the second and third metatarsal right foot (12/2019)    Examination-Activity Limitations Stairs;Transfers;Squat;Locomotion Level    Examination-Participation Restrictions Community Activity;Cleaning    Stability/Clinical Decision Making Stable/Uncomplicated    Rehab Potential Good    PT Frequency 2x / week    PT Duration 8 weeks    PT Treatment/Interventions ADLs/Self Care Home Management;Aquatic Therapy;Stair training;Gait training;DME Instruction;Functional mobility training;Therapeutic activities;Therapeutic exercise;Balance training;Neuromuscular re-education;Patient/family education;Orthotic Fit/Training;Passive range of motion;Vestibular    PT Next Visit Plan continue work on LLE proximal hip strengthening (hip ABD/ext/hip flexors), balance with vision removed, compliant surfaces, head motions. weight shifting activities to L, tandem gait. dynamic gait with no AD.    Consulted and Agree with Plan of Care Patient  Patient will benefit from skilled therapeutic intervention in order to improve the following deficits and impairments:  Abnormal gait, Decreased activity tolerance, Decreased coordination, Decreased balance, Decreased endurance, Decreased range of motion, Decreased strength, Dizziness, Impaired sensation  Visit Diagnosis: Muscle weakness (generalized)  Other abnormalities of gait and mobility  Unsteadiness on feet  Other symptoms and signs involving the nervous system     Problem List Patient Active Problem List   Diagnosis Date Noted   History of Lyme disease 06/22/2020   Pre-operative clearance 12/03/2018   High risk medication  use 07/25/2018   Well woman exam without gynecological exam 11/21/2017   Left leg weakness 10/14/2015   Hypothyroidism (acquired) 11/03/2014   Other fatigue 11/03/2014   Gait disorder 11/03/2014   MULTIPLE SCLEROSIS 09/01/2008    Arliss Journey, PT, DPT  07/23/2020, 12:07 PM  [facility] [address] [place], [place], [address] Phone: [phone]   Fax:  [phone]  Name: Samantha Church MRN: [id number] Date of Birth: 1960/06/03

## 2020-07-26 ENCOUNTER — Other Ambulatory Visit: Payer: Self-pay

## 2020-07-26 ENCOUNTER — Ambulatory Visit: Payer: PRIVATE HEALTH INSURANCE | Admitting: Physical Therapy

## 2020-07-26 DIAGNOSIS — M6281 Muscle weakness (generalized): Secondary | ICD-10-CM

## 2020-07-26 DIAGNOSIS — R2681 Unsteadiness on feet: Secondary | ICD-10-CM

## 2020-07-26 DIAGNOSIS — R2689 Other abnormalities of gait and mobility: Secondary | ICD-10-CM

## 2020-07-26 NOTE — Therapy (Signed)
[facility] [address] [place], [place], [address] Phone: [phone]   Fax:  [phone]  Physical Therapy Treatment  Patient Details  Name: Samantha Church MRN: [id number] Date of Birth: Jun 23, 1960 Referring Provider (PT): Felecia Shelling, Nanine Means, MD   Encounter Date: 07/26/2020   PT End of Session - 07/26/20 1131     Visit Number 14    Number of Visits 17    Date for PT Re-Evaluation 08/09/20    Authorization Type [employer] 2022, 60 VL combined (PT, OT, ST)    Authorization - Visit Number 14    Authorization - Number of Visits 79    PT Start Time 0933    PT Stop Time 1014    PT Time Calculation (min) 41 min    Equipment Utilized During Treatment Gait belt    Activity Tolerance Patient tolerated treatment well    Behavior During Therapy WFL for tasks assessed/performed             Past Medical History:  Diagnosis Date   Anemia    Hypothyroidism    Dr. [name]   Lyme disease    Multiple sclerosis ([place])    Neuromuscular disorder ([place])    MS   Vision abnormalities     Past Surgical History:  Procedure Laterality Date   AUGMENTATION MAMMAPLASTY Bilateral 2003   saline   BREAST IMPLANT REMOVAL Bilateral 02/17/2019   BREAST SURGERY  2002   COLONOSCOPY  2008   DILATATION & CURRETTAGE/HYSTEROSCOPY WITH RESECTOCOPE N/A 10/09/2012   Procedure: DILATATION & CURETTAGE/HYSTEROSCOPY WITH RESECTOCOPE;  Surgeon: [name], MD;  Location: [facility];  Service: Gynecology;  Laterality: N/A;  RESECTION with Versapoint    fibroid  2019   fibroidectomy   KNEE ARTHROSCOPY  1996   KNEE ARTHROSCOPY  1996    There were no vitals filed for this visit.   Subjective Assessment - 07/26/20 1136     Subjective No changes since she was last here. Notices her balance is better when she has to close her eyes to wash her hair in the shower.    Pertinent History relapsing MS (diagnosed 2007), hypothyroidism, knee arthroscopy,  Lyme disease, status post Morton's neurectomy and Weil osteotomy of the second and third metatarsal right foot (12/2019).    Limitations Walking    How long can you walk comfortably? 10 minutes    Patient Stated Goals wants to be able to walk without any assistance, improve confidence when walking    Currently in Pain? No/denies                               [facility] Adult PT Treatment/Exercise - 07/26/20 1012       Ambulation/Gait   Ambulation/Gait Yes    Ambulation/Gait Assistance 5: Supervision    Ambulation/Gait Assistance Details between activities in session with no UE support    Assistive device None    Gait Pattern Decreased stance time - left;Decreased weight shift to left;Trendelenburg;Decreased hip/knee flexion - left;Left circumduction    Ambulation Surface Level;Indoor      Neuro Re-ed    Neuro Re-ed Details  In corner on blue air ex: with eyes closed: feet together 2 x 30 seconds, with feet hip width 2 x 10 reps head turns, 2 x 10 reps head nods - cues for weight shift to LLE. alternating marching 2 x 5 reps B with no UE support, cues for glute/core  activation and standing tall on LLE.  next to countertop: wide BOS weight shifting and lifting contralateral leg for dynamic SLS (kicking behind) x10 reps B then adding in UE reach x5 reps B. on blue air ex: repeated wide BOS weight shift with lifting up contralateral leg for SLS x6 reps B.      Exercises   Exercises Other Exercises    Other Exercises  Next to countertop with yellow tband around thighs: side stepping down and back in mini squat x3 reps, retro gait in mini squat position down and back 3 reps.(Pt doing well with equal step length).                Balance Exercises - 07/26/20 1008       Balance Exercises: Standing   Tandem Gait Forward;Foam/compliant surface;4 reps    Tandem Gait Limitations down and back on blue foam beam x4 reps, UE support with brief instances of no UE support    Sit  to Stand Without upper extremity support;Foam/compliant surface;Limitations    Sit to Stand Limitations x7 reps on blue foam beam without UE support, verbal and tactile cues for weight shift to L                 PT Short Term Goals - 07/15/20 1708       PT SHORT TERM GOAL #1   Title Pt will be independent with initial HEP in order to build upon functional gains made in therapy. ALL STGS DUE 07/11/20    Baseline independent with initial HEP, progressed on 7/7    Time 4    Period Weeks    Status Achieved    Target Date 07/11/20      PT SHORT TERM GOAL #2   Title Pt will undergo further assessment of 3MWT vs. 6MWT with single walking pole and LTG written as appropriate.    Baseline performed on 06/14/20 with 6MWT, ambulating 1039' with single walking pole    Time 4    Period Weeks    Status Achieved      PT SHORT TERM GOAL #3   Title Pt will ambulate at least 300' outdoors over unlevel surfaces with single walking pole and supervision in order to demo improved community mobility.    Baseline 400' outdoors unlevel surfaces (grass/pavement) w/ walking pole and supervision    Time 4    Period Weeks    Status Achieved      PT SHORT TERM GOAL #4   Title Pt will improve condition 4 on mCTSIB to at least 30 seconds in order to demo improved vestibular input for balance.    Baseline 25 seconds; 30 seconds on 07/07/20    Time 4    Period Weeks    Status Achieved      PT SHORT TERM GOAL #5   Title Pt will improve DGI score to at least a 17/24 in order to demo decr fall risk.    Baseline 15/24; 18/24 on 07/07/20 with no AD    Time 4    Period Weeks    Status Achieved               PT Long Term Goals - 07/07/20 1015       PT LONG TERM GOAL #1   Title Pt will be independent with final HEP in order to build upon functional gains made in therapy. ALL LTGS DUE 08/08/20    Time 8    Period Weeks  Status New      PT LONG TERM GOAL #2   Title Pt will improve 6MWT distance by  at least 100' with single walking pole and rating fatigue as a 2-3/10 or less in order to demo improved walking endurance.    Baseline 1039 with 4-5/10 fatigue rating.    Time 8    Period Weeks    Status Revised      PT LONG TERM GOAL #3   Title Pt will ambulate at least 115' over indoor level surfaces with no AD and supervision in order to demo improved household mobility.    Time 8    Period Weeks    Status New      PT LONG TERM GOAL #4   Title Pt will improve DGI score to at least a 21/24 in order to demo decr fall risk and FGA score to at least a 21/30 to decr fall risk.    Baseline DGI 18/24 on 07/07/20, FGA 17/30    Time 8    Period Weeks    Status Revised      PT LONG TERM GOAL #5   Title Perform gait speed with no AD with LTG to be written as appropriate.    Baseline 3.09 ft/sec with single walking pole.    Time 8    Period Weeks    Status New      PT LONG TERM GOAL #6   Title Pt will perform all conditions 2 and 3 of mCTSIB WNL and have preference score WNL in order to demo improved balance systems.    Baseline 2: WNL for one trial, below average all 3 trials  3: below average all 3 trials, preference = 80 (below norm)    Time 8    Period Weeks    Status New                   Plan - 07/26/20 1139     Clinical Impression Statement Today's session continued to focus on balance on compliant surfaces with vision removed and SLS tasks and BLE strengthening. Pt tolerated well, still with difficulty with head motions when vision is removed. Needs cues for glute/core activation with SLS tasks on LLE for incr stability. Will continue to progress towards LTGs.    Personal Factors and Comorbidities Comorbidity 3+;Time since onset of injury/illness/exacerbation;Past/Current Experience    Comorbidities relapsing MS (diagnosed 2007), hypothyroidism, knee arthroscopy, Lyme disease, status post Morton's neurectomy and Weil osteotomy of the second and third metatarsal right  foot (12/2019)    Examination-Activity Limitations Stairs;Transfers;Squat;Locomotion Level    Examination-Participation Restrictions Community Activity;Cleaning    Stability/Clinical Decision Making Stable/Uncomplicated    Rehab Potential Good    PT Frequency 2x / week    PT Duration 8 weeks    PT Treatment/Interventions ADLs/Self Care Home Management;Aquatic Therapy;Stair training;Gait training;DME Instruction;Functional mobility training;Therapeutic activities;Therapeutic exercise;Balance training;Neuromuscular re-education;Patient/family education;Orthotic Fit/Training;Passive range of motion;Vestibular    PT Next Visit Plan continue work on LLE proximal hip strengthening (hip ABD/ext/hip flexors), balance with vision removed and head motions, compliant surfaces, LLE SLS/weight shifting. dynamic gait with no AD. LTGs due next week.    Consulted and Agree with Plan of Care Patient             Patient will benefit from skilled therapeutic intervention in order to improve the following deficits and impairments:  Abnormal gait, Decreased activity tolerance, Decreased coordination, Decreased balance, Decreased endurance, Decreased range of motion, Decreased strength, Dizziness, Impaired  sensation  Visit Diagnosis: Muscle weakness (generalized)  Other abnormalities of gait and mobility  Unsteadiness on feet     Problem List Patient Active Problem List   Diagnosis Date Noted   History of Lyme disease 06/22/2020   Pre-operative clearance 12/03/2018   High risk medication use 07/25/2018   Well woman exam without gynecological exam 11/21/2017   Left leg weakness 10/14/2015   Hypothyroidism (acquired) 11/03/2014   Other fatigue 11/03/2014   Gait disorder 11/03/2014   MULTIPLE SCLEROSIS 09/01/2008    Arliss Journey, PT, DPT  07/26/2020, 11:42 AM  [facility] [address] [place], [place], [address] Phone:  [phone]   Fax:  [phone]  Name: Samantha Church MRN: [id number] Date of Birth: [birth year]

## 2020-07-27 ENCOUNTER — Ambulatory Visit (INDEPENDENT_AMBULATORY_CARE_PROVIDER_SITE_OTHER): Payer: PRIVATE HEALTH INSURANCE | Admitting: Family Medicine

## 2020-07-27 ENCOUNTER — Encounter: Payer: Self-pay | Admitting: Family Medicine

## 2020-07-27 VITALS — BP 119/74 | HR 61 | Ht 66.0 in | Wt 148.0 lb

## 2020-07-27 DIAGNOSIS — R29898 Other symptoms and signs involving the musculoskeletal system: Secondary | ICD-10-CM

## 2020-07-27 DIAGNOSIS — R5383 Other fatigue: Secondary | ICD-10-CM

## 2020-07-27 DIAGNOSIS — R269 Unspecified abnormalities of gait and mobility: Secondary | ICD-10-CM

## 2020-07-27 DIAGNOSIS — Z79899 Other long term (current) drug therapy: Secondary | ICD-10-CM

## 2020-07-27 DIAGNOSIS — E559 Vitamin D deficiency, unspecified: Secondary | ICD-10-CM | POA: Diagnosis not present

## 2020-07-27 DIAGNOSIS — G35 Multiple sclerosis: Secondary | ICD-10-CM

## 2020-07-27 NOTE — Patient Instructions (Signed)
Below is our plan:  We will continue current treatment plan. I will update your blood work, today. Continue working with PT for muscle strengthening and gait training.   Please make sure you are staying well hydrated. I recommend 50-60 ounces daily. Well balanced diet and regular exercise encouraged. Consistent sleep schedule with 6-8 hours recommended.   Please continue follow up with care team as directed.   Follow up with Dr Felecia Shelling in 6 months   You may receive a survey regarding today's visit. I encourage you to leave honest feed back as I do use this information to improve patient care. Thank you for seeing me today!

## 2020-07-27 NOTE — Progress Notes (Addendum)
Chief Complaint  Patient presents with   Follow-up    Pt alone, new rm. Following up. Pt takes tecfidera. No concerns.      HISTORY OF PRESENT ILLNESS: 07/27/20 ALL:  Samantha Church is a 60 y.o. female here today for follow up for RRMS. She continue dimethyl fumerate. Labs were normal 01/2020. MRI 03/2019 stable.   She is doing well, today. She feels that gait is improving with PT. She continues to have left lower ext weakness that worsens when she is tired. She is walking 10-15 minutes and trying to increase her time. She denies falls. She uses a walking stick.   She is working part time. She is sleeping well. She usually sleeps from 10pm to 5am and then takes a 2 hour nap after working in the mornings. Mood is good. No new or exacerbating symptoms. She continues vitamin D 5,000iu daily.    HISTORY (copied from Dr Garth Bigness previous note)  Samantha Church is a 60 year old woman with relapsing remitting multiple sclerosis diagnosed in 2007.     Update 01/28/2020 She is on Tecfidera (DMF) as her DMT.   She has tolerated it well.       She is walking worse, mostly due to the boot/right foot issues and left foot weakness.   Balance is mildly off.    She has had 2 falls, when her left foot caughte something and she stimbled down.  The  left leg has weakness, spasticity and reduced coordination.   Her left hand is slightly weak but coordinated.    Bladder function is the same with urgency but no incontinence.  Vision is doing well.      She has a lot of fatigue and has sleepiness.   She sleeps 2 hours most afternoons.     She sleeps ok some nights but other nights sleep seems less restorative and she is up some.  She sweats a lot at night.   Mood is doing about the same.     She had recent right foot surgery.    She had the joint revised and a Morton Neuroma excised.  .She is wearing a boot.     MS History:    She was diagnosed in 2007 after presenting with right sided numbness and mild clumsiness  / allodynia.    MRI was consistent with MS and she was started with Betaseron.  She then switched to Copaxone but had exacerbations and switched to Tysabri.     She converted to Tysabri but became JCV Ab positive and switched to Gilenya.   Due to exacerbations on Gilenya, she switched back to Tysabri x one year and then switched to Tecfidera (in 2014).   She switched to generic DMF in 2020.      IMAGING MRI brain 04/08/2019 shows multiple T2/FLAIR hyperintense foci in the hemispheres, brainstem and cerebellum in a pattern configuration consistent with chronic demyelinating plaque associated with multiple sclerosis.  None of the foci appears to be acute and they do not enhance.  Compared to the MRI dated 12/24/2015, there are no new lesions.   MRI cervical spine 04/08/2019 shows T2 hyperintense foci within the spinal cord anteriorly to the right adjacent to C2, anterolaterally to the right adjacent to C3, laterally to the left adjacent to C3-C4 to C4, laterally to the left at C5, posterolaterally to the right at C6, and to the left at T1..   When compared to the MRI from 04/26/2016, there are no new  lesions.  None of the foci enhance.  Mild degenerative disc changes and spondylosis at C3-C4, C4-C5 and C5-C6.  There is no nerve root compression or spinal stenosis.  This appears fairly stable compared to the previous MRI.   MRI thoracic spine 04/08/2019 shows foci within the spinal cord at T8-T9 and T12 that were also present on the 06/02/2015 MRI.  These are consistent with demyelinating plaque associated with multiple sclerosis.    Mild degenerative changes at T7-T8 and T10-T11 that do not lead to nerve root compression   REVIEW OF SYSTEMS: Out of a complete 14 system review of symptoms, the patient complains only of the following symptoms, right lower ext weakness, imbalance, and all other reviewed systems are negative.   ALLERGIES: No Known Allergies   HOME MEDICATIONS: Outpatient Medications Prior to  Visit  Medication Sig Dispense Refill   Alpha-Lipoic Acid 600 MG TABS      Ascorbic Acid 500 MG/15ML SYRP Take by mouth.     Biotin 100 MG/GM POWD      Cholecalciferol (VITAMIN D-3) 5000 units TABS Take 1 tablet by mouth daily.     Dimethyl Fumarate (TECFIDERA) 240 MG CPDR Take 1 capsule (240 mg total) by mouth 2 (two) times daily. 180 capsule 3   Magnesium (OPTIMAG 125) 125 MG CAPS Take 2 capsules by mouth daily.     Methylcobalamin 1 MG CHEW      Multiple Vitamins-Minerals (MULTIVITAMIN ADULT PO) Take by mouth.     NP THYROID 60 MG tablet TAKE ONE TABLET BY MOUTH DAILY BEFORE BREAKFAST 90 tablet 3   Nutritional Supplements (OPTICLEANSE GHI PO) Take by mouth.     Turmeric POWD by Does not apply route.     gadopentetate dimeglumine (MAGNEVIST) injection 13 mL      gadopentetate dimeglumine (MAGNEVIST) injection 13 mL      gadopentetate dimeglumine (MAGNEVIST) injection 13 mL      No facility-administered medications prior to visit.     PAST MEDICAL HISTORY: Past Medical History:  Diagnosis Date   Anemia    Hypothyroidism    Dr. [name]   Lyme disease    Multiple sclerosis ([place])    Neuromuscular disorder ([place])    MS   Vision abnormalities      PAST SURGICAL HISTORY: Past Surgical History:  Procedure Laterality Date   AUGMENTATION MAMMAPLASTY Bilateral 2003   saline   BREAST IMPLANT REMOVAL Bilateral 02/17/2019   BREAST SURGERY  2002   COLONOSCOPY  2008   DILATATION & CURRETTAGE/HYSTEROSCOPY WITH RESECTOCOPE N/A 10/09/2012   Procedure: DILATATION & CURETTAGE/HYSTEROSCOPY WITH RESECTOCOPE;  Surgeon: [name], MD;  Location: [facility];  Service: Gynecology;  Laterality: N/A;  RESECTION with Versapoint    fibroid  2019   fibroidectomy   KNEE ARTHROSCOPY  1996   KNEE ARTHROSCOPY  1996     FAMILY HISTORY: Family History  Problem Relation Age of Onset   Hypertension Mother    Arthritis Mother    Colon cancer Father    Thyroid cancer Sister    Dementia Brother         Early   Heart attack Maternal Grandmother    Hypertension Maternal Grandmother    CAD Maternal Grandmother    Diabetes Maternal Grandfather    Lung cancer Maternal Grandfather    Alzheimer's disease Paternal Grandmother    Hypertension Paternal Grandmother    Hyperlipidemia Paternal Grandmother    Diabetes Paternal Grandfather    Asthma Brother    Thyroid cancer Brother  SOCIAL HISTORY: Social History   Socioeconomic History   Marital status: Married    Spouse name: Not on file   Number of children: Not on file   Years of education: Not on file   Highest education level: Not on file  Occupational History   Not on file  Tobacco Use   Smoking status: Never   Smokeless tobacco: Never  Vaping Use   Vaping Use: Never used  Substance and Sexual Activity   Alcohol use: Yes    Comment: 2x month   Drug use: No   Sexual activity: Yes  Other Topics Concern   Not on file  Social History Narrative   Not on file   Social Determinants of Health   Financial Resource Strain: Not on file  Food Insecurity: Not on file  Transportation Needs: Not on file  Physical Activity: Not on file  Stress: Not on file  Social Connections: Not on file  Intimate Partner Violence: Not on file     PHYSICAL EXAM  Vitals:   07/27/20 0943  BP: 119/74  Pulse: 61  Weight: 148 lb (67.1 kg)  Height: 5\' 6"  (1.676 m)   Body mass index is 23.89 kg/m.   Generalized: Well developed, in no acute distress  Cardiology: normal rate and rhythm, no murmur auscultated  Respiratory: clear to auscultation bilaterally    Neurological examination  Mentation: Alert oriented to time, place, history taking. Follows all commands speech and language fluent Cranial nerve II-XII: Pupils were equal round reactive to light. Extraocular movements were full, visual field were full on confrontational test. Facial sensation and strength were normal. Head turning and shoulder shrug  were normal and  symmetric. Motor: The motor testing reveals 5 over 5 strength of all 4 extremities with exception of 4/5 left hip flexion.  Good symmetric motor tone is noted throughout.  Sensory: Sensory testing is intact to soft touch on all 4 extremities. No evidence of extinction is noted.  Coordination: Cerebellar testing reveals good finger-nose-finger and heel-to-shin bilaterally.  Gait and station: Gait is wide but stable, 2-3 step turns, uses walking stick.  Reflexes: Deep tendon reflexes are symmetric and normal bilaterally.    DIAGNOSTIC DATA (LABS, IMAGING, TESTING) - I reviewed patient records, labs, notes, testing and imaging myself where available.  Lab Results  Component Value Date   WBC 5.0 01/28/2020   HGB 13.2 01/28/2020   HCT 39.8 01/28/2020   MCV 93 01/28/2020   PLT 302 01/28/2020      Component Value Date/Time   NA 144 03/31/2019 1617   K 4.1 03/31/2019 1617   CL 105 03/31/2019 1617   CO2 25 03/31/2019 1617   GLUCOSE 92 03/31/2019 1617   GLUCOSE 96 12/03/2018 1204   BUN 8 03/31/2019 1617   CREATININE 0.55 (L) 03/31/2019 1617   CALCIUM 8.7 03/31/2019 1617   PROT 7.0 01/28/2020 0908   ALBUMIN 4.8 01/28/2020 0908   AST 17 01/28/2020 0908   ALT 15 01/28/2020 0908   ALKPHOS 54 01/28/2020 0908   BILITOT <0.2 01/28/2020 0908   GFRNONAA 104 03/31/2019 1617   GFRAA 120 03/31/2019 1617   Lab Results  Component Value Date   CHOL 216 (H) 12/03/2018   HDL 74.50 12/03/2018   LDLCALC 127 (H) 12/03/2018   TRIG 71.0 12/03/2018   CHOLHDL 3 12/03/2018   Lab Results  Component Value Date   HGBA1C 5.0 06/27/2016   Lab Results  Component Value Date   VITAMINB12 >2000 (H) 05/14/2015  Lab Results  Component Value Date   TSH 1.35 04/15/2020    No flowsheet data found.   No flowsheet data found.   ASSESSMENT AND PLAN  60 y.o. year old female  has a past medical history of Anemia, Hypothyroidism, Lyme disease, Multiple sclerosis ([place]), Neuromuscular disorder ([place]), and  Vision abnormalities. here with    Relapsing remitting multiple sclerosis ([place]) - Plan: CBC with Differential/Platelets, Hepatic Function Panel  High risk medication use - Plan: CBC with Differential/Platelets, Hepatic Function Panel  Vitamin D deficiency - Plan: Vitamin D, 25-hydroxy  Left leg weakness  Other fatigue  Gait disorder   Imaan is doing well, today. She will continue generic Tecfidera as prescribed. MRI stable 03/2019. We will update labs, today. She will continue vitamin D 5,000iu daily. She will continue working with PT as directed. She will follow up with Dr Felecia Shelling in 6 months, sooner if needed.    Orders Placed This Encounter  Procedures   CBC with Differential/Platelets   Hepatic Function Panel   Vitamin D, 25-hydroxy      No orders of the defined types were placed in this encounter.     Debbora Presto, MSN, FNP-C 07/27/2020, 10:16 AM  [facility] [address], [place], [place] [address] [phone]   Richard A. Felecia Shelling, MD, PhD, FAAN Certified in Neurology, Clinical Neurophysiology, Sleep Medicine, Pain Medicine and Neuroimaging Director, [facility] [address], [place], [place] [address] [phone]

## 2020-07-28 ENCOUNTER — Ambulatory Visit: Payer: PRIVATE HEALTH INSURANCE

## 2020-07-28 LAB — CBC WITH DIFFERENTIAL/PLATELET
Basophils Absolute: 0 10*3/uL (ref 0.0–0.2)
Basos: 0 %
EOS (ABSOLUTE): 0 10*3/uL (ref 0.0–0.4)
Eos: 1 %
Hematocrit: 39.1 % (ref 34.0–46.6)
Hemoglobin: 13 g/dL (ref 11.1–15.9)
Immature Grans (Abs): 0 10*3/uL (ref 0.0–0.1)
Immature Granulocytes: 0 %
Lymphocytes Absolute: 2 10*3/uL (ref 0.7–3.1)
Lymphs: 26 %
MCH: 30.4 pg (ref 26.6–33.0)
MCHC: 33.2 g/dL (ref 31.5–35.7)
MCV: 92 fL (ref 79–97)
Monocytes Absolute: 0.6 10*3/uL (ref 0.1–0.9)
Monocytes: 8 %
Neutrophils Absolute: 4.8 10*3/uL (ref 1.4–7.0)
Neutrophils: 65 %
Platelets: 283 10*3/uL (ref 150–450)
RBC: 4.27 x10E6/uL (ref 3.77–5.28)
RDW: 13.1 % (ref 11.7–15.4)
WBC: 7.4 10*3/uL (ref 3.4–10.8)

## 2020-07-28 LAB — HEPATIC FUNCTION PANEL
ALT: 13 IU/L (ref 0–32)
AST: 15 IU/L (ref 0–40)
Albumin: 4.9 g/dL (ref 3.8–4.9)
Alkaline Phosphatase: 60 IU/L (ref 44–121)
Bilirubin Total: 0.3 mg/dL (ref 0.0–1.2)
Bilirubin, Direct: <0.1 mg/dL (ref 0.00–0.40)
Total Protein: 7.2 g/dL (ref 6.0–8.5)

## 2020-07-28 LAB — VITAMIN D 25 HYDROXY (VIT D DEFICIENCY, FRACTURES): Vit D, 25-Hydroxy: 90 ng/mL (ref 30.0–100.0)

## 2020-07-30 ENCOUNTER — Ambulatory Visit: Payer: PRIVATE HEALTH INSURANCE | Admitting: Physical Therapy

## 2020-08-02 ENCOUNTER — Ambulatory Visit: Payer: PRIVATE HEALTH INSURANCE | Admitting: Physical Therapy

## 2020-08-02 ENCOUNTER — Other Ambulatory Visit: Payer: Self-pay

## 2020-08-02 DIAGNOSIS — R2681 Unsteadiness on feet: Secondary | ICD-10-CM

## 2020-08-02 DIAGNOSIS — M6281 Muscle weakness (generalized): Secondary | ICD-10-CM

## 2020-08-02 DIAGNOSIS — R2689 Other abnormalities of gait and mobility: Secondary | ICD-10-CM

## 2020-08-02 NOTE — Therapy (Addendum)
[facility] [address] [place], [place], [address] Phone: [phone]   Fax:  [phone]  Physical Therapy Treatment  Patient Details  Name: Samantha Church MRN: [id number] Date of Birth: September 15, 1960 Referring Provider (PT): Felecia Shelling, Nanine Means, MD   Encounter Date: 08/02/2020   PT End of Session - 08/02/20 1014     Visit Number 15    Number of Visits 17    Date for PT Re-Evaluation 08/09/20    Authorization Type [employer] 2022, 60 VL combined (PT, OT, ST)    Authorization - Visit Number 15    Authorization - Number of Visits 5    PT Start Time 0933    PT Stop Time 1013    PT Time Calculation (min) 40 min    Equipment Utilized During Treatment Gait belt    Activity Tolerance Patient tolerated treatment well    Behavior During Therapy WFL for tasks assessed/performed             Past Medical History:  Diagnosis Date   Anemia    Hypothyroidism    Dr. [name]   Lyme disease    Multiple sclerosis ([place])    Neuromuscular disorder ([place])    MS   Vision abnormalities     Past Surgical History:  Procedure Laterality Date   AUGMENTATION MAMMAPLASTY Bilateral 2003   saline   BREAST IMPLANT REMOVAL Bilateral 02/17/2019   BREAST SURGERY  2002   COLONOSCOPY  2008   DILATATION & CURRETTAGE/HYSTEROSCOPY WITH RESECTOCOPE N/A 10/09/2012   Procedure: DILATATION & CURETTAGE/HYSTEROSCOPY WITH RESECTOCOPE;  Surgeon: [name], MD;  Location: [facility];  Service: Gynecology;  Laterality: N/A;  RESECTION with Versapoint    fibroid  2019   fibroidectomy   KNEE ARTHROSCOPY  1996   KNEE ARTHROSCOPY  1996    There were no vitals filed for this visit.   Subjective Assessment - 08/02/20 0936     Subjective Has a treadmill at home that she could potentially use for her walking program. No other changes.    Pertinent History relapsing MS (diagnosed 2007), hypothyroidism, knee arthroscopy, Lyme disease, status post Morton's  neurectomy and Weil osteotomy of the second and third metatarsal right foot (12/2019).    Limitations Walking    How long can you walk comfortably? 10 minutes    Patient Stated Goals wants to be able to walk without any assistance, improve confidence when walking    Currently in Pain? No/denies                [facility] PT Assessment - 08/02/20 0940       6 Minute Walk- Baseline   6 Minute Walk- Baseline yes    HR (bpm) 58    02 Sat (%RA) 100 %    Modified Borg Scale for Dyspnea 0- Nothing at all      6 Minute walk- Post Test   6 Minute Walk Post Test yes    HR (bpm) 74    02 Sat (%RA) 98 %    Modified Borg Scale for Dyspnea 2- Mild shortness of breath    Perceived Rate of Exertion (Borg) 11- Fairly light      6 minute walk test results    Aerobic Endurance Distance Walked 1264    Endurance additional comments rates 3/10 fatigue      Dynamic Gait Index   Level Surface Mild Impairment    Change in Gait Speed Normal    Gait with Horizontal  Head Turns Normal    Gait with Vertical Head Turns Mild Impairment    Gait and Pivot Turn Normal    Step Over Obstacle Normal    Step Around Obstacles Normal    Steps Normal    Total Score 22    DGI comment: 22/24      Functional Gait  Assessment   Gait assessed  Yes    Gait Level Surface Walks 20 ft in less than 7 sec but greater than 5.5 sec, uses assistive device, slower speed, mild gait deviations, or deviates 6-10 in outside of the 12 in walkway width.   6.35   Change in Gait Speed Able to smoothly change walking speed without loss of balance or gait deviation. Deviate no more than 6 in outside of the 12 in walkway width.    Gait with Horizontal Head Turns Performs head turns smoothly with no change in gait. Deviates no more than 6 in outside 12 in walkway width    Gait with Vertical Head Turns Performs task with slight change in gait velocity (eg, minor disruption to smooth gait path), deviates 6 - 10 in outside 12 in walkway width  or uses assistive device    Gait and Pivot Turn Pivot turns safely within 3 sec and stops quickly with no loss of balance.    Step Over Obstacle Is able to step over 2 stacked shoe boxes taped together (9 in total height) without changing gait speed. No evidence of imbalance.    Gait with Narrow Base of Support Ambulates less than 4 steps heel to toe or cannot perform without assistance.    Gait with Eyes Closed Walks 20 ft, slow speed, abnormal gait pattern, evidence for imbalance, deviates 10-15 in outside 12 in walkway width. Requires more than 9 sec to ambulate 20 ft.   9.34   Ambulating Backwards Walks 20 ft, uses assistive device, slower speed, mild gait deviations, deviates 6-10 in outside 12 in walkway width.   14.06   Steps Alternating feet, no rail.    Total Score 22    FGA comment: 22/30 = medium fall risk                           [facility] Adult PT Treatment/Exercise - 08/02/20 1004       Ambulation/Gait   Ambulation/Gait Yes    Ambulation/Gait Assistance 5: Supervision    Ambulation/Gait Assistance Details between activities with no AD and throughout session. pt has been ambulating around the house short distances with no AD, educated to continue using walking stick for longer distances and for outdoor gait due to fatigue    Ambulation Distance (Feet) 115 Feet    Assistive device None    Gait Pattern Decreased stance time - left;Decreased weight shift to left;Trendelenburg;Decreased hip/knee flexion - left;Left circumduction    Ambulation Surface Level;Indoor    Gait velocity 9.4 = 3.48 ft/sec      Therapeutic Activites    Therapeutic Activities Other Therapeutic Activities    Other Therapeutic Activities pt asking about tips/exercises that she can do for urinary urgency - discussed that pt would benefit from a referral for a pelvic health PT in regards to this and discussed pt will need a new order from her physician. discussed will look up pelvic health  therapists in the area                    PT Education -  08/02/20 1013     Education Details progress towards goals, POC going forwards, see TA.    Person(s) Educated Patient    Methods Explanation    Comprehension Verbalized understanding              PT Short Term Goals - 07/15/20 1708       PT SHORT TERM GOAL #1   Title Pt will be independent with initial HEP in order to build upon functional gains made in therapy. ALL STGS DUE 07/11/20    Baseline independent with initial HEP, progressed on 7/7    Time 4    Period Weeks    Status Achieved    Target Date 07/11/20      PT SHORT TERM GOAL #2   Title Pt will undergo further assessment of 3MWT vs. 6MWT with single walking pole and LTG written as appropriate.    Baseline performed on 06/14/20 with 6MWT, ambulating 1039' with single walking pole    Time 4    Period Weeks    Status Achieved      PT SHORT TERM GOAL #3   Title Pt will ambulate at least 300' outdoors over unlevel surfaces with single walking pole and supervision in order to demo improved community mobility.    Baseline 400' outdoors unlevel surfaces (grass/pavement) w/ walking pole and supervision    Time 4    Period Weeks    Status Achieved      PT SHORT TERM GOAL #4   Title Pt will improve condition 4 on mCTSIB to at least 30 seconds in order to demo improved vestibular input for balance.    Baseline 25 seconds; 30 seconds on 07/07/20    Time 4    Period Weeks    Status Achieved      PT SHORT TERM GOAL #5   Title Pt will improve DGI score to at least a 17/24 in order to demo decr fall risk.    Baseline 15/24; 18/24 on 07/07/20 with no AD    Time 4    Period Weeks    Status Achieved               PT Long Term Goals - 08/02/20 0950       PT LONG TERM GOAL #1   Title Pt will be independent with final HEP in order to build upon functional gains made in therapy. ALL LTGS DUE 08/08/20    Time 8    Period Weeks    Status New      PT LONG  TERM GOAL #2   Title Pt will improve 6MWT distance by at least 100' with single walking pole and rating fatigue as a 2-3/10 or less in order to demo improved walking endurance.    Baseline 1039 with 4-5/10 fatigue rating; 1264' on 08/02/20 with 3/10 fatigue rating    Time 8    Period Weeks    Status Achieved      PT LONG TERM GOAL #3   Title Pt will ambulate at least 115' over indoor level surfaces with no AD and supervision in order to demo improved household mobility.    Baseline met with no AD on 08/02/20    Time 8    Period Weeks    Status Achieved      PT LONG TERM GOAL #4   Title Pt will improve DGI score to at least a 21/24 in order to demo decr fall risk and FGA score to at  least a 21/30 to decr fall risk.    Baseline DGI 18/24 on 07/07/20, FGA 17/30; on 7/25/22l DGI 22/24 and FGA score 22/30    Time 8    Period Weeks    Status Achieved      PT LONG TERM GOAL #5   Title Perform gait speed with no AD with LTG to be written as appropriate.    Baseline 3.09 ft/sec with single walking pole; 9.4 = 3.48 ft/sec with no AD    Time 8    Period Weeks    Status Achieved      PT LONG TERM GOAL #6   Title Pt will perform all conditions 2 and 3 of mCTSIB WNL and have preference score WNL in order to demo improved balance systems.    Baseline 2: WNL for one trial, below average all 3 trials  3: below average all 3 trials, preference = 80 (below norm)    Time 8    Period Weeks    Status New                   Plan - 08/02/20 1201     Clinical Impression Statement Today's skilled session focused on assessing pt's LTGs for re-cert at next session. Pt has met LTGs #2-5. Pt improved FGA score to a 22/30 (moderate fall risk) and was previously a 17/30 (high fall risk). Pt improved gait speed with no AD to 3.48 ft/sec (previously 3.09 ft/sec with walking stick). Pt demonstrated improved gait endurance and efficiency by incr 6MWT distance to 1,264' with walking stick and was previously  1,039'. Pt is making excellent progress, will assess remaining LTGs at next session.    Personal Factors and Comorbidities Comorbidity 3+;Time since onset of injury/illness/exacerbation;Past/Current Experience    Comorbidities relapsing MS (diagnosed 2007), hypothyroidism, knee arthroscopy, Lyme disease, status post Morton's neurectomy and Weil osteotomy of the second and third metatarsal right foot (12/2019)    Examination-Activity Limitations Stairs;Transfers;Squat;Locomotion Level    Examination-Participation Restrictions Community Activity;Cleaning    Stability/Clinical Decision Making Stable/Uncomplicated    Rehab Potential Good    PT Frequency 2x / week    PT Duration 8 weeks    PT Treatment/Interventions ADLs/Self Care Home Management;Aquatic Therapy;Stair training;Gait training;DME Instruction;Functional mobility training;Therapeutic activities;Therapeutic exercise;Balance training;Neuromuscular re-education;Patient/family education;Orthotic Fit/Training;Passive range of motion;Vestibular    PT Next Visit Plan finish checking LTGs and re-cert. trial treadmill to see if pt can use for walking program at home as it is indoors.    Consulted and Agree with Plan of Care Patient             Patient will benefit from skilled therapeutic intervention in order to improve the following deficits and impairments:  Abnormal gait, Decreased activity tolerance, Decreased coordination, Decreased balance, Decreased endurance, Decreased range of motion, Decreased strength, Dizziness, Impaired sensation  Visit Diagnosis: Muscle weakness (generalized)  Other abnormalities of gait and mobility  Unsteadiness on feet     Problem List Patient Active Problem List   Diagnosis Date Noted   History of Lyme disease 06/22/2020   Pre-operative clearance 12/03/2018   High risk medication use 07/25/2018   Well woman exam without gynecological exam 11/21/2017   Left leg weakness 10/14/2015    Hypothyroidism (acquired) 11/03/2014   Other fatigue 11/03/2014   Gait disorder 11/03/2014   MULTIPLE SCLEROSIS 09/01/2008    Arliss Journey, PT, DPT  08/02/2020, 12:02 PM  [facility] [address] [place], [place],  [address] Phone: [phone]   Fax:  [phone]  Name: BRIONY PARVEEN MRN: [id number] Date of Birth: 1960/12/25

## 2020-08-06 ENCOUNTER — Ambulatory Visit: Payer: PRIVATE HEALTH INSURANCE | Admitting: Physical Therapy

## 2020-08-06 ENCOUNTER — Other Ambulatory Visit: Payer: Self-pay

## 2020-08-06 ENCOUNTER — Encounter: Payer: Self-pay | Admitting: Physical Therapy

## 2020-08-06 DIAGNOSIS — M6281 Muscle weakness (generalized): Secondary | ICD-10-CM

## 2020-08-06 DIAGNOSIS — R29818 Other symptoms and signs involving the nervous system: Secondary | ICD-10-CM

## 2020-08-06 DIAGNOSIS — R2689 Other abnormalities of gait and mobility: Secondary | ICD-10-CM

## 2020-08-06 NOTE — Therapy (Addendum)
[facility] [address] [place], [place], [address] Phone: [phone]   Fax:  [phone]  Physical Therapy Treatment/Re-Cert  Patient Details  Name: Samantha Church MRN: [id number] Date of Birth: 1960/05/01 Referring Provider (PT): Felecia Shelling, Nanine Means, MD   Encounter Date: 08/06/2020   PT End of Session - 08/06/20 1015     Visit Number 16    Number of Visits 24    Date for PT Re-Evaluation 09/05/20    Authorization Type [employer] 2022, 60 VL combined (PT, OT, ST)    Authorization - Visit Number 16    Authorization - Number of Visits 70    PT Start Time 0933    PT Stop Time 1013    PT Time Calculation (min) 40 min    Activity Tolerance Patient tolerated treatment well    Behavior During Therapy [facility] for tasks assessed/performed             Past Medical History:  Diagnosis Date   Anemia    Hypothyroidism    Dr. [name]   Lyme disease    Multiple sclerosis ([place])    Neuromuscular disorder ([place])    MS   Vision abnormalities     Past Surgical History:  Procedure Laterality Date   AUGMENTATION MAMMAPLASTY Bilateral 2003   saline   BREAST IMPLANT REMOVAL Bilateral 02/17/2019   BREAST SURGERY  2002   COLONOSCOPY  2008   DILATATION & CURRETTAGE/HYSTEROSCOPY WITH RESECTOCOPE N/A 10/09/2012   Procedure: DILATATION & CURETTAGE/HYSTEROSCOPY WITH RESECTOCOPE;  Surgeon: [name], MD;  Location: [facility];  Service: Gynecology;  Laterality: N/A;  RESECTION with Versapoint    fibroid  2019   fibroidectomy   KNEE ARTHROSCOPY  1996   KNEE ARTHROSCOPY  1996    There were no vitals filed for this visit.   Subjective Assessment - 08/06/20 0935     Subjective No changes since she was last here.    Pertinent History relapsing MS (diagnosed 2007), hypothyroidism, knee arthroscopy, Lyme disease, status post Morton's neurectomy and Weil osteotomy of the second and third metatarsal right foot (12/2019).    Limitations  Walking    How long can you walk comfortably? 10 minutes    Patient Stated Goals wants to be able to walk without any assistance, improve confidence when walking    Currently in Pain? No/denies                [facility] PT Assessment - 08/06/20 1000       Assessment   Medical Diagnosis MS    Referring Provider (PT) Sater, Nanine Means, MD      Prior Function   Level of Independence Independent with community mobility with device      High Level Balance   High Level Balance Comments SLS: on RLE 2-3 seconds, LLE 1 second                       Neuro re-ed: sensory organization test performed with following results: Conditions: 1: WNL all 3 trials  2: WNL  all 3 trials  3: below average for 1st trial, WNL for remainder of 2 trails  4: WNL all 3 trials  5: below average 1st trial, WNL 2nd trials 6: WNL all 3 trials  Composite score: 77 = right at age related norms Sensory Analysis Som: ~95 (WNL) Vis: ~90 (WNL) Vest: 65(WNL)            Pref: 100 (WNL)  Strategy analysis: more hip dominant during conditions 4-6       COG alignment:to right      Access Code: [facility] URL: [email or web address] Date: 08/06/2020 Prepared by: Janann August  Reviewed bolded exercises:  Exercises Clamshell with Resistance - 2 x daily - 5 x weekly - 2 sets - 8 reps -incr reps to 8 (pt reports previously doing 5) - with green tband Bridge - 2 x daily - 5 x weekly - 2 sets - 5 reps - raising up onto R toes for modified SLS bridge and incr weight shift to LLE   Verbally reviewed exercises below:  Romberg Stance Eyes Closed on Foam Pad - 2 x daily - 5 x weekly - 3 sets - 10 reps Backward Walking with Counter Support - 2 x daily - 5 x weekly - 3 sets Tall Kneeling Posterior Pelvic Tilt - 1 x daily - 5 x weekly - 2 sets - 10 reps Half Kneeling Chop - 1 x daily - 5 x weekly - 2 sets - 10 reps Standing Toe Dorsiflexion Stretch - 1 x daily - 5 x weekly - 1 sets - 3 reps -  30 seconds hold Forward Step Touch - 2 x daily - 5 x weekly - 2 sets - 10 reps      PT Education - 08/06/20 1014     Education Details results of SOT, reviewed HEP, and goals going forwards.    Person(s) Educated Patient    Methods Explanation;Demonstration    Comprehension Verbalized understanding;Returned demonstration              PT Short Term Goals - 08/06/20 1335       PT SHORT TERM GOAL #1   Title ALL STGS = LTGS               PT Long Term Goals - 08/06/20 0955       PT LONG TERM GOAL #1   Title Pt will be independent with final HEP in order to build upon functional gains made in therapy. ALL LTGS DUE 08/08/20    Time 8    Period Weeks    Status Achieved      PT LONG TERM GOAL #2   Title Pt will improve 6MWT distance by at least 100' with single walking pole and rating fatigue as a 2-3/10 or less in order to demo improved walking endurance.    Baseline 1039 with 4-5/10 fatigue rating; 1264' on 08/02/20 with 3/10 fatigue rating    Time 8    Period Weeks    Status Achieved      PT LONG TERM GOAL #3   Title Pt will ambulate at least 115' over indoor level surfaces with no AD and supervision in order to demo improved household mobility.    Baseline met with no AD on 08/02/20    Time 8    Period Weeks    Status Achieved      PT LONG TERM GOAL #4   Title Pt will improve DGI score to at least a 21/24 in order to demo decr fall risk and FGA score to at least a 21/30 to decr fall risk.    Baseline DGI 18/24 on 07/07/20, FGA 17/30; on 7/25/22l DGI 22/24 and FGA score 22/30    Time 8    Period Weeks    Status Achieved      PT LONG TERM GOAL #5   Title Perform gait speed with no AD with LTG  to be written as appropriate.    Baseline 3.09 ft/sec with single walking pole; 9.4 = 3.48 ft/sec with no AD    Time 8    Period Weeks    Status Achieved      PT LONG TERM GOAL #6   Title Pt will perform all conditions 2 and 3 of mCTSIB WNL and have preference score WNL  in order to demo improved balance systems.    Baseline preference = 100, condition 2 WNL, condition 3 - 2 out of 3 WNL    Time 8    Period Weeks    Status Partially Met              Revised/ongoing LTGs:   PT Long Term Goals - 08/06/20 1348       PT LONG TERM GOAL #1   Title Pt will be independent with final HEP and walking program in order to build upon functional gains made in therapy. ALL LTGS DUE 09/03/20    Time 4    Period Weeks    Status Revised    Target Date 09/03/20      PT LONG TERM GOAL #2   Title Pt will improve 6MWT distance by at least 100' with single walking pole and rating fatigue as a 1-2/10 or less in order to demo improved walking endurance.    Baseline 1264' on 08/02/20 with 3/10 fatigue rating    Time 4    Period Weeks    Status New      PT LONG TERM GOAL #3   Title Pt will improve FGA score to at least a 24/30 in order to demo decr fall risk.    Baseline 22/30    Time 4    Period Weeks    Status Revised      PT LONG TERM GOAL #4   Title pt will ambulate at least 300' outdoors with no AD and perform a curb with supervision in order to demo improved gait for smaller community distances.    Time 4    Period Weeks    Status New                Plan - 08/06/20 1345     Clinical Impression Statement Today's skilled session focused on assessing remainder of pt's LTGs. Pt achieved LTG with independence with HEP. Pt partially met LTG #6 (otherwise has met all over LTGs) - improved SOT score for all sensory analysis of balance systems and preference score to be above age and diagnosis related norms. When assessing SLS time, pt only able to hold on LLE for 1 second and 2-3 seconds on RLE. Based on FGA score of a 22/30, pt is still at a moderate risk for falls. Due to progress with therapy,will re-cert for an additional 2x week for 4 weeks to continue to work on gait training with no AD, balance, LLE strength, functional transfers, endurance, and  finalizing pt's HEP and walking program. LTGs revised and updated as appropriate.    Personal Factors and Comorbidities Comorbidity 3+;Time since onset of injury/illness/exacerbation;Past/Current Experience    Comorbidities relapsing MS (diagnosed 2007), hypothyroidism, knee arthroscopy, Lyme disease, status post Morton's neurectomy and Weil osteotomy of the second and third metatarsal right foot (12/2019)    Examination-Activity Limitations Stairs;Transfers;Squat;Locomotion Level    Examination-Participation Restrictions Community Activity;Cleaning    Stability/Clinical Decision Making Stable/Uncomplicated    Rehab Potential Good    PT Frequency 2x / week    PT Duration  4 weeks    PT Treatment/Interventions ADLs/Self Care Home Management;Aquatic Therapy;Stair training;Gait training;DME Instruction;Functional mobility training;Therapeutic activities;Therapeutic exercise;Balance training;Neuromuscular re-education;Patient/family education;Orthotic Fit/Training;Passive range of motion;Vestibular    PT Next Visit Plan trial treadmill to see if pt can use for walking program at home as it is indoors (pt has a treadmill), SLS activities B, continue balance with eyes closed, LLE strengthening (with focus on hip abd and extensors), dynamic balance with no AD. update HEP as appropriate.    PT Home Exercise Plan [facility]    Consulted and Agree with Plan of Care Patient             Patient will benefit from skilled therapeutic intervention in order to improve the following deficits and impairments:  Abnormal gait, Decreased activity tolerance, Decreased coordination, Decreased balance, Decreased endurance, Decreased range of motion, Decreased strength, Dizziness, Impaired sensation  Visit Diagnosis: Muscle weakness (generalized)  Other abnormalities of gait and mobility  Other symptoms and signs involving the nervous system     Problem List Patient Active Problem List   Diagnosis Date Noted    History of Lyme disease 06/22/2020   Pre-operative clearance 12/03/2018   High risk medication use 07/25/2018   Well woman exam without gynecological exam 11/21/2017   Left leg weakness 10/14/2015   Hypothyroidism (acquired) 11/03/2014   Other fatigue 11/03/2014   Gait disorder 11/03/2014   MULTIPLE SCLEROSIS 09/01/2008    Arliss Journey, PT, DPT  08/06/2020, 1:47 PM  [facility] [address] [place], [place], [address] Phone: [phone]   Fax:  [phone]  Name: Samantha Church MRN: [id number] Date of Birth: 27-Dec-1960

## 2020-08-06 NOTE — Addendum Note (Signed)
Addended by: Arliss Journey on: 08/06/2020 01:51 PM   Modules accepted: Orders

## 2020-08-09 ENCOUNTER — Ambulatory Visit: Payer: PRIVATE HEALTH INSURANCE | Attending: Family Medicine

## 2020-08-09 ENCOUNTER — Other Ambulatory Visit: Payer: Self-pay

## 2020-08-09 DIAGNOSIS — M6281 Muscle weakness (generalized): Secondary | ICD-10-CM | POA: Insufficient documentation

## 2020-08-09 DIAGNOSIS — R2681 Unsteadiness on feet: Secondary | ICD-10-CM | POA: Insufficient documentation

## 2020-08-09 DIAGNOSIS — R2689 Other abnormalities of gait and mobility: Secondary | ICD-10-CM

## 2020-08-09 NOTE — Therapy (Signed)
[facility] [address] [place], [place], [address] Phone: [phone]   Fax:  [phone]  Physical Therapy Treatment  Patient Details  Name: Samantha Church MRN: [id number] Date of Birth: February 02, 1960 Referring Provider (PT): Felecia Shelling, Nanine Means, MD   Encounter Date: 08/09/2020   PT End of Session - 08/09/20 0935     Visit Number 17    Number of Visits 24    Date for PT Re-Evaluation 09/05/20    Authorization Type [employer] 2022, 60 VL combined (PT, OT, ST)    Authorization - Visit Number 17    Authorization - Number of Visits 56    PT Start Time [id number]    PT Stop Time 1015    PT Time Calculation (min) 42 min    Activity Tolerance Patient tolerated treatment well    Behavior During Therapy [facility] for tasks assessed/performed             Past Medical History:  Diagnosis Date   Anemia    Hypothyroidism    Dr. [name]   Lyme disease    Multiple sclerosis ([place])    Neuromuscular disorder ([place])    MS   Vision abnormalities     Past Surgical History:  Procedure Laterality Date   AUGMENTATION MAMMAPLASTY Bilateral 2003   saline   BREAST IMPLANT REMOVAL Bilateral 02/17/2019   BREAST SURGERY  2002   COLONOSCOPY  2008   DILATATION & CURRETTAGE/HYSTEROSCOPY WITH RESECTOCOPE N/A 10/09/2012   Procedure: DILATATION & CURETTAGE/HYSTEROSCOPY WITH RESECTOCOPE;  Surgeon: [name], MD;  Location: [facility];  Service: Gynecology;  Laterality: N/A;  RESECTION with Versapoint    fibroid  2019   fibroidectomy   KNEE ARTHROSCOPY  1996   KNEE ARTHROSCOPY  1996    There were no vitals filed for this visit.   Subjective Assessment - 08/09/20 0935     Subjective Patient reports no new changes/complaints. No pain. Wants to go over one of the exercises due to cramping in calf. No falls.    Pertinent History relapsing MS (diagnosed 2007), hypothyroidism, knee arthroscopy, Lyme disease, status post Morton's neurectomy and Weil  osteotomy of the second and third metatarsal right foot (12/2019).    Limitations Walking    How long can you walk comfortably? 10 minutes    Patient Stated Goals wants to be able to walk without any assistance, improve confidence when walking    Currently in Pain? No/denies                               [facility] Adult PT Treatment/Exercise - 08/09/20 0001       Ambulation/Gait   Ambulation/Gait Yes    Ambulation/Gait Assistance 5: Supervision    Ambulation/Gait Assistance Details gait on treadmill; see below    Assistive device None    Gait Pattern Decreased stance time - left;Decreased weight shift to left;Trendelenburg;Decreased hip/knee flexion - left;Left circumduction    Ambulation Surface Level;Indoor    Gait Comments completed gait on treadmill without BWS. Prior to gait training, BP: 129/88, HR: 58. Completed gait on treadmill at pace of 1.3 mph x 10 minutes with patient tolerating well. At 5 minute mark, HR: 78. At end of completion: BP: 129/90, HR: 80. No significant fatigue reported. PT educating on walking program and handout provided. PT educating on fatigue monitoring and slow progression of walking program.      Exercises   Exercises Other  Exercises    Other Exercises  Reviewed bridge with additional of heel lift, due to patient reports of cramp. Reviewed x 5 reps. PT educating to further shift weight onto oppostite LE with bridge to offload leg completing heel lift. This redcued cramp and pain with exercise. Patietn demonstrating and verbalize understanding of change.                 Balance Exercises - 08/09/20 0001       Balance Exercises: Standing   SLS with Vectors Solid surface;Intermittent upper extremity assist;Limitations    SLS with Vectors Limitations completed standing SLS on firm surface with opposite LE placed on soccerball, completed A/P rolls x 10 reps bilat, then lateral rolls x 10 reps bilar. Increased challenge with SLS on LLE  with intermittent UE support utilized               PT Education - 08/09/20 1017     Education Details Walking Program Indoors on Treadmill and/or Outdoors    Person(s) Educated Patient    Methods Explanation;Handout    Comprehension Verbalized understanding              PT Short Term Goals - 08/06/20 1335       PT SHORT TERM GOAL #1   Title ALL STGS = LTGS               PT Long Term Goals - 08/06/20 1348       PT LONG TERM GOAL #1   Title Pt will be independent with final HEP and walking program in order to build upon functional gains made in therapy. ALL LTGS DUE 09/03/20    Time 4    Period Weeks    Status Revised    Target Date 09/03/20      PT LONG TERM GOAL #2   Title Pt will improve 6MWT distance by at least 100' with single walking pole and rating fatigue as a 1-2/10 or less in order to demo improved walking endurance.    Baseline 1264' on 08/02/20 with 3/10 fatigue rating    Time 4    Period Weeks    Status New      PT LONG TERM GOAL #3   Title Pt will improve FGA score to at least a 24/30 in order to demo decr fall risk.    Baseline 22/30    Time 4    Period Weeks    Status Revised      PT LONG TERM GOAL #4   Title pt will ambulate at least 300' outdoors with no AD and perform a curb with supervision in order to demo improved gait for smaller community distances.    Time 4    Period Weeks    Status New                   Plan - 08/09/20 1104     Clinical Impression Statement Initiated gait training on treadmill training with patient tolerating well, able to tolerate 10 minutes with normal vital response and no significant fatigue. PT educating on walking program on treadmill/outdoors to further improved walking tolernace/endurance. Reviewed bridges with addition of heel lift and educating on proper form to reduce discomfort. Will continue to progress toward all LTGs.    Personal Factors and Comorbidities Comorbidity 3+;Time since  onset of injury/illness/exacerbation;Past/Current Experience    Comorbidities relapsing MS (diagnosed 2007), hypothyroidism, knee arthroscopy, Lyme disease, status post Morton's neurectomy and Weil osteotomy of  the second and third metatarsal right foot (12/2019)    Examination-Activity Limitations Stairs;Transfers;Squat;Locomotion Level    Examination-Participation Restrictions Community Activity;Cleaning    Stability/Clinical Decision Making Stable/Uncomplicated    Rehab Potential Good    PT Frequency 2x / week    PT Duration 4 weeks    PT Treatment/Interventions ADLs/Self Care Home Management;Aquatic Therapy;Stair training;Gait training;DME Instruction;Functional mobility training;Therapeutic activities;Therapeutic exercise;Balance training;Neuromuscular re-education;Patient/family education;Orthotic Fit/Training;Passive range of motion;Vestibular    PT Next Visit Plan how did feel after treadmill? SLS activities B, continue balance with eyes closed, LLE strengthening (with focus on hip abd and extensors), dynamic balance with no AD. update HEP as appropriate.    PT Home Exercise Plan [facility]    Consulted and Agree with Plan of Care Patient             Patient will benefit from skilled therapeutic intervention in order to improve the following deficits and impairments:  Abnormal gait, Decreased activity tolerance, Decreased coordination, Decreased balance, Decreased endurance, Decreased range of motion, Decreased strength, Dizziness, Impaired sensation  Visit Diagnosis: Muscle weakness (generalized)  Other abnormalities of gait and mobility  Unsteadiness on feet     Problem List Patient Active Problem List   Diagnosis Date Noted   History of Lyme disease 06/22/2020   Pre-operative clearance 12/03/2018   High risk medication use 07/25/2018   Well woman exam without gynecological exam 11/21/2017   Left leg weakness 10/14/2015   Hypothyroidism (acquired) 11/03/2014   Other  fatigue 11/03/2014   Gait disorder 11/03/2014   MULTIPLE SCLEROSIS 09/01/2008    Jones Bales, PT, DPT 08/09/2020, 11:07 AM  [facility] [address] [place], [place], [address] Phone: [phone]   Fax:  [phone]  Name: Samantha Church MRN: [id number] Date of Birth: Nov 07, 1960

## 2020-08-11 ENCOUNTER — Ambulatory Visit: Payer: PRIVATE HEALTH INSURANCE

## 2020-08-11 ENCOUNTER — Other Ambulatory Visit: Payer: Self-pay

## 2020-08-11 DIAGNOSIS — R2681 Unsteadiness on feet: Secondary | ICD-10-CM

## 2020-08-11 DIAGNOSIS — R2689 Other abnormalities of gait and mobility: Secondary | ICD-10-CM

## 2020-08-11 DIAGNOSIS — M6281 Muscle weakness (generalized): Secondary | ICD-10-CM

## 2020-08-11 NOTE — Therapy (Signed)
[facility] [address] [place], [place], [address] Phone: [phone]   Fax:  [phone]  Physical Therapy Treatment  Patient Details  Name: Samantha Church MRN: [id number] Date of Birth: Aug 07, 1960 Referring Provider (PT): Felecia Shelling, Nanine Means, MD   Encounter Date: 08/11/2020   PT End of Session - 08/11/20 0932     Visit Number 18    Number of Visits 24    Date for PT Re-Evaluation 09/05/20    Authorization Type [employer] 2022, 60 VL combined (PT, OT, ST)    Authorization - Visit Number 18    Authorization - Number of Visits 67    PT Start Time 0932    PT Stop Time 1012    PT Time Calculation (min) 40 min    Activity Tolerance Patient tolerated treatment well    Behavior During Therapy [facility] for tasks assessed/performed             Past Medical History:  Diagnosis Date   Anemia    Hypothyroidism    Dr. [name]   Lyme disease    Multiple sclerosis ([place])    Neuromuscular disorder ([place])    MS   Vision abnormalities     Past Surgical History:  Procedure Laterality Date   AUGMENTATION MAMMAPLASTY Bilateral 2003   saline   BREAST IMPLANT REMOVAL Bilateral 02/17/2019   BREAST SURGERY  2002   COLONOSCOPY  2008   DILATATION & CURRETTAGE/HYSTEROSCOPY WITH RESECTOCOPE N/A 10/09/2012   Procedure: DILATATION & CURETTAGE/HYSTEROSCOPY WITH RESECTOCOPE;  Surgeon: [name], MD;  Location: [facility];  Service: Gynecology;  Laterality: N/A;  RESECTION with [facility]ap[facility]    fibroid  2019   fibroidectomy   KNEE ARTHROSCOPY  1996   KNEE ARTHROSCOPY  1996    There were no vitals filed for this visit.   Subjective Assessment - 08/11/20 0934     Subjective No new changes/complaints. Reports was fatigued after walking on treadmill but resolved quickly.Reports taht the cramps in calf resolved with changing the exercise. No pain.    Pertinent History relapsing MS (diagnosed 2007), hypothyroidism, knee arthroscopy, Lyme  disease, status post Morton's neurectomy and Weil osteotomy of the second and third metatarsal right foot (12/2019).    Limitations Walking    How long can you walk comfortably? 10 minutes    Patient Stated Goals wants to be able to walk without any assistance, improve confidence when walking    Currently in Pain? No/denies               [facility] Adult PT Treatment/Exercise - 08/11/20 0001       Transfers   Transfers Sit to Stand;Stand to Sit    Sit to Stand 6: Modified independent (Device/Increase time)    Stand to Sit 6: Modified independent (Device/Increase time)    Number of Reps 10 reps;2 sets    Comments completed sit <> stands with feet in staggered position, LLE posteroir with first set, then RLE posterior with 2nd set. No UE support. seated rest break intermittently      Ambulation/Gait   Ambulation/Gait Yes    Ambulation/Gait Assistance 5: Supervision    Ambulation/Gait Assistance Details gait on treadmill and throughout therapy, plus additional distance throughout session without AD w/ high level balance    Ambulation Distance (Feet) --   clinic distance   Assistive device None    Gait Pattern Decreased stance time - left;Decreased weight shift to left;Trendelenburg;Decreased hip/knee flexion - left;Left circumduction  Ambulation Surface Level;Indoor      High Level Balance   High Level Balance Activities Backward walking;Direction changes;Sudden stops;Head turns;Turns    High Level Balance Comments completed ambulation with high level balance x 230 ft around therapy gym without AD. Added addition of head turns/nods, direction changes, sudden stops, and backwards walking. No significant imbalance with completion, mild fatigue noted in LLE      Exercises   Exercises Knee/Hip      Knee/Hip Exercises: Aerobic   Tread Mill Completed warm up on Treadmill at 1.3 mph x 5 minutes with BUE support. Patient tolerating well.      Knee/Hip Exercises: Standing   Forward Step Up  Both;1 set;10 reps;Hand Hold: 0;Step Height: 6";Limitations    Forward Step Up Limitations completed withoout UE support at first step x 10 reps bilat, cues for alternating. increased challenge with LLE > RLE              Balance Exercises - 08/11/20 0001       Balance Exercises: Standing   Standing Eyes Closed Wide (BOA);Narrow base of support (BOS);Foam/compliant surface;Limitations    Standing Eyes Closed Limitations on blue mat on incline: starting wide BOS 2 x 30 seconds, then progressed to more narrow BOS 2 x 30 seconds.    SLS with Vectors Foam/compliant surface;Intermittent upper extremity assist;Limitations    SLS with Vectors Limitations standing on blue mat on incline surface: completed alternating toe taps forward to colored pebbles x 10 reps bilat, then second set progressing to crossover toe taps x 10 reps. increased challenge with crossover and SLS on LLE.    Marching Foam/compliant surface;Static;Limitations    Marching Limitations completed standing on blue mat on incline: completed alternating marching x 15 reps bilat with cues for standing tall/posture. CGA    Other Standing Exercises standing on blue mat on incline: completed staggered stance with eyes open and horiz/vertical head turns x 10 bilat directions, alternating foot position. increased challenge with horizontal/vertical.               PT Short Term Goals - 08/06/20 1335       PT SHORT TERM GOAL #1   Title ALL STGS = LTGS               PT Long Term Goals - 08/06/20 1348       PT LONG TERM GOAL #1   Title Pt will be independent with final HEP and walking program in order to build upon functional gains made in therapy. ALL LTGS DUE 09/03/20    Time 4    Period Weeks    Status Revised    Target Date 09/03/20      PT LONG TERM GOAL #2   Title Pt will improve 6MWT distance by at least 100' with single walking pole and rating fatigue as a 1-2/10 or less in order to demo improved walking  endurance.    Baseline 1264' on 08/02/20 with 3/10 fatigue rating    Time 4    Period Weeks    Status New      PT LONG TERM GOAL #3   Title Pt will improve FGA score to at least a 24/30 in order to demo decr fall risk.    Baseline 22/30    Time 4    Period Weeks    Status Revised      PT LONG TERM GOAL #4   Title pt will ambulate at least 300' outdoors with no AD  and perform a curb with supervision in order to demo improved gait for smaller community distances.    Time 4    Period Weeks    Status New                   Plan - 08/11/20 1029     Clinical Impression Statement Continued use of treadmill today as warm up with patient tolerating well. Patient continued session focused on high level balance and balnace on complaint surfaces. continue to demo increased challenge with SLS on LLE > RLE. Intermittent rest breaks required due to fatigue. Overall patient continues to demo progress with PT services.    Personal Factors and Comorbidities Comorbidity 3+;Time since onset of injury/illness/exacerbation;Past/Current Experience    Comorbidities relapsing MS (diagnosed 2007), hypothyroidism, knee arthroscopy, Lyme disease, status post Morton's neurectomy and Weil osteotomy of the second and third metatarsal right foot (12/2019)    Examination-Activity Limitations Stairs;Transfers;Squat;Locomotion Level    Examination-Participation Restrictions Community Activity;Cleaning    Stability/Clinical Decision Making Stable/Uncomplicated    Rehab Potential Good    PT Frequency 2x / week    PT Duration 4 weeks    PT Treatment/Interventions ADLs/Self Care Home Management;Aquatic Therapy;Stair training;Gait training;DME Instruction;Functional mobility training;Therapeutic activities;Therapeutic exercise;Balance training;Neuromuscular re-education;Patient/family education;Orthotic Fit/Training;Passive range of motion;Vestibular    PT Next Visit Plan Continue activities for endurance. SLS  activities B, continue balance with eyes closed, LLE strengthening (with focus on hip abd and extensors), dynamic balance with no AD. update HEP as appropriate.    PT Home Exercise Plan [facility]    Consulted and Agree with Plan of Care Patient             Patient will benefit from skilled therapeutic intervention in order to improve the following deficits and impairments:  Abnormal gait, Decreased activity tolerance, Decreased coordination, Decreased balance, Decreased endurance, Decreased range of motion, Decreased strength, Dizziness, Impaired sensation  Visit Diagnosis: Muscle weakness (generalized)  Unsteadiness on feet  Other abnormalities of gait and mobility     Problem List Patient Active Problem List   Diagnosis Date Noted   History of Lyme disease 06/22/2020   Pre-operative clearance 12/03/2018   High risk medication use 07/25/2018   Well woman exam without gynecological exam 11/21/2017   Left leg weakness 10/14/2015   Hypothyroidism (acquired) 11/03/2014   Other fatigue 11/03/2014   Gait disorder 11/03/2014   MULTIPLE SCLEROSIS 09/01/2008    Jones Bales, PT, DPT 08/11/2020, 10:39 AM  [facility] [address] [place], [place], [address] Phone: [phone]   Fax:  [phone]  Name: LACY HULSEBUS MRN: [id number] Date of Birth: 1960/09/05

## 2020-08-16 ENCOUNTER — Ambulatory Visit: Payer: PRIVATE HEALTH INSURANCE

## 2020-08-16 ENCOUNTER — Other Ambulatory Visit: Payer: Self-pay

## 2020-08-16 DIAGNOSIS — M6281 Muscle weakness (generalized): Secondary | ICD-10-CM

## 2020-08-16 DIAGNOSIS — R2681 Unsteadiness on feet: Secondary | ICD-10-CM

## 2020-08-16 DIAGNOSIS — R2689 Other abnormalities of gait and mobility: Secondary | ICD-10-CM

## 2020-08-16 NOTE — Therapy (Signed)
[facility] [address] [place], [place], [address] Phone: [phone]   Fax:  [phone]  Physical Therapy Treatment  Patient Details  Name: Samantha Church MRN: [id number] Date of Birth: Mar 02, 1960 Referring Provider (PT): Britt Bottom, MD   Encounter Date: 08/16/2020   PT End of Session - 08/16/20 0937     Visit Number 19    Number of Visits 24    Date for PT Re-Evaluation 09/05/20    Authorization Type [employer] 2022, 60 VL combined (PT, OT, ST)    Authorization - Visit Number 28    Authorization - Number of Visits 40    PT Start Time 0933    PT Stop Time 1014    PT Time Calculation (min) 41 min    Activity Tolerance Patient tolerated treatment well    Behavior During Therapy [facility] for tasks assessed/performed             Past Medical History:  Diagnosis Date   Anemia    Hypothyroidism    Dr. [name]   Lyme disease    Multiple sclerosis ([place])    Neuromuscular disorder ([place])    MS   Vision abnormalities     Past Surgical History:  Procedure Laterality Date   AUGMENTATION MAMMAPLASTY Bilateral 2003   saline   BREAST IMPLANT REMOVAL Bilateral 02/17/2019   BREAST SURGERY  2002   COLONOSCOPY  2008   DILATATION & CURRETTAGE/HYSTEROSCOPY WITH RESECTOCOPE N/A 10/09/2012   Procedure: DILATATION & CURETTAGE/HYSTEROSCOPY WITH RESECTOCOPE;  Surgeon: [name], MD;  Location: [facility];  Service: Gynecology;  Laterality: N/A;  RESECTION with Versapoint    fibroid  2019   fibroidectomy   KNEE ARTHROSCOPY  1996   KNEE ARTHROSCOPY  1996    There were no vitals filed for this visit.   Subjective Assessment - 08/16/20 0935     Subjective No new changes. Reports tried the treadmill for 10 minutes, had some residual fatigue the next day. No pain. Went to [employer].    Pertinent History relapsing MS (diagnosed 2007), hypothyroidism, knee arthroscopy, Lyme disease, status post Morton's neurectomy and  Weil osteotomy of the second and third metatarsal right foot (12/2019).    Limitations Walking    How long can you walk comfortably? 10 minutes    Patient Stated Goals wants to be able to walk without any assistance, improve confidence when walking    Currently in Pain? No/denies                 [facility] Adult PT Treatment/Exercise - 08/16/20 0001       Transfers   Transfers Sit to Stand;Stand to Sit    Sit to Stand 6: Modified independent (Device/Increase time)    Stand to Sit 6: Modified independent (Device/Increase time)    Number of Reps 10 reps;1 set    Comments sit <> stand from chair with red balance beam under BLE, x 10 reps. CGA required.      Ambulation/Gait   Ambulation/Gait Yes    Ambulation/Gait Assistance 5: Supervision    Ambulation/Gait Assistance Details gait on treadmill (see below); plus additional clinic distance    Ambulation Distance (Feet) --   clinic distance   Assistive device None    Gait Pattern Decreased stance time - left;Decreased weight shift to left;Trendelenburg;Decreased hip/knee flexion - left;Left circumduction    Ambulation Surface Level;Indoor      High Level Balance   High Level Balance Activities Negotiating over  obstacles    High Level Balance Comments completed ambulation with negotiating/stepping over cones, prior to step completed toe tap to further promote SLS, completed x 3 laps down and back CGA required. increased challenge with SLS on LLE > RLE.      Neuro Re-ed    Neuro Re-ed Details  In half kneeling on red mat: completed half kneeling with trunk rotations with 1 lb weighted ball x 10 reps. In half kneeling completed eyes closed 2 x 30 seconds bilat, alternating foot position. Then completed half kneeling with vertical horiz/vertical head turns x 10 reps each direction, alternating position intermittently. In Tall Kneeling completed hip hinges with resistance from anterior, PT providing resistance via blue theraband to promote hip  extensors, completed 2 x 10 reps.      Exercises   Exercises Knee/Hip      Knee/Hip Exercises: Aerobic   Tread Mill Completed warm up on Treadmill at 1.4 mph x 5 minutes on 3% incline with BUE support. Patient tolerating increase in speed well.                 Balance Exercises - 08/16/20 0001       Balance Exercises: Standing   Standing Eyes Closed Wide (BOA);Narrow base of support (BOS);Foam/compliant surface;Limitations    Standing Eyes Closed Limitations on blue mat on incline (but facing downwards), completed EC 4 x 30 seconds. CGA    Other Standing Exercises standing on blue mat on incline: completed staggered stance with eyes open and horiz/vertical head turns x 10 bilat directions, alternating foot position. increased challenge with horizontal > vertical.                 PT Short Term Goals - 08/06/20 1335       PT SHORT TERM GOAL #1   Title ALL STGS = LTGS               PT Long Term Goals - 08/06/20 1348       PT LONG TERM GOAL #1   Title Pt will be independent with final HEP and walking program in order to build upon functional gains made in therapy. ALL LTGS DUE 09/03/20    Time 4    Period Weeks    Status Revised    Target Date 09/03/20      PT LONG TERM GOAL #2   Title Pt will improve 6MWT distance by at least 100' with single walking pole and rating fatigue as a 1-2/10 or less in order to demo improved walking endurance.    Baseline 1264' on 08/02/20 with 3/10 fatigue rating    Time 4    Period Weeks    Status New      PT LONG TERM GOAL #3   Title Pt will improve FGA score to at least a 24/30 in order to demo decr fall risk.    Baseline 22/30    Time 4    Period Weeks    Status Revised      PT LONG TERM GOAL #4   Title pt will ambulate at least 300' outdoors with no AD and perform a curb with supervision in order to demo improved gait for smaller community distances.    Time 4    Period Weeks    Status New                    Plan - 08/16/20 1159     Clinical Impression Statement Continued treadmill  training with progression of speed and slight incline with patient tolerating well. Continued rest of session focused on NMR standing balance activites and half kneeling activities to promote proximal hip stability. Will continue to progress toward all LTGs.    Personal Factors and Comorbidities Comorbidity 3+;Time since onset of injury/illness/exacerbation;Past/Current Experience    Comorbidities relapsing MS (diagnosed 2007), hypothyroidism, knee arthroscopy, Lyme disease, status post Morton's neurectomy and Weil osteotomy of the second and third metatarsal right foot (12/2019)    Examination-Activity Limitations Stairs;Transfers;Squat;Locomotion Level    Examination-Participation Restrictions Community Activity;Cleaning    Stability/Clinical Decision Making Stable/Uncomplicated    Rehab Potential Good    PT Frequency 2x / week    PT Duration 4 weeks    PT Treatment/Interventions ADLs/Self Care Home Management;Aquatic Therapy;Stair training;Gait training;DME Instruction;Functional mobility training;Therapeutic activities;Therapeutic exercise;Balance training;Neuromuscular re-education;Patient/family education;Orthotic Fit/Training;Passive range of motion;Vestibular    PT Next Visit Plan Continue activities for endurance. SLS activities B, continue balance with eyes closed, LLE strengthening (with focus on hip abd and extensors), dynamic balance with no AD. update HEP as appropriate.    PT Home Exercise Plan [facility]    Consulted and Agree with Plan of Care Patient             Patient will benefit from skilled therapeutic intervention in order to improve the following deficits and impairments:  Abnormal gait, Decreased activity tolerance, Decreased coordination, Decreased balance, Decreased endurance, Decreased range of motion, Decreased strength, Dizziness, Impaired sensation  Visit  Diagnosis: Muscle weakness (generalized)  Unsteadiness on feet  Other abnormalities of gait and mobility     Problem List Patient Active Problem List   Diagnosis Date Noted   History of Lyme disease 06/22/2020   Pre-operative clearance 12/03/2018   High risk medication use 07/25/2018   Well woman exam without gynecological exam 11/21/2017   Left leg weakness 10/14/2015   Hypothyroidism (acquired) 11/03/2014   Other fatigue 11/03/2014   Gait disorder 11/03/2014   MULTIPLE SCLEROSIS 09/01/2008    Jones Bales, PT, DPT 08/16/2020, 12:01 PM  [facility] [address] [place], [place], [address] Phone: [phone]   Fax:  [phone]  Name: Samantha Church MRN: [id number] Date of Birth: August 20, 1960

## 2020-08-19 ENCOUNTER — Ambulatory Visit: Payer: PRIVATE HEALTH INSURANCE | Admitting: Physical Therapy

## 2020-08-19 ENCOUNTER — Encounter: Payer: Self-pay | Admitting: Physical Therapy

## 2020-08-19 ENCOUNTER — Other Ambulatory Visit: Payer: Self-pay

## 2020-08-19 DIAGNOSIS — R2681 Unsteadiness on feet: Secondary | ICD-10-CM

## 2020-08-19 DIAGNOSIS — M6281 Muscle weakness (generalized): Secondary | ICD-10-CM | POA: Diagnosis not present

## 2020-08-19 DIAGNOSIS — R2689 Other abnormalities of gait and mobility: Secondary | ICD-10-CM

## 2020-08-19 NOTE — Therapy (Signed)
[facility] [address] [place], [place], [address] Phone: [phone]   Fax:  [phone]  Physical Therapy Treatment  Patient Details  Name: Samantha Church MRN: [id number] Date of Birth: 1960/05/13 Referring Provider (PT): Felecia Shelling, Nanine Means, MD   Encounter Date: 08/19/2020   PT End of Session - 08/19/20 1314     Visit Number 20    Number of Visits 24    Date for PT Re-Evaluation 09/05/20    Authorization Type [employer] 2022, 60 VL combined (PT, OT, ST)    Authorization - Visit Number 20    Authorization - Number of Visits 56    PT Start Time 0932    PT Stop Time 1014    PT Time Calculation (min) 42 min    Activity Tolerance Patient tolerated treatment well    Behavior During Therapy [facility] for tasks assessed/performed             Past Medical History:  Diagnosis Date   Anemia    Hypothyroidism    Dr. [name]   Lyme disease    Multiple sclerosis ([place])    Neuromuscular disorder ([place])    MS   Vision abnormalities     Past Surgical History:  Procedure Laterality Date   AUGMENTATION MAMMAPLASTY Bilateral 2003   saline   BREAST IMPLANT REMOVAL Bilateral 02/17/2019   BREAST SURGERY  2002   COLONOSCOPY  2008   DILATATION & CURRETTAGE/HYSTEROSCOPY WITH RESECTOCOPE N/A 10/09/2012   Procedure: DILATATION & CURETTAGE/HYSTEROSCOPY WITH RESECTOCOPE;  Surgeon: [name], MD;  Location: [facility];  Service: Gynecology;  Laterality: N/A;  RESECTION with Versapoint    fibroid  2019   fibroidectomy   KNEE ARTHROSCOPY  1996   KNEE ARTHROSCOPY  1996    There were no vitals filed for this visit.   Subjective Assessment - 08/19/20 0934     Subjective Has been doing the treadmill 1x per day and has been going well. Trying to do it 2x on the weekend.    Pertinent History relapsing MS (diagnosed 2007), hypothyroidism, knee arthroscopy, Lyme disease, status post Morton's neurectomy and Weil osteotomy of the second and  third metatarsal right foot (12/2019).    Limitations Walking    How long can you walk comfortably? 10 minutes    Patient Stated Goals wants to be able to walk without any assistance, improve confidence when walking    Currently in Pain? No/denies                               [facility] Adult PT Treatment/Exercise - 08/19/20 0934       Neuro Re-ed    Neuro Re-ed Details  in half kneeling on mat table: with LLE posteriorly ball toss with PT tech and then adding blue air ex under LLE for compliant surface, and then performed with LLE anteriorly on foam, performed multi directions to involve trunk rotation to catch ball, min guard for balance as needed, multiple reps performed      Knee/Hip Exercises: Aerobic   Tread Mill Completed warm up on Treadmill at 1.4 mph x 5 minutes with BUE support                 Balance Exercises - 08/19/20 0950       Balance Exercises: Standing   Standing Eyes Closed Foam/compliant surface    Standing Eyes Closed Limitations modified tandem with LLE posteriorly 3  x 15 seconds, with wider BOS with partial tandem eyes closed x5 reps head nods, x5 reps head turns    SLS with Vectors Foam/compliant surface    SLS with Vectors Limitations on blue air ex standing on LLE, x10 reps forward cone taps with RLE UE support > none, x10 reps cross body cone taps RLE with single UE support    Tandem Gait Forward;Intermittent upper extremity support;3 reps;Limitations    Tandem Gait Limitations down and back x3 reps on blue balance beam    Sidestepping Foam/compliant support;Limitations    Sidestepping Limitations on blue foam beam: with head turns, head head nods - each down and back x1 rep, intermittent taps to counter for balance   Other Standing Exercises with LLE as stance leg on blue balance beam in standing, moving RLE forwards and back into tandem stancex8 reps    Other Standing Exercises Comments on 6 stepping stones next to countertop, down  and back x3 reps with UE support, cues for slowed and controlled for SLS                 PT Short Term Goals - 08/06/20 1335       PT SHORT TERM GOAL #1   Title ALL STGS = LTGS               PT Long Term Goals - 08/06/20 1348       PT LONG TERM GOAL #1   Title Pt will be independent with final HEP and walking program in order to build upon functional gains made in therapy. ALL LTGS DUE 09/03/20    Time 4    Period Weeks    Status Revised    Target Date 09/03/20      PT LONG TERM GOAL #2   Title Pt will improve 6MWT distance by at least 100' with single walking pole and rating fatigue as a 1-2/10 or less in order to demo improved walking endurance.    Baseline 1264' on 08/02/20 with 3/10 fatigue rating    Time 4    Period Weeks    Status New      PT LONG TERM GOAL #3   Title Pt will improve FGA score to at least a 24/30 in order to demo decr fall risk.    Baseline 22/30    Time 4    Period Weeks    Status Revised      PT LONG TERM GOAL #4   Title pt will ambulate at least 300' outdoors with no AD and perform a curb with supervision in order to demo improved gait for smaller community distances.    Time 4    Period Weeks    Status New                   Plan - 08/19/20 1314     Clinical Impression Statement Continued with warm up on treadmill with pt tolerating well as pt has been using her treadmill at home for 1-2x daily. Remainder of session focused on half kneeling for proximal hip/trunk stability and balance on compliant surfaces. Will continue to progress towards LTGs.    Personal Factors and Comorbidities Comorbidity 3+;Time since onset of injury/illness/exacerbation;Past/Current Experience    Comorbidities relapsing MS (diagnosed 2007), hypothyroidism, knee arthroscopy, Lyme disease, status post Morton's neurectomy and Weil osteotomy of the second and third metatarsal right foot (12/2019)    Examination-Activity Limitations  Stairs;Transfers;Squat;Locomotion Level    Examination-Participation Restrictions Art gallery manager  Stability/Clinical Decision Making Stable/Uncomplicated    Rehab Potential Good    PT Frequency 2x / week    PT Duration 4 weeks    PT Treatment/Interventions ADLs/Self Care Home Management;Aquatic Therapy;Stair training;Gait training;DME Instruction;Functional mobility training;Therapeutic activities;Therapeutic exercise;Balance training;Neuromuscular re-education;Patient/family education;Orthotic Fit/Training;Passive range of motion;Vestibular    PT Next Visit Plan warm up on treadmill. Continue activities for endurance. SLS activities B, continue balance with eyes closed, LLE strengthening (with focus on hip abd and extensors), dynamic balance with no AD. update HEP as appropriate.    PT Home Exercise Plan [facility]    Consulted and Agree with Plan of Care Patient             Patient will benefit from skilled therapeutic intervention in order to improve the following deficits and impairments:  Abnormal gait, Decreased activity tolerance, Decreased coordination, Decreased balance, Decreased endurance, Decreased range of motion, Decreased strength, Dizziness, Impaired sensation  Visit Diagnosis: Unsteadiness on feet  Muscle weakness (generalized)  Other abnormalities of gait and mobility     Problem List Patient Active Problem List   Diagnosis Date Noted   History of Lyme disease 06/22/2020   Pre-operative clearance 12/03/2018   High risk medication use 07/25/2018   Well woman exam without gynecological exam 11/21/2017   Left leg weakness 10/14/2015   Hypothyroidism (acquired) 11/03/2014   Other fatigue 11/03/2014   Gait disorder 11/03/2014   MULTIPLE SCLEROSIS 09/01/2008    Arliss Journey, PT, DPT  08/19/2020, 1:18 PM  [facility] [address] [place], [place], [address] Phone: [phone]    Fax:  [phone]  Name: KYANAH FELAN MRN: [id number] Date of Birth: 1960-11-06

## 2020-08-23 ENCOUNTER — Other Ambulatory Visit: Payer: Self-pay

## 2020-08-23 ENCOUNTER — Ambulatory Visit: Payer: PRIVATE HEALTH INSURANCE

## 2020-08-23 DIAGNOSIS — R2689 Other abnormalities of gait and mobility: Secondary | ICD-10-CM

## 2020-08-23 DIAGNOSIS — M6281 Muscle weakness (generalized): Secondary | ICD-10-CM | POA: Diagnosis not present

## 2020-08-23 DIAGNOSIS — R2681 Unsteadiness on feet: Secondary | ICD-10-CM

## 2020-08-23 NOTE — Therapy (Signed)
[facility] [address] [place], [place], [address] Phone: [phone]   Fax:  [phone]  Physical Therapy Treatment  Patient Details  Name: Samantha Church MRN: [id number] Date of Birth: 1960/04/08 Referring Provider (PT): Felecia Shelling, Nanine Means, MD   Encounter Date: 08/23/2020   PT End of Session - 08/23/20 0936     Visit Number 21    Number of Visits 24    Date for PT Re-Evaluation 09/05/20    Authorization Type [employer] 2022, 60 VL combined (PT, OT, ST)    Authorization - Visit Number 21    Authorization - Number of Visits 55    PT Start Time 0933    PT Stop Time 1013    PT Time Calculation (min) 40 min    Activity Tolerance Patient tolerated treatment well    Behavior During Therapy [facility] for tasks assessed/performed             Past Medical History:  Diagnosis Date   Anemia    Hypothyroidism    Dr. [name]   Lyme disease    Multiple sclerosis ([place])    Neuromuscular disorder ([place])    MS   Vision abnormalities     Past Surgical History:  Procedure Laterality Date   AUGMENTATION MAMMAPLASTY Bilateral 2003   saline   BREAST IMPLANT REMOVAL Bilateral 02/17/2019   BREAST SURGERY  2002   COLONOSCOPY  2008   DILATATION & CURRETTAGE/HYSTEROSCOPY WITH RESECTOCOPE N/A 10/09/2012   Procedure: DILATATION & CURETTAGE/HYSTEROSCOPY WITH RESECTOCOPE;  Surgeon: [name], MD;  Location: [facility];  Service: Gynecology;  Laterality: N/A;  RESECTION with Versapoint    fibroid  2019   fibroidectomy   KNEE ARTHROSCOPY  1996   KNEE ARTHROSCOPY  1996    There were no vitals filed for this visit.   Subjective Assessment - 08/23/20 0934     Subjective Patient reports she is going to try cut down time so she can do the treadmill 2x/day. No falls or pain to report.    Pertinent History relapsing MS (diagnosed 2007), hypothyroidism, knee arthroscopy, Lyme disease, status post Morton's neurectomy and Weil osteotomy of  the second and third metatarsal right foot (12/2019).    Limitations Walking    How long can you walk comfortably? 10 minutes    Patient Stated Goals wants to be able to walk without any assistance, improve confidence when walking    Currently in Pain? No/denies                               [facility] Adult PT Treatment/Exercise - 08/23/20 0001       Transfers   Transfers Sit to Stand;Stand to Sit    Sit to Stand 6: Modified independent (Device/Increase time)    Stand to Sit 6: Modified independent (Device/Increase time)      Ambulation/Gait   Ambulation/Gait Yes    Ambulation/Gait Assistance 5: Supervision    Ambulation/Gait Assistance Details with high level balance and on ambulation    Assistive device None    Gait Pattern Decreased stance time - left;Decreased weight shift to left;Trendelenburg;Decreased hip/knee flexion - left;Left circumduction    Ambulation Surface Level;Indoor      High Level Balance   High Level Balance Activities Head turns;Sudden stops    High Level Balance Comments completed ambulation x 350 ft with horizontal/vertiacl head turns on command from PT. 1-2 stumbles/veering noted. Completed obstalce course  with stepping over cones  followed by tandem gait on blue mat x 4 laps down and back. CGA with tandem.      Knee/Hip Exercises: Aerobic   Tread Mill Completed warm up on Treadmill at 1.4 mph x 5 minutes with BUE support.                 Balance Exercises - 08/23/20 0001       Balance Exercises: Standing   SLS Eyes open;Solid surface;3 reps;15 secs;Limitations    SLS Limitations added to HEP, light UE support from single arm.    SLS with Vectors Solid surface;Limitations    SLS with Vectors Limitations completed alternating toe taps to colored cone, called out by PT alternating LE without UE support on firm surface    Rockerboard Anterior/posterior;EO;Limitations    Rockerboard Limitations toe taps with alternating BLE to  single cone x 15 reps. intermittent UE support.    Retro Gait Foam/compliant surface;4 reps;Limitations    Retro Gait Limitations on blue mat at countertop intermittent UE supprt x 4 laps.    Marching Foam/compliant surface;Intermittent upper extremity assist;Forwards;Limitations    Marching Limitations on blue mat at countertop intermittent UE supprt x 4 laps.    Other Standing Exercises Comments completed small/large stepping stones next to countertop, down and back x 3 reps with UE support, cues for slowed and controlled for SLS and improved weight shift.               PT Education - 08/23/20 1009     Education Details SLS addition to HEP    Person(s) Educated Patient    Methods Explanation    Comprehension Verbalized understanding              PT Short Term Goals - 08/06/20 1335       PT SHORT TERM GOAL #1   Title ALL STGS = LTGS               PT Long Term Goals - 08/06/20 1348       PT LONG TERM GOAL #1   Title Pt will be independent with final HEP and walking program in order to build upon functional gains made in therapy. ALL LTGS DUE 09/03/20    Time 4    Period Weeks    Status Revised    Target Date 09/03/20      PT LONG TERM GOAL #2   Title Pt will improve 6MWT distance by at least 100' with single walking pole and rating fatigue as a 1-2/10 or less in order to demo improved walking endurance.    Baseline 1264' on 08/02/20 with 3/10 fatigue rating    Time 4    Period Weeks    Status New      PT LONG TERM GOAL #3   Title Pt will improve FGA score to at least a 24/30 in order to demo decr fall risk.    Baseline 22/30    Time 4    Period Weeks    Status Revised      PT LONG TERM GOAL #4   Title pt will ambulate at least 300' outdoors with no AD and perform a curb with supervision in order to demo improved gait for smaller community distances.    Time 4    Period Weeks    Status New                   Plan - 08/23/20 1029  Clinical Impression Statement Continued treadmill as warm up, PT educating on breaking 10 minutes into two session with completion at home. Continued rest of session focused on high level balance and SLS activities. Continued challenge on LLE > RLE.    Personal Factors and Comorbidities Comorbidity 3+;Time since onset of injury/illness/exacerbation;Past/Current Experience    Comorbidities relapsing MS (diagnosed 2007), hypothyroidism, knee arthroscopy, Lyme disease, status post Morton's neurectomy and Weil osteotomy of the second and third metatarsal right foot (12/2019)    Examination-Activity Limitations Stairs;Transfers;Squat;Locomotion Level    Examination-Participation Restrictions Community Activity;Cleaning    Stability/Clinical Decision Making Stable/Uncomplicated    Rehab Potential Good    PT Frequency 2x / week    PT Duration 4 weeks    PT Treatment/Interventions ADLs/Self Care Home Management;Aquatic Therapy;Stair training;Gait training;DME Instruction;Functional mobility training;Therapeutic activities;Therapeutic exercise;Balance training;Neuromuscular re-education;Patient/family education;Orthotic Fit/Training;Passive range of motion;Vestibular    PT Next Visit Plan warm up on treadmill. Check LTGs. Continue activities for endurance. SLS activities B, continue balance with eyes closed, LLE strengthening (with focus on hip abd and extensors), dynamic balance with no AD. update HEP as appropriate.    PT Home Exercise Plan [facility]    Consulted and Agree with Plan of Care Patient             Patient will benefit from skilled therapeutic intervention in order to improve the following deficits and impairments:  Abnormal gait, Decreased activity tolerance, Decreased coordination, Decreased balance, Decreased endurance, Decreased range of motion, Decreased strength, Dizziness, Impaired sensation  Visit Diagnosis: Unsteadiness on feet  Muscle weakness (generalized)  Other  abnormalities of gait and mobility     Problem List Patient Active Problem List   Diagnosis Date Noted   History of Lyme disease 06/22/2020   Pre-operative clearance 12/03/2018   High risk medication use 07/25/2018   Well woman exam without gynecological exam 11/21/2017   Left leg weakness 10/14/2015   Hypothyroidism (acquired) 11/03/2014   Other fatigue 11/03/2014   Gait disorder 11/03/2014   MULTIPLE SCLEROSIS 09/01/2008    Jones Bales, PT, DPT 08/23/2020, 11:08 AM  [facility] [address] [place], [place], [address] Phone: [phone]   Fax:  [phone]  Name: Samantha Church MRN: [id number] Date of Birth: 1960-07-29

## 2020-08-23 NOTE — Patient Instructions (Signed)
Access Code: [facility] URL: [email or web address] Date: 08/23/2020 Prepared by: Baldomero Lamy  Exercises Clamshell with Resistance - 2 x daily - 5 x weekly - 2 sets - 8 reps Bridge - 2 x daily - 5 x weekly - 2 sets - 5 reps Forward Step Touch - 2 x daily - 5 x weekly - 2 sets - 10 reps Romberg Stance Eyes Closed on Foam Pad - 2 x daily - 5 x weekly - 3 sets - 10 reps Backward Walking with Counter Support - 2 x daily - 5 x weekly - 3 sets Tall Kneeling Posterior Pelvic Tilt - 1 x daily - 5 x weekly - 2 sets - 10 reps Half Kneeling Chop - 1 x daily - 5 x weekly - 2 sets - 10 reps Standing Toe Dorsiflexion Stretch - 1 x daily - 5 x weekly - 1 sets - 3 reps - 30 seconds hold Single Leg Stance with Support - 1 x daily - 5 x weekly - 1 sets - 3 reps - 15 seconds hold

## 2020-08-26 ENCOUNTER — Ambulatory Visit: Payer: PRIVATE HEALTH INSURANCE | Admitting: Physical Therapy

## 2020-08-31 ENCOUNTER — Ambulatory Visit: Payer: PRIVATE HEALTH INSURANCE | Admitting: Physical Therapy

## 2020-08-31 ENCOUNTER — Encounter: Payer: Self-pay | Admitting: Physical Therapy

## 2020-08-31 ENCOUNTER — Other Ambulatory Visit: Payer: Self-pay

## 2020-08-31 DIAGNOSIS — M6281 Muscle weakness (generalized): Secondary | ICD-10-CM

## 2020-08-31 DIAGNOSIS — R2689 Other abnormalities of gait and mobility: Secondary | ICD-10-CM

## 2020-08-31 DIAGNOSIS — R2681 Unsteadiness on feet: Secondary | ICD-10-CM

## 2020-08-31 NOTE — Therapy (Signed)
[facility] [address] [place], [place], [address] Phone: [phone]   Fax:  [phone]  Physical Therapy Treatment  Patient Details  Name: Samantha Church MRN: [id number] Date of Birth: [birth year] Referring Provider (PT): Felecia Shelling, Nanine Means, MD   Encounter Date: 08/31/2020   PT End of Session - 08/31/20 1011     Visit Number 22    Number of Visits 24    Date for PT Re-Evaluation 09/05/20    Authorization Type [employer] 2022, 60 VL combined (PT, OT, ST)    Authorization - Visit Number 22    Authorization - Number of Visits 60    PT Start Time 0930    PT Stop Time 1009    PT Time Calculation (min) 39 min    Equipment Utilized During Treatment Gait belt    Activity Tolerance Patient tolerated treatment well    Behavior During Therapy WFL for tasks assessed/performed             Past Medical History:  Diagnosis Date   Anemia    Hypothyroidism    Dr. [name]   Lyme disease    Multiple sclerosis ([place])    Neuromuscular disorder ([place])    MS   Vision abnormalities     Past Surgical History:  Procedure Laterality Date   AUGMENTATION MAMMAPLASTY Bilateral 2003   saline   BREAST IMPLANT REMOVAL Bilateral 02/17/2019   BREAST SURGERY  2002   COLONOSCOPY  2008   DILATATION & CURRETTAGE/HYSTEROSCOPY WITH RESECTOCOPE N/A 10/09/2012   Procedure: DILATATION & CURETTAGE/HYSTEROSCOPY WITH RESECTOCOPE;  Surgeon: [name], MD;  Location: [facility];  Service: Gynecology;  Laterality: N/A;  RESECTION with Versapoint    fibroid  2019   fibroidectomy   KNEE ARTHROSCOPY  1996   KNEE ARTHROSCOPY  1996    There were no vitals filed for this visit.   Subjective Assessment - 08/31/20 0931     Subjective No changes since she was last here.    Pertinent History relapsing MS (diagnosed 2007), hypothyroidism, knee arthroscopy, Lyme disease, status post Morton's neurectomy and Weil osteotomy of the second and third  metatarsal right foot (12/2019).    Limitations Walking    How long can you walk comfortably? 10 minutes    Patient Stated Goals wants to be able to walk without any assistance, improve confidence when walking    Currently in Pain? No/denies                [facility] PT Assessment - 08/31/20 0933       6 Minute Walk- Baseline   6 Minute Walk- Baseline yes    HR (bpm) 67    02 Sat (%RA) 99 %    Modified Borg Scale for Dyspnea 0- Nothing at all      6 Minute walk- Post Test   HR (bpm) 74    02 Sat (%RA) 100 %    Modified Borg Scale for Dyspnea 0.5- Very, very slight shortness of breath    Perceived Rate of Exertion (Borg) 11- Fairly light      6 minute walk test results    Aerobic Endurance Distance Walked 1272 - performed with single walking pole    Endurance additional comments rates 3/10 fatigue      Functional Gait  Assessment   Gait assessed  Yes    Gait Level Surface Walks 20 ft in less than 7 sec but greater than 5.5 sec, uses assistive device, slower speed,  mild gait deviations, or deviates 6-10 in outside of the 12 in walkway width.   6.35   Change in Gait Speed Able to smoothly change walking speed without loss of balance or gait deviation. Deviate no more than 6 in outside of the 12 in walkway width.    Gait with Horizontal Head Turns Performs head turns smoothly with no change in gait. Deviates no more than 6 in outside 12 in walkway width    Gait with Vertical Head Turns Performs head turns with no change in gait. Deviates no more than 6 in outside 12 in walkway width.    Gait and Pivot Turn Pivot turns safely within 3 sec and stops quickly with no loss of balance.    Step Over Obstacle Is able to step over 2 stacked shoe boxes taped together (9 in total height) without changing gait speed. No evidence of imbalance.    Gait with Narrow Base of Support Ambulates less than 4 steps heel to toe or cannot perform without assistance.    Gait with Eyes Closed Walks 20 ft, slow  speed, abnormal gait pattern, evidence for imbalance, deviates 10-15 in outside 12 in walkway width. Requires more than 9 sec to ambulate 20 ft.   9.9 seconds   Ambulating Backwards Walks 20 ft, uses assistive device, slower speed, mild gait deviations, deviates 6-10 in outside 12 in walkway width.   13.6   Steps Alternating feet, no rail.    Total Score 23    FGA comment: 23/30 = medium fall risk                           [facility] Adult PT Treatment/Exercise - 08/31/20 0957       Ambulation/Gait   Ambulation/Gait Yes    Ambulation/Gait Assistance 5: Supervision    Ambulation/Gait Assistance Details on outdoor grass/paved surfaces with no walking pole   Ambulation Distance (Feet) 350 Feet    Assistive device None    Gait Pattern Decreased stance time - left;Decreased weight shift to left;Trendelenburg;Decreased hip/knee flexion - left;Left circumduction    Ambulation Surface Level;Indoor;Unlevel;Outdoor;Paved;Grass    Curb 5: Supervision    Curb Details (indicate cue type and reason) x2 reps, pt leading with LLE stepping up onto curb for improved foot clearance with no AD                 Balance Exercises - 08/31/20 1007       Balance Exercises: Standing   SLS with Vectors Upper extremity assist 1;Foam/compliant surface    SLS with Vectors Limitations standing on blue mat: with LLE as stance leg stepping RLE over smaller orange hurdle and back to midline x10 reps, cues for glute/core activation    Step Over Hurdles / Cones on blue mat: alternating forward stepping over 2 smaller and 1 larger orange hurdle, then performing with lateral stepping over with step to pattern, intermittent UE support needed and min guard for balance. Cues for LLE hip/knee flexion when clearing and quad/glute activation on LLE               PT Education - 08/31/20 1010     Education Details results of LTGs    Person(s) Educated Patient    Methods Explanation    Comprehension  Verbalized understanding              PT Short Term Goals - 08/06/20 1335       PT SHORT  TERM GOAL #1   Title ALL STGS = LTGS               PT Long Term Goals - 08/31/20 0943       PT LONG TERM GOAL #1   Title Pt will be independent with final HEP and walking program in order to build upon functional gains made in therapy. ALL LTGS DUE 09/03/20    Time 4    Period Weeks    Status Revised      PT LONG TERM GOAL #2   Title Pt will improve 6MWT distance by at least 100' with single walking pole and rating fatigue as a 1-2/10 or less in order to demo improved walking endurance.    Baseline 1264' on 08/02/20 with 3/10 fatigue rating, 1272' on 08/31/20 with 3/10 fatigue rating    Time 4    Period Weeks    Status Not Met      PT LONG TERM GOAL #3   Title Pt will improve FGA score to at least a 24/30 in order to demo decr fall risk.    Baseline 22/30; 23/30 on 08/31/20    Time 4    Period Weeks    Status Not Met      PT LONG TERM GOAL #4   Title pt will ambulate at least 300' outdoors with no AD and perform a curb with supervision in order to demo improved gait for smaller community distances.    Baseline met on 08/31/20    Time 4    Period Weeks    Status Achieved                   Plan - 08/31/20 1014     Clinical Impression Statement Today's skilled session focused on beginning to check pt's LTGs. Pt met LTG #4 in regards to outdoor gait and performing a curb with no walking pole and supervision. Pt did not meet LTG #2 and #3. Performed the 6MWT with single walking pole and ambulated 1272' (previously 1264' a month ago). Pt improved FGA score to a 23/30 (improved from 22/30), but not quite to goal level. Pt is pleased with her progress with PT, will review HEP/walking program next session and plan for D/C.    Personal Factors and Comorbidities Comorbidity 3+;Time since onset of injury/illness/exacerbation;Past/Current Experience    Comorbidities relapsing MS  (diagnosed 2007), hypothyroidism, knee arthroscopy, Lyme disease, status post [facility]'s neurectomy and Weil osteotomy of the second and third metatarsal right foot (12/2019)    Examination-Activity Limitations Stairs;Transfers;Squat;Locomotion Level    Examination-Participation Restrictions Community Activity;Cleaning    Stability/Clinical Decision Making Stable/Uncomplicated    Rehab Potential Good    PT Frequency 2x / week    PT Duration 4 weeks    PT Treatment/Interventions ADLs/Self Care Home Management;Aquatic Therapy;Stair training;Gait training;DME Instruction;Functional mobility training;Therapeutic activities;Therapeutic exercise;Balance training;Neuromuscular re-education;Patient/family education;Orthotic Fit/Training;Passive range of motion;Vestibular    PT Next Visit Plan finalize HEP and D/C.    PT Home Exercise Plan [facility]    Consulted and Agree with Plan of Care Patient             Patient will benefit from skilled therapeutic intervention in order to improve the following deficits and impairments:  Abnormal gait, Decreased activity tolerance, Decreased coordination, Decreased balance, Decreased endurance, Decreased range of motion, Decreased strength, Dizziness, Impaired sensation  Visit Diagnosis: Unsteadiness on feet  Other abnormalities of gait and mobility  Muscle weakness (generalized)     Problem  List Patient Active Problem List   Diagnosis Date Noted   History of Lyme disease 06/22/2020   Pre-operative clearance 12/03/2018   High risk medication use 07/25/2018   Well woman exam without gynecological exam 11/21/2017   Left leg weakness 10/14/2015   Hypothyroidism (acquired) 11/03/2014   Other fatigue 11/03/2014   Gait disorder 11/03/2014   MULTIPLE SCLEROSIS 09/01/2008    Arliss Journey, PT, DPT  08/31/2020, 10:16 AM  [facility] [address] [place], [place], [address] Phone:  [phone]   Fax:  [phone]  Name: Samantha Church MRN: [id number] Date of Birth: 1960-12-13

## 2020-09-02 ENCOUNTER — Encounter: Payer: Self-pay | Admitting: Physical Therapy

## 2020-09-02 ENCOUNTER — Ambulatory Visit: Payer: PRIVATE HEALTH INSURANCE | Admitting: Physical Therapy

## 2020-09-02 ENCOUNTER — Other Ambulatory Visit: Payer: Self-pay

## 2020-09-02 DIAGNOSIS — M6281 Muscle weakness (generalized): Secondary | ICD-10-CM

## 2020-09-02 DIAGNOSIS — R2689 Other abnormalities of gait and mobility: Secondary | ICD-10-CM

## 2020-09-02 DIAGNOSIS — R2681 Unsteadiness on feet: Secondary | ICD-10-CM

## 2020-09-02 NOTE — Therapy (Signed)
[facility] [address] [place], [place], [address] Phone: [phone]   Fax:  [phone]  Physical Therapy Treatment/Discharge Summary  Patient Details  Name: Samantha Church MRN: [id number] Date of Birth: Jun 06, 1960 Referring Provider (PT): Felecia Shelling, Nanine Means, MD   Encounter Date: 09/02/2020   PT End of Session - 09/02/20 1002     Visit Number 23    Number of Visits 24    Date for PT Re-Evaluation 09/05/20    Authorization Type [employer] 2022, 60 VL combined (PT, OT, ST)    Authorization - Visit Number 23    Authorization - Number of Visits 65    PT Start Time 0934    PT Stop Time 0958   full time not needed due to D/C visit   PT Time Calculation (min) 24 min    Activity Tolerance Patient tolerated treatment well    Behavior During Therapy [facility] for tasks assessed/performed             Past Medical History:  Diagnosis Date   Anemia    Hypothyroidism    Dr. [name]   Lyme disease    Multiple sclerosis ([place])    Neuromuscular disorder ([place])    MS   Vision abnormalities     Past Surgical History:  Procedure Laterality Date   AUGMENTATION MAMMAPLASTY Bilateral 2003   saline   BREAST IMPLANT REMOVAL Bilateral 02/17/2019   BREAST SURGERY  2002   COLONOSCOPY  2008   DILATATION & CURRETTAGE/HYSTEROSCOPY WITH RESECTOCOPE N/A 10/09/2012   Procedure: DILATATION & CURETTAGE/HYSTEROSCOPY WITH RESECTOCOPE;  Surgeon: [name], MD;  Location: [facility];  Service: Gynecology;  Laterality: N/A;  RESECTION with Versapoint    fibroid  2019   fibroidectomy   KNEE ARTHROSCOPY  1996   KNEE ARTHROSCOPY  1996    There were no vitals filed for this visit.   Subjective Assessment - 09/02/20 0936     Subjective Doing well, no changes.    Pertinent History relapsing MS (diagnosed 2007), hypothyroidism, knee arthroscopy, Lyme disease, status post [facility]'s neurectomy and Weil osteotomy of the second and third metatarsal  right foot (12/2019).    Limitations Walking    How long can you walk comfortably? 10 minutes    Patient Stated Goals wants to be able to walk without any assistance, improve confidence when walking    Currently in Pain? No/denies                               Access Code: [facility] URL: [email or web address] Date: 09/02/2020 Prepared by: Janann August  Reviewed final HEP with no issues. See [employer] for further details.   Exercises Clamshell with Resistance - 2 x daily - 5 x weekly - 2 sets - 8-10 reps Bridge - 2 x daily - 5 x weekly - 2 sets - 5 reps Forward Step Touch - 2 x daily - 5 x weekly - 2 sets - 10 reps Tall Kneeling Posterior Pelvic Tilt - 1 x daily - 5 x weekly - 2 sets - 10 reps Half Kneeling Chop - 1 x daily - 5 x weekly - 2 sets - 10 reps Standing Toe Dorsiflexion Stretch - 1 x daily - 5 x weekly - 1 sets - 3 reps - 30 seconds hold Single Leg Stance with Support - 1 x daily - 5 x weekly - 1 sets - 3 reps - 15 seconds hold Standing  Balance with Eyes Closed on Foam - 1 x daily - 5 x weekly - 2 sets - 10 reps - with feet apart, x10 reps head turns, x10 reps head nods          PT Education - 09/02/20 1002     Education Details final HEP, if pt has worsening balance/strength in the future then would need a new referral from her PCP/neurologist to return    Person(s) Educated Patient    Methods Explanation;Demonstration    Comprehension Verbalized understanding;Returned demonstration              PHYSICAL THERAPY DISCHARGE SUMMARY  Visits from Start of Care: 23  Current functional level related to goals / functional outcomes: See LTGs   Remaining deficits: Decr LLE strength, impaired balance, gait abnormalities   Education / Equipment: HEP   Patient agrees to discharge. Patient goals were met/partially met. Patient is being discharged due to meeting the stated rehab goals and being pleased with current  functional level.    PT Short Term Goals - 08/06/20 1335       PT SHORT TERM GOAL #1   Title ALL STGS = LTGS               PT Long Term Goals - 08/31/20 0943       PT LONG TERM GOAL #1   Title Pt will be independent with final HEP and walking program in order to build upon functional gains made in therapy. ALL LTGS DUE 09/03/20    Time 4    Period Weeks    Status Revised      PT LONG TERM GOAL #2   Title Pt will improve 6MWT distance by at least 100' with single walking pole and rating fatigue as a 1-2/10 or less in order to demo improved walking endurance.    Baseline 1264' on 08/02/20 with 3/10 fatigue rating, 1272' on 08/31/20 with 3/10 fatigue rating    Time 4    Period Weeks    Status Not Met      PT LONG TERM GOAL #3   Title Pt will improve FGA score to at least a 24/30 in order to demo decr fall risk.    Baseline 22/30; 23/30 on 08/31/20    Time 4    Period Weeks    Status Not Met      PT LONG TERM GOAL #4   Title pt will ambulate at least 300' outdoors with no AD and perform a curb with supervision in order to demo improved gait for smaller community distances.    Baseline met on 08/31/20    Time 4    Period Weeks    Status Achieved                   Plan - 09/02/20 1004     Clinical Impression Statement Today's skilled session focused on reviewing and finalizing pt's HEP for D/C for ROM, strength, and balance. Pt tolerated well and able to demo understanding of exercises. Able to progress eyes closed with head turns. Due to progress towards goals and pt being pleased with current functional status, will D/C at this time. Pt in agreement with plan.    Personal Factors and Comorbidities Comorbidity 3+;Time since onset of injury/illness/exacerbation;Past/Current Experience    Comorbidities relapsing MS (diagnosed 2007), hypothyroidism, knee arthroscopy, Lyme disease, status post Morton's neurectomy and Weil osteotomy of the second and third metatarsal  right foot (12/2019)  Examination-Activity Limitations Stairs;Transfers;Squat;Locomotion Level    Examination-Participation Restrictions Community Activity;Cleaning    Stability/Clinical Decision Making Stable/Uncomplicated    Rehab Potential Good    PT Frequency 2x / week    PT Duration 4 weeks    PT Treatment/Interventions ADLs/Self Care Home Management;Aquatic Therapy;Stair training;Gait training;DME Instruction;Functional mobility training;Therapeutic activities;Therapeutic exercise;Balance training;Neuromuscular re-education;Patient/family education;Orthotic Fit/Training;Passive range of motion;Vestibular    PT Home Exercise Plan [facility]    Consulted and Agree with Plan of Care Patient             Patient will benefit from skilled therapeutic intervention in order to improve the following deficits and impairments:  Abnormal gait, Decreased activity tolerance, Decreased coordination, Decreased balance, Decreased endurance, Decreased range of motion, Decreased strength, Dizziness, Impaired sensation  Visit Diagnosis: Unsteadiness on feet  Other abnormalities of gait and mobility  Muscle weakness (generalized)     Problem List Patient Active Problem List   Diagnosis Date Noted   History of Lyme disease 06/22/2020   Pre-operative clearance 12/03/2018   High risk medication use 07/25/2018   Well woman exam without gynecological exam 11/21/2017   Left leg weakness 10/14/2015   Hypothyroidism (acquired) 11/03/2014   Other fatigue 11/03/2014   Gait disorder 11/03/2014   MULTIPLE SCLEROSIS 09/01/2008    Arliss Journey, PT ,DPT  09/02/2020, 10:06 AM  [facility] [address] [place], [place], [address] Phone: [phone]   Fax:  [phone]  Name: Samantha Church MRN: [id number] Date of Birth: 07-10-1960

## 2020-09-02 NOTE — Patient Instructions (Signed)
Access Code: [facility] URL: [email or web address] Date: 09/02/2020 Prepared by: Janann August  Exercises Clamshell with Resistance - 2 x daily - 5 x weekly - 2 sets - 8-10 reps Bridge - 2 x daily - 5 x weekly - 2 sets - 5 reps Forward Step Touch - 2 x daily - 5 x weekly - 2 sets - 10 reps Tall Kneeling Posterior Pelvic Tilt - 1 x daily - 5 x weekly - 2 sets - 10 reps Half Kneeling Chop - 1 x daily - 5 x weekly - 2 sets - 10 reps Standing Toe Dorsiflexion Stretch - 1 x daily - 5 x weekly - 1 sets - 3 reps - 30 seconds hold Single Leg Stance with Support - 1 x daily - 5 x weekly - 1 sets - 3 reps - 15 seconds hold Standing Balance with Eyes Closed on Foam - 1 x daily - 5 x weekly - 2 sets - 10 reps

## 2020-10-06 ENCOUNTER — Other Ambulatory Visit: Payer: Self-pay

## 2020-10-06 ENCOUNTER — Ambulatory Visit
Admission: RE | Admit: 2020-10-06 | Discharge: 2020-10-06 | Disposition: A | Discharge status: Home / Self Care | Payer: PRIVATE HEALTH INSURANCE | Source: Ambulatory Visit | Attending: Emergency Medicine | Admitting: Emergency Medicine

## 2020-10-06 VITALS — BP 118/81 | HR 68 | Temp 97.8°F | Resp 16

## 2020-10-06 DIAGNOSIS — H6123 Impacted cerumen, bilateral: Secondary | ICD-10-CM

## 2020-10-06 DIAGNOSIS — H6983 Other specified disorders of Eustachian tube, bilateral: Secondary | ICD-10-CM

## 2020-10-06 DIAGNOSIS — H6993 Unspecified Eustachian tube disorder, bilateral: Secondary | ICD-10-CM

## 2020-10-06 MED ORDER — FLUTICASONE PROPIONATE 50 MCG/ACT NA SUSP: 1.0000 | Freq: Every day | NASAL | 0 refills | Status: DC

## 2020-10-06 MED ORDER — PREDNISONE 20 MG PO TABS: 40.0000 mg | ORAL_TABLET | Freq: Every day | ORAL | 0 refills | Status: AC

## 2020-10-06 NOTE — Discharge Instructions (Addendum)
Earwax removed from ears, may use over-the-counter Debrox for further removal of wax Use Flonase nasal spray 1 to 2 spray in each nostril daily over the next 1 to 2 weeks If still not having any improvement may trial course of prednisone

## 2020-10-06 NOTE — ED Triage Notes (Signed)
Pt reports having bilateral ear pain (pt states ears feel clogged and she hears a clicking sound). This morning patient states she woke up with with an ear ache.

## 2020-10-07 NOTE — ED Provider Notes (Signed)
[facility]    CSN: [id number] Arrival date & time: 10/06/20  1322      History   Chief Complaint Chief Complaint  Patient presents with   Otalgia    HPI [place] is a 60 y.o. female presenting today for evaluation of bilateral ear discomfort.  Reports that over the past year she frequently has a clogged/bubblelike sensation to her ears.  This morning she woke up with increased pain.  She denies any recent URI symptoms or congestion.  HPI  Past Medical History:  Diagnosis Date   Anemia    Hypothyroidism    Dr. [name]   Lyme disease    Multiple sclerosis ([place])    Neuromuscular disorder ([place])    MS   Vision abnormalities     Patient Active Problem List   Diagnosis Date Noted   History of Lyme disease 06/22/2020   Pre-operative clearance 12/03/2018   High risk medication use 07/25/2018   Well woman exam without gynecological exam 11/21/2017   Left leg weakness 10/14/2015   Hypothyroidism (acquired) 11/03/2014   Other fatigue 11/03/2014   Gait disorder 11/03/2014   MULTIPLE SCLEROSIS 09/01/2008    Past Surgical History:  Procedure Laterality Date   AUGMENTATION MAMMAPLASTY Bilateral 2003   saline   BREAST IMPLANT REMOVAL Bilateral 02/17/2019   BREAST SURGERY  2002   COLONOSCOPY  2008   DILATATION & CURRETTAGE/HYSTEROSCOPY WITH RESECTOCOPE N/A 10/09/2012   Procedure: DILATATION & CURETTAGE/HYSTEROSCOPY WITH RESECTOCOPE;  Surgeon: [name], MD;  Location: [facility];  Service: Gynecology;  Laterality: N/A;  RESECTION with Versapoint    fibroid  2019   fibroidectomy   KNEE ARTHROSCOPY  1996   KNEE ARTHROSCOPY  1996    OB History   No obstetric history on file.      Home Medications    Prior to Admission medications   Medication Sig Start Date End Date Taking? Authorizing Provider  fluticasone (FLONASE) 50 MCG/ACT nasal spray Place 1-2 sprays into both nostrils daily. 10/06/20  Yes Shavette Shoaff C, PA-C  predniSONE (DELTASONE) 20 MG  tablet Take 2 tablets (40 mg total) by mouth daily with breakfast for 5 days. 10/06/20 10/11/20 Yes Akelia Husted C, PA-C  Alpha-Lipoic Acid 600 MG TABS  08/09/17   [provider]  Ascorbic Acid 500 MG/15ML SYRP Take by mouth.    [provider]  Biotin 100 MG/GM POWD  08/09/17   [provider]  Cholecalciferol (VITAMIN D-3) 5000 units TABS Take 1 tablet by mouth daily.    [provider]  Dimethyl Fumarate (TECFIDERA) 240 MG CPDR Take 1 capsule (240 mg total) by mouth 2 (two) times daily. 07/02/20   Sater, Nanine Means, MD  Magnesium (OPTIMAG 125) 125 MG CAPS Take 2 capsules by mouth daily.    [provider]  Methylcobalamin 1 MG CHEW     [provider]  Multiple Vitamins-Minerals (MULTIVITAMIN ADULT PO) Take by mouth.    [provider]  NP THYROID 60 MG tablet TAKE ONE TABLET BY MOUTH DAILY BEFORE BREAKFAST 04/15/20   Philemon Kingdom, MD  Nutritional Supplements (OPTICLEANSE GHI PO) Take by mouth.    [provider]  Turmeric POWD by Does not apply route.    [provider]    Family History Family History  Problem Relation Age of Onset   Hypertension Mother    Arthritis Mother    Colon cancer Father    Thyroid cancer Sister    Dementia Brother  Early   Heart attack Maternal Grandmother    Hypertension Maternal Grandmother    CAD Maternal Grandmother    Diabetes Maternal Grandfather    Lung cancer Maternal Grandfather    Alzheimer's disease Paternal Grandmother    Hypertension Paternal Grandmother    Hyperlipidemia Paternal Grandmother    Diabetes Paternal Grandfather    Asthma Brother    Thyroid cancer Brother     Social History Social History   Tobacco Use   Smoking status: Never   Smokeless tobacco: Never  Vaping Use   Vaping Use: Never used  Substance Use Topics   Alcohol use: Yes    Comment: 2x month   Drug use: No     Allergies   Patient has no known allergies.   Review  of Systems Review of Systems  Constitutional:  Negative for activity change, appetite change, chills, fatigue and fever.  HENT:  Positive for ear pain and hearing loss. Negative for congestion, rhinorrhea, sinus pressure, sore throat and trouble swallowing.   Eyes:  Negative for discharge and redness.  Respiratory:  Negative for cough, chest tightness and shortness of breath.   Cardiovascular:  Negative for chest pain.  Gastrointestinal:  Negative for abdominal pain, diarrhea, nausea and vomiting.  Musculoskeletal:  Negative for myalgias.  Skin:  Negative for rash.  Neurological:  Negative for dizziness, light-headedness and headaches.    Physical Exam Triage Vital Signs ED Triage Vitals  Enc Vitals Group     BP 10/06/20 1355 118/81     Pulse Rate 10/06/20 1355 68     Resp 10/06/20 1355 16     Temp 10/06/20 1355 97.8 F (36.6 C)     Temp Source 10/06/20 1355 Oral     SpO2 10/06/20 1355 98 %     Weight --      Height --      Head Circumference --      Peak Flow --      Pain Score 10/06/20 1354 5     Pain Loc --      Pain Edu? --      Excl. in [place]? --    No data found.  Updated Vital Signs BP 118/81 (BP Location: Left Arm)   Pulse 68   Temp 97.8 F (36.6 C) (Oral)   Resp 16   LMP 03/01/2016   SpO2 98%   Visual Acuity Right Eye Distance:   Left Eye Distance:   Bilateral Distance:    Right Eye Near:   Left Eye Near:    Bilateral Near:     Physical Exam Vitals and nursing note reviewed.  Constitutional:      Appearance: She is well-developed.     Comments: No acute distress  HENT:     Head: Normocephalic and atraumatic.     Ears:     Comments: Cerumen occluding visualization of majority of TMs initially, after removal, TMs intact with good bony landmarks, good cone of light and pearly gray    Nose: Nose normal.     Mouth/Throat:     Comments: Oral mucosa pink and moist, no tonsillar enlargement or exudate. Posterior pharynx patent and nonerythematous, no  uvula deviation or swelling. Normal phonation.  Eyes:     Conjunctiva/sclera: Conjunctivae normal.  Cardiovascular:     Rate and Rhythm: Normal rate.  Pulmonary:     Effort: Pulmonary effort is normal. No respiratory distress.  Abdominal:     General: There is no distension.  Musculoskeletal:  General: Normal range of motion.     Cervical back: Neck supple.  Skin:    General: Skin is warm and dry.  Neurological:     Mental Status: She is alert and oriented to person, place, and time.     UC Treatments / Results  Labs (all labs ordered are listed, but only abnormal results are displayed) Labs Reviewed - No data to display  EKG   Radiology No results found.  Procedures Procedures (including critical care time)  Medications Ordered in UC Medications - No data to display  Initial Impression / Assessment and Plan / UC Course  I have reviewed the triage vital signs and the nursing notes.  Pertinent labs & imaging results that were available during my care of the patient were reviewed by me and considered in my medical decision making (see chart for details).     Cerumen removal by curette by myself, after removal TMs better visualized and intact, no sign of otitis media, given description of symptoms suspect likely eustachian tube dysfunction contributing to symptoms, will initiate on Flonase, escalate to prednisone if still not improving.  Follow-up with ENT if persistent as well.  Discussed strict return precautions. Patient verbalized understanding and is agreeable with plan.  Final Clinical Impressions(s) / UC Diagnoses   Final diagnoses:  Dysfunction of both eustachian tubes  Bilateral impacted cerumen     Discharge Instructions      Earwax removed from ears, may use over-the-counter Debrox for further removal of wax Use Flonase nasal spray 1 to 2 spray in each nostril daily over the next 1 to 2 weeks If still not having any improvement may trial  course of prednisone   ED Prescriptions     Medication Sig Dispense Auth. Provider   fluticasone (FLONASE) 50 MCG/ACT nasal spray Place 1-2 sprays into both nostrils daily. 16 g Corneshia Hines C, PA-C   predniSONE (DELTASONE) 20 MG tablet Take 2 tablets (40 mg total) by mouth daily with breakfast for 5 days. 10 tablet [place] Deleo, [place] C, PA-C      PDMP not reviewed this encounter.   Janith Lima, [place] 10/07/20 [phone]

## 2020-11-07 ENCOUNTER — Encounter: Payer: Self-pay | Admitting: Family Medicine

## 2020-12-13 ENCOUNTER — Encounter: Payer: Self-pay | Admitting: Neurology

## 2020-12-15 NOTE — Telephone Encounter (Signed)
Unable to initiate PA on CMM. I was able to locate and print PA form offline. Currently in process of completing.

## 2021-01-11 ENCOUNTER — Other Ambulatory Visit: Payer: Self-pay | Admitting: *Deleted

## 2021-01-11 DIAGNOSIS — G35 Multiple sclerosis: Secondary | ICD-10-CM

## 2021-01-11 MED ORDER — DIMETHYL FUMARATE 240 MG PO CPDR: 240.0000 mg | DELAYED_RELEASE_CAPSULE | Freq: Two times a day (BID) | ORAL | 3 refills | Status: DC

## 2021-01-14 ENCOUNTER — Other Ambulatory Visit: Payer: Self-pay

## 2021-01-17 ENCOUNTER — Ambulatory Visit (INDEPENDENT_AMBULATORY_CARE_PROVIDER_SITE_OTHER): Payer: No Typology Code available for payment source | Admitting: Family Medicine

## 2021-01-17 ENCOUNTER — Other Ambulatory Visit: Payer: Self-pay

## 2021-01-17 ENCOUNTER — Encounter: Payer: Self-pay | Admitting: Family Medicine

## 2021-01-17 ENCOUNTER — Ambulatory Visit (INDEPENDENT_AMBULATORY_CARE_PROVIDER_SITE_OTHER): Payer: No Typology Code available for payment source

## 2021-01-17 VITALS — BP 116/74 | HR 70 | Temp 97.7°F | Ht 66.0 in | Wt 141.6 lb

## 2021-01-17 DIAGNOSIS — G35 Multiple sclerosis: Secondary | ICD-10-CM | POA: Diagnosis not present

## 2021-01-17 DIAGNOSIS — E785 Hyperlipidemia, unspecified: Secondary | ICD-10-CM

## 2021-01-17 DIAGNOSIS — Z23 Encounter for immunization: Secondary | ICD-10-CM | POA: Diagnosis not present

## 2021-01-17 DIAGNOSIS — E559 Vitamin D deficiency, unspecified: Secondary | ICD-10-CM | POA: Insufficient documentation

## 2021-01-17 DIAGNOSIS — H6993 Unspecified Eustachian tube disorder, bilateral: Secondary | ICD-10-CM

## 2021-01-17 DIAGNOSIS — M545 Low back pain, unspecified: Secondary | ICD-10-CM | POA: Diagnosis not present

## 2021-01-17 DIAGNOSIS — E039 Hypothyroidism, unspecified: Secondary | ICD-10-CM | POA: Diagnosis not present

## 2021-01-17 NOTE — Progress Notes (Signed)
[facility] [address] [place] [address] Dept: [phone] Dept Fax: [phone]  Transfer of Care Office Visit  Subjective:    Patient ID: Samantha [place], female    DOB: [birth year], 61 y.o..   MRN: [id number]  Chief Complaint  Patient presents with   Establish Care    Samantha [place]- establish care.  C/o having swelling area on Lower back and spot on LT ankle.   Nothing to eat since 5:30 am.     History of Present Illness:  Patient is in today to establish care. [place] in [place] area. Her husband was int he army and they lived for 3 years in [place]. They moved back to [place] when her husband went into a family business. 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 She and her husband have been married for 38 years. They have 4 children (ages 55-26). She denies tobacco and drug use. She drinks wine 1-2 times a week.  [place]antha [place] has a history of multiple sclerosis. She was diagnosed in 2007 after she had sudden onset of right sided numbness and altered sensation. She sees Dr. Felecia Shelling (neurology). He treats her with dimethyl fumurate. Additionally, she is on alpha lipoic acid and a number of other vitamin and mineral supplements. She has some constipation at times, so uses Metamucil gummies for this.  [place]antha [place] has a history of hypothyroidism. She is managed on NP Thyroid (desiccated thyroid). She does take biotin and knows she needs to let this wash out of her system prior to thyroid testing.  Samantha [place] has a history of borderline hyperlipidemia.  [place]antha [place] notes that she has had issues with clicking in her ears for the past 2 years. At one point, she was placed on Flonase to see if this would resolve the issue. She also associates this with an issue she has in hearing in crowded situations.  [place]antha [place]  also notes a 2 month history of right lower back pain. She does not recall a specific injury, but thinks this may have been due to twisting. She denies any recent fever, Samantha areas of numbness or weakness in her legs, and has not had any urinary or fecal incontinence. She has lost weight, but felt this was due to cutting out bread and alcohol from her diet.  Past Medical History: Patient Active Problem List   Diagnosis Date Noted   Vitamin D deficiency 01/17/2021   Borderline hyperlipidemia 01/17/2021   History of Lyme disease 06/22/2020   High risk medication use 07/25/2018   Left leg weakness 10/14/2015   Hypothyroidism (acquired) 11/03/2014   Other fatigue 11/03/2014   Gait disorder 11/03/2014   Multiple sclerosis ([place]) 09/01/2008   Past Surgical History:  Procedure Laterality Date   AUGMENTATION MAMMAPLASTY Bilateral 2003   saline   BREAST IMPLANT REMOVAL Bilateral 02/17/2019   BREAST SURGERY  2002   COLONOSCOPY  2008   DILATATION & CURRETTAGE/HYSTEROSCOPY WITH RESECTOCOPE N/A 10/09/2012   Procedure: DILATATION & CURETTAGE/HYSTEROSCOPY WITH RESECTOCOPE;  Surgeon: [name], MD;  Location: [facility];  Service: Gynecology;  Laterality: N/A;  RESECTION with Versapoint    KNEE ARTHROSCOPY  [facility]  2019   fibroidectomy   Family History  Problem Relation Age of Onset   Alzheimer's disease Mother    Stroke  Mother    Hypertension Mother    Arthritis Mother    Colon cancer Father    Thyroid cancer Sister    Dementia Brother        Early   Asthma Brother    Thyroid cancer Brother    Cancer Paternal Aunt        Brain   Cancer Paternal Uncle        Brain   Heart attack Maternal Grandmother    Hypertension Maternal Grandmother    CAD Maternal Grandmother    Diabetes Maternal Grandfather    Lung cancer Maternal Grandfather    Alzheimer's disease Paternal Grandmother    Hypertension Paternal Grandmother    Hyperlipidemia Paternal Grandmother     Diabetes Paternal Grandfather    Outpatient Medications Prior to Visit  Medication Sig Dispense Refill   Alpha-Lipoic Acid 600 MG TABS      Ascorbic Acid 500 MG/15ML SYRP Take by mouth.     Biotin 100 MG/GM POWD      Cholecalciferol (VITAMIN D-3) 5000 units TABS Take 1 tablet by mouth daily.     Dimethyl Fumarate (TECFIDERA) 240 MG CPDR Take 1 capsule (240 mg total) by mouth 2 (two) times daily. 180 capsule 3   fluticasone (FLONASE) 50 MCG/ACT nasal spray Place 1-2 sprays into both nostrils daily. 16 g 0   Magnesium (OPTIMAG 125) 125 MG CAPS Take 2 capsules by mouth daily.     Multiple Vitamins-Minerals (MULTIVITAMIN ADULT PO) Take by mouth.     NP THYROID 60 MG tablet TAKE ONE TABLET BY MOUTH DAILY BEFORE BREAKFAST 90 tablet 3   Nutritional Supplements (OPTICLEANSE GHI PO) Take by mouth.     Methylcobalamin 1 MG CHEW      Turmeric POWD by Does not apply route.     No facility-administered medications prior to visit.   No Known Allergies    Objective:   Today's Vitals   01/17/21 1302  BP: 116/74  Pulse: 70  Temp: 97.7 F (36.5 C)  TempSrc: Temporal  SpO2: 98%  Weight: 141 lb 9.6 oz (64.2 kg)  Height: 5\' 6"  (1.676 m)   Body mass index is 22.85 kg/m.   General: Well developed, well nourished. No acute distress. Ears: External ear normal. Both EACs with some wax. Right TM difficult to see, but left TM is normal. Back: Straight. There is an area of redness and mild swelling over the lower right paralumbar muscle   column. Psych: Alert and oriented. Normal mood and affect.  Health Maintenance Due  Topic Date Due   Pneumococcal Vaccine 61-72 Years old (1 - PCV) Never done   Zoster Vaccines- Shingrix (1 of 2) Never done   TETANUS/TDAP  11/23/2020   COVID-19 Vaccine (6 - Booster for Pfizer series) 12/31/2020   MAMMOGRAM  01/06/2021   PAP SMEAR-Modifier  11/21/2020   Lab Results Last CBC Lab Results  Component Value Date   WBC 7.4 07/27/2020   HGB 13.0 07/27/2020    HCT 39.1 07/27/2020   MCV 92 07/27/2020   MCH 30.4 07/27/2020   RDW 13.1 07/27/2020   PLT 283 09/73/5329   Last metabolic panel Lab Results  Component Value Date   GLUCOSE 92 03/31/2019   NA 144 03/31/2019   K 4.1 03/31/2019   CL 105 03/31/2019   CO2 25 03/31/2019   BUN 8 03/31/2019   CREATININE 0.55 (L) 03/31/2019   GFRNONAA 104 03/31/2019   CALCIUM 8.7 03/31/2019   PROT 7.2 07/27/2020  ALBUMIN 4.9 07/27/2020   LABGLOB 2.3 03/31/2019   AGRATIO 1.8 03/31/2019   BILITOT 0.3 07/27/2020   ALKPHOS 60 07/27/2020   AST 15 07/27/2020   ALT 13 07/27/2020   Last lipids Lab Results  Component Value Date   CHOL 216 (H) 12/03/2018   HDL 74.50 12/03/2018   LDLCALC 127 (H) 12/03/2018   TRIG 71.0 12/03/2018   CHOLHDL 3 12/03/2018   Last thyroid functions Lab Results  Component Value Date   TSH 1.35 04/15/2020   T3TOTAL 150 12/03/2018   Last vitamin D Lab Results  Component Value Date   VD25OH 90.0 07/27/2020     Imaging: Lumbar x-ray: Normal lumbar spine.  Assessment & Plan:   1. Multiple sclerosis ([place]) She will continue to follow with Dr. Felecia Shelling.  2. Hypothyroidism (acquired) Last TSH was normal. I recommend we have her back in 3 months with an appropriate washout for biotin. Continue desiccated thyroid for now.  3. Borderline hyperlipidemia We will plan to check fasting lipids at her next blood draw.  4. Acute right-sided low back pain without sciatica I suspect this was a pulled muscle. I recommend she take Aleve 220 mg po bid for 10 days and use heat over this area. If not improving, we will consider physical therapy.  - DG Lumbar Spine Complete  5. Disorder of both eustachian tubes I suspect her underlying issue is with eustachian tube dysfunction causing the popping noise. She would like to try a longer course of Flonase prior to referral for an ENT. 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  Haydee Salter, MD

## 2021-02-01 ENCOUNTER — Ambulatory Visit: Payer: PRIVATE HEALTH INSURANCE | Admitting: Neurology

## 2021-03-30 ENCOUNTER — Ambulatory Visit: Payer: No Typology Code available for payment source | Admitting: Neurology

## 2021-03-30 ENCOUNTER — Encounter: Payer: Self-pay | Admitting: Neurology

## 2021-03-30 ENCOUNTER — Other Ambulatory Visit: Payer: Self-pay

## 2021-03-30 VITALS — BP 129/79 | HR 74 | Ht 66.0 in | Wt 142.0 lb

## 2021-03-30 DIAGNOSIS — R269 Unspecified abnormalities of gait and mobility: Secondary | ICD-10-CM

## 2021-03-30 DIAGNOSIS — G35 Multiple sclerosis: Secondary | ICD-10-CM

## 2021-03-30 DIAGNOSIS — R0683 Snoring: Secondary | ICD-10-CM | POA: Diagnosis not present

## 2021-03-30 DIAGNOSIS — G4719 Other hypersomnia: Secondary | ICD-10-CM | POA: Diagnosis not present

## 2021-03-30 DIAGNOSIS — R2689 Other abnormalities of gait and mobility: Secondary | ICD-10-CM

## 2021-03-30 DIAGNOSIS — R29898 Other symptoms and signs involving the musculoskeletal system: Secondary | ICD-10-CM

## 2021-03-30 NOTE — Progress Notes (Signed)
? ? ?[facility] ? ?PATIENT: Samantha [place] ?DOB: May 14, 1960 ? ?REFERRING DOCTOR OR PCP:  Donald Prose ?SOURCE: Patient and records from [facility] neurology. ? ?_________________________________ ? ? ?HISTORICAL ? ?CHIEF COMPLAINT:  ?Chief Complaint  ?Patient presents with  ? Follow-up  ?  Rm 2, alone. Here for 6 month MS f/u, on DF and tolerating well. Per pt L leg is dragging more. Gradual progression in sx.   ? ? ?HISTORY OF PRESENT ILLNESS:  ?Samantha [place] is a 61 year old woman with relapsing remitting multiple sclerosis diagnosed in 2007.   ? ?Update 03/30/2021 ?She is on Tecfidera (DMF) as her DMT.   She has tolerated it well.    She has no definite exacerbation though notes gradual progression, especially her left leg.    Additionally, she has had more notable numbness in her legs. ? ?She had surgery for neuroma last year and needed to wear a boot for a couple months.  Gait was even worse during that time.  Her foot pain is better since the surgery.    ? ?She has reduced balance and also notes a left foot drop, worse the longer she walks.     She has no recent falls.  .  The  left leg has weakness, spasticity and reduced coordination.   Her left hand is slightly weak but coordinated.    She has gone from using one walking stick to two.    With them she can go about 15 minutes - maybe 1/3 mile.   She uses the bannister on stairs.    ? ?Bladder function is the same with urgency but no incontinence.   ? ?Vision is doing well.    ? ?She has a lot of fatigue and some sleepiness.   She sleeps 8 hours at night and then sleeps 2 hours most afternoons.   Her sleep or nap is not restorative, however.   She sweats at night.   She snores but has not been told that she has gasping or snorting. Mood is doing about the same.   ? ?She had recent right foot surgery.    She had the joint revised and a Morton Neuroma excised.  .She is wearing a boot.   ? ?EPWORTH SLEEPINESS SCALE ? ?On a scale of 0 - 3 what is the  chance of dozing: ? ?Sitting and Reading:   1 --- 2 if no nap ?Watching TV:    1 --- 2 if no nap ?Sitting inactive in a public place: 0 ?Passenger in car for one hour: 3 ?Lying down to rest in the afternoon: 3 ?Sitting and talking to someone: 0 ?Sitting quietly after lunch:  0 ?In a car, stopped in traffic:  0 ? ?Total (out of 24):   9/24 if she takes a scheduled nap.   11/24 if she does not take a nap ? ?MS History:    ?She was diagnosed in 2007 after presenting with right sided numbness and mild clumsiness / allodynia.    MRI was consistent with MS and she was started with Betaseron.  She then switched to Copaxone but had exacerbations and switched to Tysabri.     She converted to Tysabri but became JCV Ab positive and switched to Gilenya.   Due to exacerbations on Gilenya, she switched back to Tysabri x one year and then switched to Tecfidera (in 2014).   She switched to generic DMF in 2020.    ? ?IMAGING ?MRI brain 04/08/2019 shows multiple T2/FLAIR  hyperintense foci in the hemispheres, brainstem and cerebellum in a pattern configuration consistent with chronic demyelinating plaque associated with multiple sclerosis.  None of the foci appears to be acute and they do not enhance.  Compared to the MRI dated 12/24/2015, there are no new lesions. ? ?MRI cervical spine 04/08/2019 shows T2 hyperintense foci within the spinal cord anteriorly to the right adjacent to C2, anterolaterally to the right adjacent to C3, laterally to the left adjacent to C3-C4 to C4, laterally to the left at C5, posterolaterally to the right at C6, and to the left at T1..   When compared to the MRI from 04/26/2016, there are no new lesions.  None of the foci enhance.  Mild degenerative disc changes and spondylosis at C3-C4, C4-C5 and C5-C6.  There is no nerve root compression or spinal stenosis.  This appears fairly stable compared to the previous MRI. ? ?MRI thoracic spine 04/08/2019 shows foci within the spinal cord at T8-T9 and T12 that were  also present on the 06/02/2015 MRI.  These are consistent with demyelinating plaque associated with multiple sclerosis.    Mild degenerative changes at T7-T8 and T10-T11 that do not lead to nerve root compression ? ? ?REVIEW OF SYSTEMS: ?Constitutional: No fevers, chills, sweats, or change in appetite.   She has fatigue ?Eyes: No visual changes, double vision, eye pain ?Ear, nose and throat: No hearing loss, ear pain, nasal congestion, sore throat ?Cardiovascular: No chest pain, palpitations ?Respiratory:  No shortness of breath at rest or with exertion.   No wheezes ?GastrointestinaI: No nausea, vomiting, diarrhea, abdominal pain, fecal incontinence ?Genitourinary:  No dysuria, urinary retention or frequency.  No nocturia. ?Musculoskeletal:  No neck pain, back pain ?Integumentary: No rash, pruritus, skin lesions ?Neurological: as above ?Psychiatric: No depression at this time.  No anxiety ?Endocrine: She has hypothyroidism.  No palpitations, diaphoresis, change in appetite, change in weigh or increased thirst ?Hematologic/Lymphatic:  No anemia, purpura, petechiae. ?Allergic/Immunologic: No itchy/runny eyes, nasal congestion, recent allergic reactions, rashes ? ?ALLERGIES: ?No Known Allergies ? ?HOME MEDICATIONS: ? ?Current Outpatient Medications:  ?  Alpha-Lipoic Acid 600 MG TABS, , Disp: , Rfl:  ?  Ascorbic Acid 500 MG/15ML SYRP, Take by mouth., Disp: , Rfl:  ?  Biotin 100 MG/GM POWD, , Disp: , Rfl:  ?  Cholecalciferol (VITAMIN D-3) 5000 units TABS, Take 1 tablet by mouth daily., Disp: , Rfl:  ?  Dimethyl Fumarate (TECFIDERA) 240 MG CPDR, Take 1 capsule (240 mg total) by mouth 2 (two) times daily., Disp: 180 capsule, Rfl: 3 ?  Magnesium (OPTIMAG 125) 125 MG CAPS, Take 2 capsules by mouth daily., Disp: , Rfl:  ?  Metamucil Fiber CHEW, Chew 1 each by mouth daily., Disp: , Rfl:  ?  Multiple Vitamins-Minerals (MULTIVITAMIN ADULT PO), Take by mouth., Disp: , Rfl:  ?  NP THYROID 60 MG tablet, TAKE ONE TABLET BY MOUTH  DAILY BEFORE BREAKFAST, Disp: 90 tablet, Rfl: 3 ? ?PAST MEDICAL HISTORY: ?Past Medical History:  ?Diagnosis Date  ? Anemia   ? Hypothyroidism   ? Dr. [name]  ? Lyme disease   ? Multiple sclerosis ([place])   ? Neuromuscular disorder ([place])   ? MS  ? Vision abnormalities   ? ? ?PAST SURGICAL HISTORY: ?Past Surgical History:  ?Procedure Laterality Date  ? AUGMENTATION MAMMAPLASTY Bilateral 2003  ? saline  ? BREAST IMPLANT REMOVAL Bilateral 02/17/2019  ? BREAST SURGERY  2002  ? COLONOSCOPY  2008  ? DILATATION & CURRETTAGE/HYSTEROSCOPY WITH RESECTOCOPE N/A  10/09/2012  ? Procedure: DILATATION & CURETTAGE/HYSTEROSCOPY WITH RESECTOCOPE;  Surgeon: [name], MD;  Location: [facility];  Service: Gynecology;  Laterality: N/A;  RESECTION with Versapoint ?  ? KNEE ARTHROSCOPY  1996  ? UTERINE FIBROID SURGERY  2019  ? fibroidectomy  ? ? ?FAMILY HISTORY: ?Family History  ?Problem Relation Age of Onset  ? Alzheimer's disease Mother   ? Stroke Mother   ? Hypertension Mother   ? Arthritis Mother   ? Colon cancer Father   ? Thyroid cancer Sister   ? Dementia Brother   ?     Early  ? Asthma Brother   ? Thyroid cancer Brother   ? Cancer Paternal Aunt   ?     Brain  ? Cancer Paternal Uncle   ?     Brain  ? Heart attack Maternal Grandmother   ? Hypertension Maternal Grandmother   ? CAD Maternal Grandmother   ? Diabetes Maternal Grandfather   ? Lung cancer Maternal Grandfather   ? Alzheimer's disease Paternal Grandmother   ? Hypertension Paternal Grandmother   ? Hyperlipidemia Paternal Grandmother   ? Diabetes Paternal Grandfather   ? ? ?SOCIAL HISTORY: ? ?Social History  ? ?Socioeconomic History  ? Marital status: Married  ?  Spouse name: Not on file  ? Number of children: 4  ? Years of education: Not on file  ? Highest education level: Not on file  ?Occupational History  ? Occupation: Education administrator  ?  Comment: [employer]  ?Tobacco Use  ? Smoking status: Never  ? Smokeless tobacco: Never  ?Vaping Use  ? Vaping  Use: Never used  ?Substance and Sexual Activity  ? Alcohol use: Yes  ?  Comment: 2x month  ? Drug use: No  ? Sexual activity: Yes  ?Other Topics Concern  ? Not on file  ?Social History Narrative  ? Not o

## 2021-03-31 DIAGNOSIS — M8580 Other specified disorders of bone density and structure, unspecified site: Secondary | ICD-10-CM | POA: Insufficient documentation

## 2021-04-14 ENCOUNTER — Telehealth: Payer: Self-pay

## 2021-04-14 NOTE — Telephone Encounter (Signed)
LVM for pt to call me back to schedule sleep study  

## 2021-04-18 ENCOUNTER — Ambulatory Visit: Payer: PRIVATE HEALTH INSURANCE | Admitting: Internal Medicine

## 2021-04-18 ENCOUNTER — Ambulatory Visit (INDEPENDENT_AMBULATORY_CARE_PROVIDER_SITE_OTHER): Payer: No Typology Code available for payment source | Admitting: Family Medicine

## 2021-04-18 ENCOUNTER — Encounter: Payer: Self-pay | Admitting: Family Medicine

## 2021-04-18 ENCOUNTER — Telehealth: Payer: Self-pay | Admitting: Neurology

## 2021-04-18 VITALS — BP 118/76 | HR 65 | Temp 97.8°F | Ht 66.0 in | Wt 145.0 lb

## 2021-04-18 DIAGNOSIS — D2122 Benign neoplasm of connective and other soft tissue of left lower limb, including hip: Secondary | ICD-10-CM | POA: Diagnosis not present

## 2021-04-18 DIAGNOSIS — E039 Hypothyroidism, unspecified: Secondary | ICD-10-CM | POA: Diagnosis not present

## 2021-04-18 DIAGNOSIS — G35 Multiple sclerosis: Secondary | ICD-10-CM

## 2021-04-18 DIAGNOSIS — E785 Hyperlipidemia, unspecified: Secondary | ICD-10-CM

## 2021-04-18 LAB — LIPID PANEL
Cholesterol: 233 mg/dL — ABNORMAL HIGH (ref 0–200)
HDL: 80.5 mg/dL (ref >39.00)
LDL Cholesterol: 140 mg/dL — ABNORMAL HIGH (ref 0–99)
NonHDL: 152.59
Total CHOL/HDL Ratio: 3
Triglycerides: 63 mg/dL (ref 0.0–149.0)
VLDL: 12.6 mg/dL (ref 0.0–40.0)

## 2021-04-18 LAB — TSH: TSH: 1 u[IU]/mL (ref 0.35–5.50)

## 2021-04-18 NOTE — Progress Notes (Signed)
?[facility] ?[facility] ?[place] ?[place] [address] ?Dept: [phone] ?Dept Fax: [phone] ? ?Chronic Care Office Visit ? ?Subjective:  ? ? Patient ID: Samantha Church, female    DOB: May 17, 1960, 61 y.o..   MRN: [id number] ? ?Chief Complaint  ?Patient presents with  ? Follow-up  ?  3 month f/u.  No concerns. Not fasting today.   ? ? ?History of Present Illness: ? ?Patient is in today for reassessment of chronic medical issues. ? ?Samantha Church has a history of multiple sclerosis. She was diagnosed in 2007 after she had sudden onset of right sided numbness and altered sensation. She sees Dr. Felecia Shelling (neurology). He treats her with dimethyl fumurate. Additionally, she is on alpha lipoic acid and a number of other vitamin and mineral supplements. She has some constipation at times, so uses Metamucil gummies for this. She notes that more recently she developed some eye issues, including diplopia. She is scheduled to see the neurologist in 2 days. ?  ?Samantha Church has a history of hypothyroidism. She is managed on NP Thyroid (desiccated thyroid). She has been off of her biotin so she can have this tested today. ?  ?Samantha Church has a history of borderline hyperlipidemia. ?  ?Samantha Church notes a firm knot on her left lower leg, present for some months. She states that episodically this is tender and a bit bothersome. She thought this might be related to a vein issue. ?  ?Past Medical History: ?Patient Active Problem List  ? Diagnosis Date Noted  ? Vitamin D deficiency 01/17/2021  ? Borderline hyperlipidemia 01/17/2021  ? History of Lyme disease 06/22/2020  ? High risk medication use 07/25/2018  ? Left leg weakness 10/14/2015  ? Hypothyroidism (acquired) 11/03/2014  ? Other fatigue 11/03/2014  ? Gait disorder 11/03/2014  ? Multiple sclerosis ([place]) 09/01/2008  ? ?Past Surgical History:  ?Procedure Laterality Date  ? AUGMENTATION MAMMAPLASTY Bilateral 2003  ? saline  ? BREAST IMPLANT  REMOVAL Bilateral 02/17/2019  ? BREAST SURGERY  2002  ? COLONOSCOPY  2008  ? DILATATION & CURRETTAGE/HYSTEROSCOPY WITH RESECTOCOPE N/A 10/09/2012  ? Procedure: DILATATION & CURETTAGE/HYSTEROSCOPY WITH RESECTOCOPE;  Surgeon: [name], MD;  Location: [facility];  Service: Gynecology;  Laterality: N/A;  RESECTION with Versapoint ?  ? KNEE ARTHROSCOPY  1996  ? UTERINE FIBROID SURGERY  2019  ? fibroidectomy  ? ?Family History  ?Problem Relation Age of Onset  ? Alzheimer's disease Mother   ? Stroke Mother   ? Hypertension Mother   ? Arthritis Mother   ? Colon cancer Father   ? Thyroid cancer Sister   ? Dementia Brother   ?     Early  ? Asthma Brother   ? Thyroid cancer Brother   ? Cancer Paternal Aunt   ?     Brain  ? Cancer Paternal Uncle   ?     Brain  ? Heart attack Maternal Grandmother   ? Hypertension Maternal Grandmother   ? CAD Maternal Grandmother   ? Diabetes Maternal Grandfather   ? Lung cancer Maternal Grandfather   ? Alzheimer's disease Paternal Grandmother   ? Hypertension Paternal Grandmother   ? Hyperlipidemia Paternal Grandmother   ? Diabetes Paternal Grandfather   ? ?Outpatient Medications Prior to Visit  ?Medication Sig Dispense Refill  ? Alpha-Lipoic Acid 600 MG TABS     ? Ascorbic Acid 500 MG/15ML SYRP Take by mouth.    ? Biotin 100 MG/GM POWD     ?  Cholecalciferol (VITAMIN D-3) 5000 units TABS Take 1 tablet by mouth daily.    ? Cyanocobalamin (B-12) 3000 MCG LOZG     ? Dimethyl Fumarate (TECFIDERA) 240 MG CPDR Take 1 capsule (240 mg total) by mouth 2 (two) times daily. 180 capsule 3  ? Magnesium (OPTIMAG 125) 125 MG CAPS Take 2 capsules by mouth daily.    ? Metamucil Fiber CHEW Chew 1 each by mouth daily.    ? Multiple Vitamins-Minerals (MULTIVITAMIN ADULT PO) Take by mouth.    ? NP THYROID 60 MG tablet TAKE ONE TABLET BY MOUTH DAILY BEFORE BREAKFAST 90 tablet 3  ? ?No facility-administered medications prior to visit.  ? ?No Known Allergies ?   ?Objective:  ? ?Today's Vitals  ? 04/18/21 1407  ?BP:  118/76  ?Pulse: 65  ?Temp: 97.8 ?F (36.6 ?C)  ?TempSrc: Temporal  ?SpO2: 98%  ?Weight: 145 lb (65.8 kg)  ?Height: '5\' 6"'$  (1.676 m)  ? ?Body mass index is 23.4 kg/m?.  ? ?General: Well developed, well nourished. No acute distress. ?Skin: Warm and dry. There is a firm, subcutaneous nodule present over the left lower leg, medially.  ? This is about 0.5 cm in size. There is no overlying redness. No fluctuance noted. ?Neuro: EOMI. Patient notes diplopia at extremes of eye movement. ?Psych: Alert and oriented. Normal mood and affect. ? ?Health Maintenance Due  ?Topic Date Due  ? Zoster Vaccines- Shingrix (1 of 2) Never done  ? COVID-19 Vaccine (6 - Booster for Pfizer series) 12/31/2020  ?   ?Assessment & Plan:  ? ?1. Hypothyroidism (acquired) ?Due for TSH today. We will continue her on NP Thyroid 60 mg daily. ? ?- TSH ? ?2. Borderline hyperlipidemia ?Due for repeat lipids today. ? ?- Lipid panel ? ?4. Multiple sclerosis ([place]) ?Subjective diplopia. Will see neurology this week. ? ?5. Fibroma of lower extremity, left ?The lesion on the leg appears to be a fibroma. I recommend she try heat on this area to see if she can resolve the irritation. She could consider excision if this remains painful. ? ?Return in about 6 months (around 10/18/2021) for Reassessment.  ? ?Haydee Salter, MD ?

## 2021-04-18 NOTE — Telephone Encounter (Signed)
Called pt. She reports this past Friday, she started feeling a little dizzy/like she had been drinking but she was not. Saturday, she woke up, felt more dizzy. Weak in legs, like she had been drinking. She relaxed most of the day. Later on Saturday, starting to have blurry vision. Sunday, woke up, started to have double vision.  Vision has improved some today. No longer having double vision. Still intermittent blurry vision, intermittent. Balance issues constant.  ? ?Still taking dimethyl fumarate, no missed doses. Has not started any other new medications recently. No sickness/illness.  ? ?Last two months, stopped taking high dose Bioten and alpha lipoic acid because she needed to get thyroid labs this week.  ?Aware I will discuss w/ MD and call her back. ?

## 2021-04-18 NOTE — Telephone Encounter (Signed)
Saturday and Sunday pt started having vision problems. Pt can still move and drive but vision is not clear. Would like a call back. [phone] ?

## 2021-04-18 NOTE — Telephone Encounter (Signed)
Called pt. Advised Dr. Felecia Shelling would like her to come in for appt this week for evaluation. Offered 04/20/21 at 4pm. She accepted appt. I scheduled, asked she check in at 330pm. She verbalized understanding.  ?

## 2021-04-20 ENCOUNTER — Ambulatory Visit (INDEPENDENT_AMBULATORY_CARE_PROVIDER_SITE_OTHER): Payer: No Typology Code available for payment source | Admitting: Neurology

## 2021-04-20 ENCOUNTER — Encounter: Payer: Self-pay | Admitting: Neurology

## 2021-04-20 VITALS — BP 127/80 | HR 77 | Ht 66.0 in | Wt 141.0 lb

## 2021-04-20 DIAGNOSIS — R269 Unspecified abnormalities of gait and mobility: Secondary | ICD-10-CM

## 2021-04-20 DIAGNOSIS — R2689 Other abnormalities of gait and mobility: Secondary | ICD-10-CM

## 2021-04-20 DIAGNOSIS — H532 Diplopia: Secondary | ICD-10-CM | POA: Diagnosis not present

## 2021-04-20 DIAGNOSIS — Z79899 Other long term (current) drug therapy: Secondary | ICD-10-CM | POA: Diagnosis not present

## 2021-04-20 DIAGNOSIS — G35 Multiple sclerosis: Secondary | ICD-10-CM

## 2021-04-20 MED ORDER — PREDNISONE 50 MG PO TABS: ORAL_TABLET | ORAL | 0 refills | Status: DC

## 2021-04-20 NOTE — Progress Notes (Signed)
? ? ?[facility] ? ?PATIENT: Samantha Church ?DOB: 02/04/1960 ? ?REFERRING DOCTOR OR PCP:  Donald Prose ?SOURCE: Patient and records from [facility] neurology. ? ?_________________________________ ? ? ?HISTORICAL ? ?CHIEF COMPLAINT:  ?Chief Complaint  ?Patient presents with  ? Follow-up  ?  Rm 1, alone. Here to f/u from recent double vision and blurriness. When covering/closing one eye she is able to see better.   ? ? ?HISTORY OF PRESENT ILLNESS:  ?Samantha Church is a 61 year old woman with relapsing remitting multiple sclerosis diagnosed in 2007.   ? ?Update 03/30/2021 ?She is on Tecfidera (DMF) as her DMT.   She has tolerated it well.    She has no definite exacerbation though notes gradual progression, especially her left leg.    Additionally, she has had more notable numbness in her legs. ? ?Last week, the had the onset of feeling a little off balanced/drunk but by the end of the end of the day(Friday last week)  ?She had surgery for neuroma last year and needed to wear a boot for a couple months.  Gait was even worse during that time.  Her foot pain is better since the surgery.    ? ?She has reduced balance and also notes a left foot drop, worse the longer she walks.     She has no recent falls.  .  The  left leg has weakness, spasticity and reduced coordination.   Her left hand is slightly weak but coordinated.    She has gone from using one walking stick to two.    With them she can go about 15 minutes - maybe 1/3 mile.   She uses the bannister on stairs.    ? ?Bladder function is the same with urgency but no incontinence.   ? ?Vision is doing well.    ? ?She has a lot of fatigue and some sleepiness.   She sleeps 8 hours at night and then sleeps 2 hours most afternoons.   Her sleep or nap is not restorative, however.   She sweats at night.   She snores but has not been told that she has gasping or snorting. Mood is doing about the same.   ? ?She had recent right foot surgery.    She had the joint  revised and a Morton Neuroma excised.  .She is wearing a boot.   ? ?EPWORTH SLEEPINESS SCALE ? ?On a scale of 0 - 3 what is the chance of dozing: ? ?Sitting and Reading:   1 --- 2 if no nap ?Watching TV:    1 --- 2 if no nap ?Sitting inactive in a public place: 0 ?Passenger in car for one hour: 3 ?Lying down to rest in the afternoon: 3 ?Sitting and talking to someone: 0 ?Sitting quietly after lunch:  0 ?In a car, stopped in traffic:  0 ? ?Total (out of 24):   9/24 if she takes a scheduled nap.   11/24 if she does not take a nap ? ?MS History:    ?She was diagnosed in 2007 after presenting with right sided numbness and mild clumsiness / allodynia.    MRI was consistent with MS and she was started with Betaseron.  She then switched to Copaxone but had exacerbations and switched to Tysabri.     She converted to Tysabri but became JCV Ab positive and switched to Gilenya.   Due to exacerbations on Gilenya, she switched back to Tysabri x one year and then switched to Tecfidera (  in 2014).   She switched to generic DMF in 2020.    ? ?IMAGING ?MRI brain 04/08/2019 shows multiple T2/FLAIR hyperintense foci in the hemispheres, brainstem and cerebellum in a pattern configuration consistent with chronic demyelinating plaque associated with multiple sclerosis.  None of the foci appears to be acute and they do not enhance.  Compared to the MRI dated 12/24/2015, there are no new lesions. ? ?MRI cervical spine 04/08/2019 shows T2 hyperintense foci within the spinal cord anteriorly to the right adjacent to C2, anterolaterally to the right adjacent to C3, laterally to the left adjacent to C3-C4 to C4, laterally to the left at C5, posterolaterally to the right at C6, and to the left at T1..   When compared to the MRI from 04/26/2016, there are no new lesions.  None of the foci enhance.  Mild degenerative disc changes and spondylosis at C3-C4, C4-C5 and C5-C6.  There is no nerve root compression or spinal stenosis.  This appears fairly  stable compared to the previous MRI. ? ?MRI thoracic spine 04/08/2019 shows foci within the spinal cord at T8-T9 and T12 that were also present on the 06/02/2015 MRI.  These are consistent with demyelinating plaque associated with multiple sclerosis.    Mild degenerative changes at T7-T8 and T10-T11 that do not lead to nerve root compression ? ? ?REVIEW OF SYSTEMS: ?Constitutional: No fevers, chills, sweats, or change in appetite.   She has fatigue ?Eyes: No visual changes, double vision, eye pain ?Ear, nose and throat: No hearing loss, ear pain, nasal congestion, sore throat ?Cardiovascular: No chest pain, palpitations ?Respiratory:  No shortness of breath at rest or with exertion.   No wheezes ?GastrointestinaI: No nausea, vomiting, diarrhea, abdominal pain, fecal incontinence ?Genitourinary:  No dysuria, urinary retention or frequency.  No nocturia. ?Musculoskeletal:  No neck pain, back pain ?Integumentary: No rash, pruritus, skin lesions ?Neurological: as above ?Psychiatric: No depression at this time.  No anxiety ?Endocrine: She has hypothyroidism.  No palpitations, diaphoresis, change in appetite, change in weigh or increased thirst ?Hematologic/Lymphatic:  No anemia, purpura, petechiae. ?Allergic/Immunologic: No itchy/runny eyes, nasal congestion, recent allergic reactions, rashes ? ?ALLERGIES: ?No Known Allergies ? ?HOME MEDICATIONS: ? ?Current Outpatient Medications:  ?  Alpha-Lipoic Acid 600 MG TABS, , Disp: , Rfl:  ?  Ascorbic Acid 500 MG/15ML SYRP, Take by mouth., Disp: , Rfl:  ?  Biotin 100 MG/GM POWD, , Disp: , Rfl:  ?  Cholecalciferol (VITAMIN D-3) 5000 units TABS, Take 1 tablet by mouth daily., Disp: , Rfl:  ?  Cyanocobalamin (B-12) 3000 MCG LOZG, , Disp: , Rfl:  ?  Dimethyl Fumarate (TECFIDERA) 240 MG CPDR, Take 1 capsule (240 mg total) by mouth 2 (two) times daily., Disp: 180 capsule, Rfl: 3 ?  Magnesium (OPTIMAG 125) 125 MG CAPS, Take 2 capsules by mouth daily., Disp: , Rfl:  ?  Metamucil Fiber  CHEW, Chew 1 each by mouth daily., Disp: , Rfl:  ?  Multiple Vitamins-Minerals (MULTIVITAMIN ADULT PO), Take by mouth., Disp: , Rfl:  ?  NP THYROID 60 MG tablet, TAKE ONE TABLET BY MOUTH DAILY BEFORE BREAKFAST, Disp: 90 tablet, Rfl: 3 ?  predniSONE (DELTASONE) 50 MG tablet, 1000 mg (20 pills) po qd x 4 days for MS exacerbation, Disp: 80 tablet, Rfl: 0 ? ?PAST MEDICAL HISTORY: ?Past Medical History:  ?Diagnosis Date  ? Anemia   ? Hypothyroidism   ? Dr. [name]  ? Lyme disease   ? Multiple sclerosis ([place])   ? Neuromuscular disorder (  [place])   ? MS  ? Vision abnormalities   ? ? ?PAST SURGICAL HISTORY: ?Past Surgical History:  ?Procedure Laterality Date  ? AUGMENTATION MAMMAPLASTY Bilateral 2003  ? saline  ? BREAST IMPLANT REMOVAL Bilateral 02/17/2019  ? BREAST SURGERY  2002  ? COLONOSCOPY  2008  ? DILATATION & CURRETTAGE/HYSTEROSCOPY WITH RESECTOCOPE N/A 10/09/2012  ? Procedure: DILATATION & CURETTAGE/HYSTEROSCOPY WITH RESECTOCOPE;  Surgeon: [name], MD;  Location: [facility];  Service: Gynecology;  Laterality: N/A;  RESECTION with Versapoint ?  ? KNEE ARTHROSCOPY  1996  ? UTERINE FIBROID SURGERY  2019  ? fibroidectomy  ? ? ?FAMILY HISTORY: ?Family History  ?Problem Relation Age of Onset  ? Alzheimer's disease Mother   ? Stroke Mother   ? Hypertension Mother   ? Arthritis Mother   ? Polycythemia Mother   ? Colon cancer Father   ? Thyroid cancer Sister   ? Dementia Brother   ?     Early  ? Asthma Brother   ? Thyroid cancer Brother   ? Cancer Paternal Aunt   ?     Brain  ? Cancer Paternal Uncle   ?     Brain  ? Heart attack Maternal Grandmother   ? Hypertension Maternal Grandmother   ? CAD Maternal Grandmother   ? Diabetes Maternal Grandfather   ? Lung cancer Maternal Grandfather   ? Alzheimer's disease Paternal Grandmother   ? Hypertension Paternal Grandmother   ? Hyperlipidemia Paternal Grandmother   ? Diabetes Paternal Grandfather   ? ? ?SOCIAL HISTORY: ? ?Social History  ? ?Socioeconomic History  ? Marital status:  Married  ?  Spouse name: Not on file  ? Number of children: 4  ? Years of education: Not on file  ? Highest education level: Not on file  ?Occupational History  ? Occupation: Geographical information systems officer

## 2021-04-23 LAB — QUANTIFERON-TB GOLD PLUS
QuantiFERON Mitogen Value: 5.78 IU/mL
QuantiFERON Nil Value: 0.01 IU/mL
QuantiFERON TB1 Ag Value: 0.02 IU/mL
QuantiFERON TB2 Ag Value: 0.01 IU/mL
QuantiFERON-TB Gold Plus: NEGATIVE

## 2021-04-23 LAB — CBC WITH DIFFERENTIAL/PLATELET
Basophils Absolute: 0 10*3/uL (ref 0.0–0.2)
Basos: 0 %
EOS (ABSOLUTE): 0.1 10*3/uL (ref 0.0–0.4)
Eos: 1 %
Hematocrit: 40.9 % (ref 34.0–46.6)
Hemoglobin: 13.5 g/dL (ref 11.1–15.9)
Immature Grans (Abs): 0 10*3/uL (ref 0.0–0.1)
Immature Granulocytes: 0 %
Lymphocytes Absolute: 1.4 10*3/uL (ref 0.7–3.1)
Lymphs: 14 %
MCH: 30.1 pg (ref 26.6–33.0)
MCHC: 33 g/dL (ref 31.5–35.7)
MCV: 91 fL (ref 79–97)
Monocytes Absolute: 0.7 10*3/uL (ref 0.1–0.9)
Monocytes: 7 %
Neutrophils Absolute: 8.2 10*3/uL — ABNORMAL HIGH (ref 1.4–7.0)
Neutrophils: 78 %
Platelets: 304 10*3/uL (ref 150–450)
RBC: 4.49 x10E6/uL (ref 3.77–5.28)
RDW: 12.8 % (ref 11.7–15.4)
WBC: 10.5 10*3/uL (ref 3.4–10.8)

## 2021-04-23 LAB — HEPATITIS C ANTIBODY: Hep C Virus Ab: NONREACTIVE

## 2021-04-23 LAB — COMPREHENSIVE METABOLIC PANEL
ALT: 17 IU/L (ref 0–32)
AST: 19 IU/L (ref 0–40)
Albumin/Globulin Ratio: 2 (ref 1.2–2.2)
Albumin: 4.8 g/dL (ref 3.8–4.9)
Alkaline Phosphatase: 65 IU/L (ref 44–121)
BUN/Creatinine Ratio: 13 (ref 12–28)
BUN: 9 mg/dL (ref 8–27)
Bilirubin Total: <0.2 mg/dL (ref 0.0–1.2)
CO2: 23 mmol/L (ref 20–29)
Calcium: 9.7 mg/dL (ref 8.7–10.3)
Chloride: 104 mmol/L (ref 96–106)
Creatinine, Ser: 0.67 mg/dL (ref 0.57–1.00)
Globulin, Total: 2.4 g/dL (ref 1.5–4.5)
Glucose: 92 mg/dL (ref 70–99)
Potassium: 4.4 mmol/L (ref 3.5–5.2)
Sodium: 144 mmol/L (ref 134–144)
Total Protein: 7.2 g/dL (ref 6.0–8.5)
eGFR: 100 mL/min/{1.73_m2} (ref >59)

## 2021-04-23 LAB — HEPATITIS B SURFACE ANTIGEN: Hepatitis B Surface Ag: NEGATIVE

## 2021-04-23 LAB — IGG, IGA, IGM
IgA/Immunoglobulin A, Serum: 88 mg/dL (ref 87–352)
IgG (Immunoglobin G), Serum: 971 mg/dL (ref 586–1602)
IgM (Immunoglobulin M), Srm: 98 mg/dL (ref 26–217)

## 2021-04-23 LAB — HIV ANTIBODY (ROUTINE TESTING W REFLEX): HIV Screen 4th Generation wRfx: NONREACTIVE

## 2021-04-23 LAB — HEPATITIS B SURFACE ANTIBODY,QUALITATIVE: Hep B Surface Ab, Qual: NONREACTIVE

## 2021-04-23 LAB — HEPATITIS B CORE ANTIBODY, TOTAL: Hep B Core Total Ab: NEGATIVE

## 2021-04-24 ENCOUNTER — Encounter: Payer: Self-pay | Admitting: Neurology

## 2021-04-25 ENCOUNTER — Ambulatory Visit (INDEPENDENT_AMBULATORY_CARE_PROVIDER_SITE_OTHER): Payer: No Typology Code available for payment source | Admitting: Internal Medicine

## 2021-04-25 ENCOUNTER — Telehealth: Payer: Self-pay

## 2021-04-25 ENCOUNTER — Encounter: Payer: Self-pay | Admitting: Internal Medicine

## 2021-04-25 VITALS — BP 120/78 | HR 66 | Ht 66.0 in | Wt 149.2 lb

## 2021-04-25 DIAGNOSIS — E039 Hypothyroidism, unspecified: Secondary | ICD-10-CM

## 2021-04-25 MED ORDER — NP THYROID 60 MG PO TABS: ORAL_TABLET | ORAL | 3 refills | Status: DC

## 2021-04-25 NOTE — Telephone Encounter (Signed)
Faxed Ocrevus start form to [place]. Received a receipt of confirmation. ? ?

## 2021-04-25 NOTE — Patient Instructions (Signed)
Please continue NP thyroid 60 mg daily. ? ?Take the thyroid hormone every day, with water, at least 30 minutes before breakfast, separated by at least 4 hours from: ?- acid reflux medications ?- calcium ?- iron ?- multivitamins ? ?Please come back for a follow-up appointment in 1 year. ? ?

## 2021-04-25 NOTE — Telephone Encounter (Signed)
-----   Message from Britt Bottom, MD sent at 04/25/2021  4:23 PM EDT ----- ?Lab work looks good.  I discussed the options with her and she would like to proceed with Ocrevus. ?

## 2021-04-25 NOTE — Progress Notes (Signed)
Patient ID: Samantha Church, female   DOB: 10-Nov-1960, 61 y.o.   MRN: [id number]  ? ?This visit occurred during the SARS-CoV-2 public health emergency.  Safety protocols were in place, including screening questions prior to the visit, additional usage of staff PPE, and extensive cleaning of exam room while observing appropriate contact time as indicated for disinfecting solutions.  ? ?HPI  ?Samantha Church is a 61 y.o.-year-old female, presenting for follow-up for hypothyroidism.  Last visit 1 year ago. ? ?Interim history: ?At today's visit, her right eye is covered by a patch.  She developed double vision over the weekend.  She contacted her neurologist and was started on prednisone.  However, double vision persists.  She will have an IV steroid infusion today.  She will then be taken off the Tecfidera and change to another medication. ?She denies weakness, falls.  No weight gain/loss. ?She had thyroid tests checked 1 week ago.  In preparation for these, she came off her high-dose biotin for 4 weeks. ? ?Reviewed history: ?Pt. has been dx with hypothyroidism in 2013 >> she was initially on Synthroid, then changed to NP thyroid at [facility]).   ? ?She takes NP thyroid 60 mg daily: ?- in am ?- fasting ?- + Coffee (black) at least 30 minutes later ?- at least 30 min from b'fast ?- no Ca, Fe ?- + MVI -moved from 1 hour to 4 hours after thyroid hormone ?- no PPIs ?- on Biotin very high dose (100,000 mcg 1-2x daily) - for MS >> was off for 4 weeks before the last thyroid check ?On vitamin C, D, Mg, Opticleanse, ALA (also for MS), NRF2 activator (will stop soon), Turmeric (stopped). ? ?Reviewed her TFTs: ?Lab Results  ?Component Value Date  ? TSH 1.00 04/18/2021  ? TSH 1.35 04/15/2020  ? TSH 0.98 04/16/2019  ? TSH 0.76 12/03/2018  ? TSH 0.77 07/29/2018  ? TSH 0.70 05/07/2017  ? TSH 0.75 11/07/2016  ? TSH 0.88 06/27/2016  ? TSH 1.150 05/14/2015  ? TSH 1.87 11/24/2010  ? FREET4 0.79 04/15/2020  ? FREET4  5.19 (H) 04/16/2019  ? FREET4 1.39 12/03/2018  ? FREET4 1.1 07/29/2018  ? FREET4 0.78 05/07/2017  ? FREET4 0.85 11/07/2016  ? ?Lab Results  ?Component Value Date  ? T3FREE 3.2 04/15/2020  ? T3FREE 18.2 (H) 04/16/2019  ? T3FREE 4.1 07/29/2018  ? T3FREE 4.3 (H) 05/07/2017  ? T3FREE 4.1 11/07/2016  ? ?Investigation for Hashimoto's thyroiditis was negative: ?Component ?    Latest Ref Rng & Units 11/07/2016  ?Thyroglobulin Ab ?    < or = 1 IU/mL <1  ?Thyroperoxidase Ab SerPl-aCnc ?    <9 IU/mL <1  ? ?She continues to have fatigue and hot flashes. ? ?Pt denies: ?- feeling nodules in neck ?- hoarseness ?- dysphagia ?- choking ?- SOB with lying down ? ?Thyroid U.S (05/07/2007) report reviewed: Normal ? ?No family history of hypo or hyperthyroidism but + FH of thyroid cancer in brother and sister: PTC. No h/o radiation tx to head or neck. ?No new herbal supplements. + steroids use - this was started this weekend.  ? ?She got her breast implants removed 02/2019. ? ?She was seeing [facility] for Lyme ds.  Of note, liver tests became elevated>> stopped the ABx for Lyme ds.  LFTs normalized: ?Lab Results  ?Component Value Date  ? ALT 17 04/20/2021  ? AST 19 04/20/2021  ? ALKPHOS 65 04/20/2021  ? BILITOT <0.2  04/20/2021  ? ?ROS:+ see HPI ? ?I reviewed pt's medications, allergies, PMH, social hx, family hx, and changes were documented in the history of present illness. Otherwise, unchanged from my initial visit note. ? ?Past Medical History:  ?Diagnosis Date  ? Anemia   ? Hypothyroidism   ? Dr. [name]  ? Lyme disease   ? Multiple sclerosis ([place])   ? Neuromuscular disorder ([place])   ? MS  ? Vision abnormalities   ? ?Past Surgical History:  ?Procedure Laterality Date  ? AUGMENTATION MAMMAPLASTY Bilateral 2003  ? saline  ? BREAST IMPLANT REMOVAL Bilateral 02/17/2019  ? BREAST SURGERY  2002  ? COLONOSCOPY  2008  ? DILATATION & CURRETTAGE/HYSTEROSCOPY WITH RESECTOCOPE N/A 10/09/2012  ? Procedure: DILATATION  & CURETTAGE/HYSTEROSCOPY WITH RESECTOCOPE;  Surgeon: [name], MD;  Location: [facility];  Service: Gynecology;  Laterality: N/A;  RESECTION with Versapoint ?  ? KNEE ARTHROSCOPY  1996  ? UTERINE FIBROID SURGERY  2019  ? fibroidectomy  ? ?Social History  ? ?Social History  ? Marital status: Married  ?  Spouse name: N/A  ? Number of children: 4  ? 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 ?Social History Main Topics  ? Smoking status: Never Smoker  ? Smokeless tobacco: Never Used  ? Alcohol use Yes  ?   Comment: wine, nightly  ? Drug use: No  ? ?Current Outpatient Medications on File Prior to Visit  ?Medication Sig Dispense Refill  ? Alpha-Lipoic Acid 600 MG TABS     ? Ascorbic Acid 500 MG/15ML SYRP Take by mouth.    ? Biotin 100 MG/GM POWD     ? Cholecalciferol (VITAMIN D-3) 5000 units TABS Take 1 tablet by mouth daily.    ? Cyanocobalamin (B-12) 3000 MCG LOZG     ? Dimethyl Fumarate (TECFIDERA) 240 MG CPDR Take 1 capsule (240 mg total) by mouth 2 (two) times daily. 180 capsule 3  ? Magnesium (OPTIMAG 125) 125 MG CAPS Take 2 capsules by mouth daily.    ? Metamucil Fiber CHEW Chew 1 each by mouth daily.    ? Multiple Vitamins-Minerals (MULTIVITAMIN ADULT PO) Take by mouth.    ? NP THYROID 60 MG tablet TAKE ONE TABLET BY MOUTH DAILY BEFORE BREAKFAST 90 tablet 3  ? predniSONE (DELTASONE) 50 MG tablet 1000 mg (20 pills) po qd x 4 days for MS exacerbation 80 tablet 0  ? ?No current facility-administered medications on file prior to visit.  ? ?No Known Allergies ?Family History  ?Problem Relation Age of Onset  ? Alzheimer's disease Mother   ? Stroke Mother   ? Hypertension Mother   ? Arthritis Mother   ? Polycythemia Mother   ? Colon cancer Father   ? Thyroid cancer Sister   ? Dementia Brother   ?     Early  ? Asthma Brother   ? Thyroid cancer Brother   ? Cancer Paternal Aunt   ?     Brain  ? Cancer Paternal Uncle   ?     Brain  ? Heart attack Maternal Grandmother   ? Hypertension Maternal Grandmother   ? CAD Maternal  Grandmother   ? Diabetes Maternal Grandfather   ? Lung cancer Maternal Grandfather   ? Alzheimer's disease Paternal Grandmother   ? Hypertension Paternal Grandmother   ? Hyperlipidemia Paternal Grandmother   ? Diabetes Paternal Grandfather   ? ?Also, DM in PGF; HL, heart ds in M. ? ?PE: ?BP 120/78 (BP Location: Right Arm, Patient  Position: Sitting, Cuff Size: Normal)   Pulse 66   Ht _0  (1.676 m)   Wt 149 lb 3.2 oz (67.7 kg)   LMP 03/01/2016   SpO2 99%   BMI 24.08 kg/m?  ?Wt Readings from Last 3 Encounters:  ?04/25/21 149 lb 3.2 oz (67.7 kg)  ?04/20/21 141 lb (64 kg)  ?04/18/21 145 lb (65.8 kg)  ? ?Constitutional: normal weight, in NAD, walks with 2 canes ?Eyes: PERRLA, EOMI, no exophthalmos ?ENT: moist mucous membranes, no thyromegaly, no cervical lymphadenopathy ?Cardiovascular: RRR, No MRG ?Respiratory: CTA B ?Musculoskeletal: no deformities, strength intact in all 4 ?Skin: moist, warm, no rashes ?Neurological: no tremor with outstretched hands, DTR normal in all 4 ? ?ASSESSMENT: ?1. Acquired Hypothyroidism ? ?PLAN:  ?1. Patient with longstanding, nonautoimmune hypothyroidism, on desiccated thyroid extract ?- latest thyroid labs reviewed with pt. >> normal earlier this month: ?Lab Results  ?Component Value Date  ? TSH 1.00 04/18/2021  ?- she continues on NP thyroid 60 mg daily ?- pt feels good on this dose. ?- we discussed about taking the thyroid hormone every day, with water, >30 minutes before breakfast, separated by >4 hours from acid reflux medications, calcium, iron, multivitamins. At last visit, she was taking multivitamins only 1 hour after NP thyroid and I advised her to move this at least 4 hours later.  She is not taking the thyroid hormone correctly. ?- Of note, she is on 100,000 units biotin for MS but she usually stops this 4 weeks prior to thyroid blood draw.  She stopped it 5 weeks ago and resumed it after the last results return.  She had an MS exacerbation with double vision this  weekend.  At today's visit, I advised her to check with her neurologist whether stopping biotin could have caused the symptoms.  If so, we will not be able to stop it for her and we need to either follow her without a T

## 2021-04-26 ENCOUNTER — Telehealth: Payer: Self-pay | Admitting: Neurology

## 2021-04-26 NOTE — Telephone Encounter (Signed)
Ocrevus start form and order given to the Infusion suite for processing. Will continue to follow. ?

## 2021-04-26 NOTE — Telephone Encounter (Signed)
[employer] pending faxed notes  ?

## 2021-04-27 ENCOUNTER — Ambulatory Visit (INDEPENDENT_AMBULATORY_CARE_PROVIDER_SITE_OTHER): Payer: No Typology Code available for payment source | Admitting: Neurology

## 2021-04-27 DIAGNOSIS — R0683 Snoring: Secondary | ICD-10-CM

## 2021-04-27 DIAGNOSIS — G4719 Other hypersomnia: Secondary | ICD-10-CM

## 2021-04-27 DIAGNOSIS — G4710 Hypersomnia, unspecified: Secondary | ICD-10-CM

## 2021-05-02 NOTE — Telephone Encounter (Signed)
LVM for pt to call back to schedule   ? ?[employer] auth: [id number] (exp. 04/27/21 to 07/26/21)  ?

## 2021-05-03 NOTE — Progress Notes (Signed)
? ?  [facility] ? ?HOME SLEEP STUDY ? ?STUDY DATE: 04/27/2021 ?PATIENT NAME: SUHEYLA MORTELLARO ?DOB: 10-11-1960 ?MRN: [id number] ? ?ORDERING CLINICIAN: Richard A. Felecia Shelling, MD, PhD ?REFERRING CLINICIAN: Richard A. Felecia Shelling, MD. PhD ? ? ?CLINICAL INFORMATION: 62 year old multiple sclerosis daytime sleepiness ? ? ?IMPRESSION:  ?Normal sleep onset latency and sleep architecture.   ?No significant OSA.  The pAHI = 1.2 (3.2 during REM sleep) ?No nocturnal hypoxemia ? ? ?RECOMMENDATION: ?There is no obstructive sleep apnea so CPAP therapy is not indicated.  If the snoring is troublesome, consider an oral appliance. ?Follow-up with Dr. Felecia Shelling. ? ? ?INTERPRETING PHYSICIAN:  ? ?Richard A. Felecia Shelling, MD, PhD, Charlynn Grimes ?Certified in Neurology, Clinical Neurophysiology, Sleep Medicine, Pain Medicine and Neuroimaging ? ?[facility] ?[address], [address] ?[place], [place] [address] ?[phone] ? ? ? ? ? ? ? ? ? ? ? ? ? ? ? ? ? ? ? ? ? ? ? ? ? ? ? ?

## 2021-05-03 NOTE — Telephone Encounter (Signed)
Patient returned my call she is scheduled at [facility] for 05/04/21.  ?

## 2021-05-04 ENCOUNTER — Ambulatory Visit: Payer: No Typology Code available for payment source

## 2021-05-04 ENCOUNTER — Encounter: Payer: Self-pay | Admitting: Neurology

## 2021-05-04 DIAGNOSIS — G35 Multiple sclerosis: Secondary | ICD-10-CM

## 2021-05-04 DIAGNOSIS — H532 Diplopia: Secondary | ICD-10-CM

## 2021-05-04 MED ORDER — GADOBENATE DIMEGLUMINE 529 MG/ML IV SOLN
15.0000 mL | Freq: Once | INTRAVENOUS | Status: AC | PRN
  Administered 2021-05-04: 15 mL via INTRAVENOUS

## 2021-05-05 ENCOUNTER — Telehealth: Payer: Self-pay | Admitting: Neurology

## 2021-05-05 NOTE — Telephone Encounter (Signed)
MRI of the brain 05/04/2021 showed multiple T2/FLAIR hyperintense foci in the hemispheres and a few foci in the brainstem and cerebellum.  1 focus in the posterior right pons was not present on the 2021 MRI while other foci seen to be chronic.  The new focus was nonenhancing but she had been on high-dose steroids for several days last week. ? ?It is in a location that explains her diplopia on right gaze and reduced abduction of the right eye.  Unfortunately she has not yet had any improvement and we discussed that 2 and half weeks is still early and that hopefully by 6 to 8 weeks she will start to see some improvement.  We discussed that if after a few months she does not note any improvement prism glasses may help the diplopia.  Her ophthalmologist is going to refer her to neuro-ophthalmology. ?

## 2021-05-11 ENCOUNTER — Encounter: Payer: Self-pay | Admitting: Neurology

## 2021-05-31 ENCOUNTER — Encounter: Payer: Self-pay | Admitting: Neurology

## 2021-06-07 NOTE — Telephone Encounter (Signed)
Patient's first Ocrevus infusion was 06/02/2021.

## 2021-07-07 ENCOUNTER — Ambulatory Visit: Payer: PRIVATE HEALTH INSURANCE | Admitting: Neurology

## 2021-10-05 ENCOUNTER — Ambulatory Visit (INDEPENDENT_AMBULATORY_CARE_PROVIDER_SITE_OTHER): Payer: No Typology Code available for payment source | Admitting: Neurology

## 2021-10-05 ENCOUNTER — Encounter: Payer: Self-pay | Admitting: Neurology

## 2021-10-05 VITALS — BP 133/88 | HR 68 | Ht 66.0 in | Wt 153.4 lb

## 2021-10-05 DIAGNOSIS — H532 Diplopia: Secondary | ICD-10-CM

## 2021-10-05 DIAGNOSIS — G35D Multiple sclerosis, unspecified: Secondary | ICD-10-CM

## 2021-10-05 DIAGNOSIS — Z79899 Other long term (current) drug therapy: Secondary | ICD-10-CM

## 2021-10-05 DIAGNOSIS — R269 Unspecified abnormalities of gait and mobility: Secondary | ICD-10-CM

## 2021-10-05 DIAGNOSIS — R29898 Other symptoms and signs involving the musculoskeletal system: Secondary | ICD-10-CM

## 2021-10-05 DIAGNOSIS — G35 Multiple sclerosis: Secondary | ICD-10-CM

## 2021-10-05 DIAGNOSIS — R5383 Other fatigue: Secondary | ICD-10-CM

## 2021-10-05 MED ORDER — ARMODAFINIL 200 MG PO TABS: ORAL_TABLET | ORAL | 5 refills | Status: DC

## 2021-10-05 NOTE — Progress Notes (Signed)
[facility]  PATIENT: Samantha Church DOB: 1960-06-10  REFERRING DOCTOR OR PCP:  Donald Prose SOURCE: Patient and records from [facility] neurology.  _________________________________   HISTORICAL  CHIEF COMPLAINT:  Chief Complaint  Patient presents with   Follow-up    Pt in room #1 and alone. Pt here today for an f/u.    HISTORY OF PRESENT ILLNESS:  Samantha Church is a 61 year old woman with relapsing remitting multiple sclerosis diagnosed in 2007.    Update 10/05/2021 She had a relapse earlier this yar and went on Ocrevus.   Next infusion is mid [place] feels the infusion went well but sh has noticed gait is mildly worse.    She has no diplopia in primary gaze but notes on left gaze.   She will be seeing Dr. Hassell Done.   Monocular vision is symmetric   Balance is still off and she has more numbness in legs.    She has a left foot drop.    The  left leg has weakness, spasticity and reduced coordination.   Her left hand is slightly weak but coordinated.    She uses two walking sicks and can go 500 feet without a break.       She uses the bannister on stairs.     Bladder function is the same with urgency but no incontinence.    She is very fatigued currenly   She has some sleepiness.   Sleep is not restorative so she takes a 2 hour nap with some benefit.  .   She sleeps 8 hours at night and then sleeps 2 hours most afternoons.The Home Sleep test showed AHI = 1.2 (normal).    Mood is doing about the same.      EPWORTH SLEEPINESS SCALE  On a scale of 0 - 3 what is the chance of dozing:  Sitting and Reading:   1 --- 2 if no nap Watching TV:    1 --- 2 if no nap Sitting inactive in a public place: 0 Passenger in car for one hour: 3 Lying down to rest in the afternoon: 3 Sitting and talking to someone: 0 Sitting quietly after lunch:  0 In a car, stopped in traffic:  0  Total (out of 24):   9/24 if she takes a scheduled nap.   11/24 if she does not take a  nap  MS History:    She was diagnosed in 2007 after presenting with right sided numbness and mild clumsiness / allodynia.    MRI was consistent with MS and she was started with Betaseron.  She then switched to Copaxone but had exacerbations and switched to Tysabri.     She converted to Tysabri but became JCV Ab positive and switched to Gilenya.   Due to exacerbations on Gilenya, she switched back to Tysabri x one year and then switched to Tecfidera (in 2014).   She switched to generic DMF in 2020.     IMAGING MRI brain 04/08/2019 shows multiple T2/FLAIR hyperintense foci in the hemispheres, brainstem and cerebellum in a pattern configuration consistent with chronic demyelinating plaque associated with multiple sclerosis.  None of the foci appears to be acute and they do not enhance.  Compared to the MRI dated 12/24/2015, there are no new lesions.  MRI cervical spine 04/08/2019 shows T2 hyperintense foci within the spinal cord anteriorly to the right adjacent to C2, anterolaterally to the right adjacent to C3, laterally to the left adjacent to C3-C4 to  C4, laterally to the left at C5, posterolaterally to the right at C6, and to the left at T1..   When compared to the MRI from 04/26/2016, there are no new lesions.  None of the foci enhance.  Mild degenerative disc changes and spondylosis at C3-C4, C4-C5 and C5-C6.  There is no nerve root compression or spinal stenosis.  This appears fairly stable compared to the previous MRI.  MRI thoracic spine 04/08/2019 shows foci within the spinal cord at T8-T9 and T12 that were also present on the 06/02/2015 MRI.  These are consistent with demyelinating plaque associated with multiple sclerosis.    Mild degenerative changes at T7-T8 and T10-T11 that do not lead to nerve root compression   REVIEW OF SYSTEMS: Constitutional: No fevers, chills, sweats, or change in appetite.   She has fatigue Eyes: No visual changes, double vision, eye pain Ear, nose and throat: No  hearing loss, ear pain, nasal congestion, sore throat Cardiovascular: No chest pain, palpitations Respiratory:  No shortness of breath at rest or with exertion.   No wheezes GastrointestinaI: No nausea, vomiting, diarrhea, abdominal pain, fecal incontinence Genitourinary:  No dysuria, urinary retention or frequency.  No nocturia. Musculoskeletal:  No neck pain, back pain Integumentary: No rash, pruritus, skin lesions Neurological: as above Psychiatric: No depression at this time.  No anxiety Endocrine: She has hypothyroidism.  No palpitations, diaphoresis, change in appetite, change in weigh or increased thirst Hematologic/Lymphatic:  No anemia, purpura, petechiae. Allergic/Immunologic: No itchy/runny eyes, nasal congestion, recent allergic reactions, rashes  ALLERGIES: No Known Allergies  HOME MEDICATIONS:  Current Outpatient Medications:    Alpha-Lipoic Acid 600 MG TABS, , Disp: , Rfl:    Armodafinil 200 MG TABS, One po qAM, Disp: 30 tablet, Rfl: 5   Ascorbic Acid 500 MG/15ML SYRP, Take by mouth., Disp: , Rfl:    Biotin 100 MG/GM POWD, , Disp: , Rfl:    Cholecalciferol (VITAMIN D-3) 5000 units TABS, Take 1 tablet by mouth daily., Disp: , Rfl:    Cyanocobalamin (B-12) 3000 MCG LOZG, , Disp: , Rfl:    Magnesium (OPTIMAG 125) 125 MG CAPS, Take 2 capsules by mouth daily., Disp: , Rfl:    Metamucil Fiber CHEW, Chew 1 each by mouth daily., Disp: , Rfl:    Multiple Vitamins-Minerals (MULTIVITAMIN ADULT PO), Take by mouth., Disp: , Rfl:    NP THYROID 60 MG tablet, TAKE ONE TABLET BY MOUTH DAILY BEFORE BREAKFAST, Disp: 90 tablet, Rfl: 3   ocrelizumab 300 mg in sodium chloride 0.9 % 250 mL, Inject 300 mg into the vein once., Disp: , Rfl:    Dimethyl Fumarate (TECFIDERA) 240 MG CPDR, Take 1 capsule (240 mg total) by mouth 2 (two) times daily., Disp: 180 capsule, Rfl: 3   predniSONE (DELTASONE) 50 MG tablet, 1000 mg (20 pills) po qd x 4 days for MS exacerbation, Disp: 80 tablet, Rfl: 0  PAST  MEDICAL HISTORY: Past Medical History:  Diagnosis Date   Anemia    Hypothyroidism    Dr. [name]   Lyme disease    Multiple sclerosis ([place])    Neuromuscular disorder ([place])    MS   Vision abnormalities     PAST SURGICAL HISTORY: Past Surgical History:  Procedure Laterality Date   AUGMENTATION MAMMAPLASTY Bilateral 2003   saline   BREAST IMPLANT REMOVAL Bilateral 02/17/2019   BREAST SURGERY  2002   COLONOSCOPY  2008   DILATATION & CURRETTAGE/HYSTEROSCOPY WITH RESECTOCOPE N/A 10/09/2012   Procedure: DILATATION & CURETTAGE/HYSTEROSCOPY WITH RESECTOCOPE;  Surgeon: [name], MD;  Location: [facility];  Service: Gynecology;  Laterality: N/A;  RESECTION with Versapoint    KNEE ARTHROSCOPY  1996   UTERINE FIBROID SURGERY  2019   fibroidectomy    FAMILY HISTORY: Family History  Problem Relation Age of Onset   Alzheimer's disease Mother    Stroke Mother    Hypertension Mother    Arthritis Mother    Polycythemia Mother    Colon cancer Father    Thyroid cancer Sister    Dementia Brother        Early   Asthma Brother    Thyroid cancer Brother    Cancer Paternal Aunt        Brain   Cancer Paternal Uncle        Brain   Heart attack Maternal Grandmother    Hypertension Maternal Grandmother    CAD Maternal Grandmother    Diabetes Maternal Grandfather    Lung cancer Maternal Grandfather    Alzheimer's disease Paternal Grandmother    Hypertension Paternal Grandmother    Hyperlipidemia Paternal Grandmother    Diabetes Paternal Grandfather     SOCIAL HISTORY:  Social History   Socioeconomic History   Marital status: Married    Spouse name: Not on file   Number of children: 4   Years of education: Not on file   Highest education level: Not on file  Occupational History   Occupation: Education administrator    Comment: [employer].  Tobacco Use   Smoking status: Never   Smokeless tobacco: Never  Vaping Use   Vaping Use: Never used  Substance and  Sexual Activity   Alcohol use: Yes    Comment: 2x month   Drug use: No   Sexual activity: Yes  Other Topics Concern   Not on file  Social History Narrative   Not on file   Social Determinants of Health   Financial Resource Strain: Not on file  Food Insecurity: Not on file  Transportation Needs: Not on file  Physical Activity: Not on file  Stress: Not on file  Social Connections: Not on file  Intimate Partner Violence: Not on file     PHYSICAL EXAM  Vitals:   10/05/21 0817  BP: 133/88  Pulse: 68  Weight: 153 lb 6.4 oz (69.6 kg)  Height: '5\' 6"'$  (1.676 m)    Body mass index is 24.76 kg/m.   General: The patient is well-developed and well-nourished and in no acute distress  Musculoskeletal:  Back is nontender  Neurologic Exam  Mental status: The patient is alert and oriented x 3 at the time of the examination. The patient has apparent normal recent and remote memory, with an apparently normal attention span and concentration ability.   Speech is normal.  Cranial nerves: Extraocular movements show mildly reduced ability to abduct the right eye.  She has diplopia when she looks to the right but not to the left.  The diplopia is binocular.  Either eye can read across the room..  Trapezius and sternocleidomastoid strength is normal. No dysarthria is noted.  Normal facial strength.  No obvious hearing deficits are noted.  Motor:  Muscle bulk is normal.   She has mildly increased muscle tone in the legs, left greater than right.. Strength is  5 / 5 in arms and right leg.   4+ to 5 left leg.  Reduced RAM in left hand but normal strength  Sensory: Sensory testing is intact to touch and vibration in  the legs Coordination: She has good finger-nose-finger bilaterally.  Heel-to-shin is reduced, worse on the left.  Gait and station: Station is normal.  The gait is moderately wide and she has a mild left foot drop.  She has difficulty taking steps without her walking sticks (just a  couple weeks ago was able to take steps without them).  She cannot tandem walk.  Romberg was borderline.    Reflexes: Deep tendon reflexes are brisk bilaterally in the legs.  The deep tendon reflex spreadsat the knee, left greater than right.  No ankle clonus.     DIAGNOSTIC DATA (LABS, IMAGING, TESTING) - I reviewed patient records, labs, notes, testing and imaging myself where available.  Lab Results  Component Value Date   WBC 10.5 04/20/2021   HGB 13.5 04/20/2021   HCT 40.9 04/20/2021   MCV 91 04/20/2021   PLT 304 04/20/2021      Component Value Date/Time   NA 144 04/20/2021 1645   K 4.4 04/20/2021 1645   CL 104 04/20/2021 1645   CO2 23 04/20/2021 1645   GLUCOSE 92 04/20/2021 1645   GLUCOSE 96 12/03/2018 1204   BUN 9 04/20/2021 1645   CREATININE 0.67 04/20/2021 1645   CALCIUM 9.7 04/20/2021 1645   PROT 7.2 04/20/2021 1645   ALBUMIN 4.8 04/20/2021 1645   AST 19 04/20/2021 1645   ALT 17 04/20/2021 1645   ALKPHOS 65 04/20/2021 1645   BILITOT <0.2 04/20/2021 1645   GFRNONAA 104 03/31/2019 1617   GFRAA 120 03/31/2019 1617      ASSESSMENT AND PLAN    1. Multiple sclerosis ([place])   2. High risk medication use   3. Gait disorder   4. Diplopia   5. Left leg weakness   6. Other fatigue        1. Continue Ocrevus.  At the next visit, we will check lab work (IgG/IgM, CD20 and CBC with differential) 2.   She is advised to continue to be active and exercises as tolerated.  She will continue to use the walking sticks for balance 3.   Trial of armodafinil for MS related fatigue.   If not better, switch to a stimulant such as Ritalin or Adderall 4.   Return in 6 months or sooner based on DMT decision or if there are new or worsening neurologic symptoms or based on the results of the MRI.    Sheliah Fiorillo A. Felecia Shelling, MD, PhD 2/29/7989, 2:11 AM Certified in Neurology, Clinical Neurophysiology, Sleep Medicine, Pain Medicine and Neuroimaging  [facility] [address] [place], [place] [address] [phone]

## 2021-10-11 DIAGNOSIS — H9113 Presbycusis, bilateral: Secondary | ICD-10-CM | POA: Insufficient documentation

## 2021-10-11 DIAGNOSIS — H9313 Tinnitus, bilateral: Secondary | ICD-10-CM | POA: Insufficient documentation

## 2021-10-18 ENCOUNTER — Ambulatory Visit: Payer: PRIVATE HEALTH INSURANCE | Admitting: Family Medicine

## 2021-11-06 ENCOUNTER — Encounter: Payer: Self-pay | Admitting: Family Medicine

## 2021-12-15 ENCOUNTER — Encounter: Payer: Self-pay | Admitting: Family Medicine

## 2021-12-15 DIAGNOSIS — G902 Horner's syndrome: Secondary | ICD-10-CM

## 2022-01-17 ENCOUNTER — Encounter: Payer: Self-pay | Admitting: Family Medicine

## 2022-01-18 ENCOUNTER — Telehealth: Payer: Self-pay | Admitting: Family Medicine

## 2022-01-18 NOTE — Telephone Encounter (Signed)
Caller Name: DRI Call back phone #: [phone]  Reason for Call: Pts appointment was cancelled due to [facility] denying authorization. Said there was insuffiate noting as to why pt needed. To get rescheduled please go through [employer] to  send more info ref #[id number]

## 2022-01-19 ENCOUNTER — Telehealth: Payer: Self-pay | Admitting: Family Medicine

## 2022-01-19 ENCOUNTER — Inpatient Hospital Stay: Admission: RE | Admit: 2022-01-19 | Payer: No Typology Code available for payment source | Source: Ambulatory Visit

## 2022-01-19 NOTE — Telephone Encounter (Signed)
Lft VM to rtn call. Dm/cma  

## 2022-01-19 NOTE — Telephone Encounter (Signed)
Spoke to patient and she will talk to provider at her upcoming appointment on 01/25/22.  Dm/cma

## 2022-01-19 NOTE — Telephone Encounter (Signed)
Pt is returning your call . Pt said call back or lvm for about your message

## 2022-01-19 NOTE — Telephone Encounter (Signed)
Pt called and stated she is returning your call

## 2022-01-25 ENCOUNTER — Encounter: Payer: Self-pay | Admitting: Family Medicine

## 2022-01-25 ENCOUNTER — Ambulatory Visit (INDEPENDENT_AMBULATORY_CARE_PROVIDER_SITE_OTHER): Payer: No Typology Code available for payment source | Admitting: Family Medicine

## 2022-01-25 VITALS — BP 122/70 | HR 56 | Temp 96.8°F | Ht 66.0 in | Wt 156.0 lb

## 2022-01-25 DIAGNOSIS — E039 Hypothyroidism, unspecified: Secondary | ICD-10-CM

## 2022-01-25 DIAGNOSIS — G35 Multiple sclerosis: Secondary | ICD-10-CM

## 2022-01-25 DIAGNOSIS — L821 Other seborrheic keratosis: Secondary | ICD-10-CM | POA: Insufficient documentation

## 2022-01-25 DIAGNOSIS — E785 Hyperlipidemia, unspecified: Secondary | ICD-10-CM | POA: Diagnosis not present

## 2022-01-25 DIAGNOSIS — Z0000 Encounter for general adult medical examination without abnormal findings: Secondary | ICD-10-CM

## 2022-01-25 DIAGNOSIS — N393 Stress incontinence (female) (male): Secondary | ICD-10-CM

## 2022-01-25 LAB — LIPID PANEL
Cholesterol: 260 mg/dL — ABNORMAL HIGH (ref 0–200)
HDL: 64.6 mg/dL (ref >39.00)
LDL Cholesterol: 175 mg/dL — ABNORMAL HIGH (ref 0–99)
NonHDL: 195.61
Total CHOL/HDL Ratio: 4
Triglycerides: 101 mg/dL (ref 0.0–149.0)
VLDL: 20.2 mg/dL (ref 0.0–40.0)

## 2022-01-25 LAB — TSH: TSH: 0.82 u[IU]/mL (ref 0.35–5.50)

## 2022-01-25 NOTE — Progress Notes (Signed)
[facility] [address] [address] Dept: [phone] Dept Fax: [phone]  Annual Physical Visit  Subjective:    Patient ID: Samantha Church, female    DOB: 12/19/1960, 62 y.o..   MRN: [id number]  Chief Complaint  Patient presents with   Annual Exam    CPE/labs.   Fasting today.  No concerns.     History of Present Illness:  Patient is in today for an annual physical/preventative visit.  Samantha Church has a history of multiple sclerosis. She was diagnosed in 2007 after she had sudden onset of right sided numbness and altered sensation. She sees Dr. Felecia Shelling (neurology). Over this past year, she developed an issue with diplopia. She is now receiving Ocrevus infusions every 6 months. She has not noted any signs of infections related to this treatment.   Samantha Church has a history of hypothyroidism. She is managed on NP Thyroid (desiccated thyroid) 60 mg daily. She has been off of her biotin so she can have this tested today.   Samantha Church has a history of borderline hyperlipidemia.  Review of Systems  Constitutional:  Negative for chills, diaphoresis, fever, malaise/fatigue and weight loss.  HENT:  Negative for congestion, ear discharge, ear pain, hearing loss, sinus pain and sore throat.   Eyes:  Positive for double vision. Negative for blurred vision, pain, discharge and redness.       Had double vision last year. Continues to work with ophthalmology regarding evaluation to be sure this is an aspect of her MS and not some other cause.  Respiratory:  Negative for cough, shortness of breath and wheezing.   Cardiovascular:  Negative for chest pain and palpitations.  Gastrointestinal:  Positive for heartburn. Negative for abdominal pain, constipation, diarrhea, nausea and vomiting.       Has heartburn periodically. Avoids spicy foods.  Genitourinary:        Occasional urinary incontinence with coughing or laughing.   Musculoskeletal:  Negative for back pain, joint pain, myalgias and neck pain.  Skin:  Negative for rash.  Neurological:  Positive for weakness.       Notes mild upper extremity weakness. Has a history of some gait instability. Uses walking sticks to assist.  Psychiatric/Behavioral:  Negative for depression. The patient is not nervous/anxious.    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   Diplopia 04/20/2021   Balance problem 04/20/2021   Osteopenia 03/31/2021   Borderline hyperlipidemia 01/17/2021   History of Lyme disease 06/22/2020   High risk medication use 07/25/2018   Left leg weakness 10/14/2015   Hypothyroidism (acquired) 11/03/2014   Other fatigue 11/03/2014   Gait disorder 11/03/2014   Multiple sclerosis ([place]) 09/01/2008   Past Surgical History:  Procedure Laterality Date   AUGMENTATION MAMMAPLASTY Bilateral 2003   saline   BREAST IMPLANT REMOVAL Bilateral 02/17/2019   BREAST SURGERY  2002   COLONOSCOPY  2008   DILATATION & CURRETTAGE/HYSTEROSCOPY WITH RESECTOCOPE N/A 10/09/2012   Procedure: DILATATION & CURETTAGE/HYSTEROSCOPY WITH RESECTOCOPE;  Surgeon: [name], MD;  Location: [facility];  Service: Gynecology;  Laterality: N/A;  RESECTION with Versapoint    KNEE ARTHROSCOPY  [place] SURGERY  2019   fibroidectomy   Family History  Problem Relation Age of Onset   Alzheimer's disease Mother    Stroke Mother  Hypertension Mother    Arthritis Mother    Polycythemia Mother    Colon cancer Father    Thyroid cancer Sister    Dementia Brother        Early   Asthma Brother    Thyroid cancer Brother    Cancer Paternal Aunt        Brain   Cancer Paternal Uncle        Brain   Heart attack Maternal Grandmother    Hypertension Maternal Grandmother    CAD Maternal Grandmother    Diabetes Maternal  Grandfather    Lung cancer Maternal Grandfather    Alzheimer's disease Paternal Grandmother    Hypertension Paternal Grandmother    Hyperlipidemia Paternal Grandmother    Diabetes Paternal Grandfather    Outpatient Medications Prior to Visit  Medication Sig Dispense Refill   Alpha-Lipoic Acid 600 MG TABS      Ascorbic Acid 500 MG/15ML SYRP Take by mouth.     Biotin 100 MG/GM POWD      Cholecalciferol (VITAMIN D-3) 5000 units TABS Take 1 tablet by mouth daily.     Cyanocobalamin (B-12) 3000 MCG LOZG      Magnesium (OPTIMAG 125) 125 MG CAPS Take 2 capsules by mouth daily.     Metamucil Fiber CHEW Chew 1 each by mouth daily.     Multiple Vitamins-Minerals (MULTIVITAMIN ADULT PO) Take by mouth.     NP THYROID 60 MG tablet TAKE ONE TABLET BY MOUTH DAILY BEFORE BREAKFAST 90 tablet 3   ocrelizumab 300 mg in sodium chloride 0.9 % 250 mL Inject 300 mg into the vein once.     Armodafinil 200 MG TABS One po qAM (Patient not taking: Reported on 01/25/2022) 30 tablet 5   No facility-administered medications prior to visit.   No Known Allergies    Objective:   Today's Vitals   01/25/22 0824  BP: 122/70  Pulse: (!) 56  Temp: (!) 96.8 F (36 C)  TempSrc: Temporal  SpO2: 97%  Weight: 156 lb (70.8 kg)  Height: '5\' 6"'$  (1.676 m)   Body mass index is 25.18 kg/m.   General: Well developed, well nourished. No acute distress. HEENT: Normocephalic, non-traumatic.Conjunctiva clear. External ears normal. EAC and   TMs normal bilaterally. Nose clear without congestion or rhinorrhea. Mucous membranes moist.   Oropharynx clear. Good dentition. Neck: Supple. No lymphadenopathy. No thyromegaly. Lungs: Clear to auscultation bilaterally. No wheezing, rales or rhonchi. CV: RRR without murmurs or rubs. Pulses 2+ bilaterally. Abdomen: Soft, non-tender. Bowel sounds positive, normal pitch and frequency. No hepatosplenomegaly.   No rebound or guarding. Back: Straight. No CVA tenderness  bilaterally. Extremities: Full ROM. No joint swelling or tenderness. No edema noted. Skin: Warm and dry. No rashes. Psych: Alert and oriented. Normal mood and affect.  Health Maintenance Due  Topic Date Due   Zoster Vaccines- Shingrix (1 of 2) Never done     Assessment & Plan:   1. Annual physical exam Overall health is very good. Samantha Church is exercising on a stationary bike for ~ 109 min. most days. She is trying to increase this with time. We discussed recommended screenings and immunizations. She should consider Shingrix vaccination in light of her Ocrevus therapy. Dr. Felecia Shelling recommended she do such midway between her infusions.  2. Multiple sclerosis ([place]) Stable on Ocrevus. No recurrence of diplopia.  3. Hypothyroidism (acquired) I will check her TSH today. She will continue NP Thyroid (desiccated thyroid) 60 mg daily.  - TSH  4.  Borderline hyperlipidemia We will reassess lipids today.  - Lipid panel   Return in about 1 year (around 01/26/2023) for Reassessment.   Haydee Salter, MD

## 2022-01-27 ENCOUNTER — Encounter: Payer: Self-pay | Admitting: Family Medicine

## 2022-01-27 DIAGNOSIS — H532 Diplopia: Secondary | ICD-10-CM

## 2022-01-27 DIAGNOSIS — G35 Multiple sclerosis: Secondary | ICD-10-CM

## 2022-02-09 ENCOUNTER — Other Ambulatory Visit: Payer: Self-pay | Admitting: Gastroenterology

## 2022-02-09 DIAGNOSIS — Z800 Family history of malignant neoplasm of digestive organs: Secondary | ICD-10-CM

## 2022-02-09 DIAGNOSIS — Q438 Other specified congenital malformations of intestine: Secondary | ICD-10-CM

## 2022-03-06 ENCOUNTER — Ambulatory Visit: Payer: No Typology Code available for payment source

## 2022-03-06 ENCOUNTER — Ambulatory Visit
Admission: RE | Admit: 2022-03-06 | Discharge: 2022-03-06 | Disposition: A | Discharge status: Home / Self Care | Payer: No Typology Code available for payment source | Source: Ambulatory Visit | Attending: Urgent Care | Admitting: Urgent Care

## 2022-03-06 VITALS — BP 135/86 | HR 62 | Temp 98.6°F | Resp 18

## 2022-03-06 DIAGNOSIS — M25511 Pain in right shoulder: Secondary | ICD-10-CM

## 2022-03-06 DIAGNOSIS — M25512 Pain in left shoulder: Secondary | ICD-10-CM

## 2022-03-06 MED ORDER — NAPROXEN 375 MG PO TABS: 375.0000 mg | ORAL_TABLET | Freq: Two times a day (BID) | ORAL | 0 refills | Status: DC

## 2022-03-06 MED ORDER — TIZANIDINE HCL 4 MG PO TABS: 4.0000 mg | ORAL_TABLET | Freq: Every day | ORAL | 0 refills | Status: DC

## 2022-03-06 NOTE — ED Provider Notes (Signed)
[facility]  Note:  This document was prepared using Systems analyst and may include unintentional dictation errors.  MRN: [id number] DOB: 09/30/1960  Subjective:   Samantha Church is a 62 y.o. female presenting for 64-monthhistory of persistent left shoulder pain.  Pain is intermittent, mild.  No fall, trauma, history of musculoskeletal disorders of the shoulder.   No current facility-administered medications for this encounter.  Current Outpatient Medications:    Alpha-Lipoic Acid 600 MG TABS, , Disp: , Rfl:    Ascorbic Acid 500 MG/15ML SYRP, Take by mouth., Disp: , Rfl:    Biotin 100 MG/GM POWD, , Disp: , Rfl:    Cholecalciferol (VITAMIN D-3) 5000 units TABS, Take 1 tablet by mouth daily., Disp: , Rfl:    Cyanocobalamin (B-12) 3000 MCG LOZG, , Disp: , Rfl:    Magnesium (OPTIMAG 125) 125 MG CAPS, Take 2 capsules by mouth daily., Disp: , Rfl:    Metamucil Fiber CHEW, Chew 1 each by mouth daily., Disp: , Rfl:    Multiple Vitamins-Minerals (MULTIVITAMIN ADULT PO), Take by mouth., Disp: , Rfl:    NP THYROID 60 MG tablet, TAKE ONE TABLET BY MOUTH DAILY BEFORE BREAKFAST, Disp: 90 tablet, Rfl: 3   ocrelizumab 300 mg in sodium chloride 0.9 % 250 mL, Inject 300 mg into the vein once., Disp: , Rfl:    No Known Allergies  Past Medical History:  Diagnosis Date   Anemia    Hortons syndrome    Hypothyroidism    Dr. AElyse Hsu  Lyme disease    Multiple sclerosis ([place]    Neuromuscular disorder ([place]    MS   Vision abnormalities      Past Surgical History:  Procedure Laterality Date   AUGMENTATION MAMMAPLASTY Bilateral 2003   saline   BREAST IMPLANT REMOVAL Bilateral 02/17/2019   BREAST SURGERY  2002   COLONOSCOPY  2008   DILATATION & CURRETTAGE/HYSTEROSCOPY WITH RESECTOCOPE N/A 10/09/2012   Procedure: DILATATION & CURETTAGE/HYSTEROSCOPY WITH RESECTOCOPE;  Surgeon: GMarylynn Pearson MD;  [facility];  Service: Gynecology;  Laterality: N/A;   RESECTION with Versapoint    KNEE ARTHROSCOPY  [place] 2019   fibroidectomy    Family History  Problem Relation Age of Onset   Alzheimer's disease Mother    Stroke Mother    Hypertension Mother    Arthritis Mother    Polycythemia Mother    Colon cancer Father    Thyroid cancer Sister    Dementia Brother        Early   Asthma Brother    Thyroid cancer Brother    Cancer Paternal Aunt        Brain   Cancer Paternal Uncle        Brain   Heart attack Maternal Grandmother    Hypertension Maternal Grandmother    CAD Maternal Grandmother    Diabetes Maternal Grandfather    Lung cancer Maternal Grandfather    Alzheimer's disease Paternal Grandmother    Hypertension Paternal Grandmother    Hyperlipidemia Paternal Grandmother    Diabetes Paternal Grandfather     Social History   Tobacco Use   Smoking status: Never   Smokeless tobacco: Never  Vaping Use   Vaping Use: Never used  Substance Use Topics   Alcohol use: Yes    Comment: 2x month   Drug use: No    ROS   Objective:   Vitals: BP 135/86 (BP Location:  Right Arm)   Pulse 62   Temp 98.6 F (37 C) (Oral)   Resp 18   LMP 02/10/2015   SpO2 98%   Physical Exam Constitutional:      General: She is not in acute distress.    Appearance: Normal appearance. She is well-developed. She is not ill-appearing, toxic-appearing or diaphoretic.  HENT:     Head: Normocephalic and atraumatic.     Right Ear: External ear normal.     Left Ear: External ear normal.     Nose: Nose normal.     Mouth/Throat:     Mouth: Mucous membranes are moist.  Eyes:     General: No scleral icterus.       Right eye: No discharge.        Left eye: No discharge.     Extraocular Movements: Extraocular movements intact.  Cardiovascular:     Rate and Rhythm: Normal rate.  Pulmonary:     Effort: Pulmonary effort is normal.  Musculoskeletal:     Left shoulder: Tenderness (pain extends into the left trapezius) and  bony tenderness present. No swelling, deformity, effusion, laceration or crepitus. Decreased range of motion (movements above shoulder height). Normal strength.     Cervical back: Normal range of motion and neck supple. No swelling, edema, deformity, erythema, signs of trauma, lacerations, rigidity, spasms, torticollis, tenderness, bony tenderness or crepitus. No pain with movement, spinous process tenderness or muscular tenderness. Normal range of motion.  Skin:    General: Skin is warm and dry.  Neurological:     General: No focal deficit present.     Mental Status: She is alert and oriented to person, place, and time.     Cranial Nerves: No cranial nerve deficit.     Motor: No weakness.     Coordination: Coordination normal.     Gait: Gait normal.     Deep Tendon Reflexes: Reflexes normal.  Psychiatric:        Mood and Affect: Mood normal.        Behavior: Behavior normal.     DG Shoulder Left  Result Date: 03/06/2022 CLINICAL DATA:  Shoulder pain EXAM: LEFT SHOULDER - 2+ VIEW COMPARISON:  None Available. FINDINGS: There is no evidence of fracture or dislocation. There is no evidence of arthropathy or other focal bone abnormality. Soft tissues are unremarkable. IMPRESSION: No fracture or dislocation of the left shoulder. Joint spaces are preserved. Electronically Signed   By: Delanna Ahmadi M.D.   On: 03/06/2022 11:04     Assessment and Plan :   PDMP not reviewed this encounter.  1. Acute pain of right shoulder     Recommended conservative management.  Discussed possibility of rotator cuff syndrome, shoulder strain, inflammatory pain and advised NSAID, modification of shoulder use.  Follow-up with an orthopedist soon as possible.  Counseled patient on potential for adverse effects with medications prescribed/recommended today, ER and return-to-clinic precautions discussed, patient verbalized understanding.    Jaynee Eagles, [place] 03/06/22 1549

## 2022-03-06 NOTE — ED Triage Notes (Signed)
Pt c/o left shoulder pain x "couple of months"-denies injury-worse with movement-NAD-steady gait

## 2022-03-13 ENCOUNTER — Other Ambulatory Visit: Payer: No Typology Code available for payment source

## 2022-03-27 ENCOUNTER — Other Ambulatory Visit: Payer: No Typology Code available for payment source

## 2022-04-05 NOTE — Progress Notes (Addendum)
[facility]  PATIENT: Samantha Church DOB: [birth year]  REFERRING DOCTOR OR PCP:  Deatra James SOURCE: Patient and records from [facility] neurology.  _________________________________   HISTORICAL  CHIEF COMPLAINT:  Chief Complaint  Patient presents with   Room 10    Pt is here Alone. Pt states that every thing has been okay since last appointment. Pt states that she has some Fatigue. Pt states that she has foot drop and has some weakness in her legs.     HISTORY OF PRESENT ILLNESS:  Samantha Church is a 62 year old woman with relapsing remitting multiple sclerosis diagnosed in 2007.    Update 04/06/22 She had a relapse earlier this year and went on Ocrevus.   Next infusion is mid May 2024  Diplopia improved but not resolved.    She has no diplopia in primary gaze but notes on left gaze.   Monocular vision is symmetric   Balance is still off and she has more numbness in legs.    She has a left foot drop - worse with longer distance..    We discussed an AFO or L300.   The  left leg has weakness, spasticity and reduced coordination.   Her left hand is slightly weak but coordinated.    She uses two walking sicks and can go 500 feet without a break.       She uses the bannister on stairs.   She has some spasticity.  She stopped tizanidine  Bladder function is the same with urgency but no incontinence.   No recent UTI.   She has mild hesitancy.  She is very fatigued currenly   We had tried Armodafinil 200 - but made jittery and did not help fatigue.  She has some sleepiness.   Sleep is not restorative so she takes a 2 hour nap with some benefit.  .   She sleeps 8 hours at night and then sleeps 2 hours most afternoons.The Home Sleep test showed AHI = 1.2 (normal).    Mood is doing about the same.  Cognition ok though has reduced focus when tired    EPWORTH SLEEPINESS SCALE  On a scale of 0 - 3 what is the chance of dozing:  Sitting and Reading:   1 --- 2 if no  nap Watching TV:    1 --- 2 if no nap Sitting inactive in a public place: 0 Passenger in car for one hour: 3 Lying down to rest in the afternoon: 3 Sitting and talking to someone: 0 Sitting quietly after lunch:  0 In a car, stopped in traffic:  0  Total (out of 24):   9/24 if she takes a scheduled nap.   11/24 if she does not take a nap  MS History:    She was diagnosed in 2007 after presenting with right sided numbness and mild clumsiness / allodynia.    MRI was consistent with MS and she was started with Betaseron.  She then switched to Copaxone but had exacerbations and switched to Tysabri.     She converted to Tysabri but became JCV Ab positive and switched to Gilenya.   Due to exacerbations on Gilenya, she switched back to Tysabri x one year and then switched to Tecfidera (in 2014).   She switched to generic DMF in 2020.   After a relapse with diplopia in 2023 she started Ocrevus.     IMAGING MRI brain 04/08/2019 shows multiple T2/FLAIR hyperintense foci in the hemispheres, brainstem and  cerebellum in a pattern configuration consistent with chronic demyelinating plaque associated with multiple sclerosis.  None of the foci appears to be acute and they do not enhance.  Compared to the MRI dated 12/24/2015, there are no new lesions.  MRI cervical spine 04/08/2019 shows T2 hyperintense foci within the spinal cord anteriorly to the right adjacent to C2, anterolaterally to the right adjacent to C3, laterally to the left adjacent to C3-C4 to C4, laterally to the left at C5, posterolaterally to the right at C6, and to the left at T1..   When compared to the MRI from 04/26/2016, there are no new lesions.  None of the foci enhance.  Mild degenerative disc changes and spondylosis at C3-C4, C4-C5 and C5-C6.  There is no nerve root compression or spinal stenosis.  This appears fairly stable compared to the previous MRI.  MRI thoracic spine 04/08/2019 shows foci within the spinal cord at T8-T9 and T12 that  were also present on the 06/02/2015 MRI.  These are consistent with demyelinating plaque associated with multiple sclerosis.    Mild degenerative changes at T7-T8 and T10-T11 that do not lead to nerve root compression  MRI of the brain 05/04/2021 showed T2/FLAIR hyperintense foci in the brainstem, cerebellum and hemispheres in a pattern consistent with chronic demyelinating plaque associated with multiple sclerosis. None of the foci enhance. However, one focus in the posterior pons on the right is new compared to the 2021 MRI and show subtle restricted diffusion. It is in a location that could cause diplopia    REVIEW OF SYSTEMS: Constitutional: No fevers, chills, sweats, or change in appetite.   She has fatigue Eyes: No visual changes, double vision, eye pain Ear, nose and throat: No hearing loss, ear pain, nasal congestion, sore throat Cardiovascular: No chest pain, palpitations Respiratory:  No shortness of breath at rest or with exertion.   No wheezes GastrointestinaI: No nausea, vomiting, diarrhea, abdominal pain, fecal incontinence Genitourinary:  No dysuria, urinary retention or frequency.  No nocturia. Musculoskeletal:  No neck pain, back pain Integumentary: No rash, pruritus, skin lesions Neurological: as above Psychiatric: No depression at this time.  No anxiety Endocrine: She has hypothyroidism.  No palpitations, diaphoresis, change in appetite, change in weigh or increased thirst Hematologic/Lymphatic:  No anemia, purpura, petechiae. Allergic/Immunologic: No itchy/runny eyes, nasal congestion, recent allergic reactions, rashes  ALLERGIES: No Known Allergies  HOME MEDICATIONS:  Current Outpatient Medications:    Alpha-Lipoic Acid 600 MG TABS, , Disp: , Rfl:    Ascorbic Acid 500 MG/15ML SYRP, Take by mouth., Disp: , Rfl:    Biotin 100 MG/GM POWD, , Disp: , Rfl:    Cholecalciferol (VITAMIN D-3) 5000 units TABS, Take 1 tablet by mouth daily., Disp: , Rfl:    Cyanocobalamin (B-12)  3000 MCG LOZG, , Disp: , Rfl:    Magnesium (OPTIMAG 125) 125 MG CAPS, Take 2 capsules by mouth daily., Disp: , Rfl:    Metamucil Fiber CHEW, Chew 1 each by mouth daily., Disp: , Rfl:    methylphenidate (RITALIN) 5 MG tablet, Take 1 tablet (5 mg total) by mouth 2 (two) times daily., Disp: 60 tablet, Rfl: 0   Multiple Vitamins-Minerals (MULTIVITAMIN ADULT PO), Take by mouth., Disp: , Rfl:    NP THYROID 60 MG tablet, TAKE ONE TABLET BY MOUTH DAILY BEFORE BREAKFAST, Disp: 90 tablet, Rfl: 3   ocrelizumab 300 mg in sodium chloride 0.9 % 250 mL, Inject 300 mg into the vein once., Disp: , Rfl:    naproxen (  NAPROSYN) 375 MG tablet, Take 1 tablet (375 mg total) by mouth 2 (two) times daily with a meal., Disp: 30 tablet, Rfl: 0   tiZANidine (ZANAFLEX) 4 MG tablet, Take 1 tablet (4 mg total) by mouth at bedtime., Disp: 30 tablet, Rfl: 0  PAST MEDICAL HISTORY: Past Medical History:  Diagnosis Date   Anemia    Hortons syndrome    Hypothyroidism    Dr. Leslie Dales   Lyme disease    Multiple sclerosis (HCC)    Neuromuscular disorder (HCC)    MS   Vision abnormalities     PAST SURGICAL HISTORY: Past Surgical History:  Procedure Laterality Date   AUGMENTATION MAMMAPLASTY Bilateral 2003   saline   BREAST IMPLANT REMOVAL Bilateral 02/17/2019   BREAST SURGERY  2002   COLONOSCOPY  2008   DILATATION & CURRETTAGE/HYSTEROSCOPY WITH RESECTOCOPE N/A 10/09/2012   Procedure: DILATATION & CURETTAGE/HYSTEROSCOPY WITH RESECTOCOPE;  Surgeon: Zelphia Cairo, MD;  Location: [facility];  Service: Gynecology;  Laterality: N/A;  RESECTION with Versapoint    KNEE ARTHROSCOPY  1996   UTERINE FIBROID SURGERY  2019   fibroidectomy    FAMILY HISTORY: Family History  Problem Relation Age of Onset   Alzheimer's disease Mother    Stroke Mother    Hypertension Mother    Arthritis Mother    Polycythemia Mother    Colon cancer Father    Thyroid cancer Sister    Dementia Brother        Early   Asthma Brother     Thyroid cancer Brother    Cancer Paternal Aunt        Brain   Cancer Paternal Uncle        Brain   Heart attack Maternal Grandmother    Hypertension Maternal Grandmother    CAD Maternal Grandmother    Diabetes Maternal Grandfather    Lung cancer Maternal Grandfather    Alzheimer's disease Paternal Grandmother    Hypertension Paternal Grandmother    Hyperlipidemia Paternal Grandmother    Diabetes Paternal Grandfather     SOCIAL HISTORY:  Social History   Socioeconomic History   Marital status: Married    Spouse name: Not on file   Number of children: 4   Years of education: Not on file   Highest education level: Not on file  Occupational History   Occupation: Nutritional therapist    Comment: [employer]  Tobacco Use   Smoking status: Never   Smokeless tobacco: Never  Vaping Use   Vaping Use: Never used  Substance and Sexual Activity   Alcohol use: Yes    Comment: 2x month   Drug use: No   Sexual activity: Yes  Other Topics Concern   Not on file  Social History Narrative   Right Handed   2 Cups of Coffee per day   I/2 Cup of Tea    Social Determinants of Health   Financial Resource Strain: Not on file  Food Insecurity: Not on file  Transportation Needs: Not on file  Physical Activity: Not on file  Stress: Not on file  Social Connections: Not on file  Intimate Partner Violence: Not on file     PHYSICAL EXAM  Vitals:   04/06/22 0813  BP: 114/70  Pulse: 65  Weight: 154 lb 8 oz (70.1 kg)  Height: 5\' 6"  (1.676 m)     Body mass index is 24.94 kg/m.   General: The patient is well-developed and well-nourished and in no acute distress  Musculoskeletal:  Back is nontender  Neurologic Exam  Mental status: The patient is alert and oriented x 3 at the time of the examination. The patient has apparent normal recent and remote memory, with an apparently normal attention span and concentration ability.   Speech is normal.  Cranial  nerves: Extraocular movements show mildly reduced ability to abduct the right eye.  She has mild diplopia when she looks to the eft.   Either eye can read across the room and colors are symmetric..  Trapezius and sternocleidomastoid strength is normal. No dysarthria is noted.  Normal facial strength.  No obvious hearing deficits are noted.  Motor:  Muscle bulk is normal.   She has mildly increased muscle tone in the legs, left greater than right.. Strength is  5 / 5 in arms and right leg.   4+ to 5 left leg.  Reduced RAM in left hand but normal strength  Sensory: Sensory testing is intact to touch and vibration in the legs  Coordination: She has good finger-nose-finger bilaterally.  Heel-to-shin is reduced, worse on the left.  Gait and station: Station is normal.  The gait is wide and she has a mild left foot drop.  Can walk in room without sticks.  She cannot tandem walk.  Romberg was borderline.    Reflexes: Deep tendon reflexes are brisk bilaterally in the legs.  The deep tendon reflex spreadsat the knee, left greater than right.  No ankle clonus.     DIAGNOSTIC DATA (LABS, IMAGING, TESTING) - I reviewed patient records, labs, notes, testing and imaging myself where available.  Lab Results  Component Value Date   WBC 10.5 04/20/2021   HGB 13.5 04/20/2021   HCT 40.9 04/20/2021   MCV 91 04/20/2021   PLT 304 04/20/2021      Component Value Date/Time   NA 144 04/20/2021 1645   K 4.4 04/20/2021 1645   CL 104 04/20/2021 1645   CO2 23 04/20/2021 1645   GLUCOSE 92 04/20/2021 1645   GLUCOSE 96 12/03/2018 1204   BUN 9 04/20/2021 1645   CREATININE 0.67 04/20/2021 1645   CALCIUM 9.7 04/20/2021 1645   PROT 7.2 04/20/2021 1645   ALBUMIN 4.8 04/20/2021 1645   AST 19 04/20/2021 1645   ALT 17 04/20/2021 1645   ALKPHOS 65 04/20/2021 1645   BILITOT <0.2 04/20/2021 1645   GFRNONAA 104 03/31/2019 1617   GFRAA 120 03/31/2019 1617      ASSESSMENT AND PLAN    1. Multiple sclerosis  (HCC)   2. Left foot drop   3. High risk medication use   4. Gait disorder   5. Diplopia   6. Excessive daytime sleepiness   7. Other fatigue   8. Attention deficit disorder, unspecified hyperactivity presence       1.   Continue Ocrevus.   Check lab work (IgG/IgM, CD20 and CBC with differential) 2.   She is advised to continue to be active and exercises as tolerated.  She will continue to use the walking sticks for balance 3.   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    4.   It is medically necessary for her to have a left AFO to help reduce falls and improve walking.   5.   Return in 6 months or sooner based on DMT decision or if there are new or worsening neurologic symptoms or based on the results of the MRI.    Zaylen Susman A. Epimenio Foot, MD, PhD 04/06/2022, 9:05 AM Certified in  Neurology, Clinical Neurophysiology, Sleep Medicine, Pain Medicine and Neuroimaging  [facility] [address], [place], [place] [address] [phone]

## 2022-04-06 ENCOUNTER — Ambulatory Visit: Payer: No Typology Code available for payment source | Admitting: Neurology

## 2022-04-06 ENCOUNTER — Encounter: Payer: Self-pay | Admitting: Neurology

## 2022-04-06 VITALS — BP 114/70 | HR 65 | Ht 66.0 in | Wt 154.5 lb

## 2022-04-06 DIAGNOSIS — F988 Other specified behavioral and emotional disorders with onset usually occurring in childhood and adolescence: Secondary | ICD-10-CM

## 2022-04-06 DIAGNOSIS — G35 Multiple sclerosis: Secondary | ICD-10-CM

## 2022-04-06 DIAGNOSIS — G4719 Other hypersomnia: Secondary | ICD-10-CM

## 2022-04-06 DIAGNOSIS — R269 Unspecified abnormalities of gait and mobility: Secondary | ICD-10-CM

## 2022-04-06 DIAGNOSIS — Z79899 Other long term (current) drug therapy: Secondary | ICD-10-CM | POA: Diagnosis not present

## 2022-04-06 DIAGNOSIS — M21372 Foot drop, left foot: Secondary | ICD-10-CM

## 2022-04-06 DIAGNOSIS — H532 Diplopia: Secondary | ICD-10-CM

## 2022-04-06 DIAGNOSIS — R5383 Other fatigue: Secondary | ICD-10-CM

## 2022-04-06 MED ORDER — METHYLPHENIDATE HCL 5 MG PO TABS: 5.0000 mg | ORAL_TABLET | Freq: Two times a day (BID) | ORAL | 0 refills | Status: AC

## 2022-04-08 LAB — CBC WITH DIFFERENTIAL/PLATELET
Basophils Absolute: 0 10*3/uL (ref 0.0–0.2)
Basos: 0 %
EOS (ABSOLUTE): 0.1 10*3/uL (ref 0.0–0.4)
Eos: 1 %
Hematocrit: 44 % (ref 34.0–46.6)
Hemoglobin: 14.4 g/dL (ref 11.1–15.9)
Immature Grans (Abs): 0 10*3/uL (ref 0.0–0.1)
Immature Granulocytes: 0 %
Lymphocytes Absolute: 1.4 10*3/uL (ref 0.7–3.1)
Lymphs: 27 %
MCH: 29.9 pg (ref 26.6–33.0)
MCHC: 32.7 g/dL (ref 31.5–35.7)
MCV: 91 fL (ref 79–97)
Monocytes Absolute: 0.5 10*3/uL (ref 0.1–0.9)
Monocytes: 10 %
Neutrophils Absolute: 3.1 10*3/uL (ref 1.4–7.0)
Neutrophils: 62 %
Platelets: 337 10*3/uL (ref 150–450)
RBC: 4.82 x10E6/uL (ref 3.77–5.28)
RDW: 13 % (ref 11.7–15.4)
WBC: 5.1 10*3/uL (ref 3.4–10.8)

## 2022-04-08 LAB — CD20 B CELLS
% CD19-B Cells: 0 % — ABNORMAL LOW (ref 4.6–22.1)
% CD20-B Cells: 0 % — ABNORMAL LOW (ref 5.0–22.3)

## 2022-04-08 LAB — IGG, IGA, IGM
IgA/Immunoglobulin A, Serum: 72 mg/dL — ABNORMAL LOW (ref 87–352)
IgG (Immunoglobin G), Serum: 789 mg/dL (ref 586–1602)
IgM (Immunoglobulin M), Srm: 72 mg/dL (ref 26–217)

## 2022-04-18 ENCOUNTER — Ambulatory Visit: Payer: No Typology Code available for payment source | Attending: Neurology | Admitting: Physical Therapy

## 2022-04-18 DIAGNOSIS — R2681 Unsteadiness on feet: Secondary | ICD-10-CM | POA: Diagnosis not present

## 2022-04-18 DIAGNOSIS — G35 Multiple sclerosis: Secondary | ICD-10-CM | POA: Diagnosis not present

## 2022-04-18 DIAGNOSIS — M6281 Muscle weakness (generalized): Secondary | ICD-10-CM | POA: Diagnosis present

## 2022-04-18 DIAGNOSIS — R2689 Other abnormalities of gait and mobility: Secondary | ICD-10-CM | POA: Diagnosis present

## 2022-04-18 NOTE — Therapy (Signed)
OUTPATIENT PHYSICAL THERAPY NEURO EVALUATION   Patient Name: BRANYA PETROVA MRN: [id number] DOB:1960-04-14, 62 y.o., female Today's Date: 04/18/2022   PCP: Loyola Mast, MD REFERRING PROVIDER: Asa Lente, MD  END OF SESSION:  PT End of Session - 04/18/22 0933     Visit Number 1    Number of Visits 13   Plus eval   Date for PT Re-Evaluation 06/06/22    Authorization Type [employer]    PT Start Time 0931    PT Stop Time 1011    PT Time Calculation (min) 40 min    Activity Tolerance Patient tolerated treatment well    Behavior During Therapy [facility] for tasks assessed/performed             Past Medical History:  Diagnosis Date   Anemia    Hortons syndrome    Hypothyroidism    Dr. Leslie Dales   Lyme disease    Multiple sclerosis (HCC)    Neuromuscular disorder (HCC)    MS   Vision abnormalities    Past Surgical History:  Procedure Laterality Date   AUGMENTATION MAMMAPLASTY Bilateral 2003   saline   BREAST IMPLANT REMOVAL Bilateral 02/17/2019   BREAST SURGERY  2002   COLONOSCOPY  2008   DILATATION & CURRETTAGE/HYSTEROSCOPY WITH RESECTOCOPE N/A 10/09/2012   Procedure: DILATATION & CURETTAGE/HYSTEROSCOPY WITH RESECTOCOPE;  Surgeon: Zelphia Cairo, MD;  Location: [facility];  Service: Gynecology;  Laterality: N/A;  RESECTION with Versapoint    KNEE ARTHROSCOPY  1996   UTERINE FIBROID SURGERY  2019   fibroidectomy   Patient Active Problem List   Diagnosis Date Noted   SK (seborrheic keratosis) 01/25/2022   Stress incontinence, female 01/25/2022   Tinnitus of both ears 10/11/2021   Presbycusis of both ears 10/11/2021   Diplopia 04/20/2021   Balance problem 04/20/2021   Osteopenia 03/31/2021   Borderline hyperlipidemia 01/17/2021   History of Lyme disease 06/22/2020   High risk medication use 07/25/2018   Left leg weakness 10/14/2015   Hypothyroidism (acquired) 11/03/2014   Other fatigue 11/03/2014   Gait disorder 11/03/2014   Multiple sclerosis (HCC)  09/01/2008    ONSET DATE: 04/06/2022  REFERRING DIAG: G35 (ICD-10-CM) - Multiple sclerosis  THERAPY DIAG:  Unsteadiness on feet  Other abnormalities of gait and mobility  Muscle weakness (generalized)  Rationale for Evaluation and Treatment: Rehabilitation  SUBJECTIVE:                                                                                                                                                                                             SUBJECTIVE STATEMENT: Pt ambulated into  clinic w/bilateral trekking poles. Pt reports she is returning to clinic due to increased L foot drop over the past 43mo. Has frequent stumbles when she is fatigued but no falls. Pt works on her computer at home and has greatest difficulty with her foot when she is out shopping or in community setting. Gets next Ocrevus infusion in May.    Pt accompanied by: self  PERTINENT HISTORY: MRI of the brain 05/04/2021 showed T2/FLAIR hyperintense foci in the brainstem, cerebellum and hemispheres in a pattern consistent with chronic demyelinating plaque associated with multiple sclerosis. None of the foci enhance. However, one focus in the posterior pons on the right is new compared to the 2021 MRI and show subtle restricted diffusion. It is in a location that could cause diplopia   PAIN:  Are you having pain? No  PRECAUTIONS: Fall  WEIGHT BEARING RESTRICTIONS: No  FALLS: Has patient fallen in last 6 months? No  LIVING ENVIRONMENT: Lives with: lives with their spouse Lives in: House/apartment Stairs: No Has following equipment at home: Tour manager, Grab bars, and trekking poles  PLOF: Requires assistive device for independence  PATIENT GOALS: "Balance and being able to walk better and pick up this L leg"   OBJECTIVE:   COGNITION: Overall cognitive status: Within functional limits for tasks assessed   SENSATION: Pt reports numbness/tingling in BLEs (mostly in feet), light touch mostly  impacted    POSTURE: rounded shoulders and forward head   LOWER EXTREMITY MMT:  Tested in seated position   MMT Right Eval Left Eval  Hip flexion 4 3+  Hip extension    Hip abduction 4+ 4  Hip adduction 4+ 4  Hip internal rotation    Hip external rotation    Knee flexion 4 4-  Knee extension 4 4-  Ankle dorsiflexion 4 2+  Ankle plantarflexion    Ankle inversion    Ankle eversion    (Blank rows = not tested)  BED MOBILITY:  Independent per pt but difficulty swinging LLE into bed  TRANSFERS: Assistive device utilized:  Bilateral trekking poles   Sit to stand: Modified independence Stand to sit: Modified independence Noted staggered stance for sit <>stand (RLE placed posterior to LLE)   STAIRS: Level of Assistance: Modified independence Stair Negotiation Technique: Alternating Pattern  with Single Rail on Right Number of Stairs: 4  Height of Stairs: 6"  Comments: Noted minor circumduction of LLE to clear step but no instability noted  GAIT: Gait pattern: step through pattern, decreased step length- Right, decreased stance time- Left, decreased stride length, decreased hip/knee flexion- Left, decreased ankle dorsiflexion- Left, circumduction- Left, lateral lean- Right, and poor foot clearance- Left Distance walked: Various clinic distances  Assistive device utilized:  Bilateral trekking poles  Level of assistance: Modified independence Comments: Pt had single instance of scuffing LLE at end of . Pt demonstrates good coordination of poles and minor circumduction compensation of LLE.   FUNCTIONAL TESTS:   [facility] PT Assessment - 04/18/22 0956       Transfers   Five time sit to stand comments  14.21s without UE support   Staggered stance w/RLE posterior     Ambulation/Gait   Gait velocity 32.8' over 9.75s = 3.36 ft/s   w/bilateral trekking poles              TODAY'S TREATMENT:       Next Session  PATIENT EDUCATION: Education details: POC, eval findings, info on AFO vs foot-up vs Bioness, handout of HEP from previous bout of therapy  Person educated: Patient Education method: Explanation, Demonstration, and Handouts Education comprehension: verbalized understanding and needs further education  HOME EXERCISE PROGRAM: To be reviewed/updated from previous POC  [email or web address]  GOALS: Goals reviewed with patient? Yes  SHORT TERM GOALS: Target date: 05/09/2022   Pt will be independent with initial HEP for improved strength, balance, transfers and gait.  Baseline: provided handout of HEP from previous POC on eval  Goal status: INITIAL  2.  Pt will trial various braces on LLE to determine safest and least restrictive option to promote independence w/mobility.  Baseline:  Goal status: INITIAL  3.  Minibest to be assessed and STG/LTG updated  Baseline:  Goal status: INITIAL  4.  6MWT to be performed and STG/LTG updated   Baseline:  Goal status: INITIAL    LONG TERM GOALS: Target date: 05/30/2022   Pt will be independent with final HEP for improved strength, balance, transfers and gait.  Baseline:  Goal status: INITIAL  2.  Pt will improve gait velocity to at least 3.5 ft/s with LRAD for improved gait efficiency and independence  Baseline: 3.36 ft/s with trekking poles  Goal status: INITIAL  3.  MiniBest goal Baseline:  Goal status: INITIAL  4.  6MWT goal  Baseline:  Goal status: INITIAL   ASSESSMENT:  CLINICAL IMPRESSION: Patient is a 62 year old female referred to Neuro OPPT for MS.   Pt's PMH is significant for: relapsing remitting multiple sclerosis diagnosed in 2007. The following deficits were present during the exam: decreased strength, impaired balance and decreased activity tolerance. Based on asymmetry w/step length and diagnosis of MS, pt is an incr risk for falls. Balance to be  further assessed next session. Pt would benefit from skilled PT to address these impairments and functional limitations to maximize functional mobility independence.   OBJECTIVE IMPAIRMENTS: Abnormal gait, decreased balance, decreased endurance, decreased mobility, difficulty walking, decreased strength, and impaired sensation  ACTIVITY LIMITATIONS: carrying, bending, squatting, stairs, transfers, bed mobility, locomotion level, and caring for others  PARTICIPATION LIMITATIONS: driving, shopping, community activity, occupation, and yard work  PERSONAL FACTORS: Fitness, Past/current experiences, and 1 comorbidity: RRMS  are also affecting patient's functional outcome.   REHAB POTENTIAL: Good  CLINICAL DECISION MAKING: Evolving/moderate complexity  EVALUATION COMPLEXITY: Moderate  PLAN:  PT FREQUENCY: 2x/week  PT DURATION: 6 weeks  PLANNED INTERVENTIONS: Therapeutic exercises, Therapeutic activity, Neuromuscular re-education, Balance training, Gait training, Patient/Family education, Self Care, Joint mobilization, Stair training, Vestibular training, Canalith repositioning, Orthotic/Fit training, Aquatic Therapy, Dry Needling, Electrical stimulation, Manual therapy, and Re-evaluation  PLAN FOR NEXT SESSION: MiniBest and 6MWT and update goals. Trial various bracing options on LLE (AFO, foot-up and Bioness). Review and update HEP   Kincade Granberg E Camran Keady, PT, DPT 04/18/2022, 10:15 AM

## 2022-04-20 ENCOUNTER — Telehealth: Payer: Self-pay | Admitting: Neurology

## 2022-04-20 NOTE — Telephone Encounter (Signed)
[facility] states twice they have sent the request over for a PA for Ocrevus.  Leavy Cella states their records show this has not been filled since 05-23-21, they would like to know if pt is still on Ocrevus.  When calling back to [phone] please use [phone]

## 2022-04-20 NOTE — Telephone Encounter (Signed)
Message sent to [facility] to look into this.

## 2022-04-22 ENCOUNTER — Other Ambulatory Visit: Payer: Self-pay | Admitting: Internal Medicine

## 2022-04-25 ENCOUNTER — Telehealth: Payer: Self-pay | Admitting: Physical Therapy

## 2022-04-25 ENCOUNTER — Ambulatory Visit: Payer: No Typology Code available for payment source | Admitting: Physical Therapy

## 2022-04-25 DIAGNOSIS — M6281 Muscle weakness (generalized): Secondary | ICD-10-CM

## 2022-04-25 DIAGNOSIS — R2681 Unsteadiness on feet: Secondary | ICD-10-CM

## 2022-04-25 DIAGNOSIS — R2689 Other abnormalities of gait and mobility: Secondary | ICD-10-CM

## 2022-04-25 NOTE — Telephone Encounter (Signed)
Dr. Epimenio Foot,   Lesle Chris. Kotz is being seen by PT to address left leg weakness secondary to MS. The patient would benefit from a L AFO to improve independence and efficiency with ambulation.   If you agree, please addend your most recent visit note (04/06/22) to state medical necessity for an AFO and place an order in [facility] workque in [facility] or fax the order to [phone].   Thank you, Josephine Igo, PT, DPT [facility] [address] [place], [place]  [address] Phone:  [phone] Fax:  [phone]

## 2022-04-25 NOTE — Therapy (Signed)
OUTPATIENT PHYSICAL THERAPY NEURO TREATMENT   Patient Name: Samantha Church MRN: [id number] DOB:July 26, 1960, 62 y.o., female Today's Date: 04/25/2022   PCP: Loyola Mast, MD REFERRING PROVIDER: Asa Lente, MD  END OF SESSION:  PT End of Session - 04/25/22 0928     Visit Number 2    Number of Visits 13   Plus eval   Date for PT Re-Evaluation 06/06/22    Authorization Type [employer]    PT Start Time 0927    PT Stop Time 1014    PT Time Calculation (min) 47 min    Equipment Utilized During Treatment Gait belt    Activity Tolerance Patient tolerated treatment well    Behavior During Therapy WFL for tasks assessed/performed              Past Medical History:  Diagnosis Date   Anemia    Hortons syndrome    Hypothyroidism    Dr. Leslie Dales   Lyme disease    Multiple sclerosis (HCC)    Neuromuscular disorder (HCC)    MS   Vision abnormalities    Past Surgical History:  Procedure Laterality Date   AUGMENTATION MAMMAPLASTY Bilateral 2003   saline   BREAST IMPLANT REMOVAL Bilateral 02/17/2019   BREAST SURGERY  2002   COLONOSCOPY  2008   DILATATION & CURRETTAGE/HYSTEROSCOPY WITH RESECTOCOPE N/A 10/09/2012   Procedure: DILATATION & CURETTAGE/HYSTEROSCOPY WITH RESECTOCOPE;  Surgeon: Zelphia Cairo, MD;  Location: [facility];  Service: Gynecology;  Laterality: N/A;  RESECTION with Versapoint    KNEE ARTHROSCOPY  1996   UTERINE FIBROID SURGERY  2019   fibroidectomy   Patient Active Problem List   Diagnosis Date Noted   SK (seborrheic keratosis) 01/25/2022   Stress incontinence, female 01/25/2022   Tinnitus of both ears 10/11/2021   Presbycusis of both ears 10/11/2021   Diplopia 04/20/2021   Balance problem 04/20/2021   Osteopenia 03/31/2021   Borderline hyperlipidemia 01/17/2021   History of Lyme disease 06/22/2020   High risk medication use 07/25/2018   Left leg weakness 10/14/2015   Hypothyroidism (acquired) 11/03/2014   Other fatigue 11/03/2014   Gait  disorder 11/03/2014   Multiple sclerosis (HCC) 09/01/2008    ONSET DATE: 04/06/2022  REFERRING DIAG: G35 (ICD-10-CM) - Multiple sclerosis  THERAPY DIAG:  Unsteadiness on feet  Other abnormalities of gait and mobility  Muscle weakness (generalized)  Rationale for Evaluation and Treatment: Rehabilitation  SUBJECTIVE:  SUBJECTIVE STATEMENT: Pt reports doing well, would like to walk without trekking poles. No falls.     Pt accompanied by: self  PERTINENT HISTORY: MRI of the brain 05/04/2021 showed T2/FLAIR hyperintense foci in the brainstem, cerebellum and hemispheres in a pattern consistent with chronic demyelinating plaque associated with multiple sclerosis. None of the foci enhance. However, one focus in the posterior pons on the right is new compared to the 2021 MRI and show subtle restricted diffusion. It is in a location that could cause diplopia   PAIN:  Are you having pain? No  PRECAUTIONS: Fall  WEIGHT BEARING RESTRICTIONS: No  FALLS: Has patient fallen in last 6 months? No  LIVING ENVIRONMENT: Lives with: lives with their spouse Lives in: House/apartment Stairs: No Has following equipment at home: Tour manager, Grab bars, and trekking poles  PLOF: Requires assistive device for independence  PATIENT GOALS: "Balance and being able to walk better and pick up this L leg"   OBJECTIVE:   COGNITION: Overall cognitive status: Within functional limits for tasks assessed   SENSATION: Pt reports numbness/tingling in BLEs (mostly in feet), light touch mostly impacted    POSTURE: rounded shoulders and forward head   LOWER EXTREMITY MMT:  Tested in seated position   MMT Right Eval Left Eval  Hip flexion 4 3+  Hip extension    Hip abduction 4+ 4  Hip adduction 4+ 4  Hip  internal rotation    Hip external rotation    Knee flexion 4 4-  Knee extension 4 4-  Ankle dorsiflexion 4 2+  Ankle plantarflexion    Ankle inversion    Ankle eversion    (Blank rows = not tested)  BED MOBILITY:  Independent per pt but difficulty swinging LLE into bed  TODAY'S TREATMENT:     Gait Training  Gait pattern: step through pattern, decreased hip/knee flexion- Left, decreased ankle dorsiflexion- Left, circumduction- Left, knee flexed in stance- Left, lateral hip instability, and lateral lean- Right Distance walked: 230' indoors Assistive device utilized:  [place] WalkOn AFO on L side Level of assistance: CGA Comments: Pt reported feeling as though she was being "pushed" to L side. Noted slight improvement in reduction of circumduction on L side but pt maintaining L knee flexion during stance.   Gait pattern:  Lateral gait deviations to R side, step through pattern, lateral hip instability, and lateral lean- Right Distance walked: >800' Outside on sidewalk/grass and 230' indoors  Assistive device utilized:  Townsend Spry Step on L  Level of assistance: CGA and Min A Comments: Pt much more stable w/this brace and noted improved knee extension during L stance phase. Pt required CGA-min A due to R lateral gait deviations, especially outside on sidewalk, to avoid hitting obstacles. Pt did not require circumduction of LLE to promote limb and reported feeling much more efficient during gait w/brace.   Gait pattern: step through pattern, decreased stride length, decreased hip/knee flexion- Left, decreased ankle dorsiflexion- Left, circumduction- Left, and lateral hip instability Distance walked: 115' indoors  Assistive device utilized:  foot-up brace on L Level of assistance: CGA Comments: Trialed foot-up brace to provide less-restrictive option for pt, but pt reported feeling too unstable in this brace compared to AFO   Showed Bioness cuff to pt and discussed therapeutic  interventions to be performed with this device and will plan to utilize this in future sessions. Pt verbalized understanding.       PATIENT EDUCATION: Education details: Plan for next session  Person educated:  Patient Education method: Explanation and Demonstration Education comprehension: verbalized understanding and needs further education  HOME EXERCISE PROGRAM: To be reviewed/updated from previous POC  Access code: [facility]  GOALS: Goals reviewed with patient? Yes  SHORT TERM GOALS: Target date: 05/09/2022   Pt will be independent with initial HEP for improved strength, balance, transfers and gait.  Baseline: provided handout of HEP from previous POC on eval  Goal status: INITIAL  2.  Pt will trial various braces on LLE to determine safest and least restrictive option to promote independence w/mobility.  Baseline:  Goal status: INITIAL  3.  Minibest to be assessed and STG/LTG updated  Baseline:  Goal status: INITIAL  4.  to be performed and STG/LTG updated   Baseline:  Goal status: INITIAL    LONG TERM GOALS: Target date: 05/30/2022   Pt will be independent with final HEP for improved strength, balance, transfers and gait.  Baseline:  Goal status: INITIAL  2.  Pt will improve gait velocity to at least 3.5 ft/s with LRAD for improved gait efficiency and independence  Baseline: 3.36 ft/s with trekking poles  Goal status: INITIAL  3.  MiniBest goal Baseline:  Goal status: INITIAL  4.  goal  Baseline:  Goal status: INITIAL   ASSESSMENT:  CLINICAL IMPRESSION: Emphasis of skilled PT session on gait training w/various bracing options on LLE to promote independence and safety w/mobility. Pt prefers Townsend SpryStep AFO at this time and demonstrated the most efficient gait kinematics w/this brace compared to less-restrictive AFOs. Will plan to request AFO order this date. Continue POC.     OBJECTIVE IMPAIRMENTS: Abnormal gait, decreased  balance, decreased endurance, decreased mobility, difficulty walking, decreased strength, and impaired sensation  ACTIVITY LIMITATIONS: carrying, bending, squatting, stairs, transfers, bed mobility, locomotion level, and caring for others  PARTICIPATION LIMITATIONS: driving, shopping, community activity, occupation, and yard work  PERSONAL FACTORS: Fitness, Past/current experiences, and 1 comorbidity: RRMS  are also affecting patient's functional outcome.   REHAB POTENTIAL: Good  CLINICAL DECISION MAKING: Evolving/moderate complexity  EVALUATION COMPLEXITY: Moderate  PLAN:  PT FREQUENCY: 2x/week  PT DURATION: 6 weeks  PLANNED INTERVENTIONS: Therapeutic exercises, Therapeutic activity, Neuromuscular re-education, Balance training, Gait training, Patient/Family education, Self Care, Joint mobilization, Stair training, Vestibular training, Canalith repositioning, Orthotic/Fit training, Aquatic Therapy, Dry Needling, Electrical stimulation, Manual therapy, and Re-evaluation  PLAN FOR NEXT SESSION: MiniBest and and update goals. Trial  Bioness. Review and update HEP. Did Alethia Berthold get AFO order?   Jill Alexanders Reika Callanan, PT, DPT 04/25/2022, 10:14 AM

## 2022-04-26 ENCOUNTER — Other Ambulatory Visit: Payer: No Typology Code available for payment source

## 2022-04-27 ENCOUNTER — Ambulatory Visit: Payer: No Typology Code available for payment source | Admitting: Physical Therapy

## 2022-04-27 DIAGNOSIS — M6281 Muscle weakness (generalized): Secondary | ICD-10-CM

## 2022-04-27 DIAGNOSIS — R2681 Unsteadiness on feet: Secondary | ICD-10-CM

## 2022-04-27 DIAGNOSIS — R2689 Other abnormalities of gait and mobility: Secondary | ICD-10-CM

## 2022-04-27 NOTE — Therapy (Signed)
OUTPATIENT PHYSICAL THERAPY NEURO TREATMENT   Patient Name: Samantha Church MRN: [id number] DOB:1960/04/01, 62 y.o., female Today's Date: 04/27/2022   PCP: Loyola Mast, MD REFERRING PROVIDER: Asa Lente, MD  END OF SESSION:  PT End of Session - 04/27/22 0849     Visit Number 3    Number of Visits 13   Plus eval   Date for PT Re-Evaluation 06/06/22    Authorization Type [employer]    PT Start Time 0847    PT Stop Time 0930    PT Time Calculation (min) 43 min    Equipment Utilized During Treatment Gait belt    Activity Tolerance Patient tolerated treatment well    Behavior During Therapy WFL for tasks assessed/performed               Past Medical History:  Diagnosis Date   Anemia    Hortons syndrome    Hypothyroidism    Dr. Leslie Dales   Lyme disease    Multiple sclerosis (HCC)    Neuromuscular disorder (HCC)    MS   Vision abnormalities    Past Surgical History:  Procedure Laterality Date   AUGMENTATION MAMMAPLASTY Bilateral 2003   saline   BREAST IMPLANT REMOVAL Bilateral 02/17/2019   BREAST SURGERY  2002   COLONOSCOPY  2008   DILATATION & CURRETTAGE/HYSTEROSCOPY WITH RESECTOCOPE N/A 10/09/2012   Procedure: DILATATION & CURETTAGE/HYSTEROSCOPY WITH RESECTOCOPE;  Surgeon: Zelphia Cairo, MD;  Location: [facility];  Service: Gynecology;  Laterality: N/A;  RESECTION with Versapoint    KNEE ARTHROSCOPY  1996   UTERINE FIBROID SURGERY  2019   fibroidectomy   Patient Active Problem List   Diagnosis Date Noted   SK (seborrheic keratosis) 01/25/2022   Stress incontinence, female 01/25/2022   Tinnitus of both ears 10/11/2021   Presbycusis of both ears 10/11/2021   Diplopia 04/20/2021   Balance problem 04/20/2021   Osteopenia 03/31/2021   Borderline hyperlipidemia 01/17/2021   History of Lyme disease 06/22/2020   High risk medication use 07/25/2018   Left leg weakness 10/14/2015   Hypothyroidism (acquired) 11/03/2014   Other fatigue 11/03/2014    Gait disorder 11/03/2014   Multiple sclerosis (HCC) 09/01/2008    ONSET DATE: 04/06/2022  REFERRING DIAG: G35 (ICD-10-CM) - Multiple sclerosis  THERAPY DIAG:  Unsteadiness on feet  Other abnormalities of gait and mobility  Muscle weakness (generalized)  Rationale for Evaluation and Treatment: Rehabilitation  SUBJECTIVE:  SUBJECTIVE STATEMENT: Pt denies acute changes. Doing well. Ambulated into clinic w/trekking poles    Pt accompanied by: self  PERTINENT HISTORY: MRI of the brain 05/04/2021 showed T2/FLAIR hyperintense foci in the brainstem, cerebellum and hemispheres in a pattern consistent with chronic demyelinating plaque associated with multiple sclerosis. None of the foci enhance. However, one focus in the posterior pons on the right is new compared to the 2021 MRI and show subtle restricted diffusion. It is in a location that could cause diplopia   PAIN:  Are you having pain? No  PRECAUTIONS: Fall  WEIGHT BEARING RESTRICTIONS: No  FALLS: Has patient fallen in last 6 months? No  LIVING ENVIRONMENT: Lives with: lives with their spouse Lives in: House/apartment Stairs: No Has following equipment at home: Tour manager, Grab bars, and trekking poles  PLOF: Requires assistive device for independence  PATIENT GOALS: "Balance and being able to walk better and pick up this L leg"   OBJECTIVE:   COGNITION: Overall cognitive status: Within functional limits for tasks assessed   SENSATION: Pt reports numbness/tingling in BLEs (mostly in feet), light touch mostly impacted    POSTURE: rounded shoulders and forward head   LOWER EXTREMITY MMT:  Tested in seated position   MMT Right Eval Left Eval  Hip flexion 4 3+  Hip extension    Hip abduction 4+ 4  Hip adduction 4+ 4  Hip  internal rotation    Hip external rotation    Knee flexion 4 4-  Knee extension 4 4-  Ankle dorsiflexion 4 2+  Ankle plantarflexion    Ankle inversion    Ankle eversion    (Blank rows = not tested)  BED MOBILITY:  Independent per pt but difficulty swinging LLE into bed  TODAY'S TREATMENT:     Ther Act  Gait pattern: step through pattern, circumduction- Left, Left hip hike, and lateral lean- Right Distance walked: 8 laps + 78' = 998'  Assistive device utilized: None Level of assistance: SBA Comments: Pt maintained pace throughout test (42-44s/lap) but did note increased circumduction of LLE w/fatigue. Pt had single incidence of LOB to R side on final lap that she was able to self-correct    Ther Ex Revised HEP (see bolded below) for improved BLE strength, single leg stability, endurance and overall mobility:  Lateral and forward eccentric heel taps from 6" step w/BUE support, x10 per side. Increased difficulty performing on LLE but no instability noted. Educated pt to perform at home in front of counter/hallway to have UE support if needed but to challenge herself w/reduced UE support as she gets stronger  Lateral, fwd and retro monster walks w/red theraband around distal quads, x4 each direction in // bars. Pt able to perform without UE support, so encouraged pt to do this at home in front of counter/hallway if she needs to correct her balance  Goblet squats in front of chair with two 3# dumbbells (this is what pt has at home). Pt unable to obtain full depth squat (touching chair) and had difficulty w/this due to not being able to shift to RLE.     PATIENT EDUCATION: Education details: Updates to HEP, plan for next session  Person educated: Patient Education method: Explanation and Demonstration Education comprehension: verbalized understanding and needs further education  HOME EXERCISE PROGRAM: Access Code: [facility] URL: [email or web address] Date:  04/27/2022 Prepared by: Alethia Berthold Tyshawn Keel  Exercises - Side Step Down with Counter Support  - 1 x daily - 7 x weekly - 3 sets -  10 reps - Forward Step Down with Heel Tap and Rail Support  - 1 x daily - 7 x weekly - 3 sets - 10 reps - Side Stepping with Resistance at Thighs  - 1 x daily - 7 x weekly - 3 sets - 10 reps - Forward Monster Walks  - 1 x daily - 7 x weekly - 3 sets - 10 reps - Backward Monster Walks  - 1 x daily - 7 x weekly - 3 sets - 10 reps - Squat with dumbbells   - 1 x daily - 7 x weekly - 3 sets - 10 reps  GOALS: Goals reviewed with patient? Yes  SHORT TERM GOALS: Target date: 05/09/2022   Pt will be independent with initial HEP for improved strength, balance, transfers and gait.  Baseline: provided handout of HEP from previous POC on eval  Goal status: INITIAL  2.  Pt will trial various braces on LLE to determine safest and least restrictive option to promote independence w/mobility.  Baseline:  Goal status: INITIAL  3.  Minibest to be assessed and STG/LTG updated  Baseline:  Goal status: INITIAL  4.  Pt will ambulate greater than or equal to 1100 feet on with LRAD mod I for improved cardiovascular endurance and BLE strength.   Baseline: 998' w/o AD and SBA Goal status: REVISED    LONG TERM GOALS: Target date: 05/30/2022   Pt will be independent with final HEP for improved strength, balance, transfers and gait.  Baseline:  Goal status: INITIAL  2.  Pt will improve gait velocity to at least 3.5 ft/s with LRAD for improved gait efficiency and independence  Baseline: 3.36 ft/s with trekking poles  Goal status: INITIAL  3.  MiniBest goal Baseline:  Goal status: INITIAL  4.  Pt will ambulate greater than or equal to 1300 feet on with LRAD mod I for improved cardiovascular endurance and BLE strength.   Baseline: 998' w/o AD and SBA Goal status: REVISED   ASSESSMENT:  CLINICAL IMPRESSION: Emphasis of skilled PT session on endurance  assessment and updating HEP to incorporate improved weight acceptance to LLE, glute and hip strength. Pt ambulated 998' on without AD and noted increased circumduction of LLE w/fatigue, but no major LOB noted. Pt very challenged by eccentric heel taps and goblet squats due to tendency to shift away from LLE, but overall performed well. Pt did report discomfort in low back w/activity, so will provide more core exercises next session. Continue POC.    OBJECTIVE IMPAIRMENTS: Abnormal gait, decreased balance, decreased endurance, decreased mobility, difficulty walking, decreased strength, and impaired sensation  ACTIVITY LIMITATIONS: carrying, bending, squatting, stairs, transfers, bed mobility, locomotion level, and caring for others  PARTICIPATION LIMITATIONS: driving, shopping, community activity, occupation, and yard work  PERSONAL FACTORS: Fitness, Past/current experiences, and 1 comorbidity: RRMS  are also affecting patient's functional outcome.   REHAB POTENTIAL: Good  CLINICAL DECISION MAKING: Evolving/moderate complexity  EVALUATION COMPLEXITY: Moderate  PLAN:  PT FREQUENCY: 2x/week  PT DURATION: 6 weeks  PLANNED INTERVENTIONS: Therapeutic exercises, Therapeutic activity, Neuromuscular re-education, Balance training, Gait training, Patient/Family education, Self Care, Joint mobilization, Stair training, Vestibular training, Canalith repositioning, Orthotic/Fit training, Aquatic Therapy, Dry Needling, Electrical stimulation, Manual therapy, and Re-evaluation  PLAN FOR NEXT SESSION: MiniBest and update goals. Trial  Bioness. Review and update HEP. Did Alethia Berthold get AFO order? Standing core exercises - pallof presses, marches w/OH hold   Masiah Lewing E Emmanuella Mirante, PT, DPT 04/27/2022, 9:32 AM

## 2022-04-28 ENCOUNTER — Other Ambulatory Visit: Payer: Self-pay | Admitting: Neurology

## 2022-04-28 DIAGNOSIS — G35D Multiple sclerosis, unspecified: Secondary | ICD-10-CM

## 2022-04-28 DIAGNOSIS — R269 Unspecified abnormalities of gait and mobility: Secondary | ICD-10-CM

## 2022-04-28 DIAGNOSIS — G35 Multiple sclerosis: Secondary | ICD-10-CM

## 2022-04-29 ENCOUNTER — Other Ambulatory Visit: Payer: Self-pay | Admitting: Internal Medicine

## 2022-05-01 ENCOUNTER — Other Ambulatory Visit: Payer: Self-pay | Admitting: *Deleted

## 2022-05-01 ENCOUNTER — Ambulatory Visit: Payer: No Typology Code available for payment source | Admitting: Internal Medicine

## 2022-05-01 ENCOUNTER — Ambulatory Visit: Payer: No Typology Code available for payment source | Admitting: Physical Therapy

## 2022-05-01 DIAGNOSIS — R2681 Unsteadiness on feet: Secondary | ICD-10-CM | POA: Diagnosis not present

## 2022-05-01 DIAGNOSIS — R2689 Other abnormalities of gait and mobility: Secondary | ICD-10-CM

## 2022-05-01 DIAGNOSIS — G35 Multiple sclerosis: Secondary | ICD-10-CM

## 2022-05-01 DIAGNOSIS — M6281 Muscle weakness (generalized): Secondary | ICD-10-CM

## 2022-05-01 MED ORDER — OCREVUS 300 MG/10ML IV SOLN: 600.0000 mg | INTRAVENOUS | 1 refills | Status: DC

## 2022-05-01 NOTE — Therapy (Signed)
OUTPATIENT PHYSICAL THERAPY NEURO TREATMENT   Patient Name: Samantha Church MRN: [id number] DOB:04-Dec-1960, 62 y.o., female Today's Date: 05/01/2022   PCP: Loyola Mast, MD REFERRING PROVIDER: Asa Lente, MD  END OF SESSION:  PT End of Session - 05/01/22 1539     Visit Number 4    Number of Visits 13   Plus eval   Date for PT Re-Evaluation 06/06/22    Authorization Type [employer]    PT Start Time 1537   Therapist running late   PT Stop Time 1624    PT Time Calculation (min) 47 min    Equipment Utilized During Treatment --    Activity Tolerance Patient tolerated treatment well    Behavior During Therapy WFL for tasks assessed/performed                Past Medical History:  Diagnosis Date   Anemia    Hortons syndrome    Hypothyroidism    Dr. Leslie Dales   Lyme disease    Multiple sclerosis (HCC)    Neuromuscular disorder (HCC)    MS   Vision abnormalities    Past Surgical History:  Procedure Laterality Date   AUGMENTATION MAMMAPLASTY Bilateral 2003   saline   BREAST IMPLANT REMOVAL Bilateral 02/17/2019   BREAST SURGERY  2002   COLONOSCOPY  2008   DILATATION & CURRETTAGE/HYSTEROSCOPY WITH RESECTOCOPE N/A 10/09/2012   Procedure: DILATATION & CURETTAGE/HYSTEROSCOPY WITH RESECTOCOPE;  Surgeon: Zelphia Cairo, MD;  Location: [facility];  Service: Gynecology;  Laterality: N/A;  RESECTION with Versapoint    KNEE ARTHROSCOPY  1996   UTERINE FIBROID SURGERY  2019   fibroidectomy   Patient Active Problem List   Diagnosis Date Noted   SK (seborrheic keratosis) 01/25/2022   Stress incontinence, female 01/25/2022   Tinnitus of both ears 10/11/2021   Presbycusis of both ears 10/11/2021   Diplopia 04/20/2021   Balance problem 04/20/2021   Osteopenia 03/31/2021   Borderline hyperlipidemia 01/17/2021   History of Lyme disease 06/22/2020   High risk medication use 07/25/2018   Left leg weakness 10/14/2015   Hypothyroidism (acquired) 11/03/2014   Other  fatigue 11/03/2014   Gait disorder 11/03/2014   Multiple sclerosis (HCC) 09/01/2008    ONSET DATE: 04/06/2022  REFERRING DIAG: G35 (ICD-10-CM) - Multiple sclerosis  THERAPY DIAG:  Unsteadiness on feet  Other abnormalities of gait and mobility  Muscle weakness (generalized)  Rationale for Evaluation and Treatment: Rehabilitation  SUBJECTIVE:  SUBJECTIVE STATEMENT: Pt reports doing well, tried her exercises without UE support and was able to perform mutliple reps prior to fatigue. No pain but does feel a twinge in her low back. No falls.     Pt accompanied by: self  PERTINENT HISTORY: MRI of the brain 05/04/2021 showed T2/FLAIR hyperintense foci in the brainstem, cerebellum and hemispheres in a pattern consistent with chronic demyelinating plaque associated with multiple sclerosis. None of the foci enhance. However, one focus in the posterior pons on the right is new compared to the 2021 MRI and show subtle restricted diffusion. It is in a location that could cause diplopia   PAIN:  Are you having pain? No  PRECAUTIONS: Fall  WEIGHT BEARING RESTRICTIONS: No  FALLS: Has patient fallen in last 6 months? No  LIVING ENVIRONMENT: Lives with: lives with their spouse Lives in: House/apartment Stairs: No Has following equipment at home: Tour manager, Grab bars, and trekking poles  PLOF: Requires assistive device for independence  PATIENT GOALS: "Balance and being able to walk better and pick up this L leg"   OBJECTIVE:   COGNITION: Overall cognitive status: Within functional limits for tasks assessed   SENSATION: Pt reports numbness/tingling in BLEs (mostly in feet), light touch mostly impacted    POSTURE: rounded shoulders and forward head   LOWER EXTREMITY MMT:  Tested in seated  position   MMT Right Eval Left Eval  Hip flexion 4 3+  Hip extension    Hip abduction 4+ 4  Hip adduction 4+ 4  Hip internal rotation    Hip external rotation    Knee flexion 4 4-  Knee extension 4 4-  Ankle dorsiflexion 4 2+  Ankle plantarflexion    Ankle inversion    Ankle eversion    (Blank rows = not tested)  BED MOBILITY:  Independent per pt but difficulty swinging LLE into bed  TODAY'S TREATMENT:     Ther Ex  Pt performed floor transfer from standard chair to floor mat x2 mod I.  On floor mat for improved core stability, UE/LE coordination and low back pain modulation: Bird dogs, x8 per side w/min A when on R knee due to imbalance. Modified to moving left leg only when on R knee for improved stability and safety when performing at home Child's pose to tall kneel transition, x12 reps. Pt performed well and reported crepitus in low back w/movement that was not painful. Educated pt on walking hands out to L and R side when in child's pose to get in a lat stretch, which pt enjoyed.  Dead bugs, x8 per side. Mod cues to maintain low back contact w/floor to ensure proper abdominal engagement, but pt unable to maintain if moving UE and legs. Regressed to moving legs only to ensure proper core stability and decreased strain on low back. Noted pt abducting RLE to perform movement, but was able to perform without abduction for a few reps w/cues to engage hip flexors.  Standing pallof presses using green resistance band, x10 per side, for improved oblique stability. Pt more challenged when holding band on R side but overall performed well. Demonstrated how to set this up at home by tying a knot in theraband and shutting the knot behind a door to ensure the band will not come loose. Pt able to teach back and demonstrate in clinic.  Standing marches w/unilateral OH hold w/3# DB for improved core stability, single leg stability and BLE strength. Pt performed x10 reps while holding 3# DB  w/RUE.  Noted increased difficulty marching w/RLE and poor eccentric control initially, requiring CGA for safety. However, w/practice, pt much more stable. Encouraged pt to perform this beside a couch/bed for safety. Pt verbalized understanding.    PATIENT EDUCATION: Education details: Updates to HEP Person educated: Patient Education method: Medical illustrator Education comprehension: verbalized understanding and needs further education  HOME EXERCISE PROGRAM: Access Code: [facility] URL: [email or web address] Date: 04/27/2022 Prepared by: Alethia Berthold Demetrios Byron  Exercises - Side Step Down with Counter Support  - 1 x daily - 7 x weekly - 3 sets - 10 reps - Forward Step Down with Heel Tap and Rail Support  - 1 x daily - 7 x weekly - 3 sets - 10 reps - Side Stepping with Resistance at Thighs  - 1 x daily - 7 x weekly - 3 sets - 10 reps - Forward Monster Walks  - 1 x daily - 7 x weekly - 3 sets - 10 reps - Backward Monster Walks  - 1 x daily - 7 x weekly - 3 sets - 10 reps - Squat with dumbbells   - 1 x daily - 7 x weekly - 3 sets - 10 reps - Bird Dog  - 1 x daily - 7 x weekly - 3 sets - 10 reps - Child's Pose Stretch  - 1 x daily - 7 x weekly - 3 sets - 10 reps - Tall Kneel Vertical Bridge  - 1 x daily - 7 x weekly - 3 sets - 10 reps - Dead Bug  - 1 x daily - 7 x weekly - 3 sets - 10 reps - Standing Anti-Rotation Press with Anchored Resistance  - 1 x daily - 7 x weekly - 3 sets - 10 reps - Standing march with overhead hold   - 1 x daily - 7 x weekly - 3 sets - 10 reps  GOALS: Goals reviewed with patient? Yes  SHORT TERM GOALS: Target date: 05/09/2022   Pt will be independent with initial HEP for improved strength, balance, transfers and gait.  Baseline: provided handout of HEP from previous POC on eval  Goal status: INITIAL  2.  Pt will trial various braces on LLE to determine safest and least restrictive option to promote independence w/mobility.  Baseline:  Goal  status: INITIAL  3.  Minibest to be assessed and STG/LTG updated  Baseline:  Goal status: INITIAL  4.  Pt will ambulate greater than or equal to 1100 feet on with LRAD mod I for improved cardiovascular endurance and BLE strength.   Baseline: 998' w/o AD and SBA Goal status: REVISED    LONG TERM GOALS: Target date: 05/30/2022   Pt will be independent with final HEP for improved strength, balance, transfers and gait.  Baseline:  Goal status: INITIAL  2.  Pt will improve gait velocity to at least 3.5 ft/s with LRAD for improved gait efficiency and independence  Baseline: 3.36 ft/s with trekking poles  Goal status: INITIAL  3.  MiniBest goal Baseline:  Goal status: INITIAL  4.  Pt will ambulate greater than or equal to 1300 feet on with LRAD mod I for improved cardiovascular endurance and BLE strength.   Baseline: 998' w/o AD and SBA Goal status: REVISED   ASSESSMENT:  CLINICAL IMPRESSION: Emphasis of skilled PT session on core stability, R hip strength and UE/LE coordination. Pt tolerated session well w/no report of low back pain. Pt demonstrates difficulty w/core activation w/coordination tasks or when stabilizing  on RLE, resulting in low back discomfort. Provided modifications of exercises for pt to perform at home for improved core stability and reduced strain on low back. Pt will continue to benefit from skilled PT services to work on dynamic gait, balance and endurance. Continue POC.    OBJECTIVE IMPAIRMENTS: Abnormal gait, decreased balance, decreased endurance, decreased mobility, difficulty walking, decreased strength, and impaired sensation  ACTIVITY LIMITATIONS: carrying, bending, squatting, stairs, transfers, bed mobility, locomotion level, and caring for others  PARTICIPATION LIMITATIONS: driving, shopping, community activity, occupation, and yard work  PERSONAL FACTORS: Fitness, Past/current experiences, and 1 comorbidity: RRMS  are also affecting  patient's functional outcome.   REHAB POTENTIAL: Good  CLINICAL DECISION MAKING: Evolving/moderate complexity  EVALUATION COMPLEXITY: Moderate  PLAN:  PT FREQUENCY: 2x/week  PT DURATION: 6 weeks  PLANNED INTERVENTIONS: Therapeutic exercises, Therapeutic activity, Neuromuscular re-education, Balance training, Gait training, Patient/Family education, Self Care, Joint mobilization, Stair training, Vestibular training, Canalith repositioning, Orthotic/Fit training, Aquatic Therapy, Dry Needling, Electrical stimulation, Manual therapy, and Re-evaluation  PLAN FOR NEXT SESSION: MiniBest and update goals. Trial  Bioness. Review and update HEP. Did Alethia Berthold get AFO order? Walking on unlevel surfaces, dual tasking    Brinton Brandel E Rece Zechman, PT, DPT 05/01/2022, 4:24 PM

## 2022-05-04 ENCOUNTER — Ambulatory Visit: Payer: No Typology Code available for payment source | Admitting: Physical Therapy

## 2022-05-04 DIAGNOSIS — M6281 Muscle weakness (generalized): Secondary | ICD-10-CM

## 2022-05-04 DIAGNOSIS — R2681 Unsteadiness on feet: Secondary | ICD-10-CM | POA: Diagnosis not present

## 2022-05-04 DIAGNOSIS — R2689 Other abnormalities of gait and mobility: Secondary | ICD-10-CM

## 2022-05-04 NOTE — Therapy (Signed)
OUTPATIENT PHYSICAL THERAPY NEURO TREATMENT   Patient Name: CECIL BIXBY MRN: [id number] DOB:[birth year], 62 y.o., female Today's Date: 05/04/2022   PCP: Loyola Mast, MD REFERRING PROVIDER: Asa Lente, MD  END OF SESSION:  PT End of Session - 05/04/22 1452     Visit Number 5    Number of Visits 13   Plus eval   Date for PT Re-Evaluation 06/06/22    Authorization Type [employer]    PT Start Time 1451   Therapist running behind   PT Stop Time 1533    PT Time Calculation (min) 42 min    Equipment Utilized During Treatment Gait belt    Activity Tolerance Patient tolerated treatment well    Behavior During Therapy WFL for tasks assessed/performed                 Past Medical History:  Diagnosis Date   Anemia    Hortons syndrome    Hypothyroidism    Dr. Leslie Dales   Lyme disease    Multiple sclerosis (HCC)    Neuromuscular disorder (HCC)    MS   Vision abnormalities    Past Surgical History:  Procedure Laterality Date   AUGMENTATION MAMMAPLASTY Bilateral 2003   saline   BREAST IMPLANT REMOVAL Bilateral 02/17/2019   BREAST SURGERY  2002   COLONOSCOPY  2008   DILATATION & CURRETTAGE/HYSTEROSCOPY WITH RESECTOCOPE N/A 10/09/2012   Procedure: DILATATION & CURETTAGE/HYSTEROSCOPY WITH RESECTOCOPE;  Surgeon: Zelphia Cairo, MD;  Location: [facility];  Service: Gynecology;  Laterality: N/A;  RESECTION with Versapoint    KNEE ARTHROSCOPY  1996   UTERINE FIBROID SURGERY  2019   fibroidectomy   Patient Active Problem List   Diagnosis Date Noted   SK (seborrheic keratosis) 01/25/2022   Stress incontinence, female 01/25/2022   Tinnitus of both ears 10/11/2021   Presbycusis of both ears 10/11/2021   Diplopia 04/20/2021   Balance problem 04/20/2021   Osteopenia 03/31/2021   Borderline hyperlipidemia 01/17/2021   History of Lyme disease 06/22/2020   High risk medication use 07/25/2018   Left leg weakness 10/14/2015   Hypothyroidism (acquired) 11/03/2014    Other fatigue 11/03/2014   Gait disorder 11/03/2014   Multiple sclerosis (HCC) 09/01/2008    ONSET DATE: 04/06/2022  REFERRING DIAG: G35 (ICD-10-CM) - Multiple sclerosis  THERAPY DIAG:  Unsteadiness on feet  Other abnormalities of gait and mobility  Muscle weakness (generalized)  Rationale for Evaluation and Treatment: Rehabilitation  SUBJECTIVE:  SUBJECTIVE STATEMENT: Pt reports doing well, has not had a chance to try her exercises yet. Is scheduled with [facility] on 5/6. No falls    Pt accompanied by: self  PERTINENT HISTORY: MRI of the brain 05/04/2021 showed T2/FLAIR hyperintense foci in the brainstem, cerebellum and hemispheres in a pattern consistent with chronic demyelinating plaque associated with multiple sclerosis. None of the foci enhance. However, one focus in the posterior pons on the right is new compared to the 2021 MRI and show subtle restricted diffusion. It is in a location that could cause diplopia   PAIN:  Are you having pain? No  PRECAUTIONS: Fall  WEIGHT BEARING RESTRICTIONS: No  FALLS: Has patient fallen in last 6 months? No  LIVING ENVIRONMENT: Lives with: lives with their spouse Lives in: House/apartment Stairs: No Has following equipment at home: Tour manager, Grab bars, and trekking poles  PLOF: Requires assistive device for independence  PATIENT GOALS: "Balance and being able to walk better and pick up this L leg"   OBJECTIVE:   COGNITION: Overall cognitive status: Within functional limits for tasks assessed   SENSATION: Pt reports numbness/tingling in BLEs (mostly in feet), light touch mostly impacted    POSTURE: rounded shoulders and forward head   LOWER EXTREMITY MMT:  Tested in seated position   MMT Right Eval Left Eval  Hip flexion 4 3+   Hip extension    Hip abduction 4+ 4  Hip adduction 4+ 4  Hip internal rotation    Hip external rotation    Knee flexion 4 4-  Knee extension 4 4-  Ankle dorsiflexion 4 2+  Ankle plantarflexion    Ankle inversion    Ankle eversion    (Blank rows = not tested)  BED MOBILITY:  Independent per pt but difficulty swinging LLE into bed  TODAY'S TREATMENT:     Ther Act  [facility] PT Assessment - 05/04/22 1456       Balance   Balance Assessed Yes      Standardized Balance Assessment   Standardized Balance Assessment Mini-BESTest;Timed Up and Go Test      Mini-BESTest   Sit To Stand Normal: Comes to stand without use of hands and stabilizes independently.    Rise to Toes Moderate: Heels up, but not full range (smaller than when holding hands), OR noticeable instability for 3 s.    Stand on one leg (left) Moderate: < 20 s   1.58s   Stand on one leg (right) Moderate: < 20 s   2.94s   Stand on one leg - lowest score 1    Compensatory Stepping Correction - Forward Normal: Recovers independently with a single, large step (second realignement is allowed).    Compensatory Stepping Correction - Backward No step, OR would fall if not caught, OR falls spontaneously.    Compensatory Stepping Correction - Left Lateral Moderate: Several steps to recover equilibrium    Compensatory Stepping Correction - Right Lateral Severe:  Falls, or cannot step    Stepping Corredtion Lateral - lowest score 0    Stance - Feet together, eyes open, firm surface  Moderate: < 30s   Moderate lateral sway   Stance - Feet together, eyes closed, foam surface  Moderate: < 30s   22.87s, lateral LOB to R side   Incline - Eyes Closed Moderate: Stands independently < 30s OR aligns with surface   Significant R lateral lean   Change in Gait Speed Moderate: Unable to change walking speed or signs of imbalance  Minor LOB   Walk with head turns - Horizontal Moderate: performs head turns with reduction in gait speed.    Walk with  pivot turns Moderate:Turns with feet close SLOW (>4 steps) with good balance.    Step over obstacles Moderate: Steps over box but touches box OR displays cautious behavior by slowing gait.    Timed UP & GO with Dual Task Moderate: Dual Task affects either counting OR walking (>10%) when compared to the TUG without Dual Task.    Mini-BEST total score 14      Timed Up and Go Test   Normal TUG (seconds) 9.69    Cognitive TUG (seconds) 15.78   High fall risk   TUG Comments No AD            Ther Ex Added to HEP (see bolded below) to incorporate vestibular input. Had pt stand in corner on airex in Romberg stance w/vertical and horizontal head nods. Pt more challenged w/vertical head nods and required min A to stabilize due to LOB to R side. Educated pt on how to safely set this up at home (back in corner, sturdy chair in front of pt), pt verbalized understanding.     PATIENT EDUCATION: Education details: Updates to HEP Person educated: Patient Education method: Medical illustrator Education comprehension: verbalized understanding and needs further education  HOME EXERCISE PROGRAM: Access Code: [facility] URL: [email or web address] Date: 04/27/2022 Prepared by: Alethia Berthold Adama Ivins  Exercises - Side Step Down with Counter Support  - 1 x daily - 7 x weekly - 3 sets - 10 reps - Forward Step Down with Heel Tap and Rail Support  - 1 x daily - 7 x weekly - 3 sets - 10 reps - Side Stepping with Resistance at Thighs  - 1 x daily - 7 x weekly - 3 sets - 10 reps - Forward Monster Walks  - 1 x daily - 7 x weekly - 3 sets - 10 reps - Backward Monster Walks  - 1 x daily - 7 x weekly - 3 sets - 10 reps - Squat with dumbbells   - 1 x daily - 7 x weekly - 3 sets - 10 reps - Bird Dog  - 1 x daily - 7 x weekly - 3 sets - 10 reps - Child's Pose Stretch  - 1 x daily - 7 x weekly - 3 sets - 10 reps - Tall Kneel Vertical Bridge  - 1 x daily - 7 x weekly - 3 sets - 10 reps - Dead Bug  - 1  x daily - 7 x weekly - 3 sets - 10 reps - Standing Anti-Rotation Press with Anchored Resistance  - 1 x daily - 7 x weekly - 3 sets - 10 reps - Standing march with overhead hold   - 1 x daily - 7 x weekly - 3 sets - 10 reps - Standing in corner on yoga mat with feet together   - 1 x daily - 7 x weekly - 3-4 reps - 30-45 seconds hold  GOALS: Goals reviewed with patient? Yes  SHORT TERM GOALS: Target date: 05/09/2022   Pt will be independent with initial HEP for improved strength, balance, transfers and gait.  Baseline: provided handout of HEP from previous POC on eval  Goal status: INITIAL  2.  Pt will trial various braces on LLE to determine safest and least restrictive option to promote independence w/mobility.  Baseline:  Goal status: INITIAL  3.  Minibest to be  assessed and LTG updated  Baseline: 14/28 (on 4/25)  Goal status: MET  4.  Pt will ambulate greater than or equal to 1100 feet on with LRAD mod I for improved cardiovascular endurance and BLE strength.   Baseline: 998' w/o AD and SBA Goal status: REVISED    LONG TERM GOALS: Target date: 05/30/2022   Pt will be independent with final HEP for improved strength, balance, transfers and gait.  Baseline:  Goal status: INITIAL  2.  Pt will improve gait velocity to at least 3.5 ft/s with LRAD for improved gait efficiency and independence  Baseline: 3.36 ft/s with trekking poles  Goal status: INITIAL  3.  Pt will improve MiniBest to 20/28 for decreased fall risk and improvement with compensatory stepping strategies.   Baseline: 14/28 (4/25)  Goal status: REVISED  4.  Pt will ambulate greater than or equal to 1300 feet on with LRAD mod I for improved cardiovascular endurance and BLE strength.   Baseline: 998' w/o AD and SBA Goal status: REVISED   ASSESSMENT:  CLINICAL IMPRESSION: Emphasis of skilled PT session on assessing balance via MiniBest and adding vestibular exercises to HEP. Pt scored a 14/28  on MiniBest, indicative of high fall risk. Pt most challenged by single leg stability, reactive balance strategies and dynamic gait. Pt has single anterior LOB w/changes in gait speed, requiring min A to stabilize. Pt more challenged by vertical head nods vs horizontal and repeatedly loses balance to R side. Continue POC.    OBJECTIVE IMPAIRMENTS: Abnormal gait, decreased balance, decreased endurance, decreased mobility, difficulty walking, decreased strength, and impaired sensation  ACTIVITY LIMITATIONS: carrying, bending, squatting, stairs, transfers, bed mobility, locomotion level, and caring for others  PARTICIPATION LIMITATIONS: driving, shopping, community activity, occupation, and yard work  PERSONAL FACTORS: Fitness, Past/current experiences, and 1 comorbidity: RRMS  are also affecting patient's functional outcome.   REHAB POTENTIAL: Good  CLINICAL DECISION MAKING: Evolving/moderate complexity  EVALUATION COMPLEXITY: Moderate  PLAN:  PT FREQUENCY: 2x/week  PT DURATION: 6 weeks  PLANNED INTERVENTIONS: Therapeutic exercises, Therapeutic activity, Neuromuscular re-education, Balance training, Gait training, Patient/Family education, Self Care, Joint mobilization, Stair training, Vestibular training, Canalith repositioning, Orthotic/Fit training, Aquatic Therapy, Dry Needling, Electrical stimulation, Manual therapy, and Re-evaluation  PLAN FOR NEXT SESSION:  Trial  Bioness. Review and update HEP. Did Alethia Berthold get AFO order? Walking on unlevel surfaces, dual tasking, half kneel wood chops, staggered DL, resisted gait, blaze pods    Jill Alexanders June Vacha, PT, DPT 05/04/2022, 3:34 PM

## 2022-05-09 ENCOUNTER — Ambulatory Visit
Admission: RE | Admit: 2022-05-09 | Discharge: 2022-05-09 | Disposition: A | Discharge status: Home / Self Care | Payer: No Typology Code available for payment source | Source: Ambulatory Visit | Attending: Gastroenterology | Admitting: Gastroenterology

## 2022-05-09 DIAGNOSIS — Q438 Other specified congenital malformations of intestine: Secondary | ICD-10-CM

## 2022-05-09 DIAGNOSIS — Z800 Family history of malignant neoplasm of digestive organs: Secondary | ICD-10-CM

## 2022-05-11 ENCOUNTER — Ambulatory Visit: Payer: No Typology Code available for payment source | Attending: Neurology | Admitting: Physical Therapy

## 2022-05-11 DIAGNOSIS — M25512 Pain in left shoulder: Secondary | ICD-10-CM | POA: Insufficient documentation

## 2022-05-11 DIAGNOSIS — R2689 Other abnormalities of gait and mobility: Secondary | ICD-10-CM | POA: Diagnosis present

## 2022-05-11 DIAGNOSIS — G8929 Other chronic pain: Secondary | ICD-10-CM | POA: Insufficient documentation

## 2022-05-11 DIAGNOSIS — R29818 Other symptoms and signs involving the nervous system: Secondary | ICD-10-CM | POA: Diagnosis present

## 2022-05-11 DIAGNOSIS — R2681 Unsteadiness on feet: Secondary | ICD-10-CM | POA: Diagnosis present

## 2022-05-11 DIAGNOSIS — M6281 Muscle weakness (generalized): Secondary | ICD-10-CM | POA: Diagnosis present

## 2022-05-11 NOTE — Therapy (Signed)
OUTPATIENT PHYSICAL THERAPY NEURO TREATMENT   Patient Name: Samantha Church MRN: [id number] DOB:09-30-1960, 62 y.o., female Today's Date: 05/11/2022   PCP: Loyola Mast, MD REFERRING PROVIDER: Asa Lente, MD  END OF SESSION:  PT End of Session - 05/11/22 [phone]     Visit Number 6    Number of Visits 13   Plus eval   Date for PT Re-Evaluation 06/06/22    Authorization Type [employer]    PT Start Time 0935   Therapist running late   PT Stop Time 1016    PT Time Calculation (min) 41 min    Equipment Utilized During Treatment Gait belt    Activity Tolerance Patient tolerated treatment well    Behavior During Therapy WFL for tasks assessed/performed                  Past Medical History:  Diagnosis Date   Anemia    Hortons syndrome    Hypothyroidism    Dr. Leslie Dales   Lyme disease    Multiple sclerosis (HCC)    Neuromuscular disorder (HCC)    MS   Vision abnormalities    Past Surgical History:  Procedure Laterality Date   AUGMENTATION MAMMAPLASTY Bilateral 2003   saline   BREAST IMPLANT REMOVAL Bilateral 02/17/2019   BREAST SURGERY  2002   COLONOSCOPY  2008   DILATATION & CURRETTAGE/HYSTEROSCOPY WITH RESECTOCOPE N/A 10/09/2012   Procedure: DILATATION & CURETTAGE/HYSTEROSCOPY WITH RESECTOCOPE;  Surgeon: Zelphia Cairo, MD;  Location: [facility];  Service: Gynecology;  Laterality: N/A;  RESECTION with Versapoint    KNEE ARTHROSCOPY  1996   UTERINE FIBROID SURGERY  2019   fibroidectomy   Patient Active Problem List   Diagnosis Date Noted   SK (seborrheic keratosis) 01/25/2022   Stress incontinence, female 01/25/2022   Tinnitus of both ears 10/11/2021   Presbycusis of both ears 10/11/2021   Diplopia 04/20/2021   Balance problem 04/20/2021   Osteopenia 03/31/2021   Borderline hyperlipidemia 01/17/2021   History of Lyme disease 06/22/2020   High risk medication use 07/25/2018   Left leg weakness 10/14/2015   Hypothyroidism (acquired) 11/03/2014    Other fatigue 11/03/2014   Gait disorder 11/03/2014   Multiple sclerosis (HCC) 09/01/2008    ONSET DATE: 04/06/2022  REFERRING DIAG: G35 (ICD-10-CM) - Multiple sclerosis  THERAPY DIAG:  Unsteadiness on feet  Other abnormalities of gait and mobility  Muscle weakness (generalized)  Rationale for Evaluation and Treatment: Rehabilitation  SUBJECTIVE:  SUBJECTIVE STATEMENT: Pt reports doing well, is more fatigued today due to heat. No falls.    Pt accompanied by: self  PERTINENT HISTORY: MRI of the brain 05/04/2021 showed T2/FLAIR hyperintense foci in the brainstem, cerebellum and hemispheres in a pattern consistent with chronic demyelinating plaque associated with multiple sclerosis. None of the foci enhance. However, one focus in the posterior pons on the right is new compared to the 2021 MRI and show subtle restricted diffusion. It is in a location that could cause diplopia   PAIN:  Are you having pain? No  PRECAUTIONS: Fall  WEIGHT BEARING RESTRICTIONS: No  FALLS: Has patient fallen in last 6 months? No  LIVING ENVIRONMENT: Lives with: lives with their spouse Lives in: House/apartment Stairs: No Has following equipment at home: Tour manager, Grab bars, and trekking poles  PLOF: Requires assistive device for independence  PATIENT GOALS: "Balance and being able to walk better and pick up this L leg"   OBJECTIVE:   COGNITION: Overall cognitive status: Within functional limits for tasks assessed   SENSATION: Pt reports numbness/tingling in BLEs (mostly in feet), light touch mostly impacted    POSTURE: rounded shoulders and forward head   LOWER EXTREMITY MMT:  Tested in seated position   MMT Right Eval Left Eval  Hip flexion 4 3+  Hip extension    Hip abduction 4+ 4  Hip  adduction 4+ 4  Hip internal rotation    Hip external rotation    Knee flexion 4 4-  Knee extension 4 4-  Ankle dorsiflexion 4 2+  Ankle plantarflexion    Ankle inversion    Ankle eversion    (Blank rows = not tested)  BED MOBILITY:  Independent per pt but difficulty swinging LLE into bed  TODAY'S TREATMENT:     Ther Act    Gait pattern: step through pattern, decreased stride length, decreased hip/knee flexion- Left, circumduction- Left, trunk flexed, and wide BOS Distance walked: 115' loop completed 9 laps + 63' = 1,076'  Assistive device utilized:  L SpryStep plus AFO Level of assistance: SBA Comments: Noted decreased circumduction of LLE w/use of AFO. Pt did improve distance w/use of brace vs no brace.    Ther Ex In // bars for improved BLE strength, single leg stability and reactive balance strategies. AFO worn throughout session: Alt step ups to 6" step w/o UE support, x8 per side. Increased difficulty stepping w/LLE but not LOB noted. SBA throughout  Added  holding 10# KB in contralateral hand of leg stepping up, x6 on LLE and x10 on RLE. Pt very challenged by this but performed well.   Alt step up w/contralateral march on blue side of bosu, x10 reps per side w/intermittent UE support. SBA for safety  Mini squats on black side of bosu, x4 reps. Tactile cues provided to pelvis to facilitate shift to R side to maintain balance.  Fwd gait over 115' w/random posterolateral perturbations using blue resistance band for improved reactive balance strategies. Pt performed well w/no LOB. Progressed to retro gait w/ facilitation of switch to anterior gait when a random posterior perturbation is applied. Pt required 3-4 large posterior steps to regain balance prior to facilitating anterior gait, but no major LOB noted. CGA throughout.   PATIENT EDUCATION: Education details: Continue HEP, goal outcomes  Person educated: Patient Education method: Software engineer Education comprehension: verbalized understanding and needs further education  HOME EXERCISE PROGRAM: Access Code: [facility] URL: [email or web address] Date: 04/27/2022 Prepared by: Alethia Berthold Jennene Downie  Exercises - Side Step Down with Counter Support  - 1 x daily - 7 x weekly - 3 sets - 10 reps - Forward Step Down with Heel Tap and Rail Support  - 1 x daily - 7 x weekly - 3 sets - 10 reps - Side Stepping with Resistance at Thighs  - 1 x daily - 7 x weekly - 3 sets - 10 reps - Forward Monster Walks  - 1 x daily - 7 x weekly - 3 sets - 10 reps - Backward Monster Walks  - 1 x daily - 7 x weekly - 3 sets - 10 reps - Squat with dumbbells   - 1 x daily - 7 x weekly - 3 sets - 10 reps - Bird Dog  - 1 x daily - 7 x weekly - 3 sets - 10 reps - Child's Pose Stretch  - 1 x daily - 7 x weekly - 3 sets - 10 reps - Tall Kneel Vertical Bridge  - 1 x daily - 7 x weekly - 3 sets - 10 reps - Dead Bug  - 1 x daily - 7 x weekly - 3 sets - 10 reps - Standing Anti-Rotation Press with Anchored Resistance  - 1 x daily - 7 x weekly - 3 sets - 10 reps - Standing march with overhead hold   - 1 x daily - 7 x weekly - 3 sets - 10 reps - Standing in corner on yoga mat with feet together   - 1 x daily - 7 x weekly - 3-4 reps - 30-45 seconds hold  GOALS: Goals reviewed with patient? Yes  SHORT TERM GOALS: Target date: 05/09/2022   Pt will be independent with initial HEP for improved strength, balance, transfers and gait.  Baseline: provided handout of HEP from previous POC on eval  Goal status: MET  2.  Pt will trial various braces on LLE to determine safest and least restrictive option to promote independence w/mobility.  Baseline:  Goal status: MET  3.  Minibest to be assessed and LTG updated  Baseline: 14/28 (on 4/25)  Goal status: MET  4.  Pt will ambulate greater than or equal to 1100 feet on with LRAD mod I for improved cardiovascular endurance and BLE strength.    Baseline: 998' w/o AD and SBA; 1,076' w/SpryStep plus AFO and SBA  Goal status: IN PROGRESS    LONG TERM GOALS: Target date: 05/30/2022   Pt will be independent with final HEP for improved strength, balance, transfers and gait.  Baseline:  Goal status: INITIAL  2.  Pt will improve gait velocity to at least 3.5 ft/s with LRAD for improved gait efficiency and independence  Baseline: 3.36 ft/s with trekking poles  Goal status: INITIAL  3.  Pt will improve MiniBest to 20/28 for decreased fall risk and improvement with compensatory stepping strategies.   Baseline: 14/28 (4/25)  Goal status: REVISED  4.  Pt will ambulate greater than or equal to 1300 feet on with LRAD mod I for improved cardiovascular endurance and BLE strength.   Baseline: 998' w/o AD and SBA Goal status: REVISED   ASSESSMENT:  CLINICAL IMPRESSION: Emphasis of skilled PT session on STG assessment, gait w/AFO and BLE strength. Pt has met 3 of 4 STGs, trialing various braces in clinic and performing his HEP regularly. Pt did improve her distance walked on this date w/use of [place] SpryStep Plus AFO, but did not meet her goal level. Pt  much more stable w/gait and dynamic balance challenges w/use of AFO and is to see [facility] next week. Continue POC.    OBJECTIVE IMPAIRMENTS: Abnormal gait, decreased balance, decreased endurance, decreased mobility, difficulty walking, decreased strength, and impaired sensation  ACTIVITY LIMITATIONS: carrying, bending, squatting, stairs, transfers, bed mobility, locomotion level, and caring for others  PARTICIPATION LIMITATIONS: driving, shopping, community activity, occupation, and yard work  PERSONAL FACTORS: Fitness, Past/current experiences, and 1 comorbidity: RRMS  are also affecting patient's functional outcome.   REHAB POTENTIAL: Good  CLINICAL DECISION MAKING: Evolving/moderate complexity  EVALUATION COMPLEXITY: Moderate  PLAN:  PT FREQUENCY:  2x/week  PT DURATION: 6 weeks  PLANNED INTERVENTIONS: Therapeutic exercises, Therapeutic activity, Neuromuscular re-education, Balance training, Gait training, Patient/Family education, Self Care, Joint mobilization, Stair training, Vestibular training, Canalith repositioning, Orthotic/Fit training, Aquatic Therapy, Dry Needling, Electrical stimulation, Manual therapy, and Re-evaluation  PLAN FOR NEXT SESSION:  Trial  Bioness. Review and update HEP. Walking on unlevel surfaces, dual tasking, half kneel wood chops, staggered DL, resisted gait, blaze pods    Sherrick Araki E Jonae Renshaw, PT, DPT 05/11/2022, 10:17 AM

## 2022-05-16 ENCOUNTER — Ambulatory Visit: Payer: No Typology Code available for payment source | Admitting: Physical Therapy

## 2022-05-18 ENCOUNTER — Ambulatory Visit: Payer: No Typology Code available for payment source | Admitting: Physical Therapy

## 2022-05-18 DIAGNOSIS — R2681 Unsteadiness on feet: Secondary | ICD-10-CM

## 2022-05-18 DIAGNOSIS — R2689 Other abnormalities of gait and mobility: Secondary | ICD-10-CM

## 2022-05-18 DIAGNOSIS — M6281 Muscle weakness (generalized): Secondary | ICD-10-CM

## 2022-05-18 NOTE — Therapy (Addendum)
OUTPATIENT PHYSICAL THERAPY NEURO TREATMENT   Patient Name: Samantha Church MRN: [id number] DOB:08/12/1960, 62 y.o., female Today's Date: 05/18/2022   PCP: Loyola Mast, MD REFERRING PROVIDER: Asa Lente, MD  END OF SESSION:  PT End of Session - 05/18/22 0943     Visit Number 7    Number of Visits 13   Plus eval   Date for PT Re-Evaluation 06/06/22    Authorization Type [employer]    PT Start Time 0940   Therapist running late   PT Stop Time 1021    PT Time Calculation (min) 41 min    Equipment Utilized During Treatment Gait belt    Activity Tolerance Patient tolerated treatment well    Behavior During Therapy WFL for tasks assessed/performed                  Past Medical History:  Diagnosis Date   Anemia    Hortons syndrome    Hypothyroidism    Dr. Leslie Dales   Lyme disease    Multiple sclerosis (HCC)    Neuromuscular disorder (HCC)    MS   Vision abnormalities    Past Surgical History:  Procedure Laterality Date   AUGMENTATION MAMMAPLASTY Bilateral 2003   saline   BREAST IMPLANT REMOVAL Bilateral 02/17/2019   BREAST SURGERY  2002   COLONOSCOPY  2008   DILATATION & CURRETTAGE/HYSTEROSCOPY WITH RESECTOCOPE N/A 10/09/2012   Procedure: DILATATION & CURETTAGE/HYSTEROSCOPY WITH RESECTOCOPE;  Surgeon: Zelphia Cairo, MD;  Location: [facility];  Service: Gynecology;  Laterality: N/A;  RESECTION with Versapoint    KNEE ARTHROSCOPY  1996   UTERINE FIBROID SURGERY  2019   fibroidectomy   Patient Active Problem List   Diagnosis Date Noted   SK (seborrheic keratosis) 01/25/2022   Stress incontinence, female 01/25/2022   Tinnitus of both ears 10/11/2021   Presbycusis of both ears 10/11/2021   Diplopia 04/20/2021   Balance problem 04/20/2021   Osteopenia 03/31/2021   Borderline hyperlipidemia 01/17/2021   History of Lyme disease 06/22/2020   High risk medication use 07/25/2018   Left leg weakness 10/14/2015   Hypothyroidism (acquired) 11/03/2014    Other fatigue 11/03/2014   Gait disorder 11/03/2014   Multiple sclerosis (HCC) 09/01/2008    ONSET DATE: 04/06/2022  REFERRING DIAG: G35 (ICD-10-CM) - Multiple sclerosis  THERAPY DIAG:  Unsteadiness on feet  Other abnormalities of gait and mobility  Muscle weakness (generalized)  Rationale for Evaluation and Treatment: Rehabilitation  SUBJECTIVE:  SUBJECTIVE STATEMENT: Pt reports doing well, her L upper trap is bothering her. Thinks she pulled it doing her HEP. No falls    Pt accompanied by: self  PERTINENT HISTORY: MRI of the brain 05/04/2021 showed T2/FLAIR hyperintense foci in the brainstem, cerebellum and hemispheres in a pattern consistent with chronic demyelinating plaque associated with multiple sclerosis. None of the foci enhance. However, one focus in the posterior pons on the right is new compared to the 2021 MRI and show subtle restricted diffusion. It is in a location that could cause diplopia   PAIN:  Are you having pain? Yes: NPRS scale: 6/10 Pain location: R upper trap Pain description: Achy/throb  PRECAUTIONS: Fall  WEIGHT BEARING RESTRICTIONS: No  FALLS: Has patient fallen in last 6 months? No  LIVING ENVIRONMENT: Lives with: lives with their spouse Lives in: House/apartment Stairs: No Has following equipment at home: Tour manager, Grab bars, and trekking poles  PLOF: Requires assistive device for independence  PATIENT GOALS: "Balance and being able to walk better and pick up this L leg"   OBJECTIVE:   COGNITION: Overall cognitive status: Within functional limits for tasks assessed   SENSATION: Pt reports numbness/tingling in BLEs (mostly in feet), light touch mostly impacted    POSTURE: rounded shoulders and forward head   LOWER EXTREMITY MMT:  Tested in  seated position   MMT Right Eval Left Eval  Hip flexion 4 3+  Hip extension    Hip abduction 4+ 4  Hip adduction 4+ 4  Hip internal rotation    Hip external rotation    Knee flexion 4 4-  Knee extension 4 4-  Ankle dorsiflexion 4 2+  Ankle plantarflexion    Ankle inversion    Ankle eversion    (Blank rows = not tested)  BED MOBILITY:  Independent per pt but difficulty swinging LLE into bed  TODAY'S TREATMENT:     Manual Therapy  The following were performed for improved pain modulation, functional use of LUE and posture:  Seated upper trap stretch on L side, x2 minutes. Therapist provided manual block to L shoulder to avoid shoulder shrug compensation.  Deep tissue massage to L upper trap, x5 minutes.   Ther Act  Trigger Point Dry-Needling  Treatment instructions: Expect mild to moderate muscle soreness. S/S of pneumothorax if dry needled over a lung field, and to seek immediate medical attention should they occur. Patient verbalized understanding of these instructions and education.  Patient Consent Given: Yes Education handout provided: Yes Muscles treated: L upper trap Treatment response/outcome: deep ache/muscle cramp; muscle twitch detected   NMR  Using blue floor mat w/4 gumdrops randomly placed underneath mat to imitate grassy unlevel surface for improved anticipatory balance strategies, BLE strength, LE coordination and single leg stability:  Using 5 blaze pods on random reach setting for 2 minute rounds, CGA-min A throughout without UE support Round 1: pods placed on front of mat to work on side stepping, 20 hits. Increased instability stepping to R side > L side but no LOB noted.  Round 2: placing pods on both sides of mat and working on fwd/retro stepping, 13 hits. Pt required increased level of min A w/retro gait and frequently reached for therapist's desk to stabilize. No major LOB noted    PATIENT EDUCATION: Education details: Continue HEP, info on TPDN,  updated photo of pallof presses (as pt misinterpreted original Tax inspector) Person educated: Patient Education method: Explanation, Demonstration, and Handouts Education comprehension: verbalized understanding and needs further education  HOME EXERCISE PROGRAM: Access Code: [facility] URL: [email or web address] Date: 04/27/2022 Prepared by: Alethia Berthold Rhen Kawecki  Exercises - Side Step Down with Counter Support  - 1 x daily - 7 x weekly - 3 sets - 10 reps - Forward Step Down with Heel Tap and Rail Support  - 1 x daily - 7 x weekly - 3 sets - 10 reps - Side Stepping with Resistance at Thighs  - 1 x daily - 7 x weekly - 3 sets - 10 reps - Forward Monster Walks  - 1 x daily - 7 x weekly - 3 sets - 10 reps - Backward Monster Walks  - 1 x daily - 7 x weekly - 3 sets - 10 reps - Squat with dumbbells   - 1 x daily - 7 x weekly - 3 sets - 10 reps - Bird Dog  - 1 x daily - 7 x weekly - 3 sets - 10 reps - Child's Pose Stretch  - 1 x daily - 7 x weekly - 3 sets - 10 reps - Tall Kneel Vertical Bridge  - 1 x daily - 7 x weekly - 3 sets - 10 reps - Dead Bug  - 1 x daily - 7 x weekly - 3 sets - 10 reps - Standing Anti-Rotation Press with Anchored Resistance  - 1 x daily - 7 x weekly - 3 sets - 10 reps - Standing march with overhead hold   - 1 x daily - 7 x weekly - 3 sets - 10 reps - Standing in corner on yoga mat with feet together   - 1 x daily - 7 x weekly - 3-4 reps - 30-45 seconds hold  GOALS: Goals reviewed with patient? Yes  SHORT TERM GOALS: Target date: 05/09/2022   Pt will be independent with initial HEP for improved strength, balance, transfers and gait.  Baseline: provided handout of HEP from previous POC on eval  Goal status: MET  2.  Pt will trial various braces on LLE to determine safest and least restrictive option to promote independence w/mobility.  Baseline:  Goal status: MET  3.  Minibest to be assessed and LTG updated  Baseline: 14/28 (on 4/25)  Goal status:  MET  4.  Pt will ambulate greater than or equal to 1100 feet on with LRAD mod I for improved cardiovascular endurance and BLE strength.   Baseline: 998' w/o AD and SBA; 1,076' w/SpryStep plus AFO and SBA  Goal status: IN PROGRESS    LONG TERM GOALS: Target date: 05/30/2022   Pt will be independent with final HEP for improved strength, balance, transfers and gait.  Baseline:  Goal status: INITIAL  2.  Pt will improve gait velocity to at least 3.5 ft/s with LRAD for improved gait efficiency and independence  Baseline: 3.36 ft/s with trekking poles  Goal status: INITIAL  3.  Pt will improve MiniBest to 20/28 for decreased fall risk and improvement with compensatory stepping strategies.   Baseline: 14/28 (4/25)  Goal status: REVISED  4.  Pt will ambulate greater than or equal to 1300 feet on with LRAD mod I for improved cardiovascular endurance and BLE strength.   Baseline: 998' w/o AD and SBA Goal status: REVISED   ASSESSMENT:  CLINICAL IMPRESSION: Emphasis of skilled PT session on manual therapy to L upper trap, TPDN and anticipatory balance strategies. Pt reports she strained her L trap while performing pallof presses incorrectly, so spent majority of session addressing pain as this is preventing  pt from sleeping. Trialed TPDN this date, pt to report effectiveness next session. Pt with decreased palpable tightness to L UT following TPDN treatment and can benefit from DN in future sessions if agreeable. Pt continues to have difficulty stabilizing on unlevel surfaces due to poor ankle stability (R>L) and impaired sensation but was able to perform LE coordination task well w/min A only on unlevel surface. Continue POC.    OBJECTIVE IMPAIRMENTS: Abnormal gait, decreased balance, decreased endurance, decreased mobility, difficulty walking, decreased strength, and impaired sensation  ACTIVITY LIMITATIONS: carrying, bending, squatting, stairs, transfers, bed mobility,  locomotion level, and caring for others  PARTICIPATION LIMITATIONS: driving, shopping, community activity, occupation, and yard work  PERSONAL FACTORS: Fitness, Past/current experiences, and 1 comorbidity: RRMS  are also affecting patient's functional outcome.   REHAB POTENTIAL: Good  CLINICAL DECISION MAKING: Evolving/moderate complexity  EVALUATION COMPLEXITY: Moderate  PLAN:  PT FREQUENCY: 2x/week  PT DURATION: 6 weeks  PLANNED INTERVENTIONS: Therapeutic exercises, Therapeutic activity, Neuromuscular re-education, Balance training, Gait training, Patient/Family education, Self Care, Joint mobilization, Stair training, Vestibular training, Canalith repositioning, Orthotic/Fit training, Aquatic Therapy, Dry Needling, Electrical stimulation, Manual therapy, and Re-evaluation  PLAN FOR NEXT SESSION:  Trial  Bioness. Review and update HEP. Walking on unlevel surfaces, dual tasking, half kneel wood chops, staggered DL, resisted gait, blaze pods    Jill Alexanders Dezaree Tracey, PT, DPT Peter Congo, PT, DPT, CSRS  05/18/2022, 11:42 AM

## 2022-05-19 ENCOUNTER — Ambulatory Visit: Payer: No Typology Code available for payment source | Admitting: Internal Medicine

## 2022-05-19 ENCOUNTER — Encounter: Payer: Self-pay | Admitting: Internal Medicine

## 2022-05-19 VITALS — BP 118/76 | HR 68 | Resp 16 | Ht 66.0 in | Wt 154.0 lb

## 2022-05-19 DIAGNOSIS — E039 Hypothyroidism, unspecified: Secondary | ICD-10-CM

## 2022-05-19 MED ORDER — NP THYROID 60 MG PO TABS: ORAL_TABLET | ORAL | 3 refills | Status: DC

## 2022-05-19 NOTE — Patient Instructions (Signed)
Please continue NP thyroid 60 mg daily. ? ?Take the thyroid hormone every day, with water, at least 30 minutes before breakfast, separated by at least 4 hours from: ?- acid reflux medications ?- calcium ?- iron ?- multivitamins ? ?Please come back for a follow-up appointment in 1 year. ? ?

## 2022-05-19 NOTE — Progress Notes (Signed)
Patient ID: Samantha Church, female   DOB: [birth year], 62 y.o.   MRN: [id number]   HPI  Samantha Church is a 62 y.o.-year-old female, presenting for follow-up for hypothyroidism.  Last visit 1 year ago.  Interim history: She denies weakness, falls.  No weight gain/loss. At last visit, she had double vision and was getting steroids.  This resolved. She recently stopped the high-dose biotin completely.  Reviewed history: Pt. has been dx with hypothyroidism in 2013 >> she was initially on Synthroid, then changed to NP thyroid at [facility] ([facility]).    She takes NP thyroid 60 mg daily: - in am - fasting - + Coffee (black) at least 30 minutes later - at least 30 min from b'fast - no Ca, Fe - + MVI -moved from 1 hour to 4 hours after thyroid hormone - no PPIs - on Biotin very high dose (100,000 mcg 1-2x daily >> then 1x a day >> now off x 3 weeks) - for MS  On vitamin C, D, Mg, , Metamucil, ALA (also for MS), , . On Ocrevus (started 02/2021) - every 6 months.   Reviewed her TFTs: Lab Results  Component Value Date   TSH 0.82 01/25/2022   TSH 1.00 04/18/2021   TSH 1.35 04/15/2020   TSH 0.98 04/16/2019   TSH 0.76 12/03/2018   TSH 0.77 07/29/2018   TSH 0.70 05/07/2017   TSH 0.75 11/07/2016   TSH 0.88 06/27/2016   TSH 1.150 05/14/2015   FREET4 0.79 04/15/2020   FREET4 5.19 (H) 04/16/2019   FREET4 1.39 12/03/2018   FREET4 1.1 07/29/2018   FREET4 0.78 05/07/2017   FREET4 0.85 11/07/2016   Lab Results  Component Value Date   T3FREE 3.2 04/15/2020   T3FREE 18.2 (H) 04/16/2019   T3FREE 4.1 07/29/2018   T3FREE 4.3 (H) 05/07/2017   T3FREE 4.1 11/07/2016   Investigation for Hashimoto's thyroiditis was negative: Component     Latest Ref Rng & Units 11/07/2016  Thyroglobulin Ab     < or = 1 IU/mL <1  Thyroperoxidase Ab SerPl-aCnc     <9 IU/mL <1   Pt denies: - feeling nodules in neck - hoarseness - dysphagia - choking  She has increased fatigue - on B12  supplements as she was told that Ocrevus can decrease her vitamin B12 level.  Thyroid U.S (05/07/2007) report reviewed: Normal  No family history of hypo or hyperthyroidism but + FH of thyroid cancer in brother and sister: PTC. No h/o radiation tx to head or neck. No new herbal supplements. + steroids use - this was started this weekend.   She got her breast implants removed 02/2019.  She was seeing [facility] for Lyme ds.  Of note, liver tests became elevated>> stopped the ABx for Lyme ds.  LFTs normalized: Lab Results  Component Value Date   ALT 17 04/20/2021   AST 19 04/20/2021   ALKPHOS 65 04/20/2021   BILITOT <0.2 04/20/2021   ROS:+ see HPI  I reviewed pt's medications, allergies, PMH, social hx, family hx, and changes were documented in the history of present illness. Otherwise, unchanged from my initial visit note.  Past Medical History:  Diagnosis Date   Anemia    Hortons syndrome    Hypothyroidism    Dr. Leslie Dales   Lyme disease    Multiple sclerosis (HCC)    Neuromuscular disorder (HCC)    MS   Vision abnormalities    Past Surgical History:  Procedure  Laterality Date   AUGMENTATION MAMMAPLASTY Bilateral 2003   saline   BREAST IMPLANT REMOVAL Bilateral 02/17/2019   BREAST SURGERY  2002   COLONOSCOPY  2008   DILATATION & CURRETTAGE/HYSTEROSCOPY WITH RESECTOCOPE N/A 10/09/2012   Procedure: DILATATION & CURETTAGE/HYSTEROSCOPY WITH RESECTOCOPE;  Surgeon: Zelphia Cairo, MD;  Location: [facility];  Service: Gynecology;  Laterality: N/A;  RESECTION with Versapoint    KNEE ARTHROSCOPY  1996   UTERINE FIBROID SURGERY  2019   fibroidectomy   Social History   Social History   Marital status: Married    Spouse name: N/A   Number of children: 4   Occupational History   Accounting   Social History Main Topics   Smoking status: Never Smoker   Smokeless tobacco: Never Used   Alcohol use Yes     Comment: wine, nightly   Drug use: No    Current Outpatient Medications on File Prior to Visit  Medication Sig Dispense Refill   Alpha-Lipoic Acid 600 MG TABS      Ascorbic Acid 500 MG/15ML SYRP Take by mouth.     Biotin 100 MG/GM POWD      Cholecalciferol (VITAMIN D-3) 5000 units TABS Take 1 tablet by mouth daily.     Cyanocobalamin (B-12) 3000 MCG LOZG      Magnesium (OPTIMAG 125) 125 MG CAPS Take 2 capsules by mouth daily.     Metamucil Fiber CHEW Chew 1 each by mouth daily.     methylphenidate (RITALIN) 5 MG tablet Take 1 tablet (5 mg total) by mouth 2 (two) times daily. 60 tablet 0   Multiple Vitamins-Minerals (MULTIVITAMIN ADULT PO) Take by mouth.     NP THYROID 60 MG tablet TAKE 1 TABLET BY MOUTH DAILY BEFORE BREAKFAST 30 tablet 0   ocrelizumab (OCREVUS) 300 MG/10ML injection Inject 20 mLs (600 mg total) into the vein every 6 (six) months. 20 mL 1   No current facility-administered medications on file prior to visit.   No Known Allergies Family History  Problem Relation Age of Onset   Alzheimer's disease Mother    Stroke Mother    Hypertension Mother    Arthritis Mother    Polycythemia Mother    Colon cancer Father    Thyroid cancer Sister    Dementia Brother        Early   Asthma Brother    Thyroid cancer Brother    Cancer Paternal Aunt        Brain   Cancer Paternal Uncle        Brain   Heart attack Maternal Grandmother    Hypertension Maternal Grandmother    CAD Maternal Grandmother    Diabetes Maternal Grandfather    Lung cancer Maternal Grandfather    Alzheimer's disease Paternal Grandmother    Hypertension Paternal Grandmother    Hyperlipidemia Paternal Grandmother    Diabetes Paternal Grandfather    Also, DM in PGF; HL, heart ds in M.  PE: LMP 02/10/2015  Wt Readings from Last 3 Encounters:  04/06/22 154 lb 8 oz (70.1 kg)  01/25/22 156 lb (70.8 kg)  10/05/21 153 lb 6.4 oz (69.6 kg)   Constitutional: normal weight, in NAD, walks with 2 canes Eyes:  EOMI, no exophthalmos ENT: no neck  masses, no cervical lymphadenopathy Cardiovascular: RRR, No MRG Respiratory: CTA B Musculoskeletal: no deformities Skin:no rashes Neurological: no tremor with outstretched hands  ASSESSMENT: 1. Acquired Hypothyroidism  PLAN:  1. Patient with longstanding, known autoimmune hypothyroidism, on desiccated thyroid extract -  latest thyroid labs reviewed with pt. >> normal: Lab Results  Component Value Date   TSH 0.82 01/25/2022  - she continues on NP thyroid 60 mg daily - pt feels good on this dose.  She does have fatigue, which she attributes to taking crepitus.  She is on B12 supplement. - we discussed about taking the thyroid hormone every day, with water, >30 minutes before breakfast, separated by >4 hours from acid reflux medications, calcium, iron, multivitamins. Pt. is taking it correctly. - Of note, she was on 100,000 units of biotin for MS. She was usually stopping the supplement 4 weeks prior to her thyroid labs.  Before last visit she had an MS exacerbation with double vision and we discussed about checking with neurology to see if holding her biotin could have caused the symptoms.  The neurologist did not think that her exacerbation was associated with skipping biotin.  In fact, she is now coming off biotin, stopped 3 weeks ago.  She does not plan to restart it. - Since her thyroid tests were normal 4 months ago, we do not absolutely need to repeat them now - I refilled her NP thyroid today - I will see her back in a year  Carlus Pavlov, MD PhD [facility]

## 2022-05-23 ENCOUNTER — Ambulatory Visit: Payer: No Typology Code available for payment source | Admitting: Physical Therapy

## 2022-05-23 DIAGNOSIS — M6281 Muscle weakness (generalized): Secondary | ICD-10-CM

## 2022-05-23 DIAGNOSIS — R2681 Unsteadiness on feet: Secondary | ICD-10-CM | POA: Diagnosis not present

## 2022-05-23 DIAGNOSIS — R2689 Other abnormalities of gait and mobility: Secondary | ICD-10-CM

## 2022-05-23 NOTE — Therapy (Signed)
OUTPATIENT PHYSICAL THERAPY NEURO TREATMENT   Patient Name: Samantha Church MRN: [id number] DOB:18-Aug-1960, 62 y.o., female Today's Date: 05/23/2022   PCP: Loyola Mast, MD REFERRING PROVIDER: Asa Lente, MD  END OF SESSION:  PT End of Session - 05/23/22 0850     Visit Number 8    Number of Visits 13   Plus eval   Date for PT Re-Evaluation 06/06/22    Authorization Type [employer]    PT Start Time 0848    PT Stop Time 0933    PT Time Calculation (min) 45 min    Equipment Utilized During Treatment --    Activity Tolerance Patient tolerated treatment well    Behavior During Therapy [facility] for tasks assessed/performed                   Past Medical History:  Diagnosis Date   Anemia    Hortons syndrome    Hypothyroidism    Dr. Leslie Dales   Lyme disease    Multiple sclerosis (HCC)    Neuromuscular disorder (HCC)    MS   Vision abnormalities    Past Surgical History:  Procedure Laterality Date   AUGMENTATION MAMMAPLASTY Bilateral 2003   saline   BREAST IMPLANT REMOVAL Bilateral 02/17/2019   BREAST SURGERY  2002   COLONOSCOPY  2008   DILATATION & CURRETTAGE/HYSTEROSCOPY WITH RESECTOCOPE N/A 10/09/2012   Procedure: DILATATION & CURETTAGE/HYSTEROSCOPY WITH RESECTOCOPE;  Surgeon: Zelphia Cairo, MD;  Location: [facility];  Service: Gynecology;  Laterality: N/A;  RESECTION with Versapoint    KNEE ARTHROSCOPY  1996   UTERINE FIBROID SURGERY  2019   fibroidectomy   Patient Active Problem List   Diagnosis Date Noted   SK (seborrheic keratosis) 01/25/2022   Stress incontinence, female 01/25/2022   Tinnitus of both ears 10/11/2021   Presbycusis of both ears 10/11/2021   Diplopia 04/20/2021   Balance problem 04/20/2021   Osteopenia 03/31/2021   Borderline hyperlipidemia 01/17/2021   History of Lyme disease 06/22/2020   High risk medication use 07/25/2018   Left leg weakness 10/14/2015   Hypothyroidism (acquired) 11/03/2014   Other fatigue 11/03/2014    Gait disorder 11/03/2014   Multiple sclerosis (HCC) 09/01/2008    ONSET DATE: 04/06/2022  REFERRING DIAG: G35 (ICD-10-CM) - Multiple sclerosis  THERAPY DIAG:  Unsteadiness on feet  Other abnormalities of gait and mobility  Muscle weakness (generalized)  Rationale for Evaluation and Treatment: Rehabilitation  SUBJECTIVE:  SUBJECTIVE STATEMENT: Pt reports she did not feel a major change with the TPDN, did not make it worse but did not make it better. Is willing to try again. Pain is at a 3/10 today.    Pt accompanied by: self  PERTINENT HISTORY: MRI of the brain 05/04/2021 showed T2/FLAIR hyperintense foci in the brainstem, cerebellum and hemispheres in a pattern consistent with chronic demyelinating plaque associated with multiple sclerosis. None of the foci enhance. However, one focus in the posterior pons on the right is new compared to the 2021 MRI and show subtle restricted diffusion. It is in a location that could cause diplopia   PAIN:  Are you having pain? Yes: NPRS scale: 3/10 Pain location: R upper trap Pain description: Achy/throb  PRECAUTIONS: Fall  WEIGHT BEARING RESTRICTIONS: No  FALLS: Has patient fallen in last 6 months? No  LIVING ENVIRONMENT: Lives with: lives with their spouse Lives in: House/apartment Stairs: No Has following equipment at home: Tour manager, Grab bars, and trekking poles  PLOF: Requires assistive device for independence  PATIENT GOALS: "Balance and being able to walk better and pick up this L leg"   OBJECTIVE:   COGNITION: Overall cognitive status: Within functional limits for tasks assessed   SENSATION: Pt reports numbness/tingling in BLEs (mostly in feet), light touch mostly impacted    POSTURE: rounded shoulders and forward head   LOWER  EXTREMITY MMT:  Tested in seated position   MMT Right Eval Left Eval  Hip flexion 4 3+  Hip extension    Hip abduction 4+ 4  Hip adduction 4+ 4  Hip internal rotation    Hip external rotation    Knee flexion 4 4-  Knee extension 4 4-  Ankle dorsiflexion 4 2+  Ankle plantarflexion    Ankle inversion    Ankle eversion    (Blank rows = not tested)  BED MOBILITY:  Independent per pt but difficulty swinging LLE into bed  TODAY'S TREATMENT:     Ther Act  Assessed L shoulder ROM and no major limitations noted to indicate adhesive capsulitis. Pt's MMT also WNL. At this time, suspect impingement of shoulder or biceps tendonitis as cause of shoulder pain as pt TTP along long head of biceps insertion and has increased pain w/shoulder flexion near end range.   Ther Ex  The following exercises were performed for improved shoulder ROM, pain modulation and functional mobility of BUEs: Unilateral shoulder shrugs w/12# KB, x10 per side. Pt performed well on R side but relied on lateral lean compensation on L side due to LUE weakness.  PVC passthroughs x12 reps. Pt unable to perform full ROM due to decreased mobility of LUE. Added to HEP (see bolded below) and provided printout on word doc as this exercise not on [employer]  PVC around the worlds, x8 per side. Increased difficulty performing when rotating to L side but pt performed well overall. Added to HEP (see bolded below) and provided printout on word doc as this exercises not on [employer]   PVC tricep stretch, x60s hold per side. No major limitations noted so did not add to HEP  PVC shoulder ER stretch, x60s per side. No major limitations noted so did not add to HEP Pt reported 2/10 L shoulder pain following session    PATIENT EDUCATION: Education details: Updates to HEP  Person educated: Patient Education method: Explanation, Demonstration, and Handouts Education comprehension: verbalized understanding and returned demonstration  HOME  EXERCISE PROGRAM: Access Code: [facility] URL: [email or web address] Date: 04/27/2022  Prepared by: Alethia Berthold Maricella Filyaw  Exercises - Side Step Down with Counter Support  - 1 x daily - 7 x weekly - 3 sets - 10 reps - Forward Step Down with Heel Tap and Rail Support  - 1 x daily - 7 x weekly - 3 sets - 10 reps - Side Stepping with Resistance at Thighs  - 1 x daily - 7 x weekly - 3 sets - 10 reps - Forward Monster Walks  - 1 x daily - 7 x weekly - 3 sets - 10 reps - Backward Monster Walks  - 1 x daily - 7 x weekly - 3 sets - 10 reps - Squat with dumbbells   - 1 x daily - 7 x weekly - 3 sets - 10 reps - Bird Dog  - 1 x daily - 7 x weekly - 3 sets - 10 reps - Child's Pose Stretch  - 1 x daily - 7 x weekly - 3 sets - 10 reps - Tall Kneel Vertical Bridge  - 1 x daily - 7 x weekly - 3 sets - 10 reps - Dead Bug  - 1 x daily - 7 x weekly - 3 sets - 10 reps - Standing Anti-Rotation Press with Anchored Resistance  - 1 x daily - 7 x weekly - 3 sets - 10 reps - Standing march with overhead hold   - 1 x daily - 7 x weekly - 3 sets - 10 reps - Standing in corner on yoga mat with feet together   - 1 x daily - 7 x weekly - 3-4 reps - 30-45 seconds hold  Added PVC (can perform with broom or mop) passthroughs and around the worlds on 5/14 (provided word document with pictures)   GOALS: Goals reviewed with patient? Yes  SHORT TERM GOALS: Target date: 05/09/2022   Pt will be independent with initial HEP for improved strength, balance, transfers and gait.  Baseline: provided handout of HEP from previous POC on eval  Goal status: MET  2.  Pt will trial various braces on LLE to determine safest and least restrictive option to promote independence w/mobility.  Baseline:  Goal status: MET  3.  Minibest to be assessed and LTG updated  Baseline: 14/28 (on 4/25)  Goal status: MET  4.  Pt will ambulate greater than or equal to 1100 feet on with LRAD mod I for improved cardiovascular endurance  and BLE strength.   Baseline: 998' w/o AD and SBA; 1,076' w/SpryStep plus AFO and SBA  Goal status: IN PROGRESS    LONG TERM GOALS: Target date: 05/30/2022   Pt will be independent with final HEP for improved strength, balance, transfers and gait.  Baseline:  Goal status: INITIAL  2.  Pt will improve gait velocity to at least 3.5 ft/s with LRAD for improved gait efficiency and independence  Baseline: 3.36 ft/s with trekking poles  Goal status: INITIAL  3.  Pt will improve MiniBest to 20/28 for decreased fall risk and improvement with compensatory stepping strategies.   Baseline: 14/28 (4/25)  Goal status: REVISED  4.  Pt will ambulate greater than or equal to 1300 feet on with LRAD mod I for improved cardiovascular endurance and BLE strength.   Baseline: 998' w/o AD and SBA Goal status: REVISED   ASSESSMENT:  CLINICAL IMPRESSION: Emphasis of skilled PT session on assessing L shoulder ROM and strength, improving BUE mobility and reducing pain of L shoulder. Pt continues to be limited by LUE  shoulder/neck pain, unable to tolerate lying on her L side and sleep. Noted significant limitations in mobility of L shoulder w/extension and weakness in upper trap. Added mobility exercises to HEP to address this and pt did report decrease in LUE pain following session. Continue POC.     OBJECTIVE IMPAIRMENTS: Abnormal gait, decreased balance, decreased endurance, decreased mobility, difficulty walking, decreased strength, and impaired sensation  ACTIVITY LIMITATIONS: carrying, bending, squatting, stairs, transfers, bed mobility, locomotion level, and caring for others  PARTICIPATION LIMITATIONS: driving, shopping, community activity, occupation, and yard work  PERSONAL FACTORS: Fitness, Past/current experiences, and 1 comorbidity: RRMS  are also affecting patient's functional outcome.   REHAB POTENTIAL: Good  CLINICAL DECISION MAKING: Evolving/moderate complexity  EVALUATION  COMPLEXITY: Moderate  PLAN:  PT FREQUENCY: 2x/week  PT DURATION: 6 weeks  PLANNED INTERVENTIONS: Therapeutic exercises, Therapeutic activity, Neuromuscular re-education, Balance training, Gait training, Patient/Family education, Self Care, Joint mobilization, Stair training, Vestibular training, Canalith repositioning, Orthotic/Fit training, Aquatic Therapy, Dry Needling, Electrical stimulation, Manual therapy, and Re-evaluation  PLAN FOR NEXT SESSION:  Trial  Bioness. Review and update HEP. Walking on unlevel surfaces, dual tasking, half kneel wood chops, staggered DL, resisted gait, blaze pods. How is LUE?    Jill Alexanders Kyonna Frier, PT, DPT 05/23/2022, 9:36 AM

## 2022-05-25 ENCOUNTER — Ambulatory Visit: Payer: No Typology Code available for payment source | Admitting: Physical Therapy

## 2022-05-25 ENCOUNTER — Telehealth: Payer: Self-pay | Admitting: *Deleted

## 2022-05-25 NOTE — Telephone Encounter (Signed)
Faxed completed form back to [facility] at [phone]. Received fax confirmation

## 2022-05-30 ENCOUNTER — Ambulatory Visit: Payer: No Typology Code available for payment source | Admitting: Physical Therapy

## 2022-05-30 DIAGNOSIS — M6281 Muscle weakness (generalized): Secondary | ICD-10-CM

## 2022-05-30 DIAGNOSIS — R2681 Unsteadiness on feet: Secondary | ICD-10-CM

## 2022-05-30 DIAGNOSIS — R2689 Other abnormalities of gait and mobility: Secondary | ICD-10-CM

## 2022-05-30 NOTE — Therapy (Signed)
OUTPATIENT PHYSICAL THERAPY NEURO TREATMENT   Patient Name: Samantha Church MRN: [id number] DOB:07-May-1960, 62 y.o., female Today's Date: 05/30/2022   PCP: Loyola Mast, MD REFERRING PROVIDER: Asa Lente, MD  END OF SESSION:  PT End of Session - 05/30/22 0934     Visit Number 9    Number of Visits 13   Plus eval   Date for PT Re-Evaluation 06/06/22    Authorization Type [employer]    PT Start Time 0932    PT Stop Time 1017    PT Time Calculation (min) 45 min    Equipment Utilized During Treatment Gait belt    Activity Tolerance Patient tolerated treatment well    Behavior During Therapy WFL for tasks assessed/performed                    Past Medical History:  Diagnosis Date   Anemia    Hortons syndrome    Hypothyroidism    Dr. Leslie Dales   Lyme disease    Multiple sclerosis (HCC)    Neuromuscular disorder (HCC)    MS   Vision abnormalities    Past Surgical History:  Procedure Laterality Date   AUGMENTATION MAMMAPLASTY Bilateral 2003   saline   BREAST IMPLANT REMOVAL Bilateral 02/17/2019   BREAST SURGERY  2002   COLONOSCOPY  2008   DILATATION & CURRETTAGE/HYSTEROSCOPY WITH RESECTOCOPE N/A 10/09/2012   Procedure: DILATATION & CURETTAGE/HYSTEROSCOPY WITH RESECTOCOPE;  Surgeon: Zelphia Cairo, MD;  Location: [facility];  Service: Gynecology;  Laterality: N/A;  RESECTION with Versapoint    KNEE ARTHROSCOPY  1996   UTERINE FIBROID SURGERY  2019   fibroidectomy   Patient Active Problem List   Diagnosis Date Noted   SK (seborrheic keratosis) 01/25/2022   Stress incontinence, female 01/25/2022   Tinnitus of both ears 10/11/2021   Presbycusis of both ears 10/11/2021   Diplopia 04/20/2021   Balance problem 04/20/2021   Osteopenia 03/31/2021   Borderline hyperlipidemia 01/17/2021   History of Lyme disease 06/22/2020   High risk medication use 07/25/2018   Left leg weakness 10/14/2015   Hypothyroidism (acquired) 11/03/2014   Other fatigue  11/03/2014   Gait disorder 11/03/2014   Multiple sclerosis (HCC) 09/01/2008    ONSET DATE: 04/06/2022  REFERRING DIAG: G35 (ICD-10-CM) - Multiple sclerosis  THERAPY DIAG:  Unsteadiness on feet  Other abnormalities of gait and mobility  Muscle weakness (generalized)  Rationale for Evaluation and Treatment: Rehabilitation  SUBJECTIVE:  SUBJECTIVE STATEMENT: Pt reports doing well, neck pain is about the same. Her infusion last week went well. Trying to do her exercises, they do not make her pain better or worse.    Pt accompanied by: self  PERTINENT HISTORY: MRI of the brain 05/04/2021 showed T2/FLAIR hyperintense foci in the brainstem, cerebellum and hemispheres in a pattern consistent with chronic demyelinating plaque associated with multiple sclerosis. None of the foci enhance. However, one focus in the posterior pons on the right is new compared to the 2021 MRI and show subtle restricted diffusion. It is in a location that could cause diplopia   PAIN:  Are you having pain? Yes: NPRS scale: 3-4/10 Pain location: R upper trap Pain description: Achy/throb  PRECAUTIONS: Fall  WEIGHT BEARING RESTRICTIONS: No  FALLS: Has patient fallen in last 6 months? No  LIVING ENVIRONMENT: Lives with: lives with their spouse Lives in: House/apartment Stairs: No Has following equipment at home: Tour manager, Grab bars, and trekking poles  PLOF: Requires assistive device for independence  PATIENT GOALS: "Balance and being able to walk better and pick up this L leg"   OBJECTIVE:   COGNITION: Overall cognitive status: Within functional limits for tasks assessed   SENSATION: Pt reports numbness/tingling in BLEs (mostly in feet), light touch mostly impacted    POSTURE: rounded shoulders and forward  head   LOWER EXTREMITY MMT:  Tested in seated position   MMT Right Eval Left Eval  Hip flexion 4 3+  Hip extension    Hip abduction 4+ 4  Hip adduction 4+ 4  Hip internal rotation    Hip external rotation    Knee flexion 4 4-  Knee extension 4 4-  Ankle dorsiflexion 4 2+  Ankle plantarflexion    Ankle inversion    Ankle eversion    (Blank rows = not tested)  BED MOBILITY:  Independent per pt but difficulty swinging LLE into bed  TODAY'S TREATMENT:     Gait Training   Gait pattern: step through pattern, decreased arm swing- Right, decreased arm swing- Left, decreased hip/knee flexion- Left, decreased ankle dorsiflexion- Left, circumduction- Left, wide BOS, and abducted- Left Distance walked: 115' indoors and >400' outdoors on grass and sidewalk  Assistive device utilized:  [place] SpryStep Plus AFO  and None Level of assistance: SBA and Min A Comments: Trialed use of SpryStep Plus AFO, as pt will be obtaining this for personal use. On level surfaces, pt ambulates w/SBA w/mild circumduction of LLE. On grass, pt required min A due to lateral instability and L knee buckling, but no major LOB noted.   NMR - Entirety of session spent wearing AFO In hallway, lateral cone drill (sidestepping and stacking cones) to work on hip abduction strength, speed of stepping and set-switching. Pt performed two rounds (1st over 20' and second over 50') w/CGA. Min cues to avoid rotation to R side when stepping to R due to L hip adduction weakness.  4 blaze pods on random reach setting placed in large semicircle (from 6 to 3 o'clock) w/cues for pt to quickly jog to pod, tap w/UE and retro shuffle back to mat to work on set-switching, increased speed of stepping and BLE strength: Round 1: 11 hits w/CGA  Round 2: Added 6s timeout on pods to facilitate increased speed, 10 hits w/CGA Round 3: 9 hits w/CGA-min A due to anterior LOB w/fatigue RPE of 7-8/10 following session  Fwd gait x250' w/random  posterolateral perturbations (via second therapist) using blue resistance band  at hips for improved reactive balance strategies, CGA throughout. Pt did well w/stepping strategy bilaterally, demonstrating cross-over step w/RLE when pulled to L side.  Progressed to retro gait w/quick shift to anterior gait when posterior perturbation applied, x10 reps. Pt initially requiring several steps to shift from retro to forward gait but quickly improved speed of transition w/practice. No major LOB noted, CGA throughout       PATIENT EDUCATION: Education details: Continue HEP  Person educated: Patient Education method: Medical illustrator Education comprehension: verbalized understanding and returned demonstration  HOME EXERCISE PROGRAM: Access Code: [facility] URL: [email or web address] Date: 04/27/2022 Prepared by: Alethia Berthold Jalon Blackwelder  Exercises - Side Step Down with Counter Support  - 1 x daily - 7 x weekly - 3 sets - 10 reps - Forward Step Down with Heel Tap and Rail Support  - 1 x daily - 7 x weekly - 3 sets - 10 reps - Side Stepping with Resistance at Thighs  - 1 x daily - 7 x weekly - 3 sets - 10 reps - Forward Monster Walks  - 1 x daily - 7 x weekly - 3 sets - 10 reps - Backward Monster Walks  - 1 x daily - 7 x weekly - 3 sets - 10 reps - Squat with dumbbells   - 1 x daily - 7 x weekly - 3 sets - 10 reps - Bird Dog  - 1 x daily - 7 x weekly - 3 sets - 10 reps - Child's Pose Stretch  - 1 x daily - 7 x weekly - 3 sets - 10 reps - Tall Kneel Vertical Bridge  - 1 x daily - 7 x weekly - 3 sets - 10 reps - Dead Bug  - 1 x daily - 7 x weekly - 3 sets - 10 reps - Standing Anti-Rotation Press with Anchored Resistance  - 1 x daily - 7 x weekly - 3 sets - 10 reps - Standing march with overhead hold   - 1 x daily - 7 x weekly - 3 sets - 10 reps - Standing in corner on yoga mat with feet together   - 1 x daily - 7 x weekly - 3-4 reps - 30-45 seconds hold  Added PVC (can perform with  broom or mop) passthroughs and around the worlds on 5/14 (provided word document with pictures)   GOALS: Goals reviewed with patient? Yes  SHORT TERM GOALS: Target date: 05/09/2022   Pt will be independent with initial HEP for improved strength, balance, transfers and gait.  Baseline: provided handout of HEP from previous POC on eval  Goal status: MET  2.  Pt will trial various braces on LLE to determine safest and least restrictive option to promote independence w/mobility.  Baseline:  Goal status: MET  3.  Minibest to be assessed and LTG updated  Baseline: 14/28 (on 4/25)  Goal status: MET  4.  Pt will ambulate greater than or equal to 1100 feet on with LRAD mod I for improved cardiovascular endurance and BLE strength.   Baseline: 998' w/o AD and SBA; 1,076' w/SpryStep plus AFO and SBA  Goal status: IN PROGRESS    LONG TERM GOALS: Target date: 05/30/2022   Pt will be independent with final HEP for improved strength, balance, transfers and gait.  Baseline:  Goal status: INITIAL  2.  Pt will improve gait velocity to at least 3.5 ft/s with LRAD for improved gait efficiency and independence  Baseline: 3.36 ft/s with trekking  poles  Goal status: INITIAL  3.  Pt will improve MiniBest to 20/28 for decreased fall risk and improvement with compensatory stepping strategies.   Baseline: 14/28 (4/25)  Goal status: REVISED  4.  Pt will ambulate greater than or equal to 1300 feet on with LRAD mod I for improved cardiovascular endurance and BLE strength.   Baseline: 998' w/o AD and SBA Goal status: REVISED   ASSESSMENT:  CLINICAL IMPRESSION: Emphasis of skilled PT session on reactive and anticipatory balance strategies, LE coordination, BLE strength and increased velocity of movement. Pt continues to report pain in R upper trap that does not respond to HEP, but HEP does not make pain worse. Pt continues to be limited by LLE weakness, specifically in hip flexor and  add/abd, but is mor stable w/use of AFO and has improved since eval. Pt most challenged by high velocity movement due to catching of LLE but was able to facilitate increased step clearance with practice. Continue POC.    OBJECTIVE IMPAIRMENTS: Abnormal gait, decreased balance, decreased endurance, decreased mobility, difficulty walking, decreased strength, and impaired sensation  ACTIVITY LIMITATIONS: carrying, bending, squatting, stairs, transfers, bed mobility, locomotion level, and caring for others  PARTICIPATION LIMITATIONS: driving, shopping, community activity, occupation, and yard work  PERSONAL FACTORS: Fitness, Past/current experiences, and 1 comorbidity: RRMS  are also affecting patient's functional outcome.   REHAB POTENTIAL: Good  CLINICAL DECISION MAKING: Evolving/moderate complexity  EVALUATION COMPLEXITY: Moderate  PLAN:  PT FREQUENCY: 2x/week  PT DURATION: 6 weeks  PLANNED INTERVENTIONS: Therapeutic exercises, Therapeutic activity, Neuromuscular re-education, Balance training, Gait training, Patient/Family education, Self Care, Joint mobilization, Stair training, Vestibular training, Canalith repositioning, Orthotic/Fit training, Aquatic Therapy, Dry Needling, Electrical stimulation, Manual therapy, and Re-evaluation  PLAN FOR NEXT SESSION:  Goal assessment. Trial  Bioness. Review and update HEP. Walking on unlevel surfaces, dual tasking, half kneel wood chops, staggered DL, resisted gait, blaze pods. How is LUE?    Jill Alexanders Houston Zapien, PT, DPT 05/30/2022, 10:18 AM

## 2022-06-01 ENCOUNTER — Ambulatory Visit: Payer: No Typology Code available for payment source | Admitting: Physical Therapy

## 2022-06-01 DIAGNOSIS — R2689 Other abnormalities of gait and mobility: Secondary | ICD-10-CM

## 2022-06-01 DIAGNOSIS — R2681 Unsteadiness on feet: Secondary | ICD-10-CM | POA: Diagnosis not present

## 2022-06-01 DIAGNOSIS — R29818 Other symptoms and signs involving the nervous system: Secondary | ICD-10-CM

## 2022-06-01 DIAGNOSIS — M6281 Muscle weakness (generalized): Secondary | ICD-10-CM

## 2022-06-01 NOTE — Therapy (Signed)
OUTPATIENT PHYSICAL THERAPY NEURO TREATMENT-10TH VISIT PROGRESS NOTE   Patient Name: Samantha Church MRN: [id number] DOB:1960/11/10, 62 y.o., female Today's Date: 06/01/2022   PCP: Loyola Mast, MD REFERRING PROVIDER: Asa Lente, MD  Physical Therapy Progress Note   Dates of Reporting Period: 04/18/22 - 06/01/22  See Note below for Objective Data and Assessment of Progress/Goals.  Thank you for the referral of this patient. Peter Congo, PT, DPT, CSRS   END OF SESSION:  PT End of Session - 06/01/22 0934     Visit Number 10    Number of Visits 13   Plus eval   Date for PT Re-Evaluation 06/06/22    Authorization Type [employer]    PT Start Time 0930    PT Stop Time 1015    PT Time Calculation (min) 45 min    Equipment Utilized During Treatment Gait belt    Activity Tolerance Patient tolerated treatment well    Behavior During Therapy WFL for tasks assessed/performed                     Past Medical History:  Diagnosis Date   Anemia    Hortons syndrome    Hypothyroidism    Dr. Leslie Dales   Lyme disease    Multiple sclerosis (HCC)    Neuromuscular disorder (HCC)    MS   Vision abnormalities    Past Surgical History:  Procedure Laterality Date   AUGMENTATION MAMMAPLASTY Bilateral 2003   saline   BREAST IMPLANT REMOVAL Bilateral 02/17/2019   BREAST SURGERY  2002   COLONOSCOPY  2008   DILATATION & CURRETTAGE/HYSTEROSCOPY WITH RESECTOCOPE N/A 10/09/2012   Procedure: DILATATION & CURETTAGE/HYSTEROSCOPY WITH RESECTOCOPE;  Surgeon: Zelphia Cairo, MD;  Location: [facility];  Service: Gynecology;  Laterality: N/A;  RESECTION with Versapoint    KNEE ARTHROSCOPY  1996   UTERINE FIBROID SURGERY  2019   fibroidectomy   Patient Active Problem List   Diagnosis Date Noted   SK (seborrheic keratosis) 01/25/2022   Stress incontinence, female 01/25/2022   Tinnitus of both ears 10/11/2021   Presbycusis of both ears 10/11/2021   Diplopia 04/20/2021    Balance problem 04/20/2021   Osteopenia 03/31/2021   Borderline hyperlipidemia 01/17/2021   History of Lyme disease 06/22/2020   High risk medication use 07/25/2018   Left leg weakness 10/14/2015   Hypothyroidism (acquired) 11/03/2014   Other fatigue 11/03/2014   Gait disorder 11/03/2014   Multiple sclerosis (HCC) 09/01/2008    ONSET DATE: 04/06/2022  REFERRING DIAG: G35 (ICD-10-CM) - Multiple sclerosis  THERAPY DIAG:  Unsteadiness on feet  Other abnormalities of gait and mobility  Muscle weakness (generalized)  Other symptoms and signs involving the nervous system  Rationale for Evaluation and Treatment: Rehabilitation  SUBJECTIVE:  SUBJECTIVE STATEMENT: Pt reports that she slept on her L side last night and woke up with more pain in her L shoulder and across her lower neck/upper back area. Pt reports her pain is 4/10 today.  Pt accompanied by: self  PERTINENT HISTORY: MRI of the brain 05/04/2021 showed T2/FLAIR hyperintense foci in the brainstem, cerebellum and hemispheres in a pattern consistent with chronic demyelinating plaque associated with multiple sclerosis. None of the foci enhance. However, one focus in the posterior pons on the right is new compared to the 2021 MRI and show subtle restricted diffusion. It is in a location that could cause diplopia   PAIN:  Are you having pain? Yes: NPRS scale: 3-4/10 Pain location: R upper trap Pain description: Achy/throb  PRECAUTIONS: Fall  WEIGHT BEARING RESTRICTIONS: No  FALLS: Has patient fallen in last 6 months? No  LIVING ENVIRONMENT: Lives with: lives with their spouse Lives in: House/apartment Stairs: No Has following equipment at home: Tour manager, Grab bars, and trekking poles  PLOF: Requires assistive device for  independence  PATIENT GOALS: "Balance and being able to walk better and pick up this L leg"   OBJECTIVE:   COGNITION: Overall cognitive status: Within functional limits for tasks assessed   SENSATION: Pt reports numbness/tingling in BLEs (mostly in feet), light touch mostly impacted    POSTURE: rounded shoulders and forward head   LOWER EXTREMITY MMT:  Tested in seated position   MMT Right Eval Left Eval  Hip flexion 4 3+  Hip extension    Hip abduction 4+ 4  Hip adduction 4+ 4  Hip internal rotation    Hip external rotation    Knee flexion 4 4-  Knee extension 4 4-  Ankle dorsiflexion 4 2+  Ankle plantarflexion    Ankle inversion    Ankle eversion    (Blank rows = not tested)  BED MOBILITY:  Independent per pt but difficulty swinging LLE into bed  TODAY'S TREATMENT:      NMR Half kneel ball punch-outs 2 x 10 reps R/L with CGA  Manual Therapy: Supine suboccipital release 4 x 30 sec each  TherAct: Trigger Point Dry-Needling  Treatment instructions: Expect mild to moderate muscle soreness. S/S of pneumothorax if dry needled over a lung field, and to seek immediate medical attention should they occur. Patient verbalized understanding of these instructions and education.  Patient Consent Given: Yes Education handout provided: Previously provided Muscles treated: L and R suboccipitals, L splenius capitus, L and R upper traps Treatment response/outcome:  deep ache/pressure; very sensitive in L suboccipitals and L splenius muscles  For goal assessment:  [facility] PT Assessment - 06/01/22 0959       Ambulation/Gait   Gait velocity 32.8 ft over 9.38 sec = 3.5 ft/sec   with L AFO and bilateral trekking poles             PATIENT EDUCATION: Education details: Continue HEP, added to HEP, encouraged use of heat for lower neck/shoulder pain Person educated: Patient Education method: Medical illustrator Education comprehension: verbalized understanding  and returned demonstration  HOME EXERCISE PROGRAM: Access Code: [facility] URL: [email or web address] Date: 04/27/2022 Prepared by: Alethia Berthold Plaster  Exercises - Side Step Down with Counter Support  - 1 x daily - 7 x weekly - 3 sets - 10 reps - Forward Step Down with Heel Tap and Rail Support  - 1 x daily - 7 x weekly - 3 sets - 10 reps - Side Stepping with Resistance at [employer]  -  1 x daily - 7 x weekly - 3 sets - 10 reps - Forward Monster Walks  - 1 x daily - 7 x weekly - 3 sets - 10 reps - Backward Monster Walks  - 1 x daily - 7 x weekly - 3 sets - 10 reps - Squat with dumbbells   - 1 x daily - 7 x weekly - 3 sets - 10 reps - Bird Dog  - 1 x daily - 7 x weekly - 3 sets - 10 reps - Child's Pose Stretch  - 1 x daily - 7 x weekly - 3 sets - 10 reps - Tall Kneel Vertical Bridge  - 1 x daily - 7 x weekly - 3 sets - 10 reps - Dead Bug  - 1 x daily - 7 x weekly - 3 sets - 10 reps - Standing Anti-Rotation Press with Anchored Resistance  - 1 x daily - 7 x weekly - 3 sets - 10 reps - Standing march with overhead hold   - 1 x daily - 7 x weekly - 3 sets - 10 reps - Standing in corner on yoga mat with feet together   - 1 x daily - 7 x weekly - 3-4 reps - 30-45 seconds hold - Supine Suboccipital Release with Tennis Balls  - 1 x daily - 7 x weekly - 1 sets - 1 reps - 5-10 minutes hold  Added PVC (can perform with broom or mop) passthroughs and around the worlds on 5/14 (provided word document with pictures)   GOALS: Goals reviewed with patient? Yes  SHORT TERM GOALS: Target date: 05/09/2022   Pt will be independent with initial HEP for improved strength, balance, transfers and gait.  Baseline: provided handout of HEP from previous POC on eval  Goal status: MET  2.  Pt will trial various braces on LLE to determine safest and least restrictive option to promote independence w/mobility.  Baseline:  Goal status: MET  3.  Minibest to be assessed and LTG updated  Baseline: 14/28 (on  4/25)  Goal status: MET  4.  Pt will ambulate greater than or equal to 1100 feet on with LRAD mod I for improved cardiovascular endurance and BLE strength.   Baseline: 998' w/o AD and SBA; 1,076' w/SpryStep plus AFO and SBA  Goal status: IN PROGRESS    LONG TERM GOALS: Target date: 06/06/2022 (updated to match cert date)   Pt will be independent with final HEP for improved strength, balance, transfers and gait.  Baseline:  Goal status: INITIAL  2.  Pt will improve gait velocity to at least 3.5 ft/s with LRAD for improved gait efficiency and independence  Baseline: 3.36 ft/s with trekking poles, 3.5 ft/sec with L AFO and trekking poles (5/23)  Goal status: MET  3.  Pt will improve MiniBest to 20/28 for decreased fall risk and improvement with compensatory stepping strategies.   Baseline: 14/28 (4/25)  Goal status: REVISED  4.  Pt will ambulate greater than or equal to 1300 feet on with LRAD mod I for improved cardiovascular endurance and BLE strength.   Baseline: 998' w/o AD and SBA Goal status: REVISED   ASSESSMENT:  CLINICAL IMPRESSION: Emphasis of skilled PT session on performing TPDN to address ongoing pain/tightness in neck and shoulder region, adding to HEP to address tightness in neck musculature, and working on hip strengthening and core stability. Pt with fair tolerance to TPDN this session with onset of pain/pressure with needle barely inserted,  may benefit from future trials after working on stretching out muscles to decrease sensitivity. Pt continues to benefit from skilled therapy services to work towards her LTGs. Continue POC.    OBJECTIVE IMPAIRMENTS: Abnormal gait, decreased balance, decreased endurance, decreased mobility, difficulty walking, decreased strength, and impaired sensation  ACTIVITY LIMITATIONS: carrying, bending, squatting, stairs, transfers, bed mobility, locomotion level, and caring for others  PARTICIPATION LIMITATIONS: driving,  shopping, community activity, occupation, and yard work  PERSONAL FACTORS: Fitness, Past/current experiences, and 1 comorbidity: RRMS  are also affecting patient's functional outcome.   REHAB POTENTIAL: Good  CLINICAL DECISION MAKING: Evolving/moderate complexity  EVALUATION COMPLEXITY: Moderate  PLAN:  PT FREQUENCY: 2x/week  PT DURATION: 6 weeks  PLANNED INTERVENTIONS: Therapeutic exercises, Therapeutic activity, Neuromuscular re-education, Balance training, Gait training, Patient/Family education, Self Care, Joint mobilization, Stair training, Vestibular training, Canalith repositioning, Orthotic/Fit training, Aquatic Therapy, Dry Needling, Electrical stimulation, Manual therapy, and Re-evaluation  PLAN FOR NEXT SESSION:  Trial  Bioness. Review and update HEP. Walking on unlevel surfaces, dual tasking, half kneel wood chops, staggered DL, resisted gait, blaze pods. How is LUE? How is neck and shoulder area--did pt try heating pad?   Peter Congo, PT, DPT, CSRS 06/01/2022, 10:15 AM

## 2022-06-06 ENCOUNTER — Ambulatory Visit: Payer: No Typology Code available for payment source | Admitting: Physical Therapy

## 2022-06-06 DIAGNOSIS — G8929 Other chronic pain: Secondary | ICD-10-CM

## 2022-06-06 DIAGNOSIS — R2681 Unsteadiness on feet: Secondary | ICD-10-CM | POA: Diagnosis not present

## 2022-06-06 DIAGNOSIS — M6281 Muscle weakness (generalized): Secondary | ICD-10-CM

## 2022-06-06 NOTE — Therapy (Signed)
OUTPATIENT PHYSICAL THERAPY NEURO TREATMENT- RECERTIFICATION   Patient Name: Samantha Church MRN: [id number] DOB:10-05-1960, 62 y.o., female Today's Date: 06/06/2022   PCP: Loyola Mast, MD REFERRING PROVIDER: Asa Lente, MD    END OF SESSION:  PT End of Session - 06/06/22 0848     Visit Number 11    Number of Visits 13   Plus eval   Date for PT Re-Evaluation 06/20/22   Recert   Authorization Type [employer]    PT Start Time 0846    PT Stop Time 0930    PT Time Calculation (min) 44 min    Equipment Utilized During Treatment Gait belt    Activity Tolerance Patient tolerated treatment well    Behavior During Therapy WFL for tasks assessed/performed                   Past Medical History:  Diagnosis Date   Anemia    Hortons syndrome    Hypothyroidism    Dr. Leslie Dales   Lyme disease    Multiple sclerosis (HCC)    Neuromuscular disorder (HCC)    MS   Vision abnormalities    Past Surgical History:  Procedure Laterality Date   AUGMENTATION MAMMAPLASTY Bilateral 2003   saline   BREAST IMPLANT REMOVAL Bilateral 02/17/2019   BREAST SURGERY  2002   COLONOSCOPY  2008   DILATATION & CURRETTAGE/HYSTEROSCOPY WITH RESECTOCOPE N/A 10/09/2012   Procedure: DILATATION & CURETTAGE/HYSTEROSCOPY WITH RESECTOCOPE;  Surgeon: Zelphia Cairo, MD;  Location: [facility];  Service: Gynecology;  Laterality: N/A;  RESECTION with Versapoint    KNEE ARTHROSCOPY  1996   UTERINE FIBROID SURGERY  2019   fibroidectomy   Patient Active Problem List   Diagnosis Date Noted   SK (seborrheic keratosis) 01/25/2022   Stress incontinence, female 01/25/2022   Tinnitus of both ears 10/11/2021   Presbycusis of both ears 10/11/2021   Diplopia 04/20/2021   Balance problem 04/20/2021   Osteopenia 03/31/2021   Borderline hyperlipidemia 01/17/2021   History of Lyme disease 06/22/2020   High risk medication use 07/25/2018   Left leg weakness 10/14/2015   Hypothyroidism (acquired)  11/03/2014   Other fatigue 11/03/2014   Gait disorder 11/03/2014   Multiple sclerosis (HCC) 09/01/2008    ONSET DATE: 04/06/2022  REFERRING DIAG: G35 (ICD-10-CM) - Multiple sclerosis  THERAPY DIAG:  Muscle weakness (generalized)  Chronic left shoulder pain  Rationale for Evaluation and Treatment: Rehabilitation  SUBJECTIVE:  SUBJECTIVE STATEMENT: Pt reports she may return to her ortho to discuss her shoulder/neck pain, as it is not getting better. No falls. Has appointment with [facility] on Friday (5/31) to hopefully obtain brace.   Pt accompanied by: self  PERTINENT HISTORY: MRI of the brain 05/04/2021 showed T2/FLAIR hyperintense foci in the brainstem, cerebellum and hemispheres in a pattern consistent with chronic demyelinating plaque associated with multiple sclerosis. None of the foci enhance. However, one focus in the posterior pons on the right is new compared to the 2021 MRI and show subtle restricted diffusion. It is in a location that could cause diplopia   PAIN:  Are you having pain? Yes: NPRS scale: 5/10 Pain location: R cervical spine/shoulder Pain description: Achy/throb  PRECAUTIONS: Fall  WEIGHT BEARING RESTRICTIONS: No  FALLS: Has patient fallen in last 6 months? No  LIVING ENVIRONMENT: Lives with: lives with their spouse Lives in: House/apartment Stairs: No Has following equipment at home: Tour manager, Grab bars, and trekking poles  PLOF: Requires assistive device for independence  PATIENT GOALS: "Balance and being able to walk better and pick up this L leg"   OBJECTIVE:   COGNITION: Overall cognitive status: Within functional limits for tasks assessed   SENSATION: Pt reports numbness/tingling in BLEs (mostly in feet), light touch mostly impacted    POSTURE:  rounded shoulders and forward head   LOWER EXTREMITY MMT:  Tested in seated position   MMT Right Eval Left Eval  Hip flexion 4 3+  Hip extension    Hip abduction 4+ 4  Hip adduction 4+ 4  Hip internal rotation    Hip external rotation    Knee flexion 4 4-  Knee extension 4 4-  Ankle dorsiflexion 4 2+  Ankle plantarflexion    Ankle inversion    Ankle eversion    (Blank rows = not tested)  BED MOBILITY:  Independent per pt but difficulty swinging LLE into bed  TODAY'S TREATMENT:     Ther Ex  Seated I, Y, and T's w/2# dumbbells for improved shoulder stability, ROM and periscapular strength, x8 reps. Min cues to reduce shrug compensation of LUE, which pt able to do if concentrating. Progressed to x5 reps while standing and noted improved periscapualr strength and decreased shoulder shrug of LUE. Pt reported feeling more stable in standing position compared to seated.  At ballet bar, scapular push ups, x10 reps, for improved periscapular strength and postural control. Pt denied pain w/activity and performed well, although challenging for pt. Added to HEP (see bolded below).  Standing 90-90 pec stretch in doorway, x60s per side. Pt reported feeling stretch at inferior angle of L scapula and not at pec minor, so discontinued.  Seated shoulder W's w/yellow resistance band, x10 reps, for improved shoulder ER and periscapular strength. Pt denied pain w/activity.  Seated shoulder abduction w/yellow resistance band, x10 reps. Min cues to reduce shoulder shrug compensation on L side.   Ther Act Discussed etiologies of shoulder pain and potential to request cervical spine MRI as well as L shoulder MRI as pt is experiencing numbness of L hand. Pt reports pain along long head of biceps tendon but no strength deficits w/elbow or shoulder flexion. Noted significant tightness in upper trap on L side this date which did reduce by end of session. Reviewed pt's X-rays from 2/24 and noted adequate join  space in L shoulder.   PATIENT EDUCATION: Education details: additions to HEP, see ther act Person educated: Patient Education method: Psychiatrist  comprehension: verbalized understanding and returned demonstration  HOME EXERCISE PROGRAM: Access Code: [facility] URL: [email or web address] Date: 04/27/2022 Prepared by: Alethia Berthold Tniyah Nakagawa  Exercises - Side Step Down with Counter Support  - 1 x daily - 7 x weekly - 3 sets - 10 reps - Forward Step Down with Heel Tap and Rail Support  - 1 x daily - 7 x weekly - 3 sets - 10 reps - Side Stepping with Resistance at Thighs  - 1 x daily - 7 x weekly - 3 sets - 10 reps - Forward Monster Walks  - 1 x daily - 7 x weekly - 3 sets - 10 reps - Backward Monster Walks  - 1 x daily - 7 x weekly - 3 sets - 10 reps - Squat with dumbbells   - 1 x daily - 7 x weekly - 3 sets - 10 reps - Bird Dog  - 1 x daily - 7 x weekly - 3 sets - 10 reps - Child's Pose Stretch  - 1 x daily - 7 x weekly - 3 sets - 10 reps - Tall Kneel Vertical Bridge  - 1 x daily - 7 x weekly - 3 sets - 10 reps - Dead Bug  - 1 x daily - 7 x weekly - 3 sets - 10 reps - Standing Anti-Rotation Press with Anchored Resistance  - 1 x daily - 7 x weekly - 3 sets - 10 reps - Standing march with overhead hold   - 1 x daily - 7 x weekly - 3 sets - 10 reps - Standing in corner on yoga mat with feet together   - 1 x daily - 7 x weekly - 3-4 reps - 30-45 seconds hold - Supine Suboccipital Release with Tennis Balls  - 1 x daily - 7 x weekly - 1 sets - 1 reps - 5-10 minutes hold - Scapular Protraction at Wall  - 1 x daily - 7 x weekly - 3 sets - 10 reps  Added PVC (can perform with broom or mop) passthroughs and around the worlds on 5/14 (provided word document with pictures)   GOALS: Goals reviewed with patient? Yes  LONG TERM GOALS: Target date: 06/06/2022 (updated to match cert date)   Pt will be independent with final HEP for improved strength, balance,  transfers and gait.  Baseline:  Goal status: IN PROGRESS  2.  Pt will improve gait velocity to at least 3.5 ft/s with LRAD for improved gait efficiency and independence  Baseline: 3.36 ft/s with trekking poles, 3.5 ft/sec with L AFO and trekking poles (5/23)  Goal status: MET  3.  Pt will improve MiniBest to 20/28 for decreased fall risk and improvement with compensatory stepping strategies.   Baseline: 14/28 (4/25)  Goal status: REVISED  4.  Pt will ambulate greater than or equal to 1300 feet on with LRAD mod I for improved cardiovascular endurance and BLE strength.   Baseline: 998' w/o AD and SBA Goal status: REVISED  NEW LONG TERM GOALS FOR EXTENDED POC:  Target date: 06/20/2022  Pt will ambulate greater than or equal to 1300 feet on with LRAD mod I for improved cardiovascular endurance and BLE strength.  Baseline: 998' w/o AD and SBA Goal status: IN PROGRESS  2.  Pt will improve MiniBest to 20/28 for decreased fall risk and improvement with compensatory stepping strategies.  Baseline: 14/28 (4/25) Goal status: INITIAL  3.  Pt will be independent with final HEP for improved strength, balance,  transfers and gait. Baseline:  Goal status: IN PROGRESS    ASSESSMENT:  CLINICAL IMPRESSION: Emphasis of skilled PT session on LTG assessment and improved periscapular strength for reduced L shoulder pain. Pt has met 1 of 4 LTGs, which was met last session, improving her gait speed w/use of AFO and trekking poles. Pt to obtain personal AFO this week. Pt is progressing towards remainder of LTGs but continues to be limited by significant L shoulder and neck pain, which has not been reduced w/TPDN or modalities. Pt reports numbness of arm while sleeping and is unable to sleep on her L side due to pain. Pt does not demonstrate strength deficit of L rotator cuff and pain follows a C4 dermatomal pattern, so encouraged pt to request cervical spine MRI as well as L shoulder MRI. Pt  does report increased pain in close packed positions of L shoulder, indicative of impingement. Continue POC.    OBJECTIVE IMPAIRMENTS: Abnormal gait, decreased balance, decreased endurance, decreased mobility, difficulty walking, decreased strength, and impaired sensation  ACTIVITY LIMITATIONS: carrying, bending, squatting, stairs, transfers, bed mobility, locomotion level, and caring for others  PARTICIPATION LIMITATIONS: driving, shopping, community activity, occupation, and yard work  PERSONAL FACTORS: Fitness, Past/current experiences, and 1 comorbidity: RRMS  are also affecting patient's functional outcome.   REHAB POTENTIAL: Good  CLINICAL DECISION MAKING: Evolving/moderate complexity  EVALUATION COMPLEXITY: Moderate  PLAN:  PT FREQUENCY: 2x/week  PT DURATION: 6 weeks  PLANNED INTERVENTIONS: Therapeutic exercises, Therapeutic activity, Neuromuscular re-education, Balance training, Gait training, Patient/Family education, Self Care, Joint mobilization, Stair training, Vestibular training, Canalith repositioning, Orthotic/Fit training, Aquatic Therapy, Dry Needling, Electrical stimulation, Manual therapy, and Re-evaluation  PLAN FOR NEXT SESSION:  Trial  Bioness. Review and update HEP. Walking on unlevel surfaces, dual tasking, half kneel wood chops, staggered DL, resisted gait, blaze pods. How is LUE? How is neck and shoulder area--did pt try heating pad? TM   Jill Alexanders Oval Moralez, PT, DPT 06/06/2022, 9:32 AM

## 2022-06-13 ENCOUNTER — Ambulatory Visit: Payer: No Typology Code available for payment source | Admitting: Physical Therapy

## 2022-06-13 DIAGNOSIS — M6281 Muscle weakness (generalized): Secondary | ICD-10-CM

## 2022-06-13 DIAGNOSIS — R2681 Unsteadiness on feet: Secondary | ICD-10-CM

## 2022-06-13 DIAGNOSIS — R2689 Other abnormalities of gait and mobility: Secondary | ICD-10-CM

## 2022-06-13 NOTE — Therapy (Signed)
Patient Name: Samantha Church MRN: [id number] DOB:Aug 26, 1960, 62 y.o., female Today's Date: 06/13/2022   PT End of Session - 06/13/22 0947     Visit Number 11   Arrived no charge   Number of Visits 13   Plus eval   Date for PT Re-Evaluation 06/20/22   Recert   Authorization Type [employer]    PT Start Time [phone]    PT Stop Time 1022    PT Time Calculation (min) 45 min    Equipment Utilized During Treatment --    Activity Tolerance Patient tolerated treatment well    Behavior During Therapy [facility] for tasks assessed/performed              Pt arrived to scheduled PT session w/new L SpryStep Plus AFO. Pt reports she wore it all weekend and it is causing her heel to slip out of her shoe. Attempted to wrap coban around strut to provide some cushion and reduce friction against brace, but pt's L heel still slipping. Compared pt's brace to clinic's brace and noted pt's brace does not sit flat in shoe, as it tilts medially. Contacted orthotist while pt in clinic and informed him of situation. Pt to go to [facility] to have brace adjusted and return to therapist to assess. Pt in agreement with plan.    Jill Alexanders Gaurav Baldree, PT, DPT 06/13/2022, 10:27 AM

## 2022-06-20 ENCOUNTER — Encounter: Payer: Self-pay | Admitting: Physical Therapy

## 2022-06-20 ENCOUNTER — Ambulatory Visit: Payer: No Typology Code available for payment source | Attending: Neurology | Admitting: Physical Therapy

## 2022-06-20 NOTE — Therapy (Signed)
[facility] [address] [place], [place], [address] Phone: [phone]   Fax:  [phone]  Patient Details  Name: Samantha Church MRN: [id number] Date of Birth: [birth year] Referring Provider:  No ref. provider found  Encounter Date: 06/20/2022  Called pt regarding no-show to today's scheduled PT appointment. Pt reports she got her time mixed up, rescheduled for next week.   Jill Alexanders Abdirizak Richison, PT, DPT 06/20/2022, 9:13 AM  [facility] [address] [place], [place], [address] Phone: [phone]   Fax:  [phone]

## 2022-06-26 ENCOUNTER — Ambulatory Visit: Payer: No Typology Code available for payment source | Admitting: Physical Therapy

## 2022-06-26 DIAGNOSIS — M6281 Muscle weakness (generalized): Secondary | ICD-10-CM | POA: Diagnosis present

## 2022-06-26 DIAGNOSIS — R2689 Other abnormalities of gait and mobility: Secondary | ICD-10-CM

## 2022-06-26 DIAGNOSIS — R2681 Unsteadiness on feet: Secondary | ICD-10-CM | POA: Diagnosis present

## 2022-06-26 NOTE — Therapy (Signed)
OUTPATIENT PHYSICAL THERAPY NEURO TREATMENT- RECERTIFICATION AND DISCHARGE SUMMARY   Patient Name: Samantha Church MRN: [id number] DOB:[birth year], 62 y.o., female Today's Date: 06/26/2022   PCP: Loyola Mast, MD REFERRING PROVIDER: Asa Lente, MD  PHYSICAL THERAPY DISCHARGE SUMMARY  Visits from Start of Care: 12  Current functional level related to goals / functional outcomes: Mod I w/mobility using L AFO and trekking poles when fatigued    Remaining deficits: LLE weakness, decreased endurance    Education / Equipment: HEP, L AFO   Patient agrees to discharge. Patient goals were partially met. Patient is being discharged due to being pleased with the current functional level.   END OF SESSION:  PT End of Session - 06/26/22 0852     Visit Number 12    Number of Visits 13   Plus eval   Date for PT Re-Evaluation 06/26/22   Recert   Authorization Type [employer]    PT Start Time 0849    PT Stop Time 0930    PT Time Calculation (min) 41 min    Activity Tolerance Patient tolerated treatment well    Behavior During Therapy WFL for tasks assessed/performed                    Past Medical History:  Diagnosis Date   Anemia    Hortons syndrome    Hypothyroidism    Dr. Leslie Dales   Lyme disease    Multiple sclerosis (HCC)    Neuromuscular disorder (HCC)    MS   Vision abnormalities    Past Surgical History:  Procedure Laterality Date   AUGMENTATION MAMMAPLASTY Bilateral 2003   saline   BREAST IMPLANT REMOVAL Bilateral 02/17/2019   BREAST SURGERY  2002   COLONOSCOPY  2008   DILATATION & CURRETTAGE/HYSTEROSCOPY WITH RESECTOCOPE N/A 10/09/2012   Procedure: DILATATION & CURETTAGE/HYSTEROSCOPY WITH RESECTOCOPE;  Surgeon: Zelphia Cairo, MD;  Location: [facility];  Service: Gynecology;  Laterality: N/A;  RESECTION with Versapoint    KNEE ARTHROSCOPY  1996   UTERINE FIBROID SURGERY  2019   fibroidectomy   Patient Active Problem List   Diagnosis Date  Noted   SK (seborrheic keratosis) 01/25/2022   Stress incontinence, female 01/25/2022   Tinnitus of both ears 10/11/2021   Presbycusis of both ears 10/11/2021   Diplopia 04/20/2021   Balance problem 04/20/2021   Osteopenia 03/31/2021   Borderline hyperlipidemia 01/17/2021   History of Lyme disease 06/22/2020   High risk medication use 07/25/2018   Left leg weakness 10/14/2015   Hypothyroidism (acquired) 11/03/2014   Other fatigue 11/03/2014   Gait disorder 11/03/2014   Multiple sclerosis (HCC) 09/01/2008    ONSET DATE: 04/06/2022  REFERRING DIAG: G35 (ICD-10-CM) - Multiple sclerosis  THERAPY DIAG:  Muscle weakness (generalized)  Unsteadiness on feet  Other abnormalities of gait and mobility  Rationale for Evaluation and Treatment: Rehabilitation  SUBJECTIVE:  SUBJECTIVE STATEMENT: Pt reports to clinic wearing new L AFO. States she got her MRI results back, wants to review them. No falls.   Pt accompanied by: self  PERTINENT HISTORY: MRI of the brain 05/04/2021 showed T2/FLAIR hyperintense foci in the brainstem, cerebellum and hemispheres in a pattern consistent with chronic demyelinating plaque associated with multiple sclerosis. None of the foci enhance. However, one focus in the posterior pons on the right is new compared to the 2021 MRI and show subtle restricted diffusion. It is in a location that could cause diplopia   PAIN:  Are you having pain? Yes: NPRS scale: 6/10 Pain location: R cervical spine/shoulder Pain description: Achy/throb  PRECAUTIONS: Fall  WEIGHT BEARING RESTRICTIONS: No  FALLS: Has patient fallen in last 6 months? No  LIVING ENVIRONMENT: Lives with: lives with their spouse Lives in: House/apartment Stairs: No Has following equipment at home: Tour manager,  Grab bars, and trekking poles  PLOF: Requires assistive device for independence  PATIENT GOALS: "Balance and being able to walk better and pick up this L leg"   OBJECTIVE:   COGNITION: Overall cognitive status: Within functional limits for tasks assessed   SENSATION: Pt reports numbness/tingling in BLEs (mostly in feet), light touch mostly impacted    POSTURE: rounded shoulders and forward head   LOWER EXTREMITY MMT:  Tested in seated position   MMT Right Eval Left Eval  Hip flexion 4 3+  Hip extension    Hip abduction 4+ 4  Hip adduction 4+ 4  Hip internal rotation    Hip external rotation    Knee flexion 4 4-  Knee extension 4 4-  Ankle dorsiflexion 4 2+  Ankle plantarflexion    Ankle inversion    Ankle eversion    (Blank rows = not tested)  BED MOBILITY:  Independent per pt but difficulty swinging LLE into bed  TODAY'S TREATMENT:      Ther Act LTG Assessment  Discussed shoulder MRI results and pt has grade 2 tear of L infraspinatus as well as sub-acromial bursitis. Pt in agreement to see ortho PT to work on shoulder rehab. Provided pt w/name of therapist at [facility] and this therapist's email if pt has questions. Pt verbalized understanding and appreciation.    Gait pattern: step through pattern, decreased ankle dorsiflexion- Left, circumduction- Left, lateral hip instability, and lateral lean- Right Distance walked: 120' loop completed 9x =1,080'  Assistive device utilized:  L AFO and None Level of assistance: Modified independence Comments: Pt w/reduced circumduction of LLE this date compared to previous assessment and increased step clearance of LLE w/use of L AFO.    [facility] PT Assessment - 06/26/22 0921       Mini-BESTest   Compensatory Stepping Correction - Forward No step, OR would fall if not caught, OR falls spontaneously.    Compensatory Stepping Correction - Backward Moderate: More than one step is required to recover equilibrium     Compensatory Stepping Correction - Left Lateral Moderate: Several steps to recover equilibrium    Compensatory Stepping Correction - Right Lateral Moderate: Several steps to recover equilibrium    Stepping Corredtion Lateral - lowest score 1            Discussed goal outcomes and informed pt to return if her balance/mobility needs change. Pt verbalized understanding.     PATIENT EDUCATION: Education details: Goal outcomes, plan to transition to ortho PT  Person educated: Patient Education method: Explanation and Handouts Education comprehension: verbalized understanding  HOME EXERCISE  PROGRAM: Access Code: [employer] URL: [email or web address] Date: 04/27/2022 Prepared by: Alethia Berthold [place] Fassnacht  Exercises - Side Step Down with Counter Support  - 1 x daily - 7 x weekly - 3 sets - 10 reps - Forward Step Down with Heel Tap and Rail Support  - 1 x daily - 7 x weekly - 3 sets - 10 reps - Side Stepping with Resistance at Thighs  - 1 x daily - 7 x weekly - 3 sets - 10 reps - Forward Monster Walks  - 1 x daily - 7 x weekly - 3 sets - 10 reps - Backward Monster Walks  - 1 x daily - 7 x weekly - 3 sets - 10 reps - Squat with dumbbells   - 1 x daily - 7 x weekly - 3 sets - 10 reps - Bird Dog  - 1 x daily - 7 x weekly - 3 sets - 10 reps - Child's Pose Stretch  - 1 x daily - 7 x weekly - 3 sets - 10 reps - Tall Kneel Vertical Bridge  - 1 x daily - 7 x weekly - 3 sets - 10 reps - Dead Bug  - 1 x daily - 7 x weekly - 3 sets - 10 reps - Standing Anti-Rotation Press with Anchored Resistance  - 1 x daily - 7 x weekly - 3 sets - 10 reps - Standing march with overhead hold   - 1 x daily - 7 x weekly - 3 sets - 10 reps - Standing in corner on yoga mat with feet together   - 1 x daily - 7 x weekly - 3-4 reps - 30-45 seconds hold - Supine Suboccipital Release with Tennis Balls  - 1 x daily - 7 x weekly - 1 sets - 1 reps - 5-10 minutes hold - Scapular Protraction at Wall  - 1 x daily - 7 x  weekly - 3 sets - 10 reps  Added PVC (can perform with broom or mop) passthroughs and around the worlds on 5/14 (provided word document with pictures)   GOALS: Goals reviewed with patient? Yes  LONG TERM GOALS: Target date: 06/06/2022 (updated to match cert date)   Pt will be independent with final HEP for improved strength, balance, transfers and gait.  Baseline:  Goal status: IN PROGRESS  2.  Pt will improve gait velocity to at least 3.5 ft/s with LRAD for improved gait efficiency and independence  Baseline: 3.36 ft/s with trekking poles, 3.5 ft/sec with L AFO and trekking poles (5/23)  Goal status: MET  3.  Pt will improve MiniBest to 20/28 for decreased fall risk and improvement with compensatory stepping strategies.   Baseline: 14/28 (4/25)  Goal status: REVISED  4.  Pt will ambulate greater than or equal to 1300 feet on with LRAD mod I for improved cardiovascular endurance and BLE strength.   Baseline: 998' w/o AD and SBA Goal status: REVISED  NEW LONG TERM GOALS FOR EXTENDED POC:  Target date: 06/20/2022  Pt will ambulate greater than or equal to 1300 feet on with LRAD mod I for improved cardiovascular endurance and BLE strength.  Baseline: 998' w/o AD and SBA; 1,080' NO AD mod I  Goal status: PARTIALLY MET  2.  Pt will improve MiniBest to 20/28 for decreased fall risk and improvement with compensatory stepping strategies.  Baseline: 14/28 (4/25), only assessed reactive balance on 6/17 Goal status: DC  3.  Pt will be independent with final HEP for  improved strength, balance, transfers and gait. Baseline:  Goal status: MET    ASSESSMENT:  CLINICAL IMPRESSION: Emphasis of skilled PT session on LTG assessment and DC from PT. Pt met 1 LTG and partially met 1 LTG. Pt has improved her distance on w/use of L AFO and no AD, indicative of improved endurance, balance and BLE strength. However, pt did not quite meet her goal distance. Pt has been compliant  w/HEP but continues to be limited w/ADLs due to L shoulder pain. Did not fully assess MiniBest this date due to time constraints, so discontinued that goal, but pt did improve her stepping strategies in posterior and lateral directions compared to initial assessment. Pt requesting to transition to ortho PT to address shoulder pain at this time and is satisfied w/her current functional level. Pt verbalized understanding on how to obtain new referral to neuro PT if mobility needs change.    OBJECTIVE IMPAIRMENTS: Abnormal gait, decreased balance, decreased endurance, decreased mobility, difficulty walking, decreased strength, and impaired sensation  ACTIVITY LIMITATIONS: carrying, bending, squatting, stairs, transfers, bed mobility, locomotion level, and caring for others  PARTICIPATION LIMITATIONS: driving, shopping, community activity, occupation, and yard work  PERSONAL FACTORS: Fitness, Past/current experiences, and 1 comorbidity: RRMS  are also affecting patient's functional outcome.   REHAB POTENTIAL: Good  CLINICAL DECISION MAKING: Evolving/moderate complexity  EVALUATION COMPLEXITY: Moderate  PLAN:  PT FREQUENCY: 2x/week  PT DURATION: 6 weeks  PLANNED INTERVENTIONS: Therapeutic exercises, Therapeutic activity, Neuromuscular re-education, Balance training, Gait training, Patient/Family education, Self Care, Joint mobilization, Stair training, Vestibular training, Canalith repositioning, Orthotic/Fit training, Aquatic Therapy, Dry Needling, Electrical stimulation, Manual therapy, and Re-evaluation    Kaylei Frink E Krosby Ritchie, PT, DPT 06/26/2022, 9:32 AM

## 2022-08-10 HISTORY — PX: LEG SURGERY: SHX1003

## 2022-09-13 ENCOUNTER — Other Ambulatory Visit: Payer: Self-pay

## 2022-09-13 DIAGNOSIS — G35 Multiple sclerosis: Secondary | ICD-10-CM

## 2022-09-13 MED ORDER — OCREVUS 300 MG/10ML IV SOLN
600.0000 mg | INTRAVENOUS | 1 refills | Status: DC
Start: 2022-09-13 — End: 2023-05-23

## 2022-10-17 ENCOUNTER — Telehealth: Payer: Self-pay | Admitting: Neurology

## 2022-10-17 NOTE — Telephone Encounter (Signed)
LVM and sent mychart msg informing pt of need to reschedule 10/19/22 appt- MD out

## 2022-10-19 ENCOUNTER — Ambulatory Visit: Payer: No Typology Code available for payment source | Admitting: Neurology

## 2022-10-26 ENCOUNTER — Telehealth: Payer: Self-pay | Admitting: Neurology

## 2022-10-26 NOTE — Telephone Encounter (Signed)
Geologist, engineering Toni Amend) new prior authorization is needed for ocrelizumab (OCREVUS) 300 MG/10ML injection.

## 2022-10-26 NOTE — Telephone Encounter (Signed)
Gave message to Holly/intrafusion.

## 2022-11-07 ENCOUNTER — Encounter: Payer: Self-pay | Admitting: Neurology

## 2022-11-07 ENCOUNTER — Ambulatory Visit: Payer: No Typology Code available for payment source | Admitting: Neurology

## 2022-11-07 VITALS — BP 133/82 | HR 65 | Ht 66.0 in | Wt 163.0 lb

## 2022-11-07 DIAGNOSIS — G4719 Other hypersomnia: Secondary | ICD-10-CM

## 2022-11-07 DIAGNOSIS — G35 Multiple sclerosis: Secondary | ICD-10-CM

## 2022-11-07 DIAGNOSIS — E559 Vitamin D deficiency, unspecified: Secondary | ICD-10-CM | POA: Diagnosis not present

## 2022-11-07 DIAGNOSIS — Z79899 Other long term (current) drug therapy: Secondary | ICD-10-CM | POA: Diagnosis not present

## 2022-11-07 DIAGNOSIS — R269 Unspecified abnormalities of gait and mobility: Secondary | ICD-10-CM

## 2022-11-07 DIAGNOSIS — M21372 Foot drop, left foot: Secondary | ICD-10-CM

## 2022-11-07 DIAGNOSIS — H532 Diplopia: Secondary | ICD-10-CM

## 2022-11-07 MED ORDER — SOLIFENACIN SUCCINATE 10 MG PO TABS
10.0000 mg | ORAL_TABLET | Freq: Every day | ORAL | 11 refills | Status: DC
Start: 2022-11-07 — End: 2023-12-03

## 2022-11-07 NOTE — Progress Notes (Signed)
[facility]  PATIENT: Samantha Church DOB: 04-Sep-1960  REFERRING DOCTOR OR PCP:  Deatra James SOURCE: Patient and records from [facility] neurology.  _________________________________   HISTORICAL  CHIEF COMPLAINT:  Chief Complaint  Patient presents with   Room 10    Pt is here Alone. Pt states that everything has been okay since her last appointment. Pt states that she doesn't have any new questions or concerns to discuss today.     HISTORY OF PRESENT ILLNESS:  [name] is a 62 year old woman with relapsing remitting multiple sclerosis diagnosed in 2007.    Update 11/07/2022 She had a relapse and went on Ocrevus in 2023.   Next infusion is mid November 2024.   She has tolerated it ok but feels more fatigue.  Balance is poor but she feels she is walking better with the AFO and has gone from 2 sticks to one stick. She has a left foot drop - worse with longer distance..   The  left leg has weakness, spasticity and reduced coordination.   Her left hand is slightly weak but coordinated.  Her right side is strong.   She is walking 1/4.      She uses the bannister on stairs.   She has some spasticity. Tizanidine had not helped.  She has stable bladder urgency.    No recent UTI.   She has mild hesitancy.  Diplopia improved but not resolved.    She has no diplopia in primary gaze but notes on left gaze.   Monocular vision is symmetric   She is very fatigued currenly   We had tried Armodafinil 200 - but made jittery and did not help fatigue.  She has some sleepiness.   Sleep is not restorative so she takes a 2 hour nap with some benefit.  .   She sleeps 8 hours at night and then sleeps 2 hours most afternoons.The Home Sleep test showed AHI = 1.2 (normal).    Mood is doing about the same.    Cognition is mostly fine but she has reduced focus , especially when tired    EPWORTH SLEEPINESS SCALE  On a scale of 0 - 3 what is the chance of dozing:  Sitting and  Reading:   1 --- 2 if no nap Watching TV:    1 --- 2 if no nap Sitting inactive in a public place: 0 Passenger in car for one hour: 3 Lying down to rest in the afternoon: 3 Sitting and talking to someone: 0 Sitting quietly after lunch:  0 In a car, stopped in traffic:  0  Total (out of 24):   9/24 if she takes a scheduled nap.   11/24 if she does not take a nap  MS History:    She was diagnosed in 2007 after presenting with right sided numbness and mild clumsiness / allodynia.    MRI was consistent with MS and she was started with Betaseron.  She then switched to Copaxone but had exacerbations and switched to Tysabri.     She converted to Tysabri but became JCV Ab positive and switched to Gilenya.   Due to exacerbations on Gilenya, she switched back to Tysabri x one year and then switched to Tecfidera (in 2014).   She switched to generic DMF in 2020.   After a relapse with diplopia in 2023 she started Ocrevus.     IMAGING MRI brain 04/08/2019 shows multiple T2/FLAIR hyperintense foci in the hemispheres, brainstem and  cerebellum in a pattern configuration consistent with chronic demyelinating plaque associated with multiple sclerosis.  None of the foci appears to be acute and they do not enhance.  Compared to the MRI dated 12/24/2015, there are no new lesions.  MRI cervical spine 04/08/2019 shows T2 hyperintense foci within the spinal cord anteriorly to the right adjacent to C2, anterolaterally to the right adjacent to C3, laterally to the left adjacent to C3-C4 to C4, laterally to the left at C5, posterolaterally to the right at C6, and to the left at T1..   When compared to the MRI from 04/26/2016, there are no new lesions.  None of the foci enhance.  Mild degenerative disc changes and spondylosis at C3-C4, C4-C5 and C5-C6.  There is no nerve root compression or spinal stenosis.  This appears fairly stable compared to the previous MRI.  MRI thoracic spine 04/08/2019 shows foci within the spinal cord  at T8-T9 and T12 that were also present on the 06/02/2015 MRI.  These are consistent with demyelinating plaque associated with multiple sclerosis.    Mild degenerative changes at T7-T8 and T10-T11 that do not lead to nerve root compression  MRI of the brain 05/04/2021 showed T2/FLAIR hyperintense foci in the brainstem, cerebellum and hemispheres in a pattern consistent with chronic demyelinating plaque associated with multiple sclerosis. None of the foci enhance. However, one focus in the posterior pons on the right is new compared to the 2021 MRI and show subtle restricted diffusion. It is in a location that could cause diplopia    REVIEW OF SYSTEMS: Constitutional: No fevers, chills, sweats, or change in appetite.   She has fatigue Eyes: No visual changes, double vision, eye pain Ear, nose and throat: No hearing loss, ear pain, nasal congestion, sore throat Cardiovascular: No chest pain, palpitations Respiratory:  No shortness of breath at rest or with exertion.   No wheezes GastrointestinaI: No nausea, vomiting, diarrhea, abdominal pain, fecal incontinence Genitourinary:  No dysuria, urinary retention or frequency.  No nocturia. Musculoskeletal:  No neck pain, back pain Integumentary: No rash, pruritus, skin lesions Neurological: as above Psychiatric: No depression at this time.  No anxiety Endocrine: She has hypothyroidism.  No palpitations, diaphoresis, change in appetite, change in weigh or increased thirst Hematologic/Lymphatic:  No anemia, purpura, petechiae. Allergic/Immunologic: No itchy/runny eyes, nasal congestion, recent allergic reactions, rashes  ALLERGIES: No Known Allergies  HOME MEDICATIONS:  Current Outpatient Medications:    Alpha-Lipoic Acid 600 MG TABS, , Disp: , Rfl:    Biotin 100 MG/GM POWD, , Disp: , Rfl:    Cholecalciferol (VITAMIN D-3) 5000 units TABS, Take 1 tablet by mouth daily., Disp: , Rfl:    Cyanocobalamin (B-12) 3000 MCG LOZG, , Disp: , Rfl:     Magnesium (OPTIMAG 125) 125 MG CAPS, Take 2 capsules by mouth daily., Disp: , Rfl:    Metamucil Fiber CHEW, Chew 1 each by mouth daily., Disp: , Rfl:    methylphenidate (RITALIN) 5 MG tablet, Take 1 tablet (5 mg total) by mouth 2 (two) times daily., Disp: 60 tablet, Rfl: 0   Multiple Vitamins-Minerals (MULTIVITAMIN ADULT PO), Take by mouth., Disp: , Rfl:    NP THYROID 60 MG tablet, TAKE 1 TABLET BY MOUTH DAILY BEFORE BREAKFAST, Disp: 90 tablet, Rfl: 3   ocrelizumab (OCREVUS) 300 MG/10ML injection, Inject 20 mLs (600 mg total) into the vein every 6 (six) months., Disp: 20 mL, Rfl: 1   solifenacin (VESICARE) 10 MG tablet, Take 1 tablet (10 mg total) by mouth  daily., Disp: 30 tablet, Rfl: 11  PAST MEDICAL HISTORY: Past Medical History:  Diagnosis Date   Anemia    Hortons syndrome    Hypothyroidism    Dr. Leslie Dales   Lyme disease    Multiple sclerosis (HCC)    Neuromuscular disorder (HCC)    MS   Vision abnormalities     PAST SURGICAL HISTORY: Past Surgical History:  Procedure Laterality Date   AUGMENTATION MAMMAPLASTY Bilateral 2003   saline   BREAST IMPLANT REMOVAL Bilateral 02/17/2019   BREAST SURGERY  2002   COLONOSCOPY  2008   DILATATION & CURRETTAGE/HYSTEROSCOPY WITH RESECTOCOPE N/A 10/09/2012   Procedure: DILATATION & CURETTAGE/HYSTEROSCOPY WITH RESECTOCOPE;  Surgeon: Zelphia Cairo, MD;  Location: [facility];  Service: Gynecology;  Laterality: N/A;  RESECTION with Versapoint    KNEE ARTHROSCOPY  1996   LEG SURGERY  08/2022   UTERINE FIBROID SURGERY  2019   fibroidectomy    FAMILY HISTORY: Family History  Problem Relation Age of Onset   Alzheimer's disease Mother    Stroke Mother    Hypertension Mother    Arthritis Mother    Polycythemia Mother    Colon cancer Father    Thyroid cancer Sister    Dementia Brother        Early   Asthma Brother    Thyroid cancer Brother    Cancer Paternal Aunt        Brain   Cancer Paternal Uncle        Brain   Heart attack  Maternal Grandmother    Hypertension Maternal Grandmother    CAD Maternal Grandmother    Diabetes Maternal Grandfather    Lung cancer Maternal Grandfather    Alzheimer's disease Paternal Grandmother    Hypertension Paternal Grandmother    Hyperlipidemia Paternal Grandmother    Diabetes Paternal Grandfather     SOCIAL HISTORY:  Social History   Socioeconomic History   Marital status: Married    Spouse name: Not on file   Number of children: 4   Years of education: Not on file   Highest education level: Not on file  Occupational History   Occupation: Nutritional therapist    Comment: [employer]  Tobacco Use   Smoking status: Never   Smokeless tobacco: Never  Vaping Use   Vaping status: Never Used  Substance and Sexual Activity   Alcohol use: Yes    Comment: 2x month   Drug use: No   Sexual activity: Yes  Other Topics Concern   Not on file  Social History Narrative   Right Handed   2 Cups of Coffee per day   I/2 Cup of Tea    Social Determinants of Health   Financial Resource Strain: Not on file  Food Insecurity: Not on file  Transportation Needs: Not on file  Physical Activity: Not on file  Stress: Not on file  Social Connections: Not on file  Intimate Partner Violence: Not on file     PHYSICAL EXAM  Vitals:   11/07/22 0927  BP: 133/82  Pulse: 65  Weight: 163 lb (73.9 kg)  Height: 5\' 6"  (1.676 m)     Body mass index is 26.31 kg/m.   General: The patient is well-developed and well-nourished and in no acute distress  Musculoskeletal:  Back is nontender  Neurologic Exam  Mental status: The patient is alert and oriented x 3 at the time of the examination. The patient has apparent normal recent and remote memory, with an apparently  normal attention span and concentration ability.   Speech is normal.  Cranial nerves: Extraocular muscles were intact today.   Either eye can read across the room and colors are symmetric..  Trapezius  and sternocleidomastoid strength is normal. No dysarthria is noted.  Normal facial strength.  No obvious hearing deficits are noted.  Motor:  Muscle bulk is normal.   She has mildly increased muscle tone in the legs, left greater than right.. Strength is  5 / 5 in arms and right leg.   4+ to 5 left leg.  Reduced RAM in left hand but normal strength  Sensory: Sensory testing is intact to touch and vibration in the legs  Coordination: She has good finger-nose-finger bilaterally.  Heel-to-shin is reduced, worse on the left.  Gait and station: Station is normal.  The gait is wide and she has a mild left foot drop.  She is able to walk within the room without the walking stick but cannot do a tandem walk.  Romberg was borderline.    Reflexes: Deep tendon reflexes are brisk bilaterally in the legs.  The deep tendon reflex spreadsat the knee, left greater than right.  No ankle clonus.     DIAGNOSTIC DATA (LABS, IMAGING, TESTING) - I reviewed patient records, labs, notes, testing and imaging myself where available.  Lab Results  Component Value Date   WBC 5.1 04/06/2022   HGB 14.4 04/06/2022   HCT 44.0 04/06/2022   MCV 91 04/06/2022   PLT 337 04/06/2022      Component Value Date/Time   NA 144 04/20/2021 1645   K 4.4 04/20/2021 1645   CL 104 04/20/2021 1645   CO2 23 04/20/2021 1645   GLUCOSE 92 04/20/2021 1645   GLUCOSE 96 12/03/2018 1204   BUN 9 04/20/2021 1645   CREATININE 0.67 04/20/2021 1645   CALCIUM 9.7 04/20/2021 1645   PROT 7.2 04/20/2021 1645   ALBUMIN 4.8 04/20/2021 1645   AST 19 04/20/2021 1645   ALT 17 04/20/2021 1645   ALKPHOS 65 04/20/2021 1645   BILITOT <0.2 04/20/2021 1645   GFRNONAA 104 03/31/2019 1617   GFRAA 120 03/31/2019 1617      ASSESSMENT AND PLAN    1. Multiple sclerosis (HCC)   2. High risk medication use   3. Vitamin D deficiency   4. Gait disorder   5. Left foot drop   6. Excessive daytime sleepiness   7. Diplopia      1.   Continue  Ocrevus.   Check lab work (IgG/IgM, CD20 ) we will recheck MRI in 2025. 2.   Gait improved with the AFO and she now only requires 1 walking stick instead of 2. 3.   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   She was prescribed earlier but opted not to start.. 4.  Vesicare for bladder urgency.     5.   Return in 6 months or sooner based on DMT decision or if there are new or worsening neurologic symptoms or based on the results of the MRI.    Raylyn Carton A. Epimenio Foot, MD, PhD 11/07/2022, 9:52 AM Certified in Neurology, Clinical Neurophysiology, Sleep Medicine, Pain Medicine and Neuroimaging  [facility] [address] [place], [place] [address] [phone]

## 2022-11-09 ENCOUNTER — Encounter: Payer: Self-pay | Admitting: Neurology

## 2022-11-09 LAB — COMPREHENSIVE METABOLIC PANEL
ALT: 30 [IU]/L (ref 0–32)
AST: 23 IU/L (ref 0–40)
Albumin: 4.8 g/dL (ref 3.9–4.9)
Alkaline Phosphatase: 87 [IU]/L (ref 44–121)
BUN/Creatinine Ratio: 18 (ref 12–28)
BUN: 12 mg/dL (ref 8–27)
Bilirubin Total: 0.2 mg/dL (ref 0.0–1.2)
CO2: 21 mmol/L (ref 20–29)
Calcium: 9.6 mg/dL (ref 8.7–10.3)
Chloride: 105 mmol/L (ref 96–106)
Creatinine, Ser: 0.66 mg/dL (ref 0.57–1.00)
Globulin, Total: 2 g/dL (ref 1.5–4.5)
Glucose: 96 mg/dL (ref 70–99)
Potassium: 4.4 mmol/L (ref 3.5–5.2)
Sodium: 142 mmol/L (ref 134–144)
Total Protein: 6.8 g/dL (ref 6.0–8.5)
eGFR: 100 mL/min/{1.73_m2} (ref >59)

## 2022-11-09 LAB — VITAMIN D 25 HYDROXY (VIT D DEFICIENCY, FRACTURES): Vit D, 25-Hydroxy: 70.5 ng/mL (ref 30.0–100.0)

## 2022-11-09 LAB — IGG, IGA, IGM
IgA/Immunoglobulin A, Serum: 61 mg/dL — ABNORMAL LOW (ref 87–352)
IgG (Immunoglobin G), Serum: 757 mg/dL (ref 586–1602)
IgM (Immunoglobulin M), Srm: 62 mg/dL (ref 26–217)

## 2022-11-09 LAB — CD20 B CELLS
% CD19-B Cells: 0 % — ABNORMAL LOW (ref 4.6–22.1)
% CD20-B Cells: 0 % — ABNORMAL LOW (ref 5.0–22.3)

## 2023-02-05 ENCOUNTER — Other Ambulatory Visit: Payer: Self-pay | Admitting: Neurology

## 2023-02-05 ENCOUNTER — Encounter: Payer: Self-pay | Admitting: Neurology

## 2023-02-05 MED ORDER — DALFAMPRIDINE ER 10 MG PO TB12: ORAL_TABLET | ORAL | 11 refills | Status: DC

## 2023-02-08 ENCOUNTER — Encounter: Payer: No Typology Code available for payment source | Admitting: Family Medicine

## 2023-02-27 ENCOUNTER — Encounter: Payer: No Typology Code available for payment source | Admitting: Family Medicine

## 2023-03-07 ENCOUNTER — Encounter: Payer: Self-pay | Admitting: Family Medicine

## 2023-03-07 ENCOUNTER — Ambulatory Visit (INDEPENDENT_AMBULATORY_CARE_PROVIDER_SITE_OTHER): Payer: No Typology Code available for payment source | Admitting: Family Medicine

## 2023-03-07 VITALS — BP 118/76 | HR 66 | Temp 97.7°F | Ht 66.0 in | Wt 160.4 lb

## 2023-03-07 DIAGNOSIS — R140 Abdominal distension (gaseous): Secondary | ICD-10-CM | POA: Insufficient documentation

## 2023-03-07 DIAGNOSIS — E785 Hyperlipidemia, unspecified: Secondary | ICD-10-CM | POA: Diagnosis not present

## 2023-03-07 DIAGNOSIS — Z0000 Encounter for general adult medical examination without abnormal findings: Secondary | ICD-10-CM | POA: Diagnosis not present

## 2023-03-07 DIAGNOSIS — E039 Hypothyroidism, unspecified: Secondary | ICD-10-CM

## 2023-03-07 DIAGNOSIS — G35 Multiple sclerosis: Secondary | ICD-10-CM | POA: Diagnosis not present

## 2023-03-07 LAB — LIPID PANEL
Cholesterol: 202 mg/dL — ABNORMAL HIGH (ref 0–200)
HDL: 55.7 mg/dL (ref >39.00)
LDL Cholesterol: 124 mg/dL — ABNORMAL HIGH (ref 0–99)
NonHDL: 146.22
Total CHOL/HDL Ratio: 4
Triglycerides: 112 mg/dL (ref 0.0–149.0)
VLDL: 22.4 mg/dL (ref 0.0–40.0)

## 2023-03-07 LAB — CBC
HCT: 42.6 % (ref 36.0–46.0)
Hemoglobin: 14.3 g/dL (ref 12.0–15.0)
MCHC: 33.6 g/dL (ref 30.0–36.0)
MCV: 90.5 fL (ref 78.0–100.0)
Platelets: 293 10*3/uL (ref 150.0–400.0)
RBC: 4.7 Mil/uL (ref 3.87–5.11)
RDW: 13.5 % (ref 11.5–15.5)
WBC: 5.3 10*3/uL (ref 4.0–10.5)

## 2023-03-07 LAB — COMPREHENSIVE METABOLIC PANEL
ALT: 22 U/L (ref 0–35)
AST: 20 U/L (ref 0–37)
Albumin: 4.5 g/dL (ref 3.5–5.2)
Alkaline Phosphatase: 64 U/L (ref 39–117)
BUN: 11 mg/dL (ref 6–23)
CO2: 25 meq/L (ref 19–32)
Calcium: 9.2 mg/dL (ref 8.4–10.5)
Chloride: 106 meq/L (ref 96–112)
Creatinine, Ser: 0.69 mg/dL (ref 0.40–1.20)
GFR: 93.29 mL/min (ref >60.00)
Glucose, Bld: 92 mg/dL (ref 70–99)
Potassium: 4 meq/L (ref 3.5–5.1)
Sodium: 141 meq/L (ref 135–145)
Total Bilirubin: 0.4 mg/dL (ref 0.2–1.2)
Total Protein: 7.2 g/dL (ref 6.0–8.3)

## 2023-03-07 LAB — TSH: TSH: 1.24 u[IU]/mL (ref 0.35–5.50)

## 2023-03-07 NOTE — Assessment & Plan Note (Signed)
 Unclear as to the etiology of this. I will check labs to screen for potential causes of RUQ abdominal pain. Virtual colonoscopy in 04/2022 did not show any liver or gallbladder issues. I will refer Samantha Church to GI to consider an EGD. Continue regimen with fiber and magnesium for now.

## 2023-03-07 NOTE — Assessment & Plan Note (Signed)
 I will reassess a TSH today. Continue NP Thyroid (desiccated thyroid) 60 mg daily.

## 2023-03-07 NOTE — Assessment & Plan Note (Signed)
 Improvement in diplopia. Otherwise stable. Continue to follow with neurology.

## 2023-03-07 NOTE — Progress Notes (Signed)
 [facility] Trecia Rogers [facility] [address] [place] [address] Dept: [phone] Dept Fax: [phone]  Annual Physical Visit  Subjective:    Patient ID: Samantha Church, female    DOB: 1960/06/07, 63 y.o..   MRN: [id number]  Chief Complaint  Patient presents with   Annual Exam   History of Present Illness:  Patient is in today for an annual physical/preventative visit.  Ms. Samantha Church has a history of multiple sclerosis. She was diagnosed in 2007 after she had sudden onset of right sided numbness and altered sensation. She sees Dr. Epimenio Foot (neurology). She is managed on ocrelizumab (Ocrevus) infusions every 6 months. She has noted resolution of her diplopia issues.   Ms. Samantha Church has a history of hypothyroidism. She is managed on NP Thyroid (desiccated thyroid) 60 mg daily.    Ms. Samantha Church has a history of borderline hyperlipidemia.  Review of Systems  Constitutional:  Negative for chills, diaphoresis, fever, malaise/fatigue and weight loss.  HENT:  Negative for congestion, ear pain, hearing loss, sinus pain, sore throat and tinnitus.   Eyes:  Negative for blurred vision, pain, discharge and redness.  Respiratory:  Negative for cough, shortness of breath and wheezing.   Cardiovascular:  Negative for chest pain and palpitations.  Gastrointestinal:  Positive for constipation. Negative for abdominal pain, diarrhea, heartburn, nausea and vomiting.       Ms. Samantha Church states she has a hiatal hernia. She has been having issues with bloating and discomfort int he epigastric area. She finds if she does not maintain a regimen of using Metamucil and magnesium daily, she has constipation. She has a history of a tortuous/redundant colon.  Musculoskeletal:  Negative for back pain, joint pain and myalgias.  Skin:  Negative for itching and rash.  Psychiatric/Behavioral:  Negative for depression. The patient is not nervous/anxious.    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/2021   Diplopia 04/20/2021   Balance problem 04/20/2021   Osteopenia 03/31/2021   Borderline hyperlipidemia 01/17/2021   History of Lyme disease 06/22/2020   High risk medication use 07/25/2018   Left leg weakness 10/14/2015   Hypothyroidism (acquired) 11/03/2014   Other fatigue 11/03/2014   Gait disorder 11/03/2014   Multiple sclerosis (HCC) 09/01/2008   Past Surgical History:  Procedure Laterality Date   AUGMENTATION MAMMAPLASTY Bilateral 2003   saline   BREAST IMPLANT REMOVAL Bilateral 02/17/2019   BREAST SURGERY  2002   COLONOSCOPY  2008   DILATATION & CURRETTAGE/HYSTEROSCOPY WITH RESECTOCOPE N/A 10/09/2012   Procedure: DILATATION & CURETTAGE/HYSTEROSCOPY WITH RESECTOCOPE;  Surgeon: Zelphia Cairo, MD;  Location: [facility];  Service: Gynecology;  Laterality: N/A;  RESECTION with Versapoint    KNEE ARTHROSCOPY  1996   LEG SURGERY  08/2022   TUBAL LIGATION  1996   UTERINE FIBROID SURGERY  2019   fibroidectomy   Family History  Problem Relation Age of Onset   Alzheimer's disease Mother    Stroke Mother    Hypertension Mother    Arthritis Mother    Polycythemia Mother    Hearing loss Mother    Varicose Veins Mother    Colon cancer Father    Cancer Father    Thyroid cancer Sister    Cancer Sister    Dementia Brother        Early  Asthma Brother    Thyroid cancer Brother    Arthritis Brother    Cancer Brother    Cancer Paternal Aunt        Brain   Cancer Paternal Uncle        Brain   Heart attack Maternal Grandmother    Hypertension Maternal Grandmother    CAD Maternal Grandmother    Stroke Maternal Grandmother    Diabetes Maternal Grandfather    Lung cancer Maternal Grandfather    Cancer Maternal Grandfather    Alzheimer's disease Paternal Grandmother    Hypertension  Paternal Grandmother    Hyperlipidemia Paternal Grandmother    Diabetes Paternal Grandfather    Cancer Paternal Aunt    Cancer Paternal Uncle    Outpatient Medications Prior to Visit  Medication Sig Dispense Refill   Cholecalciferol (VITAMIN D-3) 5000 units TABS Take 1 tablet by mouth daily.     Cyanocobalamin (B-12) 3000 MCG LOZG      dalfampridine 10 MG TB12 One po q12 hours 60 tablet 11   Magnesium (OPTIMAG 125) 125 MG CAPS Take 2 capsules by mouth daily.     Metamucil Fiber CHEW Chew 1 each by mouth daily.     methylphenidate (RITALIN) 5 MG tablet Take 1 tablet (5 mg total) by mouth 2 (two) times daily. 60 tablet 0   Multiple Vitamins-Minerals (MULTIVITAMIN ADULT PO) Take by mouth.     NP THYROID 60 MG tablet TAKE 1 TABLET BY MOUTH DAILY BEFORE BREAKFAST 90 tablet 3   ocrelizumab (OCREVUS) 300 MG/10ML injection Inject 20 mLs (600 mg total) into the vein every 6 (six) months. 20 mL 1   solifenacin (VESICARE) 10 MG tablet Take 1 tablet (10 mg total) by mouth daily. 30 tablet 11   Alpha-Lipoic Acid 600 MG TABS      Biotin 100 MG/GM POWD      No facility-administered medications prior to visit.   No Known Allergies Objective:   Today's Vitals   03/07/23 0834  BP: 118/76  Pulse: 66  Temp: 97.7 F (36.5 C)  TempSrc: Temporal  SpO2: 98%  Weight: 160 lb 6.4 oz (72.8 kg)  Height: 5\' 6"  (1.676 m)   Body mass index is 25.89 kg/m.   General: Well developed, well nourished. No acute distress. HEENT: Normocephalic, non-traumatic. PERRL, EOMI. Conjunctiva clear. External ears normal. EAC and   TMs normal bilaterally. Nose clear without congestion or rhinorrhea. Mucous membranes moist. Oropharynx   clear. Good dentition. Neck: Supple. No lymphadenopathy. No thyromegaly. Lungs: Clear to auscultation bilaterally. No wheezing, rales or rhonchi. CV: RRR without murmurs or rubs. Pulses 2+ bilaterally. Abdomen: Soft. Bowel sounds positive, normal pitch and frequency. No  hepatosplenomegaly. Mild discomfort   in the epigastrium and RUQ without a clear Murphy's sign. No rebound or guarding. Extremities: Full ROM. No joint swelling or tenderness. No edema noted. Skin: Warm and dry. No rashes. Psych: Alert and oriented. Normal mood and affect.  Health Maintenance Due  Topic Date Due   Zoster Vaccines- Shingrix (1 of 2) Never done   Cervical Cancer Screening (HPV/Pap Cotest)  11/22/2022     Assessment & Plan:   Problem List Items Addressed This Visit       Endocrine   Hypothyroidism (acquired)   I will reassess a TSH today. Continue NP Thyroid (desiccated thyroid) 60 mg daily.      Relevant Orders   TSH     Nervous and Auditory   Multiple sclerosis (HCC)   Improvement in diplopia.  Otherwise stable. Continue to follow with neurology.        Other   Bloating   Unclear as to the etiology of this. I will check labs to screen for potential causes of RUQ abdominal pain. Virtual colonoscopy in 04/2022 did not show any liver or gallbladder issues. I will refer Ms. Aul to GI to consider an EGD. Continue regimen with fiber and magnesium for now.      Relevant Orders   CBC   Comprehensive metabolic panel   Ambulatory referral to Gastroenterology   Borderline hyperlipidemia   I will reassess annual lipids.      Relevant Orders   Lipid panel   Other Visit Diagnoses       Annual physical exam    -  Primary   Overall fair health. Reviewed indicated screenings and immunizations.       Return in about 1 year (around 03/06/2024) for Reassessment.   Loyola Mast, MD

## 2023-03-07 NOTE — Assessment & Plan Note (Signed)
 I will reassess annual lipids.

## 2023-03-09 ENCOUNTER — Telehealth: Payer: Self-pay

## 2023-03-09 NOTE — Telephone Encounter (Signed)
 Patient called to schedule from a referral for bloating. Patient states she had a colonoscopy done last year at [facility]. Records are being sent over for review.

## 2023-03-13 ENCOUNTER — Telehealth: Payer: Self-pay | Admitting: Gastroenterology

## 2023-03-13 NOTE — Telephone Encounter (Signed)
 Okay to book for new patient visit.

## 2023-03-13 NOTE — Telephone Encounter (Signed)
 Good morning Dr. Adela Lank   Supervising Provider AM   We received a call from this patient wishing to schedule an appointment due to bloating. Patient was seen in 04/2022 with [facility] for a virtual colonoscopy. Records are in Epic under Imaging for you to review. Additional records from previous colonoscopies and endoscopies are under the Media tab. Patient is wanting to establish new GI care with our practice. Would you please advise on scheduling?  Thank you.

## 2023-03-15 NOTE — Telephone Encounter (Signed)
Called and left voicemail for patient to schedule appointment.

## 2023-03-21 ENCOUNTER — Encounter: Payer: Self-pay | Admitting: Physician Assistant

## 2023-05-09 NOTE — Progress Notes (Signed)
 Brigitte Canard, PA-C [address] [place], [place]  [address] Phone: [phone]   Gastroenterology Consultation  Referring Provider:     Graig Lawyer, MD Primary Care Physician:  Graig Lawyer, MD Primary Gastroenterologist:  Brigitte Canard, PA-C / Alvester Johnson, MD  Reason for Consultation:     Bloating, constipation, GERD        HPI:   Samantha Church is a 63 y.o. y/o female referred for consultation & management  by Graig Lawyer, MD. Previous patient at [facility].  Transferring care to our office.  Here to evaluate abdominal bloating and constipation.  Takes magnesium and Metamucil daily with benefit.  On this treatment she is having a bowel movement daily.  Previously she had no bowel movement for 1 or 2 weeks.  Constipation has improved.  She denies rectal bleeding.  Main concern is weight gain with abdominal swelling.  She has mild acid reflux.  Mild epigastric discomfort.  She admits to solid food dysphagia.  Has food getting stuck in her esophagus with early satiety.  She feels like she has upper abdominal hernia.  She has tried to lose weight with Mediterranean diet and has not been able to lose weight.  She denies vomiting.  Has some abdominal bloating with gas and belching.  PMH: Multiple sclerosis, hypothyroidism, osteopenia.  03/07/2023 labs: normal CMP, CBC, and TSH.  04/2022: CT Virtual Colonography: Tortuous Colon.  No polyps.  08/2016 last EGD by Dr. Elsie Halo: Mild chronic gastritis.  Biopsies negative for H. pylori and eosinophilic esophagitis.  11/2016: Virtual colonography: Torturous colon.  Moderate retained stool consistent with constipation.  No polyps, masses, or lesions.  07/2011 Colonoscopy by Dr. Elsie Halo: Torturous colon.  Good prep.  No polyps.  Family history significant for father who had colon cancer.  Past Medical History:  Diagnosis Date   Anemia    Arthritis ?   sometimes in fingers   Cataract ?   left eye   GERD (gastroesophageal reflux  disease) ?   hard to swallow/pills stuck in throat   Hortons syndrome    Hypothyroidism    Dr. Christobal Craft   Lyme disease    Multiple sclerosis (HCC)    Neuromuscular disorder (HCC)    MS   Vision abnormalities     Past Surgical History:  Procedure Laterality Date   AUGMENTATION MAMMAPLASTY Bilateral 2003   saline   BREAST IMPLANT REMOVAL Bilateral 02/17/2019   BREAST SURGERY  2002   COLONOSCOPY  2008   DILATATION & CURRETTAGE/HYSTEROSCOPY WITH RESECTOCOPE N/A 10/09/2012   Procedure: DILATATION & CURETTAGE/HYSTEROSCOPY WITH RESECTOCOPE;  Surgeon: Ashby Lawman, MD;  Location: [facility];  Service: Gynecology;  Laterality: N/A;  RESECTION with Versapoint    KNEE ARTHROSCOPY  1996   LEG SURGERY  08/2022   TUBAL LIGATION  1996   UTERINE FIBROID SURGERY  2019   fibroidectomy    Prior to Admission medications   Medication Sig Start Date End Date Taking? Authorizing Provider  Cholecalciferol (VITAMIN D -3) 5000 units TABS Take 1 tablet by mouth daily.    [provider]  Cyanocobalamin (B-12) 3000 MCG LOZG     [provider]  dalfampridine  10 MG TB12 One po q12 hours 02/05/23   Sater, Sherida Dimmer, MD  Magnesium (OPTIMAG 125) 125 MG CAPS Take 2 capsules by mouth daily.    [provider]  Metamucil Fiber CHEW Chew 1 each by mouth daily.    [provider]  methylphenidate  (RITALIN ) 5 MG tablet Take 1 tablet (5 mg total) by mouth 2 (two) times daily. 04/06/22   Sater, Sherida Dimmer, MD  Multiple Vitamins-Minerals (MULTIVITAMIN ADULT PO) Take by mouth.    [provider]  NP THYROID  60 MG tablet TAKE 1 TABLET BY MOUTH DAILY BEFORE BREAKFAST 05/19/22   Emilie Harden, MD  ocrelizumab  (OCREVUS ) 300 MG/10ML injection Inject 20 mLs (600 mg total) into the vein every 6 (six) months. 09/13/22   Sater, Sherida Dimmer, MD  solifenacin  (VESICARE ) 10 MG tablet Take 1 tablet (10 mg total) by mouth daily. 11/07/22   Sater, Sherida Dimmer, MD    Family History  Problem  Relation Age of Onset   Alzheimer's disease Mother    Stroke Mother    Hypertension Mother    Arthritis Mother    Polycythemia Mother    Hearing loss Mother    Varicose Veins Mother    Colon cancer Father    Cancer Father    Thyroid  cancer Sister    Cancer Sister    Dementia Brother        Early   Asthma Brother    Thyroid  cancer Brother    Arthritis Brother    Cancer Brother    Cancer Paternal Aunt        Brain   Cancer Paternal Uncle        Brain   Heart attack Maternal Grandmother    Hypertension Maternal Grandmother    CAD Maternal Grandmother    Stroke Maternal Grandmother    Diabetes Maternal Grandfather    Lung cancer Maternal Grandfather    Cancer Maternal Grandfather    Alzheimer's disease Paternal Grandmother    Hypertension Paternal Grandmother    Hyperlipidemia Paternal Grandmother    Diabetes Paternal Grandfather    Cancer Paternal Aunt    Cancer Paternal Uncle      Social History   Tobacco Use   Smoking status: Never   Smokeless tobacco: Never  Vaping Use   Vaping status: Never Used  Substance Use Topics   Alcohol use: Not Currently    Comment: 2x month   Drug use: No    Allergies as of 05/10/2023   (No Known Allergies)    Review of Systems:    All systems reviewed and negative except where noted in HPI.   Physical Exam:  BP 122/84   Pulse 64   Ht 5\' 6"  (1.676 m)   Wt 154 lb 6.4 oz (70 kg)   LMP 02/10/2015   SpO2 99%   BMI 24.92 kg/m  Patient's last menstrual period was 02/10/2015.  General:   Alert,  Well-developed, well-nourished, pleasant and cooperative in NAD Lungs:  Respirations even and unlabored.  Clear throughout to auscultation.   No wheezes, crackles, or rhonchi. No acute distress. Heart:  Regular rate and rhythm; no murmurs, clicks, rubs, or gallops. Abdomen:  Normal bowel sounds.  No bruits.  Soft, and non-distended without masses, hepatosplenomegaly or hernias noted.  Moderate epigastric Tenderness with soft swollen  lump in the epigastrium.  Moderate RUQ tenderness.  NO lower abdominal tenderness.  No guarding or rebound tenderness.    Neurologic:  Alert and oriented x3;  grossly normal neurologically. Psych:  Alert and cooperative. Normal mood and affect.  Imaging Studies: No results found.  Labs: CBC    Component Value Date/Time   WBC 5.3 03/07/2023 0918   RBC 4.70 03/07/2023 0918   HGB 14.3 03/07/2023 0918   HGB 14.4 04/06/2022 0909  HCT 42.6 03/07/2023 0918   HCT 44.0 04/06/2022 0909   PLT 293.0 03/07/2023 0918   PLT 337 04/06/2022 0909   MCV 90.5 03/07/2023 0918   MCV 91 04/06/2022 0909   MCH 29.9 04/06/2022 0909   MCH 31.4 11/21/2017 1348   MCHC 33.6 03/07/2023 0918   RDW 13.5 03/07/2023 0918   RDW 13.0 04/06/2022 0909   LYMPHSABS 1.4 04/06/2022 0909   MONOABS 0.4 12/03/2018 1204   EOSABS 0.1 04/06/2022 0909   BASOSABS 0.0 04/06/2022 0909    CMP     Component Value Date/Time   NA 141 03/07/2023 0918   NA 142 11/07/2022 0957   K 4.0 03/07/2023 0918   CL 106 03/07/2023 0918   CO2 25 03/07/2023 0918   GLUCOSE 92 03/07/2023 0918   BUN 11 03/07/2023 0918   BUN 12 11/07/2022 0957   CREATININE 0.69 03/07/2023 0918   CALCIUM 9.2 03/07/2023 0918   PROT 7.2 03/07/2023 0918   PROT 6.8 11/07/2022 0957   ALBUMIN 4.5 03/07/2023 0918   ALBUMIN 4.8 11/07/2022 0957   AST 20 03/07/2023 0918   ALT 22 03/07/2023 0918   ALKPHOS 64 03/07/2023 0918   BILITOT 0.4 03/07/2023 0918   BILITOT 0.2 11/07/2022 0957   GFRNONAA 104 03/31/2019 1617   GFRAA 120 03/31/2019 1617    Assessment and Plan:   ADERYN CROYLE is a 63 y.o. y/o female has been referred for   Epigastric pain; Moderate Epig and RUQ tenderness. 2.   GERD / Mild nausea 3.   Dysphagia 4.   Chronic constipation 5.   Weight Gain 6.   Hx Gastritis (H. Pylori Negative) 7.   Family hx colon cancer in Father - Colonoscopy up to date.  Plan: - Abdominal US  Complete - Rx Pantoprazole  40mg  1 tablet once daily. - Barium Swallow  with Tablet - Recommend Lifestyle Modifications to prevent Acid Reflux.  Rec. Avoid coffee, sodas, peppermint, garlic, onions, alcohol, citrus fruits, chocolate, tomatoes, fatty and spicey foods.  Avoid eating 2-3 hours before bedtime.    Follow up 4 weeks with TG  Brigitte Canard, PA-C

## 2023-05-10 ENCOUNTER — Encounter: Payer: Self-pay | Admitting: Physician Assistant

## 2023-05-10 ENCOUNTER — Ambulatory Visit: Admitting: Physician Assistant

## 2023-05-10 VITALS — BP 122/84 | HR 64 | Ht 66.0 in | Wt 154.4 lb

## 2023-05-10 DIAGNOSIS — K219 Gastro-esophageal reflux disease without esophagitis: Secondary | ICD-10-CM

## 2023-05-10 DIAGNOSIS — R1310 Dysphagia, unspecified: Secondary | ICD-10-CM

## 2023-05-10 DIAGNOSIS — R110 Nausea: Secondary | ICD-10-CM

## 2023-05-10 DIAGNOSIS — Z800 Family history of malignant neoplasm of digestive organs: Secondary | ICD-10-CM

## 2023-05-10 DIAGNOSIS — Z8719 Personal history of other diseases of the digestive system: Secondary | ICD-10-CM

## 2023-05-10 DIAGNOSIS — R1013 Epigastric pain: Secondary | ICD-10-CM | POA: Diagnosis not present

## 2023-05-10 DIAGNOSIS — K5909 Other constipation: Secondary | ICD-10-CM

## 2023-05-10 DIAGNOSIS — K2930 Chronic superficial gastritis without bleeding: Secondary | ICD-10-CM

## 2023-05-10 DIAGNOSIS — R635 Abnormal weight gain: Secondary | ICD-10-CM

## 2023-05-10 MED ORDER — PANTOPRAZOLE SODIUM 40 MG PO TBEC: 40.0000 mg | DELAYED_RELEASE_TABLET | Freq: Every day | ORAL | 2 refills | Status: DC

## 2023-05-10 NOTE — Patient Instructions (Signed)
 You have been scheduled for a Barium Esophogram at [facility] (1st floor of the hospital) on 05/30/23 at 9:30 am. Please arrive 30 minutes prior to your appointment for registration. Make certain not to have anything to eat or drink 3 hours prior to your test. If you need to reschedule for any reason, please contact radiology at [phone] to do so. __________________________________________________________________ A barium swallow is an examination that concentrates on views of the esophagus. This tends to be a double contrast exam (barium and two liquids which, when combined, create a gas to distend the wall of the oesophagus) or single contrast (non-ionic iodine based). The study is usually tailored to your symptoms so a good history is essential. Attention is paid during the study to the form, structure and configuration of the esophagus, looking for functional disorders (such as aspiration, dysphagia, achalasia, motility and reflux) EXAMINATION You may be asked to change into a gown, depending on the type of swallow being performed. A radiologist and radiographer will perform the procedure. The radiologist will advise you of the type of contrast selected for your procedure and direct you during the exam. You will be asked to stand, sit or lie in several different positions and to hold a small amount of fluid in your mouth before being asked to swallow while the imaging is performed .In some instances you may be asked to swallow barium coated marshmallows to assess the motility of a solid food bolus. The exam can be recorded as a digital or video fluoroscopy procedure. POST PROCEDURE It will take 1-2 days for the barium to pass through your system. To facilitate this, it is important, unless otherwise directed, to increase your fluids for the next 24-48hrs and to resume your normal diet.  This test typically takes about 30 minutes to  perform. __________________________________________________________________________________  Samantha Church have been scheduled for an abdominal ultrasound at [facility] (1st floor of hospital) on 05/17/23 at 8:30 am. Please arrive 30 minutes prior to your appointment for registration. Make certain not to have anything to eat or drink after midnight. Should you need to reschedule your appointment, please contact radiology at [phone]. This test typically takes about 30 minutes to perform.  We have sent the following medications to your pharmacy for you to pick up at your convenience: Pantoprazole   _______________________________________________________  If your blood pressure at your visit was 140/90 or greater, please contact your primary care physician to follow up on this.  _______________________________________________________  If you are age 62 or older, your body mass index should be between 23-30. Your Body mass index is 24.92 kg/m. If this is out of the aforementioned range listed, please consider follow up with your Primary Care Provider.  If you are age 11 or younger, your body mass index should be between 19-25. Your Body mass index is 24.92 kg/m. If this is out of the aformentioned range listed, please consider follow up with your Primary Care Provider.   ________________________________________________________  The [facility] providers would like to encourage you to use MYCHART to communicate with providers for non-urgent requests or questions.  Due to long hold times on the telephone, sending your provider a message by [facility] may be a faster and more efficient way to get a response.  Please allow 48 business hours for a response.  Please remember that this is for non-urgent requests.  _______________________________________________________

## 2023-05-11 ENCOUNTER — Encounter: Payer: Self-pay | Admitting: Physician Assistant

## 2023-05-12 ENCOUNTER — Encounter: Payer: Self-pay | Admitting: Neurology

## 2023-05-14 ENCOUNTER — Other Ambulatory Visit: Payer: Self-pay | Admitting: Internal Medicine

## 2023-05-14 ENCOUNTER — Encounter: Payer: Self-pay | Admitting: Physician Assistant

## 2023-05-14 NOTE — Progress Notes (Signed)
 Agree with assessment and plan as outlined.

## 2023-05-14 NOTE — Addendum Note (Signed)
 Addended by: Jazz Rogala B on: 05/14/2023 04:13 PM   Modules accepted: Orders

## 2023-05-17 ENCOUNTER — Ambulatory Visit (HOSPITAL_COMMUNITY)

## 2023-05-17 ENCOUNTER — Ambulatory Visit
Admission: RE | Admit: 2023-05-17 | Discharge: 2023-05-17 | Disposition: A | Discharge status: Home / Self Care | Source: Ambulatory Visit | Attending: Physician Assistant | Admitting: Physician Assistant

## 2023-05-17 ENCOUNTER — Other Ambulatory Visit: Payer: Self-pay | Admitting: *Deleted

## 2023-05-17 ENCOUNTER — Telehealth: Payer: Self-pay

## 2023-05-17 DIAGNOSIS — R1013 Epigastric pain: Secondary | ICD-10-CM

## 2023-05-17 DIAGNOSIS — R1310 Dysphagia, unspecified: Secondary | ICD-10-CM

## 2023-05-17 DIAGNOSIS — R1011 Right upper quadrant pain: Secondary | ICD-10-CM

## 2023-05-17 DIAGNOSIS — R9389 Abnormal findings on diagnostic imaging of other specified body structures: Secondary | ICD-10-CM

## 2023-05-17 DIAGNOSIS — R110 Nausea: Secondary | ICD-10-CM

## 2023-05-17 NOTE — Telephone Encounter (Signed)
 Inbound call from patient, returning dottie's call.

## 2023-05-17 NOTE — Telephone Encounter (Signed)
 See result note.

## 2023-05-17 NOTE — Progress Notes (Signed)
 Call and notify patient abdominal ultrasound showed gallbladder sludge, positive Murphy sign, possible cholecystitis, possible early signs of liver cirrhosis.  I recommend: 1.  Stat labs CBC, CMP, lipase.  Diagnosis RUQ pain and abnormal ultrasound. 2.  Order abdominal MRI/MRCP to further evaluate gallbladder and liver. 3.  If patient is having a lot of RUQ pain with nausea, vomiting, fever, or chills, then she should go to the ED for further evaluation of acute cholecystitis.  Brigitte Canard, PA-C

## 2023-05-17 NOTE — Progress Notes (Signed)
 Call and notify patient barium swallow test is normal.  No evidence of esophageal masses or strictures.  See abdominal ultrasound results for other recommendations. Brigitte Canard, PA-C

## 2023-05-17 NOTE — Telephone Encounter (Signed)
 Received call report on US . See below:  IMPRESSION: 1. Positive sonographic Murphy sign with trace pericholecystic fluid and gallbladder wall thickening. Sludge noted in the gallbladder. Findings are concerning for acute cholecystitis. 2. Coarsened echotexture of the liver. This is nonspecific but can be seen in cirrhosis.

## 2023-05-18 ENCOUNTER — Telehealth: Payer: Self-pay | Admitting: Physician Assistant

## 2023-05-18 ENCOUNTER — Other Ambulatory Visit (INDEPENDENT_AMBULATORY_CARE_PROVIDER_SITE_OTHER)

## 2023-05-18 DIAGNOSIS — R1011 Right upper quadrant pain: Secondary | ICD-10-CM | POA: Diagnosis not present

## 2023-05-18 DIAGNOSIS — R9389 Abnormal findings on diagnostic imaging of other specified body structures: Secondary | ICD-10-CM

## 2023-05-18 LAB — CBC WITH DIFFERENTIAL/PLATELET
Basophils Absolute: 0 10*3/uL (ref 0.0–0.1)
Basophils Relative: 0.5 % (ref 0.0–3.0)
Eosinophils Absolute: 0.1 10*3/uL (ref 0.0–0.7)
Eosinophils Relative: 1.2 % (ref 0.0–5.0)
HCT: 43.5 % (ref 36.0–46.0)
Hemoglobin: 14.6 g/dL (ref 12.0–15.0)
Lymphocytes Relative: 24.3 % (ref 12.0–46.0)
Lymphs Abs: 1.8 10*3/uL (ref 0.7–4.0)
MCHC: 33.5 g/dL (ref 30.0–36.0)
MCV: 89.5 fl (ref 78.0–100.0)
Monocytes Absolute: 0.5 10*3/uL (ref 0.1–1.0)
Monocytes Relative: 7.3 % (ref 3.0–12.0)
Neutro Abs: 5 10*3/uL (ref 1.4–7.7)
Neutrophils Relative %: 66.7 % (ref 43.0–77.0)
Platelets: 303 10*3/uL (ref 150.0–400.0)
RBC: 4.86 Mil/uL (ref 3.87–5.11)
RDW: 13.8 % (ref 11.5–15.5)
WBC: 7.4 10*3/uL (ref 4.0–10.5)

## 2023-05-18 LAB — COMPREHENSIVE METABOLIC PANEL WITH GFR
ALT: 22 U/L (ref 0–35)
AST: 19 U/L (ref 0–37)
Albumin: 4.8 g/dL (ref 3.5–5.2)
Alkaline Phosphatase: 63 U/L (ref 39–117)
BUN: 12 mg/dL (ref 6–23)
CO2: 28 meq/L (ref 19–32)
Calcium: 9.3 mg/dL (ref 8.4–10.5)
Chloride: 105 meq/L (ref 96–112)
Creatinine, Ser: 0.67 mg/dL (ref 0.40–1.20)
GFR: 93.83 mL/min (ref >60.00)
Glucose, Bld: 111 mg/dL — ABNORMAL HIGH (ref 70–99)
Potassium: 4 meq/L (ref 3.5–5.1)
Sodium: 142 meq/L (ref 135–145)
Total Bilirubin: 0.3 mg/dL (ref 0.2–1.2)
Total Protein: 7.4 g/dL (ref 6.0–8.3)

## 2023-05-18 LAB — LIPASE: Lipase: 33 U/L (ref 11.0–59.0)

## 2023-05-18 NOTE — Telephone Encounter (Signed)
 See 05/17/23 imaging results notes for additional information.

## 2023-05-18 NOTE — Telephone Encounter (Signed)
 Inbound call from [facility] requesting to have MRI order location changed to [facility]. Call back number is [phone]. Please advise, thank you.

## 2023-05-18 NOTE — Telephone Encounter (Signed)
 MRI order states preferred location [facility] location.

## 2023-05-18 NOTE — Telephone Encounter (Signed)
 Inbound call from patient, wishing to speak to [facility], states if you could please reach her before noon due to being out of reach after then.

## 2023-05-21 ENCOUNTER — Encounter: Payer: Self-pay | Admitting: Internal Medicine

## 2023-05-21 ENCOUNTER — Ambulatory Visit (INDEPENDENT_AMBULATORY_CARE_PROVIDER_SITE_OTHER): Payer: No Typology Code available for payment source | Admitting: Internal Medicine

## 2023-05-21 VITALS — BP 120/70 | HR 64 | Ht 66.0 in | Wt 155.8 lb

## 2023-05-21 DIAGNOSIS — E039 Hypothyroidism, unspecified: Secondary | ICD-10-CM

## 2023-05-21 DIAGNOSIS — R1310 Dysphagia, unspecified: Secondary | ICD-10-CM

## 2023-05-21 LAB — TSH: TSH: 1.15 m[IU]/L (ref 0.40–4.50)

## 2023-05-21 LAB — T4, FREE: Free T4: 1.2 ng/dL (ref 0.8–1.8)

## 2023-05-21 LAB — T3, FREE: T3, Free: 4.1 pg/mL (ref 2.3–4.2)

## 2023-05-21 NOTE — Progress Notes (Addendum)
 Patient ID: Samantha Church, female   DOB: November 20, 1960, 63 y.o.   MRN: [id number]   HPI  Samantha Church is a 63 y.o.-year-old female, presenting for follow-up for hypothyroidism.  Last visit 1 year ago.  Interim history: She denies weakness, falls. She came off high-dose biotin before last visit. She has chronic fatigue. She had OSA testing - normal sleep study.  She does not report insomnia. She does mention increased midsection weight. She was found to have cholecystitis and liver inflammation >> MRI pending. She does have dysphagia, along with fullness-recently started Protonix .  Reviewed history: Pt. has been dx with hypothyroidism in 2013 >> she was initially on Synthroid, then changed to NP thyroid  at [facility] ([facility]).    She takes NP thyroid  60 mg daily: - in am - fasting - + Coffee (black) at least 30 minutes later - at least 30 min from b'fast - no Ca, Fe - + MVI 4 >> 2 hours after thyroid  hormones - + PPIs - started 2 weeks ago  - in the evening - on Biotin very high dose (100,000 mcg 1-2x daily >> then 1x a day >> now off x 3 weeks) - for MS  On vitamin C, D, Mg, , Metamucil, ALA (also for MS), , . On Ocrevus  q 6 mo (started 02/2021) - every 6 months.   Reviewed her TFTs: Lab Results  Component Value Date   TSH 1.24 03/07/2023   TSH 0.82 01/25/2022   TSH 1.00 04/18/2021   TSH 1.35 04/15/2020   TSH 0.98 04/16/2019   TSH 0.76 12/03/2018   TSH 0.77 07/29/2018   TSH 0.70 05/07/2017   TSH 0.75 11/07/2016   TSH 0.88 06/27/2016   FREET4 0.79 04/15/2020   FREET4 5.19 (H) 04/16/2019   FREET4 1.39 12/03/2018   FREET4 1.1 07/29/2018   FREET4 0.78 05/07/2017   FREET4 0.85 11/07/2016   Lab Results  Component Value Date   T3FREE 3.2 04/15/2020   T3FREE 18.2 (H) 04/16/2019   T3FREE 4.1 07/29/2018   T3FREE 4.3 (H) 05/07/2017   T3FREE 4.1 11/07/2016   Investigation for Hashimoto's thyroiditis was negative: Component     Latest Ref Rng & Units 11/07/2016   Thyroglobulin Ab     < or = 1 IU/mL <1  Thyroperoxidase Ab SerPl-aCnc     <9 IU/mL <1   Pt denies: - feeling nodules in neck - hoarseness - choking She does have dysphagia + stomach fullness.  She has increased fatigue - on B12 supplements as she was told that Ocrevus  can decrease her vitamin B12 level.  Thyroid  U.S (05/07/2007) report reviewed: Normal  No family history of hypo or hyperthyroidism but + FH of thyroid  cancer in brother and sister: PTC. No h/o radiation tx to head or neck. No new herbal supplements.  No steroid use.  She got her breast implants removed 02/2019.  She [facility] for L[facility] ds.  Of note, liver tests became elevated >> stopped the ABx for Lyme ds.  LFTs normalized: Lab Results  Component Value Date   ALT 22 05/18/2023   AST 19 05/18/2023   ALKPHOS 63 05/18/2023   BILITOT 0.3 05/18/2023   ROS:+ see HPI  I reviewed pt's medications, allergies, PMH, social hx, family hx, and changes were documented in the history of present illness. Otherwise, unchanged from my initial visit note.  Past Medical History:  Diagnosis Date   Anemia    Arthritis ?   sometimes  in fingers   Cataract ?   left eye   GERD (gastroesophageal reflux disease) ?   hard to swallow/pills stuck in throat   Hortons syndrome    Hypothyroidism    Dr. Christobal Craft   Lyme disease    Multiple sclerosis (HCC)    Neuromuscular disorder (HCC)    MS   Vision abnormalities    Past Surgical History:  Procedure Laterality Date   AUGMENTATION MAMMAPLASTY Bilateral 2003   saline   BREAST IMPLANT REMOVAL Bilateral 02/17/2019   BREAST SURGERY  2002   COLONOSCOPY  2008   DILATATION & CURRETTAGE/HYSTEROSCOPY WITH RESECTOCOPE N/A 10/09/2012   Procedure: DILATATION & CURETTAGE/HYSTEROSCOPY WITH RESECTOCOPE;  Surgeon: Ashby Lawman, MD;  Location: [facility];  Service: Gynecology;  Laterality: N/A;  RESECTION with Versapoint    KNEE ARTHROSCOPY  1996    LEG SURGERY  08/2022   TUBAL LIGATION  1996   UTERINE FIBROID SURGERY  2019   fibroidectomy   Social History   Social History   Marital status: Married    Spouse name: N/A   Number of children: 4   Occupational History   Accounting   Social History Main Topics   Smoking status: Never Smoker   Smokeless tobacco: Never Used   Alcohol use Yes     Comment: wine, nightly   Drug use: No   Current Outpatient Medications on File Prior to Visit  Medication Sig Dispense Refill   Cholecalciferol (VITAMIN D -3) 5000 units TABS Take 1 tablet by mouth daily. (Patient not taking: Reported on 05/10/2023)     Cyanocobalamin (B-12) 3000 MCG LOZG      dalfampridine  10 MG TB12 One po q12 hours 60 tablet 11   Magnesium (OPTIMAG 125) 125 MG CAPS Take 2 capsules by mouth daily.     Metamucil Fiber CHEW Chew 1 each by mouth daily.     methylphenidate  (RITALIN ) 5 MG tablet Take 1 tablet (5 mg total) by mouth 2 (two) times daily. (Patient not taking: Reported on 05/10/2023) 60 tablet 0   Multiple Vitamin (MULTIVITAMIN) tablet Take 1 tablet by mouth daily.     Multiple Vitamins-Minerals (MULTIVITAMIN ADULT PO) Take by mouth.     NP THYROID  60 MG tablet TAKE 1 TABLET BY MOUTH DAILY BEFORE BREAKFAST 90 tablet 3   ocrelizumab  (OCREVUS ) 300 MG/10ML injection Inject 20 mLs (600 mg total) into the vein every 6 (six) months. 20 mL 1   pantoprazole  (PROTONIX ) 40 MG tablet Take 1 tablet (40 mg total) by mouth daily. 30 tablet 2   solifenacin  (VESICARE ) 10 MG tablet Take 1 tablet (10 mg total) by mouth daily. 30 tablet 11   No current facility-administered medications on file prior to visit.   No Known Allergies Family History  Problem Relation Age of Onset   Alzheimer's disease Mother    Stroke Mother    Hypertension Mother    Arthritis Mother    Polycythemia Mother    Hearing loss Mother    Varicose Veins Mother    Colon cancer Father    Cancer Father    Thyroid  cancer Sister    Cancer Sister     Dementia Brother        Early   Asthma Brother    Thyroid  cancer Brother    Arthritis Brother    Cancer Brother    Cancer Paternal Aunt        Brain   Cancer Paternal Uncle        Brain  Heart attack Maternal Grandmother    Hypertension Maternal Grandmother    CAD Maternal Grandmother    Stroke Maternal Grandmother    Diabetes Maternal Grandfather    Lung cancer Maternal Grandfather    Cancer Maternal Grandfather    Alzheimer's disease Paternal Grandmother    Hypertension Paternal Grandmother    Hyperlipidemia Paternal Grandmother    Diabetes Paternal Grandfather    Cancer Paternal Aunt    Cancer Paternal Uncle    Also, DM in PGF; HL, heart ds in M.  PE: BP 120/70   Pulse 64   Ht 5\' 6"  (1.676 m)   Wt 155 lb 12.8 oz (70.7 kg)   LMP 02/10/2015   SpO2 98%   BMI 25.15 kg/m  Wt Readings from Last 3 Encounters:  05/21/23 155 lb 12.8 oz (70.7 kg)  05/10/23 154 lb 6.4 oz (70 kg)  03/07/23 160 lb 6.4 oz (72.8 kg)   Constitutional: normal weight, in NAD, walks with 2 canes Eyes:  EOMI, no exophthalmos ENT: no neck masses, no cervical lymphadenopathy Cardiovascular: RRR, No MRG Respiratory: CTA B Musculoskeletal: no deformities Skin:no rashes Neurological: no tremor with outstretched hands  ASSESSMENT: 1. Acquired Hypothyroidism  2.  Dysphagia  PLAN:  1. Patient with longstanding, non-autoimmune hypothyroidism, on desiccated thyroid  extract - latest thyroid  labs reviewed with pt. >> normal: Lab Results  Component Value Date   TSH 1.24 03/07/2023  - she continues on NP thyroid  60 mg daily - pt feels good on this dose.  She continues to have chronic fatigue which she attributes to Ocrevus . - we discussed about taking the thyroid  hormone every day, with water, >30 minutes before breakfast, separated by >4 hours from acid reflux medications, calcium, iron, multivitamins.  She was taking it correctly at last visit, but since then, she moved her multivitamins only to  hours after the thyroid  medication and we discussed about moving it later in the day, at least 4 hours apart.  She did start Protonix  since last visit but she is taking this at night. - she was on 100,000 units of biotin for MS.  She was usually stopping the supplement 4 weeks prior to labs.  She came off biotin since. - will check thyroid  tests today: TSH, fT3,and fT4 - If labs are abnormal, she will need to return for repeat TFTs in 1.5 months - I will see her back in a year  2.  Dysphagia - Intermittent, associated with upper abdominal fullness - She recently had a barium swallow and this was normal.  She had a liver ultrasound that showed acute cholecystitis and also liver inflammation,?  Cirrhosis.  She has an abdominal MRI coming up.  She was started on Protonix  2 weeks ago.  The dysphagia improved. - We discussed that this is unlikely to be caused by thyroid  compression.  Of note, a thyroid  ultrasound from 2009 was normal, not showing any nodules.  Component     Latest Ref Rng 05/21/2023  Triiodothyronine,Free,Serum     2.3 - 4.2 pg/mL 4.1   T4,Free(Direct)     0.8 - 1.8 ng/dL 1.2   TSH     7.82 - 9.56 mIU/L 1.15   Normal.  Emilie Harden, MD PhD [facility]

## 2023-05-21 NOTE — Patient Instructions (Signed)
Please continue NP thyroid 60 mg daily. ? ?Take the thyroid hormone every day, with water, at least 30 minutes before breakfast, separated by at least 4 hours from: ?- acid reflux medications ?- calcium ?- iron ?- multivitamins ? ?Please come back for a follow-up appointment in 1 year. ? ?

## 2023-05-22 ENCOUNTER — Telehealth: Payer: Self-pay | Admitting: Neurology

## 2023-05-22 NOTE — Telephone Encounter (Signed)
 Giselle from [facility] called in regards to getting Pt medication being Delivered . Giselle informed  Pt medication will not make to office until 05-15 .PT appt is 5-15 at 11 :00 .She is not aware of the time  of delivery. [facility]  want to know can they still send out medication . If  not can you give callback [phone]   ocrelizumab  (OCREVUS ) 300 MG/10ML injection

## 2023-05-22 NOTE — Telephone Encounter (Signed)
 Pt infuses with [facility] here at [facility]. I forwarded message to them to f/u on.

## 2023-05-22 NOTE — Telephone Encounter (Signed)
 Per [place], pt appt 05/28/23

## 2023-05-23 ENCOUNTER — Other Ambulatory Visit: Payer: Self-pay | Admitting: Neurology

## 2023-05-23 ENCOUNTER — Ambulatory Visit: Payer: Self-pay | Admitting: Physician Assistant

## 2023-05-23 ENCOUNTER — Ambulatory Visit
Admission: RE | Admit: 2023-05-23 | Discharge: 2023-05-23 | Disposition: A | Discharge status: Home / Self Care | Source: Ambulatory Visit | Attending: Physician Assistant | Admitting: Physician Assistant

## 2023-05-23 DIAGNOSIS — R1011 Right upper quadrant pain: Secondary | ICD-10-CM

## 2023-05-23 DIAGNOSIS — K581 Irritable bowel syndrome with constipation: Secondary | ICD-10-CM

## 2023-05-23 DIAGNOSIS — G35 Multiple sclerosis: Secondary | ICD-10-CM

## 2023-05-23 DIAGNOSIS — K5904 Chronic idiopathic constipation: Secondary | ICD-10-CM

## 2023-05-23 DIAGNOSIS — R9389 Abnormal findings on diagnostic imaging of other specified body structures: Secondary | ICD-10-CM

## 2023-05-23 MED ORDER — GADOPICLENOL 0.5 MMOL/ML IV SOLN
7.5000 mL | Freq: Once | INTRAVENOUS | Status: AC | PRN
  Administered 2023-05-23: 7.5 mL via INTRAVENOUS

## 2023-05-23 MED ORDER — LINACLOTIDE 145 MCG PO CAPS: 145.0000 ug | ORAL_CAPSULE | Freq: Every day | ORAL | 5 refills | Status: DC

## 2023-05-23 NOTE — Telephone Encounter (Signed)
 Last seen on 11/07/22 Follow up scheduled 05/24/23

## 2023-05-24 ENCOUNTER — Ambulatory Visit: Payer: No Typology Code available for payment source | Admitting: Neurology

## 2023-05-30 ENCOUNTER — Other Ambulatory Visit (HOSPITAL_COMMUNITY)

## 2023-05-31 ENCOUNTER — Telehealth: Payer: Self-pay

## 2023-05-31 DIAGNOSIS — K5904 Chronic idiopathic constipation: Secondary | ICD-10-CM

## 2023-05-31 DIAGNOSIS — K581 Irritable bowel syndrome with constipation: Secondary | ICD-10-CM

## 2023-05-31 MED ORDER — LINACLOTIDE 145 MCG PO CAPS: 145.0000 ug | ORAL_CAPSULE | Freq: Every day | ORAL | 5 refills | Status: DC

## 2023-05-31 NOTE — Addendum Note (Signed)
 Addended by: Alissa Pharr M on: 05/31/2023 04:42 PM   Modules accepted: Orders

## 2023-05-31 NOTE — Telephone Encounter (Signed)
 PT returned call to verify were her prescriptions need to go. She stated that only the Linzess  should be sent to her via [facility] delivery. Everything else should be sent [facility].

## 2023-05-31 NOTE — Telephone Encounter (Signed)
 Rec'd fax from [facility] saying that patient's Linzess  needs to be sent to [facility] and the script has to be written by a DO or MD. Called and left message for patient that if she would like her prescriptions sent elsewhere than the pharmacy we have in her chart [facility]) to just let us  know. Of course, all of our providers are licensed to write prescriptions.

## 2023-05-31 NOTE — Telephone Encounter (Signed)
 Linzess  prescription sent to [facility] delivery

## 2023-06-06 NOTE — Progress Notes (Unsigned)
 Samantha Canard, PA-C [address] [place], [place]  [address] Phone: [phone]   Primary Care Physician: Graig Lawyer, MD  Primary Gastroenterologist:  Samantha Canard, PA-C / Alvester Johnson, MD   Chief Complaint:  F/U GERD, Dysphagia, Constipation       HPI:   Samantha Church is a 63 y.o. female returns for 1 month follow-up of abdominal pain, GERD, dysphagia, and chronic constipation.  For the past month she has been taking pantoprazole  40 Mg 1 tablet once daily.  Was also started on Linzess  145 mcg 1 tablet daily.  She is currently feeling a lot better.  She is having 1 bowel movement every morning.  She also takes magnesium and Metamucil once daily.  She still has some abdominal bloating in her mid upper abdomen.  Abdominal pain has resolved.  On pantoprazole  she has no more heartburn, acid reflux, or dysphagia symptoms.  She is feeling a lot better.  05/18/2023 labs: Normal CBC, CMP, and lipase.  05/17/2023 complete abdominal ultrasound: Coarse echotexture of the liver, nonspecific.  Positive Murphy sign with trace pericholecystic fluid and gallbladder wall thickening.  Gallbladder sludge.  05/17/2023 barium swallow tablet: Normal.  No hiatal hernia, GERD, or stricture.  05/23/2023 abdominal MRI/MRCP: Prominent stool throughout the colon consistent with constipation.  Normal gallbladder and bile duct.  Small hepatic hemangiomas.  Small umbilical and supraumbilical hernias containing fat.  No acute abnormality.  PMH: Multiple sclerosis, hypothyroidism, osteopenia.   03/07/2023 labs: normal CMP, CBC, and TSH.   04/2022: CT Virtual Colonography: Tortuous Colon.  No polyps.   08/2016 last EGD by Dr. Elsie Halo: Mild chronic gastritis.  Biopsies negative for H. pylori and eosinophilic esophagitis.   11/2016: Virtual colonography: Torturous colon.  Moderate retained stool consistent with constipation.  No polyps, masses, or lesions.   07/2011 Colonoscopy by Dr. Elsie Halo: Torturous colon.   Good prep.  No polyps.   Family history significant for father who had colon cancer.  Current Outpatient Medications  Medication Sig Dispense Refill   Cholecalciferol (VITAMIN D -3) 5000 units TABS Take 1 tablet by mouth daily.     Cyanocobalamin (B-12) 3000 MCG LOZG      dalfampridine  10 MG TB12 One po q12 hours 60 tablet 11   linaclotide  (LINZESS ) 145 MCG CAPS capsule Take 1 capsule (145 mcg total) by mouth daily before breakfast. 30 capsule 5   Magnesium (OPTIMAG 125) 125 MG CAPS Take 2 capsules by mouth daily.     Metamucil Fiber CHEW Chew 1 each by mouth daily.     methylphenidate  (RITALIN ) 5 MG tablet Take 1 tablet (5 mg total) by mouth 2 (two) times daily. 60 tablet 0   Multiple Vitamin (MULTIVITAMIN) tablet Take 1 tablet by mouth daily.     Multiple Vitamins-Minerals (MULTIVITAMIN ADULT PO) Take by mouth.     NP THYROID  60 MG tablet TAKE 1 TABLET BY MOUTH DAILY BEFORE BREAKFAST 90 tablet 3   ocrelizumab  (OCREVUS ) 300 MG/10ML injection INFUSE 600MG  ( ) INTRAVENOUSLY EVERY 6 MONTHS 20 mL 1   pantoprazole  (PROTONIX ) 40 MG tablet Take 1 tablet (40 mg total) by mouth daily. 30 tablet 2   solifenacin  (VESICARE ) 10 MG tablet Take 1 tablet (10 mg total) by mouth daily. 30 tablet 11   No current facility-administered medications for this visit.    Allergies as of 06/07/2023   (No Known Allergies)    Past Medical History:  Diagnosis Date   Anemia    Arthritis ?  sometimes in fingers   Cataract ?   left eye   GERD (gastroesophageal reflux disease) ?   hard to swallow/pills stuck in throat   Hortons syndrome    Hypothyroidism    Dr. Christobal Craft   Lyme disease    Multiple sclerosis (HCC)    Neuromuscular disorder (HCC)    MS   Vision abnormalities     Past Surgical History:  Procedure Laterality Date   AUGMENTATION MAMMAPLASTY Bilateral 2003   saline   BREAST IMPLANT REMOVAL Bilateral 02/17/2019   BREAST SURGERY  2002   COLONOSCOPY  2008   DILATATION &  CURRETTAGE/HYSTEROSCOPY WITH RESECTOCOPE N/A 10/09/2012   Procedure: DILATATION & CURETTAGE/HYSTEROSCOPY WITH RESECTOCOPE;  Surgeon: Ashby Lawman, MD;  Location: [facility];  Service: Gynecology;  Laterality: N/A;  RESECTION with Versapoint    KNEE ARTHROSCOPY  1996   LEG SURGERY  08/2022   TUBAL LIGATION  1996   UTERINE FIBROID SURGERY  2019   fibroidectomy    Review of Systems:    All systems reviewed and negative except where noted in HPI.    Physical Exam:  BP 108/60 (BP Location: Left Arm, Patient Position: Sitting, Cuff Size: Normal)   Pulse 66   Ht 5\' 6"  (1.676 m)   Wt 156 lb 2 oz (70.8 kg)   LMP 02/10/2015   BMI 25.20 kg/m  Patient's last menstrual period was 02/10/2015.  General: Well-nourished, well-developed in no acute distress.  Lungs: Clear to auscultation bilaterally. Non-labored. Heart: Regular rate and rhythm, no murmurs rubs or gallops.  Abdomen: Bowel sounds are normal; Abdomen is Soft; No hepatosplenomegaly, masses; there is a small supraumbilical hernia containing fat which spontaneously reduces.  Small reducible umbilical hernia.  No Abdominal Tenderness; No guarding or rebound tenderness. Neuro: Alert and oriented x 3.  Grossly intact.  Psych: Alert and cooperative, normal mood and affect.   Imaging Studies: MR ABDOMEN MRCP W WO CONTAST Result Date: 05/23/2023 CLINICAL DATA:  Right upper quadrant abdominal pain EXAM: MRI ABDOMEN WITHOUT AND WITH CONTRAST (INCLUDING MRCP) TECHNIQUE: Multiplanar multisequence MR imaging of the abdomen was performed both before and after the administration of intravenous contrast. Heavily T2-weighted images of the biliary and pancreatic ducts were obtained, and three-dimensional MRCP images were rendered by post processing. CONTRAST:  7 cc Vueway  COMPARISON:  Abdominal ultrasound 05/17/2023 FINDINGS: Lower chest: Unremarkable Hepatobiliary: 1.0 cm T2 hyperintense lesion in segment 7 of the liver on image 1 series 7 demonstrates  peripheral nodular progressive enhancement suspicious for a small hemangioma. Similarly a 5 by 3 mm T2 hyperintense lesion inferiorly in the right hepatic lobe on image 17 series 7 demonstrates loss of conspicuity on delayed images favoring a tiny hemangioma. Gallbladder unremarkable. No biliary dilatation or definite filling defect in the biliary tree. Pancreas:  Unremarkable Spleen:  Unremarkable Adrenals/Urinary Tract:  Unremarkable Stomach/Bowel: Prominent stool throughout the colon favors constipation. Vascular/Lymphatic:  Unremarkable Other:  No supplemental non-categorized findings. Musculoskeletal: Small umbilical and supraumbilical hernias contain adipose tissue. Mild lumbar spondylosis and degenerative disc disease. IMPRESSION: 1. Prominent stool throughout the colon favors constipation. 2. Unremarkable biliary tree. 3. Small umbilical and supraumbilical hernias contain adipose tissue. 4. Mild lumbar spondylosis and degenerative disc disease. 5. Small hepatic hemangiomas. Electronically Signed   By: Freida Jes M.D.   On: 05/23/2023 13:40   DG ESOPHAGUS W DOUBLE CM (HD) Result Date: 05/17/2023 CLINICAL DATA:  Dysphagia. EXAM: ESOPHOGRAM / BARIUM SWALLOW / BARIUM TABLET STUDY TECHNIQUE: Combined double contrast and single contrast examination performed using  effervescent crystals, thick barium liquid, and thin barium liquid. The patient was observed with fluoroscopy swallowing a 13 mm barium sulphate tablet. FLUOROSCOPY: Radiation Exposure Index (as provided by the fluoroscopic device): 5.6 mGy Kerma COMPARISON:  July 25, 2016. FINDINGS: No definite mass or stricture is noted in the esophagus. No hiatal hernia or reflux is noted. Barium tablet passed through esophagus and into stomach without difficulty or delay. IMPRESSION: No definite abnormality seen in the esophagus. Electronically Signed   By: Rosalene Colon M.D.   On: 05/17/2023 11:19   US  Abdomen Complete Result Date: 05/17/2023 CLINICAL  DATA:  Epigastric pain and nausea. EXAM: ABDOMEN ULTRASOUND COMPLETE COMPARISON:  CT virtual colonoscopy May 09, 2022. FINDINGS: Gallbladder: Positive sonographic Murphy sign. Trace pericholecystic fluid. Gallbladder wall measures 3.5 mm. Sludge noted in the gallbladder. Common bile duct: Diameter: 4.3 mm. Liver: No focal lesion. Coarsened echotexture. Portal vein is patent on color Doppler imaging with normal direction of blood flow towards the liver. IVC: No abnormality visualized. Pancreas: Visualized portion unremarkable. Spleen: Size and appearance within normal limits. Right Kidney: Length: 11.1 cm. Echogenicity within normal limits. No mass or hydronephrosis visualized. Left Kidney: Length: 10.5 cm. Echogenicity within normal limits. No mass or hydronephrosis visualized. Abdominal aorta: No aneurysm visualized. Other findings: None. IMPRESSION: 1. Positive sonographic Murphy sign with trace pericholecystic fluid and gallbladder wall thickening. Sludge noted in the gallbladder. Findings are concerning for acute cholecystitis. 2. Coarsened echotexture of the liver. This is nonspecific but can be seen in cirrhosis. These results will be called to the ordering clinician or representative by the Radiologist Assistant, and communication documented in the PACS or [employer]. Electronically Signed   By: Anna Barnes M.D.   On: 05/17/2023 09:47    Labs: CBC    Component Value Date/Time   WBC 7.4 05/18/2023 1250   RBC 4.86 05/18/2023 1250   HGB 14.6 05/18/2023 1250   HGB 14.4 04/06/2022 0909   HCT 43.5 05/18/2023 1250   HCT 44.0 04/06/2022 0909   PLT 303.0 05/18/2023 1250   PLT 337 04/06/2022 0909   MCV 89.5 05/18/2023 1250   MCV 91 04/06/2022 0909   MCH 29.9 04/06/2022 0909   MCH 31.4 11/21/2017 1348   MCHC 33.5 05/18/2023 1250   RDW 13.8 05/18/2023 1250   RDW 13.0 04/06/2022 0909   LYMPHSABS 1.8 05/18/2023 1250   LYMPHSABS 1.4 04/06/2022 0909   MONOABS 0.5 05/18/2023 1250   EOSABS  0.1 05/18/2023 1250   EOSABS 0.1 04/06/2022 0909   BASOSABS 0.0 05/18/2023 1250   BASOSABS 0.0 04/06/2022 0909    CMP     Component Value Date/Time   NA 142 05/18/2023 1250   NA 142 11/07/2022 0957   K 4.0 05/18/2023 1250   CL 105 05/18/2023 1250   CO2 28 05/18/2023 1250   GLUCOSE 111 (H) 05/18/2023 1250   BUN 12 05/18/2023 1250   BUN 12 11/07/2022 0957   CREATININE 0.67 05/18/2023 1250   CALCIUM 9.3 05/18/2023 1250   PROT 7.4 05/18/2023 1250   PROT 6.8 11/07/2022 0957   ALBUMIN 4.8 05/18/2023 1250   ALBUMIN 4.8 11/07/2022 0957   AST 19 05/18/2023 1250   ALT 22 05/18/2023 1250   ALKPHOS 63 05/18/2023 1250   BILITOT 0.3 05/18/2023 1250   BILITOT 0.2 11/07/2022 0957   GFRNONAA 104 03/31/2019 1617   GFRAA 120 03/31/2019 1617       Assessment and Plan:   Samantha Church is a 63 y.o. y/o female  returns for 1 month follow-up of:  1.  Chronic constipation - Improved on Linzess  , however she still feels mildly constipated. - I gave her samples of Linzess  290 mcg once daily that she can try. - She will decide which dose of Linzess  she prefers (145 mcg or 290 mcg). - She can call back for prescription of Linzess  with refills for a year.  2.  GERD - Greatly improved and controlled on PPI.  Dysphagia and acid reflux have resolved.  Recent barium swallow test was normal. - Continue pantoprazole  40 Mg once daily, #90, 3 refills. - Continue GERD diet  3.  Epigastric and RUQ pain -resolved.  Recent RUQ ultrasound was concerning for gallbladder sludge, however follow-up abdominal MRI/MRCP was normal.  Recent CBC, CMP, and lipase labs were normal.  Patient has no more upper abdominal pain since her constipation improved on Linzess . - Follow-up if she has recurrent upper abdominal pain in the future. - Reassurance regarding normal abdominal MRI/MRCP.  4.  Colon cancer screening is up-to-date.  Negative CT virtual colonoscopy 04/2022.  Samantha Canard, PA-C  Follow up in 1 year to  refill medication or sooner if she develops recurrent GI symptoms.

## 2023-06-07 ENCOUNTER — Encounter: Payer: Self-pay | Admitting: Physician Assistant

## 2023-06-07 ENCOUNTER — Ambulatory Visit: Admitting: Physician Assistant

## 2023-06-07 VITALS — BP 108/60 | HR 66 | Ht 66.0 in | Wt 156.1 lb

## 2023-06-07 DIAGNOSIS — K219 Gastro-esophageal reflux disease without esophagitis: Secondary | ICD-10-CM

## 2023-06-07 DIAGNOSIS — K5909 Other constipation: Secondary | ICD-10-CM

## 2023-06-07 NOTE — Patient Instructions (Addendum)
 Continue Linzess   We have given you samples of Linzess  to try, if they work for you send Brigitte Canard a My Chart Message   Continue Pantoprazole  40 mg daily  Follow up in 1 year  _______________________________________________________  If your blood pressure at your visit was 140/90 or greater, please contact your primary care physician to follow up on this.  _______________________________________________________  If you are age 62 or older, your body mass index should be between 23-30. Your Body mass index is 25.2 kg/m. If this is out of the aforementioned range listed, please consider follow up with your Primary Care Provider.  If you are age 84 or younger, your body mass index should be between 19-25. Your Body mass index is 25.2 kg/m. If this is out of the aformentioned range listed, please consider follow up with your Primary Care Provider.   ________________________________________________________  The [facility] providers would like to encourage you to use MYCHART to communicate with providers for non-urgent requests or questions.  Due to long hold times on the telephone, sending your provider a message by [facility] may be a faster and more efficient way to get a response.  Please allow 48 business hours for a response.  Please remember that this is for non-urgent requests.  _______________________________________________________   I appreciate the  opportunity to care for you  Thank You   Tina Garrett,PA-C

## 2023-06-07 NOTE — Progress Notes (Signed)
 Agree with assessment and plan as outlined.

## 2023-06-15 MED ORDER — LINACLOTIDE 290 MCG PO CAPS: 290.0000 ug | ORAL_CAPSULE | Freq: Every day | ORAL | 1 refills | Status: DC

## 2023-07-20 ENCOUNTER — Ambulatory Visit: Payer: Self-pay

## 2023-07-20 ENCOUNTER — Encounter: Payer: Self-pay | Admitting: Family Medicine

## 2023-07-20 ENCOUNTER — Ambulatory Visit: Admitting: Family Medicine

## 2023-07-20 VITALS — BP 124/80 | HR 67 | Temp 98.3°F | Ht 66.0 in | Wt 155.2 lb

## 2023-07-20 DIAGNOSIS — J22 Unspecified acute lower respiratory infection: Secondary | ICD-10-CM | POA: Diagnosis not present

## 2023-07-20 DIAGNOSIS — R058 Other specified cough: Secondary | ICD-10-CM | POA: Diagnosis not present

## 2023-07-20 MED ORDER — AZITHROMYCIN 250 MG PO TABS: ORAL_TABLET | ORAL | 0 refills | Status: AC

## 2023-07-20 MED ORDER — PREDNISONE 10 MG PO TABS: 10.0000 mg | ORAL_TABLET | Freq: Two times a day (BID) | ORAL | 0 refills | Status: AC

## 2023-07-20 MED ORDER — HYDROCODONE BIT-HOMATROP MBR 5-1.5 MG/5ML PO SOLN: 5.0000 mL | Freq: Four times a day (QID) | ORAL | 0 refills | Status: DC | PRN

## 2023-07-20 MED ORDER — PROMETHAZINE-DM 6.25-15 MG/5ML PO SYRP: 5.0000 mL | ORAL_SOLUTION | Freq: Four times a day (QID) | ORAL | 0 refills | Status: AC | PRN

## 2023-07-20 NOTE — Telephone Encounter (Signed)
 FYI Only or Action Required?: FYI only for provider.  Patient was last seen in primary care on 03/07/2023 by Thedora Garnette HERO, MD.  Called Nurse Triage reporting Cough.  Symptoms began 8 days ago.  Interventions attempted: Rest, hydration, or home remedies.  Symptoms are: unchanged.  Triage Disposition: See HCP Within 4 Hours (Or PCP Triage)  Patient/caregiver understands and will follow disposition?: Yes  Copied from CRM [id number]. Topic: Clinical - Red Word Triage >> Jul 20, 2023  9:09 AM Adelita E wrote: [facility] that prompted transfer to Nurse Triage: Patient got back from a trip and has had a cough keeping her up at night, fever, and pain in her rib cage due to coughing so much. Reason for Disposition  [1] MILD difficulty breathing (e.g., minimal/no SOB at rest, SOB with walking, pulse < 100) AND [2] still present when not coughing  Answer Assessment - Initial Assessment Questions 1. ONSET: When did the cough begin?      Cough began 8 days ago. 2. SEVERITY: How bad is the cough today?      9 out of 10 in severity 3. SPUTUM: Describe the color of your sputum (e.g., none, dry cough; clear, white, yellow, green)     Dry hacking cough 4. HEMOPTYSIS: Are you coughing up any blood? If Yes, ask: How much? (e.g., flecks, streaks, tablespoons, etc.)     no 5. DIFFICULTY BREATHING: Are you having difficulty breathing? If Yes, ask: How bad is it? (e.g., mild, moderate, severe)      Mild 6. FEVER: Do you have a fever? If Yes, ask: What is your temperature, how was it measured, and when did it start?     Patient reports she hasn't taken her temperature but has been running warm per patient.  7. CARDIAC HISTORY: Do you have any history of heart disease? (e.g., heart attack, congestive heart failure)      no 8. LUNG HISTORY: Do you have any history of lung disease?  (e.g., pulmonary embolus, asthma, emphysema)     Chronic bronchitis as a child 9. PE RISK FACTORS:  Do you have a history of blood clots? (or: recent major surgery, recent prolonged travel, bedridden)     no 10. OTHER SYMPTOMS: Do you have any other symptoms? (e.g., runny nose, wheezing, chest pain)       Pain from coughing 12. TRAVEL: Have you traveled out of the country in the last month? (e.g., travel history, exposures)       Patient recently came home from the [place] on a cruise  Protocols used: Cough - Acute Non-Productive-A-AH

## 2023-07-20 NOTE — Progress Notes (Signed)
 Established Patient Office Visit   Subjective:  Patient ID: Samantha Church, female    DOB: 05/02/1960  Age: 63 y.o. MRN: [id number]  Chief Complaint  Patient presents with   Cough    Dry cough x 8 days. Pt complains of abdominal pain due to the excessive coughing.  No NV, fever, chills. Pt currently using otc tylenol . Pt Husband also has the cough. Was on a cruise when symptoms started.     Cough Associated symptoms include headaches. Pertinent negatives include no chills, eye redness, fever, myalgias, rash, shortness of breath or wheezing.   Encounter Diagnoses  Name Primary?   Lower resp. tract infection Yes   Post-viral cough syndrome    Presents with an ongoing history of a mostly dry cough without fevers chills nausea, wheezing or difficulty breathing.  No asthma or tobacco history.  This started shortly after a cruise that she had taken with her husband.  He was affected as well but is since recovered.  She has no history of asthma.  Cough is occasionally productive of green phlegm.  She feels some forehead pressure and abdominal pain with cough.  History of MS.   Review of Systems  Constitutional: Negative.  Negative for chills and fever.  HENT:  Positive for congestion.   Eyes:  Negative for blurred vision, discharge and redness.  Respiratory:  Positive for cough and sputum production. Negative for shortness of breath and wheezing.   Cardiovascular: Negative.   Gastrointestinal:  Negative for abdominal pain.  Genitourinary: Negative.   Musculoskeletal: Negative.  Negative for myalgias.  Skin:  Negative for rash.  Neurological:  Positive for headaches. Negative for tingling, loss of consciousness and weakness.  Endo/Heme/Allergies:  Negative for polydipsia.     Current Outpatient Medications:    azithromycin  (ZITHROMAX ) 250 MG tablet, Take 2 tablets on day 1, then 1 tablet daily on days 2 through 5, Disp: 6 tablet, Rfl: 0   Cholecalciferol (VITAMIN D -3) 5000 units TABS,  Take 1 tablet by mouth daily., Disp: , Rfl:    Cyanocobalamin (B-12) 3000 MCG LOZG, , Disp: , Rfl:    dalfampridine  10 MG TB12, One po q12 hours, Disp: 60 tablet, Rfl: 11   linaclotide  (LINZESS ) 290 MCG CAPS capsule, Take 1 capsule (290 mcg total) by mouth daily before breakfast., Disp: 90 capsule, Rfl: 1   Magnesium (OPTIMAG 125) 125 MG CAPS, Take 2 capsules by mouth daily., Disp: , Rfl:    Metamucil Fiber CHEW, Chew 1 each by mouth daily., Disp: , Rfl:    methylphenidate  (RITALIN ) 5 MG tablet, Take 1 tablet (5 mg total) by mouth 2 (two) times daily., Disp: 60 tablet, Rfl: 0   Multiple Vitamin (MULTIVITAMIN) tablet, Take 1 tablet by mouth daily., Disp: , Rfl:    Multiple Vitamins-Minerals (MULTIVITAMIN ADULT PO), Take by mouth., Disp: , Rfl:    NP THYROID  60 MG tablet, TAKE 1 TABLET BY MOUTH DAILY BEFORE BREAKFAST, Disp: 90 tablet, Rfl: 3   ocrelizumab  (OCREVUS ) 300 MG/10ML injection, INFUSE 600MG  ( ) INTRAVENOUSLY EVERY 6 MONTHS, Disp: 20 mL, Rfl: 1   pantoprazole  (PROTONIX ) 40 MG tablet, Take 1 tablet (40 mg total) by mouth daily., Disp: 30 tablet, Rfl: 2   predniSONE  (DELTASONE ) 10 MG tablet, Take 1 tablet (10 mg total) by mouth 2 (two) times daily with a meal for 7 days., Disp: 14 tablet, Rfl: 0   promethazine -dextromethorphan (PROMETHAZINE -DM) 6.25-15 MG/5ML syrup, Take 5 mLs by mouth 4 (four) times daily as needed., Disp: 118 mL,  Rfl: 0   solifenacin  (VESICARE ) 10 MG tablet, Take 1 tablet (10 mg total) by mouth daily., Disp: 30 tablet, Rfl: 11   Objective:     BP 124/80 (Cuff Size: Normal)   Pulse 67   Temp 98.3 F (36.8 C) (Oral)   Ht 5' 6 (1.676 m)   Wt 155 lb 3.2 oz (70.4 kg)   LMP 02/10/2015   SpO2 99%   BMI 25.05 kg/m    Physical Exam Constitutional:      General: She is not in acute distress.    Appearance: Normal appearance. She is not ill-appearing, toxic-appearing or diaphoretic.  HENT:     Head: Normocephalic and atraumatic.     Right Ear: Tympanic membrane,  ear canal and external ear normal.     Left Ear: Tympanic membrane, ear canal and external ear normal.     Mouth/Throat:     Mouth: Mucous membranes are moist.     Pharynx: Oropharynx is clear. No oropharyngeal exudate or posterior oropharyngeal erythema.  Eyes:     General: No scleral icterus.       Right eye: No discharge.        Left eye: No discharge.     Extraocular Movements: Extraocular movements intact.     Conjunctiva/sclera: Conjunctivae normal.     Pupils: Pupils are equal, round, and reactive to light.  Cardiovascular:     Rate and Rhythm: Normal rate and regular rhythm.  Pulmonary:     Effort: Pulmonary effort is normal. No respiratory distress.     Breath sounds: Normal breath sounds. No wheezing, rhonchi or rales.  Abdominal:     General: Bowel sounds are normal.  Musculoskeletal:     Cervical back: No rigidity or tenderness.  Lymphadenopathy:     Cervical: No cervical adenopathy.  Skin:    General: Skin is warm and dry.  Neurological:     Mental Status: She is alert and oriented to person, place, and time.  Psychiatric:        Mood and Affect: Mood normal.        Behavior: Behavior normal.      No results found for any visits on 07/20/23.    The 10-year ASCVD risk score (Arnett DK, et al., 2019) is: 3.8%    Assessment & Plan:   Lower resp. tract infection -     Azithromycin ; Take 2 tablets on day 1, then 1 tablet daily on days 2 through 5  Dispense: 6 tablet; Refill: 0  Post-viral cough syndrome -     predniSONE ; Take 1 tablet (10 mg total) by mouth 2 (two) times daily with a meal for 7 days.  Dispense: 14 tablet; Refill: 0 -     Promethazine -DM; Take 5 mLs by mouth 4 (four) times daily as needed.  Dispense: 118 mL; Refill: 0    Return if symptoms worsen or fail to improve.    Elsie Sim Lent, MD

## 2023-08-05 ENCOUNTER — Other Ambulatory Visit: Payer: Self-pay | Admitting: Physician Assistant

## 2023-08-05 DIAGNOSIS — K219 Gastro-esophageal reflux disease without esophagitis: Secondary | ICD-10-CM

## 2023-09-24 ENCOUNTER — Encounter: Payer: Self-pay | Admitting: Neurology

## 2023-10-01 ENCOUNTER — Other Ambulatory Visit: Payer: Self-pay | Admitting: *Deleted

## 2023-10-01 DIAGNOSIS — R29898 Other symptoms and signs involving the musculoskeletal system: Secondary | ICD-10-CM

## 2023-10-01 DIAGNOSIS — R269 Unspecified abnormalities of gait and mobility: Secondary | ICD-10-CM

## 2023-10-01 DIAGNOSIS — G35 Multiple sclerosis: Secondary | ICD-10-CM

## 2023-10-01 DIAGNOSIS — M21372 Foot drop, left foot: Secondary | ICD-10-CM

## 2023-10-30 ENCOUNTER — Telehealth: Payer: Self-pay | Admitting: *Deleted

## 2023-10-31 ENCOUNTER — Encounter: Payer: Self-pay | Admitting: Neurology

## 2023-10-31 ENCOUNTER — Ambulatory Visit: Admitting: Neurology

## 2023-10-31 ENCOUNTER — Telehealth: Payer: Self-pay | Admitting: Neurology

## 2023-10-31 VITALS — BP 135/85 | HR 66 | Ht 66.0 in | Wt 151.5 lb

## 2023-10-31 DIAGNOSIS — R29898 Other symptoms and signs involving the musculoskeletal system: Secondary | ICD-10-CM | POA: Diagnosis not present

## 2023-10-31 DIAGNOSIS — G35C1 Active secondary progressive multiple sclerosis: Secondary | ICD-10-CM | POA: Insufficient documentation

## 2023-10-31 DIAGNOSIS — R5383 Other fatigue: Secondary | ICD-10-CM

## 2023-10-31 DIAGNOSIS — R269 Unspecified abnormalities of gait and mobility: Secondary | ICD-10-CM

## 2023-10-31 DIAGNOSIS — Z79899 Other long term (current) drug therapy: Secondary | ICD-10-CM

## 2023-10-31 DIAGNOSIS — M21372 Foot drop, left foot: Secondary | ICD-10-CM | POA: Diagnosis not present

## 2023-10-31 NOTE — Telephone Encounter (Signed)
MRI order sent to [place] [phone]

## 2023-10-31 NOTE — Telephone Encounter (Signed)
 Pt has scheduled the needed f/u for this morning at 10:00, she is aware to arrive at 9:30 am

## 2023-10-31 NOTE — Progress Notes (Signed)
 [facility]  PATIENT: Samantha Church DOB: 11-Aug-1960  REFERRING DOCTOR OR PCP:  Vyvyan Sun SOURCE: Patient and records from [facility] neurology.  _________________________________   HISTORICAL  CHIEF COMPLAINT:  Chief Complaint  Patient presents with   Follow-up    Pt in room 10. Alone.Here for MS follow up    HISTORY OF PRESENT ILLNESS:  Samantha Church is a 63 year old woman with relapsing remitting multiple sclerosis diagnosed in 2007.    Update 10/31/2023 She had a relapse and started Ocrevus  in 2023.   Next infusion is mid November 2025.   She has tolerated it ok but feels fatigue x 24-36 hrs.   No exacerbations since starting.  She has had progressive difficulty with her gait.  She tires out easily while walking and now feels woped out after 100-150 feet.  Her balance is poor.   She does a little better with the AFO and uses 2 walking sticks. . She has a left foot drop - worse with longer distance.. One fall.   The  left leg has weakness, spasticity and reduced coordination.   Her left hand is slightly weak but coordinated.  Her right side is strong.   She is walking 1/4.      She uses the bannister on stairs. Dalfampridine  helps her some (noted doing worse a couple days off the medicaiton).    She has some spasticity. Tizanidine  had not helped.   Legs feel numb, left worse than right.  She has stable bladder urgency.    No recent UTI.   Solifenacin  has helped  Diplopia improved and nearly resolved. Samantha Church vision is symmetric   She is very fatigued currenly    Armodafinil  200 mad here jittery and did not help fatigue so she stopped  She has some sleepiness.   Sleep is not restorative so she takes a 2 hour nap with some benefit.  .   She sleeps 8 hours at night and then sleeps 2 hours most afternoons.The Home Sleep test showed AHI = 1.2 (normal).      Mood is doing about the same.  Cognition with mild brain  but she has reduced focus , especially when  tired    EPWORTH SLEEPINESS SCALE  On a scale of 0 - 3 what is the chance of dozing:  Sitting and Reading:   1 --- 2 if no nap Watching TV:    1 --- 2 if no nap Sitting inactive in a public place: 0 Passenger in car for one hour: 3 Lying down to rest in the afternoon: 3 Sitting and talking to someone: 0 Sitting quietly after lunch:  0 In a car, stopped in traffic:  0  Total (out of 24):   9/24 if she takes a scheduled nap.   11/24 if she does not take a nap  MS History:    She was diagnosed in 2007 after presenting with right sided numbness and mild clumsiness / allodynia.    MRI was consistent with MS and she was started with Betaseron.  She then switched to Copaxone but had exacerbations and switched to Tysabri.     She converted to Tysabri but became JCV Ab positive and switched to Gilenya.   Due to exacerbations on Gilenya, she switched back to Tysabri x one year and then switched to Tecfidera  (in 2014).   She switched to generic DMF in 2020.   After a relapse with diplopia in 2023 she started Ocrevus .  IMAGING MRI brain 04/08/2019 shows multiple T2/FLAIR hyperintense foci in the hemispheres, brainstem and cerebellum in a pattern configuration consistent with chronic demyelinating plaque associated with multiple sclerosis.  None of the foci appears to be acute and they do not enhance.  Compared to the MRI dated 12/24/2015, there are no new lesions.  MRI cervical spine 04/08/2019 shows T2 hyperintense foci within the spinal cord anteriorly to the right adjacent to C2, anterolaterally to the right adjacent to C3, laterally to the left adjacent to C3-C4 to C4, laterally to the left at C5, posterolaterally to the right at C6, and to the left at T1..   When compared to the MRI from 04/26/2016, there are no new lesions.  None of the foci enhance.  Mild degenerative disc changes and spondylosis at C3-C4, C4-C5 and C5-C6.  There is no nerve root compression or spinal stenosis.  This appears  fairly stable compared to the previous MRI.  MRI thoracic spine 04/08/2019 shows foci within the spinal cord at T8-T9 and T12 that were also present on the 06/02/2015 MRI.  These are consistent with demyelinating plaque associated with multiple sclerosis.    Mild degenerative changes at T7-T8 and T10-T11 that do not lead to nerve root compression  MRI of the brain 05/04/2021 showed T2/FLAIR hyperintense foci in the brainstem, cerebellum and hemispheres in a pattern consistent with chronic demyelinating plaque associated with multiple sclerosis. None of the foci enhance. However, one focus in the posterior pons on the right is new compared to the 2021 MRI and show subtle restricted diffusion. It is in a location that could cause diplopia    REVIEW OF SYSTEMS: Constitutional: No fevers, chills, sweats, or change in appetite.   She has fatigue Eyes: No visual changes, double vision, eye pain Ear, nose and throat: No hearing loss, ear pain, nasal congestion, sore throat Cardiovascular: No chest pain, palpitations Respiratory:  No shortness of breath at rest or with exertion.   No wheezes GastrointestinaI: No nausea, vomiting, diarrhea, abdominal pain, fecal incontinence Genitourinary:  No dysuria, urinary retention or frequency.  No nocturia. Musculoskeletal:  No neck pain, back pain Integumentary: No rash, pruritus, skin lesions Neurological: as above Psychiatric: No depression at this time.  No anxiety Endocrine: She has hypothyroidism.  No palpitations, diaphoresis, change in appetite, change in weigh or increased thirst Hematologic/Lymphatic:  No anemia, purpura, petechiae. Allergic/Immunologic: No itchy/runny eyes, nasal congestion, recent allergic reactions, rashes  ALLERGIES: No Known Allergies  HOME MEDICATIONS:  Current Outpatient Medications:    dalfampridine  10 MG TB12, One po q12 hours, Disp: 60 tablet, Rfl: 11   linaclotide  (LINZESS ) 290 MCG CAPS capsule, Take 1 capsule (290 mcg  total) by mouth daily before breakfast., Disp: 90 capsule, Rfl: 1   Magnesium (OPTIMAG 125) 125 MG CAPS, Take 2 capsules by mouth daily., Disp: , Rfl:    Metamucil Fiber CHEW, Chew 1 each by mouth daily., Disp: , Rfl:    Multiple Vitamin (MULTIVITAMIN) tablet, Take 1 tablet by mouth daily., Disp: , Rfl:    Multiple Vitamins-Minerals (MULTIVITAMIN ADULT PO), Take by mouth., Disp: , Rfl:    NP THYROID  60 MG tablet, TAKE 1 TABLET BY MOUTH DAILY BEFORE BREAKFAST, Disp: 90 tablet, Rfl: 3   ocrelizumab  (OCREVUS ) 300 MG/10ML injection, INFUSE 600MG  ( ) INTRAVENOUSLY EVERY 6 MONTHS, Disp: 20 mL, Rfl: 1   pantoprazole  (PROTONIX ) 40 MG tablet, TAKE 1 TABLET BY MOUTH DAILY, Disp: 90 tablet, Rfl: 2   promethazine -dextromethorphan (PROMETHAZINE -DM) 6.25-15 MG/5ML syrup, Take 5 mLs by  mouth 4 (four) times daily as needed., Disp: 118 mL, Rfl: 0   solifenacin  (VESICARE ) 10 MG tablet, Take 1 tablet (10 mg total) by mouth daily., Disp: 30 tablet, Rfl: 11   Cholecalciferol (VITAMIN D -3) 5000 units TABS, Take 1 tablet by mouth daily. (Patient not taking: Reported on 10/31/2023), Disp: , Rfl:    Cyanocobalamin (B-12) 3000 MCG LOZG, , Disp: , Rfl:    methylphenidate  (RITALIN ) 5 MG tablet, Take 1 tablet (5 mg total) by mouth 2 (two) times daily. (Patient not taking: Reported on 10/31/2023), Disp: 60 tablet, Rfl: 0  PAST MEDICAL HISTORY: Past Medical History:  Diagnosis Date   Anemia    Arthritis ?   sometimes in fingers   Cataract ?   left eye   GERD (gastroesophageal reflux disease) ?   hard to swallow/pills stuck in throat   Hortons syndrome    Hypothyroidism    Dr. Mirna   Lyme disease    Multiple sclerosis    Neuromuscular disorder (HCC)    MS   Vision abnormalities     PAST SURGICAL HISTORY: Past Surgical History:  Procedure Laterality Date   AUGMENTATION MAMMAPLASTY Bilateral 2003   saline   BREAST IMPLANT REMOVAL Bilateral 02/17/2019   BREAST SURGERY  2002   COLONOSCOPY  2008    DILATATION & CURRETTAGE/HYSTEROSCOPY WITH RESECTOCOPE N/A 10/09/2012   Procedure: DILATATION & CURETTAGE/HYSTEROSCOPY WITH RESECTOCOPE;  Surgeon: Truman Corona, MD;  Location: [facility];  Service: Gynecology;  Laterality: N/A;  RESECTION with Versapoint    KNEE ARTHROSCOPY  1996   LEG SURGERY  08/2022   TUBAL LIGATION  1996   UTERINE FIBROID SURGERY  2019   fibroidectomy    FAMILY HISTORY: Family History  Problem Relation Age of Onset   Alzheimer's disease Mother    Stroke Mother    Hypertension Mother    Arthritis Mother    Polycythemia Mother    Hearing loss Mother    Varicose Veins Mother    Colon cancer Father    Cancer Father    Thyroid  cancer Sister    Cancer Sister    Dementia Brother        Early   Asthma Brother    Thyroid  cancer Brother    Arthritis Brother    Cancer Brother    Cancer Paternal Aunt        Brain   Cancer Paternal Uncle        Brain   Heart attack Maternal Grandmother    Hypertension Maternal Grandmother    CAD Maternal Grandmother    Stroke Maternal Grandmother    Diabetes Maternal Grandfather    Lung cancer Maternal Grandfather    Cancer Maternal Grandfather    Alzheimer's disease Paternal Grandmother    Hypertension Paternal Grandmother    Hyperlipidemia Paternal Grandmother    Diabetes Paternal Grandfather    Cancer Paternal Aunt    Cancer Paternal Uncle     SOCIAL HISTORY:  Social History   Socioeconomic History   Marital status: Married    Spouse name: Not on file   Number of children: 4   Years of education: Not on file   Highest education level: Bachelor's degree (e.g., BA, AB, BS)  Occupational History   Occupation: Nutritional therapist    Comment: [employer]  Tobacco Use   Smoking status: Never   Smokeless tobacco: Never  Vaping Use   Vaping status: Never Used  Substance and Sexual Activity   Alcohol use: Not Currently  Comment: 2x month   Drug use: No   Sexual activity: Yes    Birth  control/protection: Other-see comments    Comment: Tubes Tied  Other Topics Concern   Not on file  Social History Narrative   Right Handed   2 Cups of Coffee per day   I/2 Cup of Tea    Social Drivers of Health   Financial Resource Strain: Low Risk  (03/05/2023)   Overall Financial Resource Strain (CARDIA)    Difficulty of Paying Living Expenses: Not hard at all  Food Insecurity: No Food Insecurity (03/05/2023)   Hunger Vital Sign    Worried About Running Out of Food in the Last Year: Never true    Ran Out of Food in the Last Year: Never true  Transportation Needs: No Transportation Needs (03/05/2023)   PRAPARE - Administrator, Civil Service (Medical): No    Lack of Transportation (Non-Medical): No  Physical Activity: Insufficiently Active (03/05/2023)   Exercise Vital Sign    Days of Exercise per Week: 1 day    Minutes of Exercise per Session: 20 min  Stress: No Stress Concern Present (03/05/2023)   [employer] of Occupational Health - Occupational Stress Questionnaire    Feeling of Stress : Not at all  Social Connections: Socially Isolated (03/05/2023)   Social Connection and Isolation Panel    Frequency of Communication with Friends and Family: Once a week    Frequency of Social Gatherings with Friends and Family: Once a week    Attends Religious Services: Never    Database administrator or Organizations: No    Attends Engineer, structural: Not on file    Marital Status: Married  Catering manager Violence: Not on file     PHYSICAL EXAM  Vitals:   10/31/23 0940  BP: 135/85  Pulse: 66  Weight: 151 lb 8 oz (68.7 kg)  Height: 5' 6 (1.676 m)     Body mass index is 24.45 kg/m.   General: The patient is well-developed and well-nourished and in no acute distress  Musculoskeletal:  Back is nontender  Neurologic Exam  Mental status: The patient is alert and oriented x 3 at the time of the examination. The patient has apparent normal recent  and remote memory, with an apparently normal attention span and concentration ability.   Speech is normal.  Cranial nerves: Extraocular muscles were intact today.   Either eye can read across the room and colors are symmetric..  Trapezius and sternocleidomastoid strength is normal. No dysarthria is noted.  Normal facial strength.  No obvious hearing deficits are noted.  Motor:  Muscle bulk is normal.   She has mildly increased muscle tone in the legs, left greater than right.. Strength is  5 / 5 in arms and right leg.   4+ to 5 left leg.  Reduced RAM in left hand but normal strength  Sensory: Sensory testing is intact to touch and vibration in the legs  Coordination: She has good finger-nose-finger bilaterally.  Heel-to-shin is reduced, worse on the left.  Gait and station: Station is normal.  The gait is wide.  She has a left foot drop.  She is able to walk within the room without the walking stick but cannot do a tandem walk.  Romberg was borderline.    Reflexes: Deep tendon reflexes are brisk bilaterally in the legs.  The deep tendon reflex spreadsat the knee, left greater than right.  No ankle clonus.  DIAGNOSTIC DATA (LABS, IMAGING, TESTING) - I reviewed patient records, labs, notes, testing and imaging myself where available.  Lab Results  Component Value Date   WBC 7.4 05/18/2023   HGB 14.6 05/18/2023   HCT 43.5 05/18/2023   MCV 89.5 05/18/2023   PLT 303.0 05/18/2023      Component Value Date/Time   NA 142 05/18/2023 1250   NA 142 11/07/2022 0957   K 4.0 05/18/2023 1250   CL 105 05/18/2023 1250   CO2 28 05/18/2023 1250   GLUCOSE 111 (H) 05/18/2023 1250   BUN 12 05/18/2023 1250   BUN 12 11/07/2022 0957   CREATININE 0.67 05/18/2023 1250   CALCIUM 9.3 05/18/2023 1250   PROT 7.4 05/18/2023 1250   PROT 6.8 11/07/2022 0957   ALBUMIN 4.8 05/18/2023 1250   ALBUMIN 4.8 11/07/2022 0957   AST 19 05/18/2023 1250   ALT 22 05/18/2023 1250   ALKPHOS 63 05/18/2023 1250    BILITOT 0.3 05/18/2023 1250   BILITOT 0.2 11/07/2022 0957   GFRNONAA 104 03/31/2019 1617   GFRAA 120 03/31/2019 1617      ASSESSMENT AND PLAN    1. Active secondary progressive multiple sclerosis   2. Gait disorder   3. Left foot drop   4. Left leg weakness   5. Other fatigue       1.   Continue Ocrevus .   Today, check lab work (IgG/IgM,CBC, hepatitis B).  We will check MRI to determine if breakthrough and consider a different DMT if occurring. . 2.   Gait initially improved with the AFO but now back to using 2 walking sticks.  3.   I recommended a trial of Ritalin  for MS related ADD and fatigue.   She was prescribed earlier but never started 4.   Continue Vesicare  for bladder urgency.     5.   Return in 6 months or sooner based on DMT decision or if there are new or worsening neurologic symptoms or based on the results of the MRI.    Shirline Kendle A. Vear, MD, PhD 10/31/2023, 10:10 AM Certified in Neurology, Clinical Neurophysiology, Sleep Medicine, Pain Medicine and Neuroimaging  [facility] [address] [place], [place] [address] [phone]

## 2023-11-01 ENCOUNTER — Ambulatory Visit: Payer: Self-pay | Admitting: Neurology

## 2023-11-01 ENCOUNTER — Encounter: Payer: Self-pay | Admitting: Neurology

## 2023-11-01 LAB — CBC WITH DIFFERENTIAL/PLATELET
Basophils Absolute: 0 x10E3/uL (ref 0.0–0.2)
Basos: 0 %
EOS (ABSOLUTE): 0 x10E3/uL (ref 0.0–0.4)
Eos: 0 %
Hematocrit: 44.6 % (ref 34.0–46.6)
Hemoglobin: 14.8 g/dL (ref 11.1–15.9)
Immature Grans (Abs): 0 x10E3/uL (ref 0.0–0.1)
Immature Granulocytes: 0 %
Lymphocytes Absolute: 1.3 x10E3/uL (ref 0.7–3.1)
Lymphs: 18 %
MCH: 30.1 pg (ref 26.6–33.0)
MCHC: 33.2 g/dL (ref 31.5–35.7)
MCV: 91 fL (ref 79–97)
Monocytes Absolute: 0.5 x10E3/uL (ref 0.1–0.9)
Monocytes: 6 %
Neutrophils Absolute: 5.3 x10E3/uL (ref 1.4–7.0)
Neutrophils: 76 %
Platelets: 225 x10E3/uL (ref 150–450)
RBC: 4.91 x10E6/uL (ref 3.77–5.28)
RDW: 13.3 % (ref 11.7–15.4)
WBC: 7.1 x10E3/uL (ref 3.4–10.8)

## 2023-11-01 LAB — IGG, IGA, IGM
IgA/Immunoglobulin A, Serum: 62 mg/dL — ABNORMAL LOW (ref 87–352)
IgG (Immunoglobin G), Serum: 798 mg/dL (ref 586–1602)
IgM (Immunoglobulin M), Srm: 61 mg/dL (ref 26–217)

## 2023-11-01 LAB — HEPATITIS B SURFACE ANTIGEN: Hepatitis B Surface Ag: NEGATIVE

## 2023-11-01 LAB — HEPATITIS B CORE ANTIBODY, TOTAL: Hep B Core Total Ab: NEGATIVE

## 2023-11-12 NOTE — Telephone Encounter (Signed)
 Pt also left a voicemail requesting order to be sent. I don't see where this was ever forwarded to [facility]. Printed MRI order and placed in pod 1 for Dr. Duncan signature.

## 2023-11-15 NOTE — Telephone Encounter (Signed)
 Received call from [facility] at [facility] asking if MRI order can be sent to them. Fax # [phone] ATTN: [facility]

## 2023-11-16 NOTE — Telephone Encounter (Signed)
MRI order faxed again

## 2023-12-01 ENCOUNTER — Other Ambulatory Visit: Payer: Self-pay | Admitting: Neurology

## 2023-12-14 ENCOUNTER — Other Ambulatory Visit: Payer: Self-pay | Admitting: Emergency Medicine

## 2023-12-14 DIAGNOSIS — K581 Irritable bowel syndrome with constipation: Secondary | ICD-10-CM

## 2023-12-14 DIAGNOSIS — K5909 Other constipation: Secondary | ICD-10-CM

## 2023-12-14 MED ORDER — LINACLOTIDE 290 MCG PO CAPS: 290.0000 ug | ORAL_CAPSULE | Freq: Every day | ORAL | 1 refills | Status: AC

## 2023-12-14 NOTE — Progress Notes (Signed)
 Pt called and stated that her insurance has been changed so we will need to change her Pharmacy.

## 2023-12-27 ENCOUNTER — Ambulatory Visit: Admitting: Neurology

## 2024-01-23 ENCOUNTER — Encounter: Payer: Self-pay | Admitting: Neurology

## 2024-01-29 ENCOUNTER — Other Ambulatory Visit: Payer: Self-pay | Admitting: Neurology

## 2024-01-29 NOTE — Telephone Encounter (Signed)
 Last seen on 10/31/23 Follow up scheduled on 05/29/24

## 2024-05-20 ENCOUNTER — Ambulatory Visit: Admitting: Internal Medicine

## 2024-05-29 ENCOUNTER — Ambulatory Visit: Admitting: Neurology
